# Patient Record
Sex: Female | Born: 1955 | Race: White | Hispanic: No
Health system: Midwestern US, Community
[De-identification: ages and names within clinical notes are randomized; demographics above are authoritative.]

## PROBLEM LIST (undated history)

## (undated) DIAGNOSIS — Z8711 Personal history of peptic ulcer disease: Secondary | ICD-10-CM

## (undated) DIAGNOSIS — K651 Peritoneal abscess: Secondary | ICD-10-CM

## (undated) DIAGNOSIS — E1165 Type 2 diabetes mellitus with hyperglycemia: Secondary | ICD-10-CM

## (undated) DIAGNOSIS — L299 Pruritus, unspecified: Secondary | ICD-10-CM

## (undated) DIAGNOSIS — Z1239 Encounter for other screening for malignant neoplasm of breast: Secondary | ICD-10-CM

## (undated) DIAGNOSIS — E7800 Pure hypercholesterolemia, unspecified: Secondary | ICD-10-CM

## (undated) DIAGNOSIS — I152 Hypertension secondary to endocrine disorders: Secondary | ICD-10-CM

## (undated) DIAGNOSIS — M25562 Pain in left knee: Secondary | ICD-10-CM

## (undated) DIAGNOSIS — E1159 Type 2 diabetes mellitus with other circulatory complications: Secondary | ICD-10-CM

## (undated) DIAGNOSIS — Z794 Long term (current) use of insulin: Secondary | ICD-10-CM

## (undated) DIAGNOSIS — M1A0720 Idiopathic chronic gout, left ankle and foot, without tophus (tophi): Secondary | ICD-10-CM

## (undated) DIAGNOSIS — Z09 Encounter for follow-up examination after completed treatment for conditions other than malignant neoplasm: Secondary | ICD-10-CM

## (undated) DIAGNOSIS — M25561 Pain in right knee: Secondary | ICD-10-CM

## (undated) DIAGNOSIS — H6692 Otitis media, unspecified, left ear: Secondary | ICD-10-CM

## (undated) DIAGNOSIS — T8149XA Infection following a procedure, other surgical site, initial encounter: Secondary | ICD-10-CM

## (undated) DIAGNOSIS — Z1211 Encounter for screening for malignant neoplasm of colon: Principal | ICD-10-CM

## (undated) DIAGNOSIS — T7840XA Allergy, unspecified, initial encounter: Secondary | ICD-10-CM

## (undated) DIAGNOSIS — R109 Unspecified abdominal pain: Secondary | ICD-10-CM

## (undated) DIAGNOSIS — Z1231 Encounter for screening mammogram for malignant neoplasm of breast: Principal | ICD-10-CM

## (undated) MED ORDER — INSULIN NEEDLES (DISPOSABLE) 31 X 5/16"
31 gauge x 5/16" | PACK | Freq: Every day | Status: DC
Start: ? — End: 2017-05-11

## (undated) MED ORDER — TRIAMTERENE-HYDROCHLOROTHIAZIDE 37.5 MG-25 MG TAB
ORAL_TABLET | ORAL | Status: DC
Start: ? — End: 2013-06-22

## (undated) MED ORDER — LOSARTAN 50 MG TAB
50 mg | ORAL_TABLET | ORAL | Status: DC
Start: ? — End: 2013-10-13

## (undated) MED ORDER — HUMULIN N NPH U-100 INSULIN KWIKPEN 100 UNIT/ML (3 ML) SUBCUTANEOUS
100 unit/mL (3 mL) | PACK | SUBCUTANEOUS | Status: DC
Start: ? — End: 2013-11-25

## (undated) MED ORDER — FUROSEMIDE 40 MG TAB
40 mg | ORAL_TABLET | ORAL | Status: DC
Start: ? — End: 2016-10-29

## (undated) MED ORDER — HUMULIN N PEN 100 UNIT/ML (3 ML) SUBCUTANEOUS
100 unit/mL (3 mL) | INJECTION | SUBCUTANEOUS | Status: DC
Start: ? — End: 2012-08-05

## (undated) MED ORDER — LIDOCAINE 5 % (700 MG/PATCH) ADHESIVE PATCH
5 % | PACK | Freq: Two times a day (BID) | CUTANEOUS | Status: DC
Start: ? — End: 2017-05-11

## (undated) MED ORDER — SERTRALINE 100 MG TAB
100 mg | ORAL_TABLET | ORAL | Status: DC
Start: ? — End: 2013-09-30

## (undated) MED ORDER — CYMBALTA 60 MG CAPSULE,DELAYED RELEASE
60 mg | ORAL_CAPSULE | ORAL | Status: DC
Start: ? — End: 2013-01-24

## (undated) MED ORDER — SIMVASTATIN 40 MG TAB
40 mg | ORAL_TABLET | Freq: Every evening | ORAL | Status: DC
Start: ? — End: 2014-06-21

## (undated) MED ORDER — HUMULIN N PEN 100 UNIT/ML (3 ML) SUBCUTANEOUS
100 unit/mL (3 mL) | INJECTION | SUBCUTANEOUS | Status: DC
Start: ? — End: 2012-06-14

## (undated) MED ORDER — METFORMIN SR 500 MG 24 HR TABLET
500 mg | ORAL_TABLET | ORAL | Status: DC
Start: ? — End: 2012-06-27

## (undated) MED ORDER — HYDROXYZINE 50 MG TAB
50 mg | ORAL_TABLET | Freq: Four times a day (QID) | ORAL | Status: AC | PRN
Start: ? — End: 2012-12-01

## (undated) MED ORDER — ONETOUCH ULTRA TEST STRIPS
PACK | Status: DC
Start: ? — End: 2017-05-11

## (undated) MED ORDER — METFORMIN SR 500 MG 24 HR TABLET
500 mg | ORAL_TABLET | ORAL | Status: DC
Start: ? — End: 2014-12-11

## (undated) MED ORDER — HUMULIN N PEN 100 UNIT/ML (3 ML) SUBCUTANEOUS
100 unit/mL (3 mL) | INJECTION | SUBCUTANEOUS | Status: DC
Start: ? — End: 2013-06-05

## (undated) MED ORDER — LIDOCAINE (PF) 10 MG/ML (1 %) IJ SOLN
10 mg/mL (1 %) | Freq: Once | INTRAMUSCULAR | Status: AC
Start: ? — End: 2013-05-02

## (undated) MED ORDER — TRIAMTERENE-HYDROCHLOROTHIAZIDE 37.5 MG-25 MG TAB
ORAL_TABLET | ORAL | Status: DC
Start: ? — End: 2013-12-05

## (undated) MED ORDER — FUROSEMIDE 40 MG TAB
40 mg | ORAL_TABLET | ORAL | Status: DC
Start: ? — End: 2013-05-07

## (undated) MED ORDER — SERTRALINE 100 MG TAB
100 mg | ORAL_TABLET | Freq: Every day | ORAL | Status: DC
Start: ? — End: 2013-05-28

## (undated) MED ORDER — SERTRALINE 100 MG TAB
100 mg | ORAL_TABLET | ORAL | Status: DC
Start: ? — End: 2014-05-08

## (undated) MED ORDER — METFORMIN SR 500 MG 24 HR TABLET
500 mg | ORAL_TABLET | ORAL | Status: DC
Start: ? — End: 2013-06-22

---

## 2010-02-13 NOTE — Telephone Encounter (Signed)
Pt calls with sl.sore throat, cough-nonproductive wanted abx b/c she  Thinks she has what her partner has.  She has no chest pain sob or fever.  I rec- otc meds for nex 24 hrs to see if she gets better.

## 2010-02-13 NOTE — Telephone Encounter (Signed)
Please call in meds   She can best be reach at [phone]

## 2010-02-13 NOTE — Telephone Encounter (Signed)
Pt has same symptoms as diane hoover. Cough, congestion head etc .she wants med.  DOB0 Jan 19, 2056  [id number] it is the one on [address].

## 2010-08-17 NOTE — Telephone Encounter (Signed)
Message copied by Sudie Grumbling on Mon Aug 17, 2010 10:56 AM  ------       Message from: Sabino Donovan       Created: Mon Aug 17, 2010  8:56 AM       Regarding: speak to nurse       Contact: [phone]         AHMAYA OSTERMILLER is calling to speak to nurse.        # [phone]

## 2010-08-17 NOTE — Telephone Encounter (Signed)
Spoke with patient she will come in this morning

## 2010-08-19 MED ORDER — LIDOCAINE HCL 1 % (10 MG/ML) IJ SOLN
10 mg/mL (1 %) | Freq: Once | INTRAMUSCULAR | Status: AC
Start: 2010-08-19 — End: 2010-08-19

## 2010-08-19 NOTE — Progress Notes (Signed)
Dana [name] is a 54 y.o. female and presents with Chief Complaint   Patient presents with   ??? Wrist Pain     .started having wrist pain a few months ago- does not want to see ortho or do surgery. Also has sinus infection-         Current outpatient prescriptions   Medication Sig Dispense Refill   ??? losartan (COZAAR) 50 mg tablet Take  by mouth daily.       ??? simvastatin (ZOCOR) 20 mg tablet Take  by mouth nightly.       ??? triamterene-hydrochlorothiazide (DYAZIDE) 37.5-25 mg per capsule Take  by mouth daily.       ??? sertraline (ZOLOFT) 100 mg tablet Take 150 mg by mouth daily.       ??? potassium chloride SR (KLOR-CON 10) 10 mEq tablet Take 20 mEq by mouth two (2) times a day.       ??? lidocaine (XYLOCAINE) 10 mg/mL (1 %) injection 1 mL by Intra artICUlar route once for 1 dose.  1 mL  0       Allergies not on file  Past Medical History   Diagnosis Date   ??? HTN (hypertension)    ??? Hypercholesterolemia    ??? GERD (gastroesophageal reflux disease)      Past Surgical History   Procedure Date   ??? Hx hysterectomy      No family history on file.  History   Substance Use Topics   ??? Smoking status: Not on file   ??? Smokeless tobacco: Not on file   ??? Alcohol Use: Not on file            Objective:  There were no vitals taken for this visit.  wdwn 54 yo wf  In NAD. A&O.  HEENT -- Pupils round.  O/P Clear.  Neck -- Supple. No JVD.  Heart -- RRR. No R/M/G.  Lungs -- coarse breath sounds  Abdomen -- Soft. Non-tender. Non-distended. No masses. Bowel sounds present.  Extremeties -- No edema.    Right wrist + tinel's tender over carpal tunnel    Assessment/Plan:  1. Carpal tunnel syndrome (354.0)  TRIAMCINOLONE ACETONIDE INJ, lidocaine (XYLOCAINE) 10 mg/mL (1 %) injection, DRAIN/INJECT SMALL JOINT/BURSA   2. Sinusitis (473.9G)             Author:  Lake Bells, MD 08/20/2010 8:25 AM   Indications:   Diagnostic    Procedure:   After consent was obtained, using sterile technique the right wrist joint was prepped using Betadine. Local anesthetic used: 1% lidocaine.. The joint was entered     Kenalog 15 mg was mixed with 1% lidocaine 1/2 ml  and injected into the joint and the needle withdrawn.  The procedure was well tolerated.  The patient is asked to continue to rest the joint for a few more days before resuming regular activities.  It may be more painful for the first 1-2 days.  Watch for fever, or increased swelling or persistent pain in the joint. Call or return to clinic prn if such symptoms occur or there is failure to improve as anticipated.

## 2010-09-10 LAB — AMB POC COMPLETE CBC,AUTOMATED ENTER
ABS. LYMPHS (POC): 1.7 10*3/uL (ref 1.2–3.4)
HCT (POC): 43 % (ref 35.0–60.0)
HGB (POC): 14.4 g/dL (ref 12.0–16.0)
LYMPHOCYTES (POC): 28.1 % (ref 20.5–51.1)
MCH (POC): 32.1 pg — AB (ref 27.0–31.0)
MCHC (POC): 33.6 g/dL (ref 33.0–37.0)
MCV (POC): 95.6 fL (ref 80.0–99.9)
PLATELET (POC): 223 10*3/uL (ref 150–450)
RBC (POC): 4.5 M/uL (ref 4.00–6.00)
WBC (POC): 6.2 10*3/uL (ref 4.5–10.5)

## 2010-09-10 LAB — AMB POC URINALYSIS DIP STICK AUTO W/ MICRO
Bilirubin (UA POC): NEGATIVE
Glucose (UA POC): NEGATIVE
Ketones (UA POC): NEGATIVE
Leukocyte esterase (UA POC): NEGATIVE
Nitrites (UA POC): NEGATIVE
Protein (UA POC): NEGATIVE mg/dL
Specific gravity (UA POC): 1.02 (ref 1.001–1.035)
Urobilinogen (UA POC): 0.2
pH (UA POC): 6 (ref 4.6–8.0)

## 2010-09-10 MED ORDER — AZITHROMYCIN 250 MG TAB
250 mg | ORAL_TABLET | ORAL | Status: AC
Start: 2010-09-10 — End: 2010-09-15

## 2010-09-10 NOTE — Patient Instructions (Signed)
MyChart Activation    Thank you for requesting access to MyChart. Please follow the instructions below to securely access and download your online medical record. MyChart allows you to send messages to your doctor, view your test results, renew your prescriptions, schedule appointments, and more.    How Do I Sign Up?    1. In your internet browser, go to [email or web address].  2. Click on the First Time User? Click Here link in the Sign In box. You will see the New Member Sign Up page.  3. Enter your MyChart Access Code exactly as it appears below. You will not need to use this code after you???ve completed the sign-up process. If you do not sign up before the expiration date, you must request a new code.    MyChart Access Code: [email or web address]  Expires: 12/09/10 10:49 AM (This is the date your MyChart access code will expire)    4. Enter the last four digits of your Social Security Number (xxxx) and Date of Birth (mm/dd/yyyy) as indicated and click Submit. You will be taken to the next sign-up page.  5. Create a MyChart ID. This will be your MyChart login ID and cannot be changed, so think of one that is secure and easy to remember.  6. Create a MyChart password. You can change your password at any time.  7. Enter your Password Reset Question and Answer. This can be used at a later time if you forget your password.   8. Enter your e-mail address. You will receive e-mail notification when new information is available in MyChart.  9. Click Sign Up. You can now view and download portions of your medical record.  10. Click the Download Summary menu link to download a portable copy of your medical information.    Additional Information    If you have questions, please call [phone]. Remember, MyChart is NOT to be used for urgent needs. For medical emergencies, dial 911.      Results for orders placed in visit on 09/10/10   AMB POC COMPLETE CBC,AUTOMATED ENTER   Component Value Range    ??? WBC COUNT 6.2  4.5 - 10.5 (K/uL)   ??? RBC 4.50  4.00 - 6.00 (M/uL)   ??? HGB 14.4  12.0 - 16.0 (g/dL)   ??? HCT 43.0  35.0 - 60.0 (%)   ??? MCV 95.6  80.0 - 99.9 (fL)   ??? MCH 32.1 (*) 27.0 - 31.0 (pg)   ??? MCHC 33.6  33.0 - 37.0 (g/dL)   ??? PLATELET 223  150 - 450 (K/uL)   ??? LYMPHOCYTE % 28.1  20.5 - 51.1 (%)   ??? LYMPHOCYTE # 1.7  1.2 - 3.4 (K/uL)   AMB POC URINALYSIS DIP STICK AUTO W/ MICRO   Component Value Range   ??? Color Yellow     ??? Clarity Clear     ??? Glucose Negative   > Negative    ??? Bilirubin Negative   > Negative    ??? Ketones Negative   > Negative    ??? Spec.Grav. 1.020  1.001 - 1.035    ??? Blood 2+   > Negative    ??? pH 6.0  4.6 - 8.0    ??? Protein neg   > Negative (mg/dL)   ??? Urobilinogen 0.2 mg/dL     ??? Nitrites Negative   > Negative    ??? Leukocyte esterase Negative   > Negative

## 2010-09-10 NOTE — Progress Notes (Signed)
History and Physical    Dana [name] is a 55 y.o. female presents for physical exam.  Back of neck very sore,- even cold air  Hurts it.  Right ear hurting, feels snotty    Past Medical History   Diagnosis Date   ??? HTN (hypertension)    ??? Hypercholesterolemia    ??? GERD (gastroesophageal reflux disease)        Past Surgical History   Procedure Date   ??? Hx hysterectomy        Current outpatient prescriptions   Medication Sig Dispense Refill   ??? CITALOPRAM HYDROBROMIDE (CELEXA PO) Take  by mouth.       ??? potassium chloride SA (MICRO-K) 10 mEq capsule Take 10 mEq by mouth daily.       ??? azithromycin (ZITHROMAX) 250 mg tablet Take  by mouth for 5 days. Take two tablets today then one tablet daily  6 Tab  0   ??? losartan (COZAAR) 50 mg tablet Take  by mouth daily.       ??? simvastatin (ZOCOR) 20 mg tablet Take  by mouth nightly.       ??? triamterene-hydrochlorothiazide (DYAZIDE) 37.5-25 mg per capsule Take  by mouth daily.       ??? sertraline (ZOLOFT) 100 mg tablet Take 150 mg by mouth daily.           Allergies   Allergen Reactions   ??? Dolobid (Diflunisal) Itching     Itching,swelling   ??? Adhesive Itching       History   Social History   ??? Marital Status: Single     Spouse Name: N/A     Number of Children: N/A   ??? Years of Education: N/A   Social History Main Topics   ??? Smoking status: Current Everyday Smoker   ??? Smokeless tobacco: Never Used   ??? Alcohol Use: Not on file   ??? Drug Use: Not on file   ??? Sexually Active: Not on file   Other Topics Concern   ??? Not on file   Social History Narrative   ??? No narrative on file       No family history on file.    Review of Systems:  Denies dysphagia, chest pain, or SOB.  No nausea or vomiting, or diarrhea or constipation.  No fever, chills, night sweats, or cough.  No dsyuria, frequency or abdominal pain.  No headaches.  Mamm? Colon-done by Dr. Christy Gentles . Pap done by gyn    Yearly eye exam:   yes  Yearly dental exam: yes    Objective   BP 120/80   Ht 5' 4.5" (1.638 m)   Wt 250 lb (113.399 kg)   BMI 42.25 kg/m2  General appearance - well developed, well nourished 55 y.o.wf- nad  Eyes - PERRL rt tm occluded,left intact  Neck --Supple, no anterior cervical nodes, no thyromegaly.no bruits, lateral neck muscles tender on palpaiton  Lungs --CTA  Heart - Regular rate and rhytm  Abdomen - soft, no tenderness or distention  Breasts - no masses  Extremities - no clubbing, cyanosis or edema  Skin- seborrheic keratosis rt breast    Results for orders placed in visit on 09/10/10   AMB POC COMPLETE CBC,AUTOMATED ENTER   Component Value Range   ??? WBC COUNT 6.2  4.5 - 10.5 (K/uL)   ??? RBC 4.50  4.00 - 6.00 (M/uL)   ??? HGB 14.4  12.0 - 16.0 (g/dL)   ??? HCT 43.0  35.0 - 60.0 (%)   ??? MCV 95.6  80.0 - 99.9 (fL)   ??? MCH 32.1 (*) 27.0 - 31.0 (pg)   ??? MCHC 33.6  33.0 - 37.0 (g/dL)   ??? PLATELET 223  150 - 450 (K/uL)   ??? LYMPHOCYTE % 28.1  20.5 - 51.1 (%)   ??? LYMPHOCYTE # 1.7  1.2 - 3.4 (K/uL)   AMB POC URINALYSIS DIP STICK AUTO W/ MICRO   Component Value Range   ??? Color Yellow     ??? Clarity Clear     ??? Glucose Negative   > Negative    ??? Bilirubin Negative   > Negative    ??? Ketones Negative   > Negative    ??? Spec.Grav. 1.020  1.001 - 1.035    ??? Blood 2+   > Negative    ??? pH 6.0  4.6 - 8.0    ??? Protein neg   > Negative (mg/dL)   ??? Urobilinogen 0.2 mg/dL     ??? Nitrites Negative   > Negative    ??? Leukocyte esterase Negative   > Negative          Assessment/Plan    1. Routine general medical examination at a health care facility (V70.0)  XR CHEST PA AND LATERAL, AMB POC COMPLETE CBC,AUTOMATED ENTER, LIPID PANEL, METABOLIC PANEL, COMPREHENSIVE, TSH, 3RD GENERATION, INFLUENZA VIRUS VACCINE, SPLIT, IN INDIVIDS. >=3 YRS OF AGE, IM, AMB POC URINALYSIS DIP STICK AUTO W/ MICRO   2. Hematuria (599.36F)  CULTURE, URINE   3. Sinusitis (473.9G)  azithromycin (ZITHROMAX) 250 mg tablet    4. Evaluation of hearing impairment (V72.19V)  REFERRAL TO ENT-OTOLARYNGOLOGY, PR PURE TONE HEARING TEST, AIR     Stop celexa- start cymbalta -given samples of 30 and 60 mg. Start 30 mg increase to bid then take 60 ,mg  Hearing eval  If urine culture neg- send to urology for eval for hematuria  Pt given zpack to take if not better try allegra and mucinex     AUTHOR:  Lake Bells, MD 09/10/2010 9:55 AM

## 2010-09-12 LAB — CULTURE, URINE
RESULT 1, 997131: NO GROWTH
Result 1: NO GROWTH

## 2010-09-14 LAB — REQUEST PROBLEM

## 2010-09-14 LAB — METABOLIC PANEL, COMPREHENSIVE

## 2010-09-14 LAB — LIPID PANEL

## 2010-09-14 LAB — TSH 3RD GENERATION: TSH: 1.5 u[IU]/mL (ref 0.450–4.500)

## 2010-10-01 MED ORDER — DULOXETINE 60 MG CAP, DELAYED RELEASE
60 mg | ORAL_CAPSULE | Freq: Every day | ORAL | Status: DC
Start: 2010-10-01 — End: 2011-05-30

## 2010-10-01 NOTE — Telephone Encounter (Signed)
Dana [name]  called and needs a new  prescription for Cymbalta 60 mg # thirty sig one a day with refills. Pharmacy is [facility] located @ [address] # is [phone].  Stated it works-was switched from Celexa. Her ph # is [phone].

## 2010-10-01 NOTE — Telephone Encounter (Signed)
E-Escript Cymbalta to Pharmacy for patient

## 2010-11-02 NOTE — Telephone Encounter (Signed)
STARLEEN TRUSSELL would like an appt for an URI.  # [phone]

## 2010-11-02 NOTE — Telephone Encounter (Signed)
Wants an app for URI given 2;30 11/03/10

## 2010-11-03 LAB — AMB POC COMPLETE CBC,AUTOMATED ENTER
ABS. LYMPHS (POC): 1.7 10*3/uL (ref 1.2–3.4)
HCT (POC): 46.4 % (ref 35.0–60.0)
HGB (POC): 15.7 g/dL (ref 12.0–16.0)
LYMPHOCYTES (POC): 31.2 % (ref 20.5–51.1)
MCH (POC): 32.3 pg — AB (ref 27.0–31.0)
MCHC (POC): 33.8 g/dL (ref 33.0–37.0)
MCV (POC): 95.6 fL (ref 80.0–99.9)
PLATELET (POC): 199 10*3/uL (ref 150–450)
RBC (POC): 4.85 M/uL (ref 4.00–6.00)
WBC (POC): 5.4 10*3/uL (ref 4.5–10.5)

## 2010-11-03 MED ORDER — HYDROCODONE 10 MG-CHLORPHENIRAMINE 8 MG/5 ML ORAL SUSP EXTEND.REL 12HR
10-8 mg/5 mL | Freq: Two times a day (BID) | ORAL | Status: DC | PRN
Start: 2010-11-03 — End: 2013-01-24

## 2010-11-03 MED ORDER — AZITHROMYCIN 250 MG TAB
250 mg | ORAL_TABLET | ORAL | Status: AC
Start: 2010-11-03 — End: 2010-11-08

## 2010-11-03 NOTE — Patient Instructions (Signed)
MyChart Activation    Thank you for requesting access to MyChart. Please follow the instructions below to securely access and download your online medical record. MyChart allows you to send messages to your doctor, view your test results, renew your prescriptions, schedule appointments, and more.    How Do I Sign Up?    1. In your internet browser, go to [email or web address]  2. Click on the First Time User? Click Here link in the Sign In box. You will be redirect to the New Member Sign Up page.  3. Enter your MyChart Access Code exactly as it appears below. You will not need to use this code after you???ve completed the sign-up process. If you do not sign up before the expiration date, you must request a new code.    MyChart Access Code: [email or web address]  Expires: 12/09/2010 10:49 AM (This is the date your MyChart access code will expire)    4. Enter the last four digits of your Social Security Number (xxxx) and Date of Birth (mm/dd/yyyy) as indicated and click Submit. You will be taken to the next sign-up page.  5. Create a MyChart ID. This will be your MyChart login ID and cannot be changed, so think of one that is secure and easy to remember.  6. Create a MyChart password. You can change your password at any time.  7. Enter your Password Reset Question and Answer. This can be used at a later time if you forget your password.   8. Enter your e-mail address. You will receive e-mail notification when new information is available in MyChart.  9. Click Sign Up. You can now view and download portions of your medical record.  10. Click the Download Summary menu link to download a portable copy of your medical information.    Additional Information    If you have questions, please call [phone]. Remember, MyChart is NOT to be used for urgent needs. For medical emergencies, dial 911.      mucinex twice a day,  tussionex at night  Jet nebs every 4 hours while awake  Call if worse  zpack

## 2010-11-03 NOTE — Progress Notes (Signed)
Dana [name] is a 55 y.o. female and presents with   Chief Complaint   Patient presents with   ??? Sinus Infection   .went to concert in [place] on weekend, started feeling badly sun pm. Cough prod of phelgm.  No chest pain or sob. Temp up to? Sweats.  Scratchy throat.feels bad.  Coughing so she can not sleep      Current Outpatient Prescriptions   Medication Sig Dispense Refill   ??? Cholecalciferol, Vitamin D3, (VITAMIN D) 5,000 unit Tab Take  by mouth.         ??? azithromycin (ZITHROMAX) 250 mg tablet Take two tablets today then one tablet daily  6 Tab  0   ??? chlorpheniramine-HYDROcodone (TUSSIONEX) 8-10 mg/5 mL suspension Take 5 mL by mouth every twelve (12) hours as needed for Cough.  120 mL  0   ??? DULoxetine (CYMBALTA) 60 mg capsule Take 1 Cap by mouth daily for 90 days.  30 Cap  6   ??? potassium chloride SA (MICRO-K) 10 mEq capsule Take 10 mEq by mouth daily.       ??? losartan (COZAAR) 50 mg tablet Take  by mouth daily.       ??? simvastatin (ZOCOR) 20 mg tablet Take  by mouth nightly.       ??? triamterene-hydrochlorothiazide (DYAZIDE) 37.5-25 mg per capsule Take  by mouth daily.         Allergies   Allergen Reactions   ??? Adhesive Itching   ??? Dolobid (Diflunisal) Itching     Itching,swelling     Past Medical History   Diagnosis Date   ??? HTN (hypertension)    ??? Hypercholesterolemia    ??? GERD (gastroesophageal reflux disease)      Past Surgical History   Procedure Date   ??? Hx hysterectomy      No family history on file.  History   Substance Use Topics   ??? Smoking status: Current Everyday Smoker   ??? Smokeless tobacco: Never Used   ??? Alcohol Use: Not on file          Objective:  BP 90/60   Temp 97.9 ??F (36.6 ??C)   Ht 5' 4.5" (1.638 m)   Wt 250 lb (113.399 kg)   BMI 42.25 kg/m2  wdwn 55 yo wf-  In NAD. A&O.  HEENT -- Pupils round.  O/P Clear.tm- occluded with cerumen  Neck -- Supple. No JVD.no acn  Heart -- RRR. No R/M/G.  Lungs -- Coarse bs and wheezing   Abdomen -- Soft. Non-tender. Non-distended. No masses. Bowel sounds present.  Extremeties -- No edema.      Results for orders placed in visit on 11/03/10   AMB POC COMPLETE CBC,AUTOMATED ENTER       Component Value Range    WBC COUNT 5.4  4.5 - 10.5 (K/uL)    RBC 4.85  4.00 - 6.00 (M/uL)    HGB 15.7  12.0 - 16.0 (g/dL)    HCT 16.1  09.6 - 04.5 (%)    MCV 95.6  80.0 - 99.9 (fL)    MCH 32.3 (*) 27.0 - 31.0 (pg)    MCHC 33.8  33.0 - 37.0 (g/dL)    PLATELET 409  811 - 450 (K/uL)    LYMPHOCYTE % 31.2  20.5 - 51.1 (%)    LYMPHOCYTE # 1.7  1.2 - 3.4 (K/uL)       Assessment/Plan:  1. Sinus infection  AMB POC COMPLETE CBC,AUTOMATED ENTER, azithromycin (ZITHROMAX) 250  mg tablet, chlorpheniramine-HYDROcodone (TUSSIONEX) 8-10 mg/5 mL suspension   jet nebs q4h wa        Author:  Lake Bells, MD 11/03/2010 2:19 PM

## 2010-11-30 MED ORDER — HYDROXYZINE 25 MG TAB
25 mg | ORAL | Status: AC
Start: 2010-11-30 — End: 2010-11-30
  Administered 2010-11-30: 21:00:00 via ORAL

## 2010-11-30 MED ORDER — PREDNISONE 20 MG TAB
20 mg | ORAL | Status: AC
Start: 2010-11-30 — End: 2010-11-30
  Administered 2010-11-30: 21:00:00 via ORAL

## 2010-11-30 MED ORDER — HYDROXYZINE 50 MG TAB
50 mg | ORAL_TABLET | Freq: Four times a day (QID) | ORAL | Status: DC | PRN
Start: 2010-11-30 — End: 2012-11-01

## 2010-11-30 MED ORDER — FAMOTIDINE 20 MG TAB
20 mg | ORAL | Status: AC
Start: 2010-11-30 — End: 2010-11-30
  Administered 2010-11-30: 21:00:00 via ORAL

## 2010-11-30 NOTE — ED Notes (Signed)
PA has reviewed discharge instructions with the patient.  The patient verbalized understanding.

## 2010-11-30 NOTE — ED Notes (Signed)
Triage: Pt in with head to toe itching that started 30 minutes ago. Pt using new salt substitute, is only thing that is different. Pt has taken 3 or 4 benadryls with minimal relief.

## 2010-12-07 NOTE — ED Provider Notes (Signed)
Patient is a 55 y.o. female presenting with skin problem. The history is provided by the patient.   Skin Problem   This is a new problem. The current episode started 3 to 5 hours ago. The problem has been gradually improving. Associated with: thinks a salt  There has been no fever. Affected Location: entire body  Associated symptoms include itching. Treatments tried: benadryl    Patient states that the itching started after using this salt. Pt states that she itches everywhere. She took 50 mg of benadryl before arrival. Pt denies any SOB, wheezing, sore throat, headache.      Past Medical History   Diagnosis Date   ??? HTN (hypertension)    ??? Hypercholesterolemia    ??? GERD (gastroesophageal reflux disease)         Past Surgical History   Procedure Date   ??? Hx hysterectomy          No family history on file.     History     Social History   ??? Marital Status: Single     Spouse Name: N/A     Number of Children: N/A   ??? Years of Education: N/A     Occupational History   ??? Not on file.     Social History Main Topics   ??? Smoking status: Current Everyday Smoker   ??? Smokeless tobacco: Never Used   ??? Alcohol Use: Not on file   ??? Drug Use: Not on file   ??? Sexually Active: Not on file     Other Topics Concern   ??? Not on file     Social History Narrative   ??? No narrative on file                  ALLERGIES: Adhesive and Dolobid      Review of Systems   Constitutional: Negative for fever and chills.   HENT: Negative for neck pain.    Respiratory: Negative for chest tightness, shortness of breath and wheezing.    Cardiovascular: Negative for chest pain and palpitations.   Gastrointestinal: Negative for vomiting, abdominal pain and diarrhea.   Skin: Positive for itching. Negative for rash.   Neurological: Negative for dizziness and headaches.   All other systems reviewed and are negative.        Filed Vitals:    11/30/10 1516 11/30/10 1700   BP: 116/75 125/80   Pulse: 104 100   Temp: 97.5 ??F (36.4 ??C)    Resp: 20 18    Height: 5\' 4"  (1.626 m)    Weight: 248 lb (112.492 kg)    SpO2: 94% 100%            Physical Exam   Nursing note and vitals reviewed.  Constitutional: She is oriented to person, place, and time. She appears well-developed and well-nourished. No distress.   HENT:   Head: Normocephalic and atraumatic.   Right Ear: External ear normal.   Left Ear: External ear normal.   Nose: Nose normal.   Mouth/Throat: Oropharynx is clear and moist. No oropharyngeal exudate.   Eyes: Conjunctivae and EOM are normal. Pupils are equal, round, and reactive to light. Right eye exhibits no discharge. Left eye exhibits no discharge. No scleral icterus.   Neck: Normal range of motion. Neck supple.   Cardiovascular: Normal rate, regular rhythm, normal heart sounds and intact distal pulses.  Exam reveals no gallop and no friction rub.    No murmur heard.  Pulmonary/Chest: Effort  normal. No respiratory distress.   Abdominal: Soft. Bowel sounds are normal. She exhibits no distension and no mass. There is no tenderness. There is no rebound and no guarding.   Musculoskeletal: Normal range of motion. She exhibits no edema.   Neurological: She is alert and oriented to person, place, and time. She has normal strength. No cranial nerve deficit. She exhibits normal muscle tone.   Skin: Skin is warm and dry. No rash noted. She is not diaphoretic.   Psychiatric: She has a normal mood and affect. Her behavior is normal.        MDM    Procedures    Progress Note:  Pt is feeling much better at this time. Will send her home with steroids and benadryl. Ulyess Blossom, PA-C       Patient's results have been reviewed with them.  Patient and/or family have verbally conveyed their understanding and agreement of the patient's signs, symptoms, diagnosis, treatment and prognosis and additionally agree to follow up as recommended or return to the Emergency Room should their condition change prior to follow-up.  Discharge instructions have also been provided to the patient with some educational information regarding their diagnosis as well a list of reasons why they would want to return to the ER prior to their follow-up appointment should their condition change.

## 2010-12-07 NOTE — ED Provider Notes (Signed)
I was personally available for consultation in the emergency department.  I have reviewed the chart and agree with the documentation recorded by the MLP, including the assessment, treatment plan, and disposition.  Kamila Broda P Emmabelle Fear, MD

## 2010-12-08 MED ORDER — TRIAMTERENE-HYDROCHLOROTHIAZIDE 37.5 MG-25 MG TAB
ORAL_TABLET | ORAL | Status: DC
Start: 2010-12-08 — End: 2011-04-16

## 2011-01-04 MED ORDER — SIMVASTATIN 10 MG TAB
10 mg | ORAL_TABLET | ORAL | Status: DC
Start: 2011-01-04 — End: 2013-06-22

## 2011-03-29 NOTE — Telephone Encounter (Signed)
Patient called wanting to know what Allergy doctor to call,  Referred to Dr. Molly Maduro Call

## 2011-03-29 NOTE — Telephone Encounter (Signed)
Dana [name] would like to speak with dr or nurse re probably a referral to an allergist; having problem with itching all over. Ph # is [phone].

## 2011-04-16 LAB — AMB POC URINALYSIS DIP STICK AUTO W/ MICRO
Bilirubin (UA POC): NEGATIVE
Glucose (UA POC): NEGATIVE
Ketones (UA POC): NEGATIVE
Leukocyte esterase (UA POC): NEGATIVE
Nitrites (UA POC): NEGATIVE
Protein (UA POC): NEGATIVE mg/dL
Specific gravity (UA POC): 1.02 (ref 1.001–1.035)
Urobilinogen (UA POC): 0.2
pH (UA POC): 5.5 (ref 4.6–8.0)

## 2011-04-16 NOTE — Progress Notes (Signed)
Dana [name] is a 55 y.o. female and presents with   Chief Complaint   Patient presents with   ??? Follow-up   .thinks she should feel better than she does.  Had an allergic rxn a few months ago.- hands swollen and knees painful  Was itching all over- thought it was to "no salt"And anxietyattack- even filed nails down so she would not draw blood.  Has an appt 8/28 with allergist.  Since partner has been hospitalized- they have both stopped smoking. They have been eating better.  Have not gained weight.   She is retaining fluid.  And hands hurt.  Joints aching more than she used to.cymbalta was helping pain , but now not doing anything, no chest pain, headache , cough, fever or rash.  Wanted to know if I have checked her for lupus.      Current Outpatient Prescriptions   Medication Sig Dispense Refill   ??? DULoxetine (CYMBALTA) 60 mg capsule Take 60 mg by mouth daily.         ??? simvastatin (ZOCOR) 10 mg tablet TAKE 1 TABLET EVERY DAY  30 Tab  10   ??? hydrOXYzine (ATARAX) 50 mg tablet Take 1 Tab by mouth every six (6) hours as needed for Itching for 10 doses.  20 Tab  0   ??? Cholecalciferol, Vitamin D3, (VITAMIN D) 5,000 unit Tab Take  by mouth.         ??? chlorpheniramine-HYDROcodone (TUSSIONEX) 8-10 mg/5 mL suspension Take 5 mL by mouth every twelve (12) hours as needed for Cough.  120 mL  0   ??? potassium chloride SA (MICRO-K) 10 mEq capsule Take 10 mEq by mouth daily.       ??? losartan (COZAAR) 50 mg tablet Take  by mouth daily.       ??? simvastatin (ZOCOR) 20 mg tablet Take  by mouth nightly.       ??? triamterene-hydrochlorothiazide (DYAZIDE) 37.5-25 mg per capsule Take  by mouth daily.         Allergies   Allergen Reactions   ??? Adhesive Itching   ??? Dolobid (Diflunisal) Itching     Itching,swelling     Past Medical History   Diagnosis Date   ??? HTN (hypertension)    ??? Hypercholesterolemia    ??? GERD (gastroesophageal reflux disease)      Past Surgical History   Procedure Date   ??? Hx hysterectomy       No family history on file.  History   Substance Use Topics   ??? Smoking status: Current Everyday Smoker   ??? Smokeless tobacco: Never Used   ??? Alcohol Use: Not on file          Objective:  BP 140/90   Wt 255 lb (115.667 kg)  Heavy 56 yo wf-  In NAD. A&O.  HEENT -- Pupils round.  O/P Clear.  Neck -- Supple. No JVD.no tm  Heart -- RRR. No R/M/G.  Lungs -- CTA.  Abdomen -- Soft. Non-tender. Non-distended. No masses. Bowel sounds present.  Extremities -- No edema.   wrists- min synovitis   Results for orders placed in visit on 04/16/11   AMB POC URINALYSIS DIP STICK AUTO W/ MICRO       Component Value Range    Color Yellow  (none)     Clarity Clear  (none)     Glucose Negative  (none)     Bilirubin Negative  (none)     Ketones Negative  (  none)     Spec.Grav. 1.020  1.001 - 1.035     Blood 1+  (none)     pH 5.5  4.6 - 8.0     Protein neg  UJW:[id number](NF/AO)    Urobilinogen 0.2 mg/dL      Nitrites Negative  (none)     Leukocyte esterase Negative  (none)          Assessment/Plan:  1. Joint pain  PR COLLECTION VENOUS BLOOD,VENIPUNCTURE, METABOLIC PANEL, COMPREHENSIVE, SED RATE (ESR), ANA W/REFLEX IF POSITIVE, RHEUMATOID ARTHRITIS FACTOR, AMB POC URINALYSIS DIP STICK AUTO W/ MICRO   2. Hematuria  [place] RETROPERITONEUM COMP           Author:  Lake Bells, MD 04/16/2011 2:12 PM

## 2011-04-16 NOTE — Patient Instructions (Addendum)
MyChart Activation    Thank you for requesting access to MyChart. Please follow the instructions below to securely access and download your online medical record. MyChart allows you to send messages to your doctor, view your test results, renew your prescriptions, schedule appointments, and more.    How Do I Sign Up?    1. In your internet browser, go to [email or web address]  2. Click on the First Time User? Click Here link in the Sign In box. You will be redirect to the New Member Sign Up page.  3. Enter your MyChart Access Code exactly as it appears below. You will not need to use this code after you???ve completed the sign-up process. If you do not sign up before the expiration date, you must request a new code.    MyChart Access Code: Not generated  Current MyChart Status: Active (This is the date your MyChart access code will expire)    4. Enter the last four digits of your Social Security Number (xxxx) and Date of Birth (mm/dd/yyyy) as indicated and click Submit. You will be taken to the next sign-up page.  5. Create a MyChart ID. This will be your MyChart login ID and cannot be changed, so think of one that is secure and easy to remember.  6. Create a MyChart password. You can change your password at any time.  7. Enter your Password Reset Question and Answer. This can be used at a later time if you forget your password.   8. Enter your e-mail address. You will receive e-mail notification when new information is available in MyChart.  9. Click Sign Up. You can now view and download portions of your medical record.  10. Click the Download Summary menu link to download a portable copy of your medical information.    Additional Information    If you have questions, please visit the Frequently Asked Questions section of the MyChart website at [email or web address] Remember, MyChart is NOT to be used for urgent needs. For medical emergencies, dial 911.       Results for orders placed in visit on 11/03/10   AMB POC COMPLETE CBC,AUTOMATED ENTER       Component Value Range    WBC COUNT 5.4  4.5 - 10.5 (K/uL)    RBC 4.85  4.00 - 6.00 (M/uL)    HGB 15.7  12.0 - 16.0 (g/dL)    HCT 16.1  09.6 - 04.5 (%)    MCV 95.6  80.0 - 99.9 (fL)    MCH 32.3 (*) 27.0 - 31.0 (pg)    MCHC 33.8  33.0 - 37.0 (g/dL)    PLATELET 409  811 - 450 (K/uL)    LYMPHOCYTE % 31.2  20.5 - 51.1 (%)    LYMPHOCYTE # 1.7  1.2 - 3.4 (K/uL)

## 2011-04-17 LAB — METABOLIC PANEL, COMPREHENSIVE
A-G Ratio: 1.6 (ref 1.1–2.5)
ALT (SGPT): 28 IU/L (ref 0–40)
AST (SGOT): 17 IU/L (ref 0–40)
Albumin: 4.1 g/dL (ref 3.5–5.5)
Alk. phosphatase: 68 IU/L (ref 25–150)
BUN/Creatinine ratio: 26 — ABNORMAL HIGH (ref 9–23)
BUN: 17 mg/dL (ref 6–24)
Bilirubin, total: 0.4 mg/dL (ref 0.0–1.2)
CO2: 23 mmol/L (ref 20–32)
Calcium: 8.9 mg/dL (ref 8.7–10.2)
Chloride: 101 mmol/L (ref 97–108)
Creatinine: 0.66 mg/dL (ref 0.57–1.00)
GFR est non-AA: 101 mL/min/{1.73_m2} (ref >59)
GLOBULIN, TOTAL: 2.5 g/dL (ref 1.5–4.5)
Glucose: 146 mg/dL — ABNORMAL HIGH (ref 65–99)
Potassium: 3.6 mmol/L (ref 3.5–5.2)
Protein, total: 6.6 g/dL (ref 6.0–8.5)
Sodium: 140 mmol/L (ref 134–144)
eGFR If African American: 117 mL/min/{1.73_m2} (ref >59)

## 2011-04-17 LAB — RHEUMATOID ARTHRITIS FACTOR: Rheumatoid factor: 7.6 IU/mL (ref 0.0–13.9)

## 2011-04-17 LAB — ANA, DIRECT, W/REFLEX
ANA, DIRECT: NEGATIVE
Antinuclear Antibodies Direct: NEGATIVE

## 2011-04-17 LAB — SED RATE (ESR): Sed rate (ESR): 21 mm/hr (ref 0–40)

## 2011-04-23 NOTE — Progress Notes (Signed)
Quick Note:    Called patient and relayed doctor's message  ______

## 2011-04-23 NOTE — Progress Notes (Signed)
Quick Note:    pls call- labs show no lupus or rheumatoid arthrits. Blood sugar elevated.- 146 [place] of kidneys is fine. You can try stopping zocor for a few weeks to see if you feel better. Follow up one month fasting to manage blood sugar, decrease carbs and sweets until then.  ______

## 2011-05-30 MED ORDER — POTASSIUM CHLORIDE SR 10 MEQ CAP
10 mEq | ORAL_CAPSULE | ORAL | Status: DC
Start: 2011-05-30 — End: 2013-06-22

## 2011-05-30 MED ORDER — CYMBALTA 60 MG CAPSULE,DELAYED RELEASE
60 mg | ORAL_CAPSULE | ORAL | Status: DC
Start: 2011-05-30 — End: 2012-01-24

## 2011-05-30 MED ORDER — LOSARTAN 50 MG TAB
50 mg | ORAL_TABLET | ORAL | Status: DC
Start: 2011-05-30 — End: 2012-03-25

## 2011-10-19 MED ORDER — TRIAMTERENE-HYDROCHLOROTHIAZIDE 37.5 MG-25 MG TAB
ORAL_TABLET | ORAL | Status: DC
Start: 2011-10-19 — End: 2012-06-27

## 2012-01-24 MED ORDER — CYMBALTA 60 MG CAPSULE,DELAYED RELEASE
60 mg | ORAL_CAPSULE | ORAL | Status: DC
Start: 2012-01-24 — End: 2012-08-27

## 2012-03-25 MED ORDER — LOSARTAN 50 MG TAB
50 mg | ORAL_TABLET | ORAL | Status: DC
Start: 2012-03-25 — End: 2012-06-27

## 2012-03-29 NOTE — Telephone Encounter (Signed)
Called in Ambien 5mg   1hs prn sleep #10  No  Refills,   Patient having trouble sleeping, leg cramps, anxiety, lot going on a time

## 2012-03-31 NOTE — Telephone Encounter (Signed)
Message copied by Sudie Grumbling on Fri Mar 31, 2012  1:27 PM  ------       Message from: [place], [place] R       Created: Fri Mar 31, 2012 11:51 AM       Regarding: no message         EBONIQUE HALLSTROM is calling to speak to [name]; no message. Ph # is [phone].

## 2012-03-31 NOTE — Telephone Encounter (Signed)
Patient having leg cramps  Nothing helping  Well see patient Monday AM

## 2012-04-03 LAB — AMB POC URINALYSIS DIP STICK MANUAL W/O MICRO
Blood (UA POC): NEGATIVE
Leukocyte esterase (UA POC): NEGATIVE
Nitrites (UA POC): NEGATIVE
Protein (UA POC): NEGATIVE mg/dL
Specific gravity (UA POC): 1.02 (ref 1.001–1.035)
Urobilinogen (UA POC): 0.2 (ref 0.2–1)
pH (UA POC): 7 (ref 4.6–8.0)

## 2012-04-03 LAB — AMB POC COMPLETE CBC,AUTOMATED ENTER
ABS. LYMPHS (POC): 1.9 10*3/uL (ref 1.2–3.4)
HCT (POC): 49.8 % (ref 35.0–60.0)
HGB (POC): 16.3 g/dL — AB (ref 12.0–16.0)
LYMPHOCYTES (POC): 27.3 % (ref 20.5–51.1)
MCH (POC): 31.6 pg — AB (ref 27.0–31.0)
MCHC (POC): 32.7 g/dL — AB (ref 33.0–37.0)
MCV (POC): 96.6 fL (ref 80.0–99.9)
PLATELET (POC): 249 10*3/uL (ref 150–450)
RBC (POC): 5.15 M/uL (ref 4.00–6.00)
WBC (POC): 6.8 10*3/uL (ref 4.5–10.5)

## 2012-04-03 MED ORDER — LORAZEPAM 1 MG TAB
1 mg | ORAL_TABLET | ORAL | Status: DC | PRN
Start: 2012-04-03 — End: 2013-01-24

## 2012-04-03 NOTE — Patient Instructions (Signed)
Cut maxzide in half.  Plenty of fluids.  Results for orders placed in visit on 04/03/12   AMB POC URINALYSIS DIP STICK MANUAL W/O MICRO       Component Value Range    Color Yellow  (none)    Clarity Clear  (none)    Glucose 3+  (none)    Bilirubin 2+  (none)    Ketones 1+  (none)    Spec.Grav. 1.020  1.001 - 1.035    Blood Negative  (none)    pH 7.0  4.6 - 8.0    Protein, Urine Negative  Negative mg/dL    Urobilinogen 0.2 mg/dL  0.2 - 1    Nitrites Negative  (none)    Leukocyte esterase Negative  (none)   AMB POC COMPLETE CBC,AUTOMATED ENTER       Component Value Range    WBC (POC) 6.8  4.5 - 10.5 K/uL    RBC (POC) 5.15  4.00 - 6.00 M/uL    HGB (POC) 16.3 (*) 12.0 - 16.0 g/dL    HCT (POC) 98.1  19.1 - 60.0 %    MCV (POC) 96.6  80.0 - 99.9 fL    MCH (POC) 31.6 (*) 27.0 - 31.0 pg    MCHC (POC) 32.7 (*) 33.0 - 37.0 g/dL    PLATELET (POC) 478  150 - 450 K/uL    LYMPHOCYTES (POC) 27.3  20.5 - 51.1 %    ABS. LYMPHS (POC) 1.9  1.2 - 3.4 K/uL

## 2012-04-03 NOTE — Progress Notes (Signed)
Dana [name] is a 56 y.o. female and presents with   Chief Complaint   Patient presents with   ??? Leg Pain     leg cramps   .  Not sleeping at night due to "horrific" leg cramps.  occas gets  Cramps during the day.  ambien did not help the cramps. Or help  Her sleep.  Thinks she might have gotten dehydrated.. Now with hydration not any better.She has tried hydrocodone, methocarbamol, xanax- nothing has helped.        Current Outpatient Prescriptions   Medication Sig Dispense Refill   ??? losartan (COZAAR) 50 mg tablet TAKE 1 TABLET BY MOUTH EVERY DAY  90 Tab  2   ??? CYMBALTA 60 mg capsule TAKE ONE CAPSULE BY MOUTH EVERY DAY  30 Cap  6   ??? triamterene-hydrochlorothiazide (MAXZIDE) 37.5-25 mg per tablet TAKE 1 TABLET EVERY DAY  30 Tab  10   ??? potassium chloride SA (MICRO-K) 10 mEq capsule TAKE 1 CAPSULE EVERY DAY  90 Cap  2   ??? DULoxetine (CYMBALTA) 60 mg capsule Take 60 mg by mouth daily.         ??? simvastatin (ZOCOR) 10 mg tablet TAKE 1 TABLET EVERY DAY  30 Tab  10   ??? hydrOXYzine (ATARAX) 50 mg tablet Take 1 Tab by mouth every six (6) hours as needed for Itching for 10 doses.  20 Tab  0   ??? Cholecalciferol, Vitamin D3, (VITAMIN D) 5,000 unit Tab Take  by mouth.         ??? chlorpheniramine-HYDROcodone (TUSSIONEX) 8-10 mg/5 mL suspension Take 5 mL by mouth every twelve (12) hours as needed for Cough.  120 mL  0   ??? simvastatin (ZOCOR) 20 mg tablet Take  by mouth nightly.       ??? triamterene-hydrochlorothiazide (DYAZIDE) 37.5-25 mg per capsule Take  by mouth daily.         Allergies   Allergen Reactions   ??? Adhesive Itching   ??? Dolobid (Diflunisal) Itching     Itching,swelling     Past Medical History   Diagnosis Date   ??? HTN (hypertension)    ??? Hypercholesterolemia    ??? GERD (gastroesophageal reflux disease)      Past Surgical History   Procedure Date   ??? Hx hysterectomy      No family history on file.  History   Substance Use Topics   ??? Smoking status: Current Everyday Smoker   ??? Smokeless tobacco: Never Used   ???  Alcohol Use: Not on file          Objective:  BP 120/80   Ht 5\' 4"  (1.626 m)   Wt 235 lb (106.595 kg)   BMI 40.34 kg/m2  wdwn 56 yo wf  In NAD. A&O.  HEENT -- Pupils round.  O/P Clear.  Neck -- Supple. No JVD.  Heart -- RRR. No R/M/G.  Lungs -- CTA.  Abdomen -- Soft. Non-tender. Non-distended. No masses. Bowel sounds present.  Extremities -- No edema.     Results for orders placed in visit on 04/03/12   AMB POC URINALYSIS DIP STICK MANUAL W/O MICRO       Component Value Range    Color Yellow  (none)    Clarity Clear  (none)    Glucose 3+  (none)    Bilirubin 2+  (none)    Ketones 1+  (none)    Spec.Grav. 1.020  1.001 - 1.035  Blood Negative  (none)    pH 7.0  4.6 - 8.0    Protein, Urine Negative  Negative mg/dL    Urobilinogen 0.2 mg/dL  0.2 - 1    Nitrites Negative  (none)    Leukocyte esterase Negative  (none)   AMB POC COMPLETE CBC,AUTOMATED ENTER       Component Value Range    WBC (POC) 6.8  4.5 - 10.5 K/uL    RBC (POC) 5.15  4.00 - 6.00 M/uL    HGB (POC) 16.3 (*) 12.0 - 16.0 g/dL    HCT (POC) 82.9  56.2 - 60.0 %    MCV (POC) 96.6  80.0 - 99.9 fL    MCH (POC) 31.6 (*) 27.0 - 31.0 pg    MCHC (POC) 32.7 (*) 33.0 - 37.0 g/dL    PLATELET (POC) 130  150 - 450 K/uL    LYMPHOCYTES (POC) 27.3  20.5 - 51.1 %    ABS. LYMPHS (POC) 1.9  1.2 - 3.4 K/uL          Assessment/Plan:  1. Leg cramps  AMB POC URINALYSIS DIP STICK MANUAL W/O MICRO, AMB POC COMPLETE CBC,AUTOMATED ENTER   2. Hypercholesterolemia  PR COLLECTION VENOUS BLOOD,VENIPUNCTURE, METABOLIC PANEL, COMPREHENSIVE, TSH, 3RD GENERATION   3. HTN (hypertension)     4. Glucose intolerance (impaired glucose tolerance)  HEMOGLOBIN A1C     Cut maxzide in half,  Drink more tonic water. Try ativan. Call if not better      Author:  Lake Bells, MD 04/03/2012 10:29 AM

## 2012-04-04 LAB — METABOLIC PANEL, COMPREHENSIVE
A-G Ratio: 1.5 (ref 1.1–2.5)
ALT (SGPT): 37 IU/L — ABNORMAL HIGH (ref 0–32)
AST (SGOT): 17 IU/L (ref 0–40)
Albumin: 4.2 g/dL (ref 3.5–5.5)
Alk. phosphatase: 85 IU/L (ref 42–107)
BUN/Creatinine ratio: 22 (ref 9–23)
BUN: 15 mg/dL (ref 6–24)
Bilirubin, total: 0.7 mg/dL (ref 0.0–1.2)
CO2: 24 mmol/L (ref 19–28)
Calcium: 9.9 mg/dL (ref 8.7–10.2)
Chloride: 92 mmol/L — ABNORMAL LOW (ref 97–108)
Creatinine: 0.69 mg/dL (ref 0.57–1.00)
GFR est non-AA: 99 mL/min/{1.73_m2} (ref >59)
GLOBULIN, TOTAL: 2.8 g/dL (ref 1.5–4.5)
Glucose: 480 mg/dL — ABNORMAL HIGH (ref 65–99)
Potassium: 4.1 mmol/L (ref 3.5–5.2)
Protein, total: 7 g/dL (ref 6.0–8.5)
Sodium: 134 mmol/L (ref 134–144)
eGFR If African American: 114 mL/min/{1.73_m2} (ref >59)

## 2012-04-04 LAB — TSH 3RD GENERATION: TSH: 1.3 u[IU]/mL (ref 0.450–4.500)

## 2012-04-04 LAB — HEMOGLOBIN A1C WITH EAG: Hemoglobin A1c: 14.3 % — ABNORMAL HIGH (ref 4.8–5.6)

## 2012-04-05 LAB — AMB POC URINALYSIS DIP STICK MANUAL W/O MICRO
Bilirubin (UA POC): NEGATIVE
Leukocyte esterase (UA POC): NEGATIVE
Nitrites (UA POC): NEGATIVE
Protein (UA POC): NEGATIVE mg/dL
Specific gravity (UA POC): 1.015 (ref 1.001–1.035)
Urobilinogen (UA POC): 0.2 (ref 0.2–1)
pH (UA POC): 6.5 (ref 4.6–8.0)

## 2012-04-05 LAB — AMB POC GLUCOSE BLOOD, BY GLUCOSE MONITORING DEVICE: Glucose POC: 584 mg/dL — AB (ref 65–99)

## 2012-04-05 MED ORDER — METFORMIN SR 500 MG 24 HR TABLET
500 mg | ORAL_TABLET | Freq: Every day | ORAL | Status: DC
Start: 2012-04-05 — End: 2012-04-24

## 2012-04-05 NOTE — Patient Instructions (Addendum)
Take 10 units of nph at bedtime.  humalog sliding scale at meals   for blood sugar 200-250  2 units    251-300  4 units   301-350   6 units   351-400   8 units     Monitor blood sugars 4 times a day.  Call with blood sugars Friday.  And send blood sugars by mychart to [name] next week.  Results for orders placed in visit on 04/05/12   AMB POC URINALYSIS DIP STICK MANUAL W/O MICRO       Component Value Range    Color Yellow  (none)    Clarity Clear  (none)    Glucose 4+  (none)    Bilirubin Negative  (none)    Ketones Trace  (none)    Spec.Grav. 1.015  1.001 - 1.035    Blood Trace  (none)    pH 6.5  4.6 - 8.0    Protein, Urine Negative  Negative mg/dL    Urobilinogen 0.2 mg/dL  0.2 - 1    Nitrites Negative  (none)    Leukocyte esterase Negative  (none)   AMB POC GLUCOSE BLOOD, BY GLUCOSE MONITORING DEVICE       Component Value Range    Glucose POC 584 (*) 65 - 99 mg/dL

## 2012-04-05 NOTE — Progress Notes (Signed)
Dana [name] is a 56 y.o. female and presents with   Chief Complaint   Patient presents with   ??? Follow-up   .  Pt has had frequency of urination. Still having leg cramps. Feels dehydrated.      Current Outpatient Prescriptions   Medication Sig Dispense Refill   ??? metFORMIN ER (GLUCOPHAGE XR) 500 mg tablet Take 1 Tab by mouth daily (with dinner) for 180 days.  30 Tab  1   ??? LORazepam (ATIVAN) 1 mg tablet Take 1 Tab by mouth every four (4) hours as needed for Anxiety.  20 Tab  1   ??? losartan (COZAAR) 50 mg tablet TAKE 1 TABLET BY MOUTH EVERY DAY  90 Tab  2   ??? CYMBALTA 60 mg capsule TAKE ONE CAPSULE BY MOUTH EVERY DAY  30 Cap  6   ??? triamterene-hydrochlorothiazide (MAXZIDE) 37.5-25 mg per tablet TAKE 1 TABLET EVERY DAY  30 Tab  10   ??? potassium chloride SA (MICRO-K) 10 mEq capsule TAKE 1 CAPSULE EVERY DAY  90 Cap  2   ??? DULoxetine (CYMBALTA) 60 mg capsule Take 60 mg by mouth daily.         ??? simvastatin (ZOCOR) 10 mg tablet TAKE 1 TABLET EVERY DAY  30 Tab  10   ??? hydrOXYzine (ATARAX) 50 mg tablet Take 1 Tab by mouth every six (6) hours as needed for Itching for 10 doses.  20 Tab  0   ??? Cholecalciferol, Vitamin D3, (VITAMIN D) 5,000 unit Tab Take  by mouth.         ??? chlorpheniramine-HYDROcodone (TUSSIONEX) 8-10 mg/5 mL suspension Take 5 mL by mouth every twelve (12) hours as needed for Cough.  120 mL  0   ??? simvastatin (ZOCOR) 20 mg tablet Take  by mouth nightly.       ??? triamterene-hydrochlorothiazide (DYAZIDE) 37.5-25 mg per capsule Take  by mouth daily.         Allergies   Allergen Reactions   ??? Adhesive Itching   ??? Dolobid (Diflunisal) Itching     Itching,swelling     Past Medical History   Diagnosis Date   ??? HTN (hypertension)    ??? Hypercholesterolemia    ??? GERD (gastroesophageal reflux disease)      Past Surgical History   Procedure Date   ??? Hx hysterectomy      No family history on file.  History   Substance Use Topics   ??? Smoking status: Current Everyday Smoker   ??? Smokeless tobacco: Never Used   ??? Alcohol  Use: Not on file          Objective:  Ht 5\' 4"  (1.626 m)   Wt 235 lb (106.595 kg)   BMI 40.34 kg/m2  wdwn 56 yo wf  In NAD. A&O.  HEENT -- Pupils round.  O/P Clear,  .right eyelid- 1 cm seb cyst medial aspect  Neck -- Supple. No JVD.  Heart -- RRR. No R/M/G.    Lungs -- CTA.  Abdomen -- Soft. Non-tender. Non-distended. No masses. Bowel sounds present.  Extremities -- No edema.good pulses      Results for orders placed in visit on 04/05/12   AMB POC URINALYSIS DIP STICK MANUAL W/O MICRO       Component Value Range    Color Yellow  (none)    Clarity Clear  (none)    Glucose 4+  (none)    Bilirubin Negative  (none)    Ketones Trace  (  none)    Spec.Grav. 1.015  1.001 - 1.035    Blood Trace  (none)    pH 6.5  4.6 - 8.0    Protein, Urine Negative  Negative mg/dL    Urobilinogen 0.2 mg/dL  0.2 - 1    Nitrites Negative  (none)    Leukocyte esterase Negative  (none)   AMB POC GLUCOSE BLOOD, BY GLUCOSE MONITORING DEVICE       Component Value Range    Glucose POC 584 (*) 65 - 99 mg/dL       Assessment/Plan:  1. Diabetes mellitus  AMB POC URINALYSIS DIP STICK MANUAL W/O MICRO, MICROALBUMIN:CREATININE RATIO, RANDOM URINE , AMB POC GLUCOSE BLOOD, BY GLUCOSE MONITORING DEVICE, metFORMIN ER (GLUCOPHAGE XR) 500 mg tablet   2. Sebaceous cyst  DRAIN SKIN ABSCESS SIMPLE   start  nph 10 units at night.   Use humalog sliding scale as detailed in instructions.  Start metformin tonight.   Call with blood sugars Friday.        Author:  Lake Bells, MD 04/05/2012 3:54 PM     Subjective:    Dana [name] is a 56 y.o. female who presents for lesion removal. We have discussed this procedure, including option of not performing surgery, technique of surgery and potential for scarring at an earlier visit.    Oubjective:   Patient appears well.   Ht 5\' 4"  (1.626 m)   Wt 235 lb (106.595 kg)   BMI 40.34 kg/m2  Skin: 1 cm  Cyst rt medial lower eyelid    Assessment:   sebaceous cyst    Procedure Note:   After informed consent was obtained, using  nothing for cleansing and nothing for anesthetic, with sterile technique, cyst puncture and removal was performed. Antibiotic dressing is applied, and wound care instructions provided.  Be alert for any signs of cutaneous infection. The procedure was well tolerated without complications.

## 2012-04-05 NOTE — Progress Notes (Signed)
Quick Note:    i called and pt will come in today to start meds  ______

## 2012-04-06 LAB — MICROALBUMIN:CREATININE RATIO, RANDOM URINE
Creatinine, urine random: 25.9 mg/dL (ref 15.0–278.0)
Microalb/Creat ratio (ug/mg creat.): <3.9 mg/g creat (ref 0.0–30.0)
Microalbumin, urine: <1 ug/mL (ref 0.0–17.0)

## 2012-04-06 NOTE — Telephone Encounter (Signed)
Type 2

## 2012-04-06 NOTE — Telephone Encounter (Signed)
Please advise

## 2012-04-06 NOTE — Telephone Encounter (Signed)
Dana [name] is calling to speak to [name]. Wants to know if she's Diabetes Type 1 or 2. Please call. Ph # is [phone].

## 2012-04-06 NOTE — Telephone Encounter (Signed)
pls call- she is type 2

## 2012-04-06 NOTE — Telephone Encounter (Signed)
Called patient and relayed doctor's message

## 2012-04-07 NOTE — Telephone Encounter (Signed)
Dana [name] is calling to speak to [name] re; BS readings. Please call. Ph # is [phone].

## 2012-04-07 NOTE — Telephone Encounter (Signed)
Patient called in BS readings:      7/31  Before dinner 503, before bed 360,   8/1  Before breakfast  390, before lunch 397, before dinner 370,  Before bed 314,    8/2 before breakfast 360, before lunch 437

## 2012-04-07 NOTE — Telephone Encounter (Signed)
i called - lmom for Becky. Hope leg cramps are better.  Increase nph to 20 units at bed, increase metformin to two at dinner

## 2012-04-10 MED ORDER — INSULIN NPH HUMAN RECOMB 100 UNIT/ML (3 ML) SUB-Q PEN
100 unit/mL (3 mL) | PACK | SUBCUTANEOUS | Status: DC
Start: 2012-04-10 — End: 2012-05-16

## 2012-04-10 NOTE — Telephone Encounter (Signed)
Called patient and relayed doctor's message Patient will call in BS reading on Thursday am

## 2012-04-10 NOTE — Telephone Encounter (Signed)
Patient called in current BS reading from over the wkend  8/2 before lunch 437, before dinner 283,  Before bed 312,  8/3 bf breakfast 344, bf lunch 282, bf dinner 347, bf bed 292,  8/4 bf breakfast 268, bf lunch 338, bf dinner 169, (worked in yard all afternoon), bf bed 276,  8/5 bf breakfast 248  Patient states this is the best she has felt in a long time!

## 2012-04-10 NOTE — Telephone Encounter (Signed)
pls make sure she is taking 20 units of nph and 2 metformin at dinner right now.  If that is true, increase to 30 units of nph at bedtime. and continue 2 metformin at dinner. Call with bs 2 days.

## 2012-04-10 NOTE — Telephone Encounter (Signed)
Called patient and left message awaiting response

## 2012-04-10 NOTE — Telephone Encounter (Signed)
Dana [name] is calling to speak to [name] re Glucose readings over the week end. Please call. Ph # is [phone].

## 2012-04-13 NOTE — Telephone Encounter (Signed)
Patient called to give readings since increase, 8/5 bf lunch 341, bf dinner 248, bf bed 303,   8/6 bf breakfast 139,  Bf lunch 118,  Bf  Dinner 268,  Bf bed  312,  8/7  Bf breakfast  231,  bf lunch 259,  Bf dinner 185

## 2012-04-14 NOTE — Telephone Encounter (Signed)
pls call- continue 30 units of lantus at night at try to increase metformin- one in the am and two at dinner

## 2012-04-14 NOTE — Telephone Encounter (Signed)
Called patient and relayed doctor's message

## 2012-04-25 MED ORDER — METFORMIN SR 500 MG 24 HR TABLET
500 mg | ORAL_TABLET | ORAL | Status: DC
Start: 2012-04-25 — End: 2012-05-09

## 2012-05-09 NOTE — Telephone Encounter (Signed)
Ordered med

## 2012-06-27 NOTE — Telephone Encounter (Signed)
Refilled mail order meds

## 2012-11-01 ENCOUNTER — Encounter

## 2013-01-24 LAB — AMB POC URINALYSIS DIP STICK AUTO W/ MICRO
Bilirubin (UA POC): NEGATIVE
Glucose (UA POC): NEGATIVE
Ketones (UA POC): NEGATIVE
Leukocyte esterase (UA POC): NEGATIVE
Nitrites (UA POC): NEGATIVE
Protein (UA POC): NEGATIVE mg/dL
Specific gravity (UA POC): 1.02 (ref 1.001–1.035)
Urobilinogen (UA POC): 0.2 (ref 0.2–1)
pH (UA POC): 6 (ref 4.6–8.0)

## 2013-01-24 NOTE — Progress Notes (Signed)
Dana [name] is a 57 y.o. female and presents with   Chief Complaint   Patient presents with   ??? Follow-up     weight gain and swollen ankles   .  Having trouble sleeping well- wakes up hurting.- mostly in hips.  Has not had a sleep study. Has not taken any cybalta in 3 weeks- has not made a difference in the pain,  But feels more depressed.  In the last 3 weeks has had 15 lb weight gain. - thinks it is mostly fluid retention. Legs swelling. Took an additional 1/2 maxzide-  Blood sugars  Were good at Christmas- when she has checked in the last few weeks under 150. Has been seeing chiropractor . Has not been taking simvastatin- does not know why she stopped- pt concerned that she had chf, ms, etc. Etc.  Answered all questions      Current Outpatient Prescriptions   Medication Sig Dispense Refill   ??? furosemide (LASIX) 40 mg tablet One twice a week prn fluid  20 Tab  1   ??? sertraline (ZOLOFT) 100 mg tablet Take 1 Tab by mouth daily.  30 Tab  3   ??? HUMULIN N PEN 100 unit/mL (3 mL) pen INJECT 30 UNITS SUB-Q AT BED TIME DX 250:00  9 Syringe  0   ??? metFORMIN ER (GLUCOPHAGE XR) 500 mg tablet Take one tablet in the morning and 2 tablets with dinner  270 Tab  3   ??? triamterene-hydrochlorothiazide (MAXZIDE) 37.5-25 mg per tablet TAKE 1 TABLET EVERY DAY  90 Tab  3   ??? losartan (COZAAR) 50 mg tablet TAKE 1 TABLET BY MOUTH EVERY DAY  90 Tab  3   ??? potassium chloride SA (MICRO-K) 10 mEq capsule TAKE 1 CAPSULE EVERY DAY  90 Cap  2   ??? simvastatin (ZOCOR) 10 mg tablet TAKE 1 TABLET EVERY DAY  30 Tab  10   ??? Cholecalciferol, Vitamin D3, (VITAMIN D) 5,000 unit Tab Take  by mouth.         ??? simvastatin (ZOCOR) 20 mg tablet Take  by mouth nightly.       ??? triamterene-hydrochlorothiazide (DYAZIDE) 37.5-25 mg per capsule Take  by mouth daily.         Allergies   Allergen Reactions   ??? Adhesive Itching   ??? Dolobid (Diflunisal) Itching     Itching,swelling     Past Medical History   Diagnosis Date   ??? HTN (hypertension)    ???  Hypercholesterolemia    ??? GERD (gastroesophageal reflux disease)    ??? Diabetes 01/24/2013     Past Surgical History   Procedure Laterality Date   ??? Hx hysterectomy       History reviewed. No pertinent family history.  History   Substance Use Topics   ??? Smoking status: Former Smoker -- 1.00 packs/day     Quit date: 06/27/2011   ??? Smokeless tobacco: Never Used   ??? Alcohol Use: Not on file          Objective:  BP 110/84   Ht 5\' 4"  (1.626 m)   Wt 254 lb (115.214 kg)   BMI 43.58 kg/m2  wdwn 57 yo wf  In NAD. A&O.  HEENT -- Pupils round.  O/P Clear.  Neck -- Supple. No JVD.  Heart -- RRR. No R/M/G.  Lungs -- CTA.  Abdomen -- Soft. Non-tender. Non-distended. No masses. Bowel sounds present.  Extremities -- +2 edema.+1 pulses  Results for orders placed in visit on 01/24/13   AMB POC URINALYSIS DIP STICK AUTO W/ MICRO       Result Value Range    Color (UA POC) Yellow      Clarity (UA POC) Clear      Glucose (UA POC) Negative  Negative    Bilirubin (UA POC) Negative  Negative    Ketones (UA POC) Negative  Negative    Specific gravity (UA POC) 1.020  1.001 - 1.035    Blood (UA POC) 1+  Negative    pH (UA POC) 6.0  4.6 - 8.0    Protein (UA POC) Negative  Negative mg/dL    Urobilinogen (UA POC) 0.2 mg/dL  0.2 - 1    Nitrites (UA POC) Negative  Negative    Leukocyte esterase (UA POC) Negative  Negative         Assessment/Plan:    ICD-9-CM    1. Hypercholesterolemia 272.0 TSH, 3RD GENERATION   2. HTN (hypertension) 401.9 PR COLLECTION VENOUS BLOOD,VENIPUNCTURE     METABOLIC PANEL, COMPREHENSIVE     CANCELED: AMB POC URINALYSIS DIP STICK MANUAL W/O MICRO   3. Diabetes 250.00 HEMOGLOBIN A1C   4. Edema 782.3 furosemide (LASIX) 40 mg tablet     AMB POC URINALYSIS DIP STICK AUTO W/ MICRO   5. Depression 311 sertraline (ZOLOFT) 100 mg tablet     Sleep study, restart zoloft, lasix twice a week prn, decrease salt in diet      Author:  Lake Bells, MD 01/24/2013 10:41 AM

## 2013-01-24 NOTE — Patient Instructions (Signed)
No ham, bacon or sausage, avoid olives, pickles. soups

## 2013-01-25 LAB — METABOLIC PANEL, COMPREHENSIVE
A-G Ratio: 1.7 (ref 1.1–2.5)
ALT (SGPT): 24 IU/L (ref 0–32)
AST (SGOT): 17 IU/L (ref 0–40)
Albumin: 4.1 g/dL (ref 3.5–5.5)
Alk. phosphatase: 63 IU/L (ref 39–117)
BUN/Creatinine ratio: 29 — ABNORMAL HIGH (ref 9–23)
BUN: 18 mg/dL (ref 6–24)
Bilirubin, total: 0.4 mg/dL (ref 0.0–1.2)
CO2: 21 mmol/L (ref 19–28)
Calcium: 9.4 mg/dL (ref 8.7–10.2)
Chloride: 100 mmol/L (ref 97–108)
Creatinine: 0.62 mg/dL (ref 0.57–1.00)
GFR est AA: 117 mL/min/{1.73_m2} (ref >59)
GFR est non-AA: 102 mL/min/{1.73_m2} (ref >59)
GLOBULIN, TOTAL: 2.4 g/dL (ref 1.5–4.5)
Glucose: 117 mg/dL — ABNORMAL HIGH (ref 65–99)
Potassium: 4.4 mmol/L (ref 3.5–5.2)
Protein, total: 6.5 g/dL (ref 6.0–8.5)
Sodium: 139 mmol/L (ref 134–144)

## 2013-01-25 LAB — TSH 3RD GENERATION: TSH: 1.76 u[IU]/mL (ref 0.450–4.500)

## 2013-01-25 LAB — HEMOGLOBIN A1C WITH EAG: Hemoglobin A1c: 6.8 % — ABNORMAL HIGH (ref 4.8–5.6)

## 2013-01-25 NOTE — Progress Notes (Signed)
Quick Note:    pls call- blood sugars much better, thyroid normal and kidney and liver function fine.. Avoid salty foods and increase exercise. Set up sleep test  ______

## 2013-02-01 NOTE — Progress Notes (Signed)
Met with pt to go over the use of the sleep apnea machine and pt to take the machine home

## 2013-02-26 NOTE — Progress Notes (Signed)
Dana [name] is a 57 y.o. female and presents with   Chief Complaint   Patient presents with   ??? Follow-up   .  Hips and back hurting.  Has seen PT- doing exercises at home- seems to help most of the time. Here to discuss sleep apnea tests.        Current Outpatient Prescriptions   Medication Sig Dispense Refill   ??? furosemide (LASIX) 40 mg tablet One twice a week prn fluid  20 Tab  1   ??? sertraline (ZOLOFT) 100 mg tablet Take 1 Tab by mouth daily.  30 Tab  3   ??? HUMULIN N PEN 100 unit/mL (3 mL) pen INJECT 30 UNITS SUB-Q AT BED TIME DX 250:00  9 Syringe  0   ??? metFORMIN ER (GLUCOPHAGE XR) 500 mg tablet Take one tablet in the morning and 2 tablets with dinner  270 Tab  3   ??? triamterene-hydrochlorothiazide (MAXZIDE) 37.5-25 mg per tablet TAKE 1 TABLET EVERY DAY  90 Tab  3   ??? losartan (COZAAR) 50 mg tablet TAKE 1 TABLET BY MOUTH EVERY DAY  90 Tab  3   ??? potassium chloride SA (MICRO-K) 10 mEq capsule TAKE 1 CAPSULE EVERY DAY  90 Cap  2   ??? simvastatin (ZOCOR) 10 mg tablet TAKE 1 TABLET EVERY DAY  30 Tab  10   ??? Cholecalciferol, Vitamin D3, (VITAMIN D) 5,000 unit Tab Take  by mouth.         ??? simvastatin (ZOCOR) 20 mg tablet Take  by mouth nightly.       ??? triamterene-hydrochlorothiazide (DYAZIDE) 37.5-25 mg per capsule Take  by mouth daily.         Allergies   Allergen Reactions   ??? Adhesive Itching   ??? Dolobid (Diflunisal) Itching     Itching,swelling     Past Medical History   Diagnosis Date   ??? HTN (hypertension)    ??? Hypercholesterolemia    ??? GERD (gastroesophageal reflux disease)    ??? Diabetes 01/24/2013     Past Surgical History   Procedure Laterality Date   ??? Hx hysterectomy       History reviewed. No pertinent family history.  History   Substance Use Topics   ??? Smoking status: Former Smoker -- 1.00 packs/day     Quit date: 06/27/2011   ??? Smokeless tobacco: Never Used   ??? Alcohol Use: Not on file          Objective:  BP 140/90   Ht 5\' 4"  (1.626 m)   Wt 250 lb (113.399 kg)   BMI 42.89 kg/m2  wdwn 57 yo wf  In  NAD. A&O.  HEENT -- Pupils round.  O/P Clear.  Neck -- Supple. No JVD.  Heart -- RRR. No R/M/G.  Lungs -- CTA.  Abdomen -- Soft. Non-tender. Non-distended. No masses. Bowel sounds present.  Extremities -- No edema.        Assessment/Plan:    ICD-9-CM   1. Sleep apnea 780.57           Author:  Lake Bells, MD 02/26/2013 9:09 AM

## 2013-02-26 NOTE — Patient Instructions (Signed)
Set up cpap

## 2013-03-02 NOTE — Progress Notes (Signed)
Sleep study interpreted- mild to mod sleep apnea.

## 2013-03-02 NOTE — Progress Notes (Signed)
Will initiate cpap

## 2013-05-02 LAB — AMB POC URINALYSIS DIP STICK MANUAL W/O MICRO
Bilirubin (UA POC): NEGATIVE
Blood (UA POC): NEGATIVE
Glucose (UA POC): NEGATIVE
Ketones (UA POC): NEGATIVE
Leukocyte esterase (UA POC): NEGATIVE
Nitrites (UA POC): NEGATIVE
Protein (UA POC): NEGATIVE mg/dL
Specific gravity (UA POC): 1.015 (ref 1.001–1.035)
Urobilinogen (UA POC): 0.2 (ref 0.2–1)
pH (UA POC): 6 (ref 4.6–8.0)

## 2013-05-02 NOTE — Progress Notes (Signed)
Dana [name] is a 56 y.o. female and presents with   Chief Complaint   Patient presents with   ??? Back Pain   .  Started having right sided back pain  Last Friday or Saturday.  Usually has back pain on left.  Went to chiropractor Monday and he did some PT/adjustments/ laser, and again yesterday.  Took ibuprofen and robaxin and laid on inversion bed and felt fine.  When standing up had increasing pain.  If sitting - gets stiffer      Current Outpatient Prescriptions   Medication Sig Dispense Refill   ??? furosemide (LASIX) 40 mg tablet One twice a week prn fluid  20 Tab  1   ??? sertraline (ZOLOFT) 100 mg tablet Take 1 Tab by mouth daily.  30 Tab  3   ??? HUMULIN N PEN 100 unit/mL (3 mL) pen INJECT 30 UNITS SUB-Q AT BED TIME DX 250:00  9 Syringe  0   ??? metFORMIN ER (GLUCOPHAGE XR) 500 mg tablet Take one tablet in the morning and 2 tablets with dinner  270 Tab  3   ??? triamterene-hydrochlorothiazide (MAXZIDE) 37.5-25 mg per tablet TAKE 1 TABLET EVERY DAY  90 Tab  3   ??? losartan (COZAAR) 50 mg tablet TAKE 1 TABLET BY MOUTH EVERY DAY  90 Tab  3   ??? potassium chloride SA (MICRO-K) 10 mEq capsule TAKE 1 CAPSULE EVERY DAY  90 Cap  2   ??? simvastatin (ZOCOR) 10 mg tablet TAKE 1 TABLET EVERY DAY  30 Tab  10   ??? Cholecalciferol, Vitamin D3, (VITAMIN D) 5,000 unit Tab Take  by mouth.         ??? simvastatin (ZOCOR) 20 mg tablet Take  by mouth nightly.       ??? triamterene-hydrochlorothiazide (DYAZIDE) 37.5-25 mg per capsule Take  by mouth daily.         Allergies   Allergen Reactions   ??? Adhesive Itching   ??? Dolobid [Diflunisal] Itching     Itching,swelling     Past Medical History   Diagnosis Date   ??? HTN (hypertension)    ??? Hypercholesterolemia    ??? GERD (gastroesophageal reflux disease)    ??? Diabetes 01/24/2013     Past Surgical History   Procedure Laterality Date   ??? Hx hysterectomy       History reviewed. No pertinent family history.  History   Substance Use Topics   ??? Smoking status: Former Smoker -- 1.00 packs/day     Quit date:  06/27/2011   ??? Smokeless tobacco: Never Used   ??? Alcohol Use: Not on file          Objective:  BP 140/80   Ht 5\' 4"  (1.626 m)   Wt 254 lb (115.214 kg)   BMI 43.58 kg/m2  wdwn 89 y o wf  In NAD. A&O.  HEENT -- Pupils round.  O/P Clear.  Neck -- Supple. No JVD.  Heart -- RRR. No R/M/G.  Lungs -- CTA.  Abdomen -- Soft. Non-tender. Non-distended. No masses. Bowel sounds present.  lspine - tender over l4/l5 - tender over SI , and right hip just below  Asis.    Extremties -- No edema. Good pulses    slrt- neg.,     Results for orders placed in visit on 05/02/13   AMB POC URINALYSIS DIP STICK MANUAL W/O MICRO       Result Value Range    Color (UA POC) Yellow  Clarity (UA POC) Clear      Glucose (UA POC) Negative  Negative    Bilirubin (UA POC) Negative  Negative    Ketones (UA POC) Negative  Negative    Specific gravity (UA POC) 1.015  1.001 - 1.035    Blood (UA POC) Negative  Negative    pH (UA POC) 6.0  4.6 - 8.0    Protein (UA POC) Negative  Negative mg/dL    Urobilinogen (UA POC) 0.2 mg/dL  0.2 - 1    Nitrites (UA POC) Negative  Negative    Leukocyte esterase (UA POC) Negative  Negative       Assessment/Plan:    ICD-9-CM    1. Back pain 724.5 AMB POC URINALYSIS DIP STICK MANUAL W/O MICRO           Author:  Lake Bells, MD 05/02/2013 11:49 AM    Indications:   Symptom relief from osteoarthritis    Procedure:  After consent was obtained, using sterile technique the right SI  joint was prepped using Betadine. Local anesthetic used: 1% lidocaine.. The joint was entered      Kenalog 40 mg was mixed with 1% lidocaine 1 ml  and injected into the joint and the needle withdrawn.  The procedure was well tolerated.  The patient is asked to continue to rest the joint for a few more days before resuming regular activities.  It may be more painful for the first 1-2 days.  Watch for fever, or increased swelling or persistent pain in the joint. Call or return to clinic prn if such symptoms occur or there is failure to improve as  anticipated.

## 2013-05-02 NOTE — Patient Instructions (Signed)
Ice back, PT if not better

## 2013-06-22 LAB — AMB POC COMPLETE CBC,AUTOMATED ENTER
ABS. GRANS (POC): 6.3 10*3/uL (ref 1.4–6.5)
ABS. LYMPHS (POC): 3.2 10*3/uL (ref 1.2–3.4)
ABS. MONOS (POC): 0.4 10*3/uL (ref 0.1–0.6)
GRANULOCYTES (POC): 63.2 % (ref 42.2–75.2)
HCT (POC): 42.8 % (ref 35.0–60.0)
HGB (POC): 13.8 g/dL (ref 11–18)
LYMPHOCYTES (POC): 32.8 % (ref 20.5–51.1)
MCH (POC): 29.6 pg (ref 27.0–31.0)
MCHC (POC): 32.2 g/dL — AB (ref 33.0–37.0)
MCV (POC): 92 fL (ref 80.0–99.9)
MONOCYTES (POC): 4 % (ref 1.7–9.3)
MPV (POC): 7.7 fL — AB (ref 7.8–11)
PLATELET (POC): 248 10*3/uL (ref 150–450)
RBC (POC): 4.65 M/uL (ref 4.00–6.00)
RDW (POC): 15.7 % — AB (ref 11.6–13.7)
WBC (POC): 9.9 10*3/uL (ref 4.5–10.5)

## 2013-06-22 LAB — AMB POC URINALYSIS DIP STICK MANUAL W/O MICRO
Bilirubin (UA POC): NEGATIVE
Blood (UA POC): NEGATIVE
Glucose (UA POC): NEGATIVE
Ketones (UA POC): NEGATIVE
Leukocyte esterase (UA POC): NEGATIVE
Nitrites (UA POC): NEGATIVE
Protein (UA POC): NEGATIVE mg/dL
Specific gravity (UA POC): 1.025 (ref 1.001–1.035)
Urobilinogen (UA POC): 0.2 (ref 0.2–1)
pH (UA POC): 6 (ref 4.6–8.0)

## 2013-06-22 LAB — AMB POC FECAL BLOOD, OCCULT, QL 3 CARDS: Hemoccult (POC): NEGATIVE

## 2013-06-22 NOTE — Progress Notes (Signed)
History and Physical    Dana [name] is a 57 y.o. female presents for physical exam.  Not taking potassium or zocor- did not remember being on them.    Past Medical History   Diagnosis Date   ??? HTN (hypertension)    ??? Hypercholesterolemia    ??? GERD (gastroesophageal reflux disease)    ??? Diabetes 01/24/2013     Past Surgical History   Procedure Laterality Date   ??? Hx hysterectomy       Current Outpatient Prescriptions   Medication Sig Dispense Refill   ??? triamterene-hydrochlorothiazide (MAXZIDE) 37.5-25 mg per tablet TAKE 1 TABLET DAILY  90 tablet  2   ??? metFORMIN ER (GLUCOPHAGE XR) 500 mg tablet TAKE 1 TABLET IN THE MORNING AND 2 TABLETS WITH DINNER  270 tablet  2   ??? Insulin Needles, Disposable, 31 X 5/16 " ndle by IntraMUSCular route daily. 1-2 times daily for insulin  DX 250.00  90 day supply  3 Package  11   ??? HUMULIN N KWIKPEN 100 unit/mL (3 mL) pen INJECT 30 UNITS UNDER THE SKIN AT BEDTIME  1 Package  1   ??? ONE TOUCH ULTRA TEST strip USE 1 TO 2 TIMES DAILY  2 Package  3   ??? sertraline (ZOLOFT) 100 mg tablet TAKE 1 TAB BY MOUTH DAILY.  30 tablet  3   ??? furosemide (LASIX) 40 mg tablet ONE TWICE A WEEK AS NEEDED FOR FLUID  20 tablet  1   ??? losartan (COZAAR) 50 mg tablet TAKE 1 TABLET BY MOUTH EVERY DAY  90 Tab  3   ??? Cholecalciferol, Vitamin D3, (VITAMIN D) 5,000 unit Tab Take  by mouth.         ??? triamterene-hydrochlorothiazide (DYAZIDE) 37.5-25 mg per capsule Take  by mouth daily.         Allergies   Allergen Reactions   ??? Adhesive Itching   ??? Dolobid [Diflunisal] Itching     Itching,swelling     History     Social History   ??? Marital Status: SINGLE     Spouse Name: N/A     Number of Children: N/A   ??? Years of Education: N/A     Social History Main Topics   ??? Smoking status: Former Smoker -- 1.00 packs/day     Quit date: 06/27/2011   ??? Smokeless tobacco: Never Used   ??? Alcohol Use: Not on file   ??? Drug Use: Not on file   ??? Sexually Active: Not on file     Other Topics Concern   ??? Not on file     Social History  Narrative   ??? No narrative on file     History reviewed. No pertinent family history.    Review of Systems:  Denies dysphagia, chest pain, or SOB.  No nausea or vomiting, or diarrhea or constipation.  No fever, chills, night sweats, or cough.  No dsyuria, frequency or abdominal pain.  No headaches. Mamm-?  bmd - does not remember.  Colon- had after hyst.     Yearly eye exam:   yes  Yearly dental exam: no    Objective  BP 134/90   Ht 5' 3.5" (1.613 m)   Wt 258 lb (117.028 kg)   BMI 44.98 kg/m2  General appearance - well developed, well nourished 57 y.o.wf- nad  Eyes - PERRL tm- occluded with cerumen.  Neck --Supple, no anterior cervical nodes, no thyromegaly.?bruits  Lungs --CTA  Heart - Regular  rate and rhythm  Abdomen - soft, no tenderness or distention  Breasts - fibrous areas bilat  Pelvic- normal vaginal cuff, no masses,   Rectal -- hem. negative, brown stool  Extremities - no clubbing, cyanosis or edema, good pulses    Results for orders placed in visit on 06/22/13   AMB POC URINALYSIS DIP STICK MANUAL W/O MICRO       Result Value Range    Color (UA POC) Yellow      Clarity (UA POC) Clear      Glucose (UA POC) Negative  Negative    Bilirubin (UA POC) Negative  Negative    Ketones (UA POC) Negative  Negative    Specific gravity (UA POC) 1.025  1.001 - 1.035    Blood (UA POC) Negative  Negative    pH (UA POC) 6.0  4.6 - 8.0    Protein (UA POC) Negative  Negative mg/dL    Urobilinogen (UA POC) 0.2 mg/dL  0.2 - 1    Nitrites (UA POC) Negative  Negative    Leukocyte esterase (UA POC) Negative  Negative   AMB POC COMPLETE CBC,AUTOMATED ENTER       Result Value Range    WBC (POC) 9.9  4.5 - 10.5 K/uL    LYMPHOCYTES (POC) 32.8  20.5 - 51.1 %    MONOCYTES (POC) 4.0  1.7 - 9.3 %    GRANULOCYTES (POC) 63.2  42.2 - 75.2 %    ABS. LYMPHS (POC) 3.2  1.2 - 3.4 K/uL    ABS. MONOS (POC) 0.4  0.1 - 0.6 10^3/ul    ABS. GRANS (POC) 6.3  1.4 - 6.5 10^3/ul    RBC (POC) 4.65  4.00 - 6.00 M/uL    HGB (POC) 13.8  11 - 18 g/dL    HCT  (POC) 13.0  86.5 - 60.0 %    MCV (POC) 92.0  80.0 - 99.9 fL    MCH (POC) 29.6  27.0 - 31.0 pg    MCHC (POC) 32.2 (*) 33.0 - 37.0 g/dL    RDW (POC) 78.4 (*) 69.6 - 13.7 %    PLATELET (POC) 248  150 - 450 K/uL    MPV (POC) 7.7 (*) 7.8 - 11 fL         Assessment/Plan      ICD-9-CM    1. Routine gynecological examination V72.31 PAP IG, CT-NG, RFX HPV EXBM(841324, [id number])     AMB POC URINALYSIS DIP STICK MANUAL W/O MICRO     LIPID PANEL     PR COLLECTION VENOUS BLOOD,VENIPUNCTURE     METABOLIC PANEL, COMPREHENSIVE     AMB POC COMPLETE CBC,AUTOMATED ENTER     TSH, 3RD GENERATION     HEMOGLOBIN A1C     MICROALBUMIN:CREATININE RATIO, RANDOM URINE     AMB POC FECAL BLOOD, OCCULT, QL 3 CARDS   2. Diabetes 250.00    3. Hypercholesterolemia 272.0    4. HTN (hypertension) 401.9    5. Bruit 785.9 DUPLEX CAROTID BILATERAL   6. Colon cancer screening V76.51 REFERRAL TO GASTROENTEROLOGY     Mammogram, bone density, colon, dopplers    AUTHOR:  Lake Bells, MD 06/22/2013 1:40 PM

## 2013-06-22 NOTE — Patient Instructions (Addendum)
Results for orders placed in visit on 06/22/13   AMB POC URINALYSIS DIP STICK MANUAL W/O MICRO       Result Value Range    Color (UA POC) Yellow      Clarity (UA POC) Clear      Glucose (UA POC) Negative  Negative    Bilirubin (UA POC) Negative  Negative    Ketones (UA POC) Negative  Negative    Specific gravity (UA POC) 1.025  1.001 - 1.035    Blood (UA POC) Negative  Negative    pH (UA POC) 6.0  4.6 - 8.0    Protein (UA POC) Negative  Negative mg/dL    Urobilinogen (UA POC) 0.2 mg/dL  0.2 - 1    Nitrites (UA POC) Negative  Negative    Leukocyte esterase (UA POC) Negative  Negative   AMB POC COMPLETE CBC,AUTOMATED ENTER       Result Value Range    WBC (POC) 9.9  4.5 - 10.5 K/uL    LYMPHOCYTES (POC) 32.8  20.5 - 51.1 %    MONOCYTES (POC) 4.0  1.7 - 9.3 %    GRANULOCYTES (POC) 63.2  42.2 - 75.2 %    ABS. LYMPHS (POC) 3.2  1.2 - 3.4 K/uL    ABS. MONOS (POC) 0.4  0.1 - 0.6 10^3/ul    ABS. GRANS (POC) 6.3  1.4 - 6.5 10^3/ul    RBC (POC) 4.65  4.00 - 6.00 M/uL    HGB (POC) 13.8  11 - 18 g/dL    HCT (POC) 13.0  86.5 - 60.0 %    MCV (POC) 92.0  80.0 - 99.9 fL    MCH (POC) 29.6  27.0 - 31.0 pg    MCHC (POC) 32.2 (*) 33.0 - 37.0 g/dL    RDW (POC) 78.4 (*) 69.6 - 13.7 %    PLATELET (POC) 248  150 - 450 K/uL    MPV (POC) 7.7 (*) 7.8 - 11 fL     Mammogram, bone density, dopplers, colon

## 2013-06-23 LAB — MICROALBUMIN:CREATININE RATIO, RANDOM URINE
Creatinine, urine random: 70.8 mg/dL (ref 15.0–278.0)
Microalb/Creat ratio (ug/mg creat.): <4.2 mg/g creat (ref 0.0–30.0)
Microalbumin, urine: <3 ug/mL (ref 0.0–17.0)

## 2013-06-23 LAB — METABOLIC PANEL, COMPREHENSIVE
A-G Ratio: 1.6 (ref 1.1–2.5)
ALT (SGPT): 17 IU/L (ref 0–32)
AST (SGOT): 11 IU/L (ref 0–40)
Albumin: 4 g/dL (ref 3.5–5.5)
Alk. phosphatase: 65 IU/L (ref 39–117)
BUN/Creatinine ratio: 26 — ABNORMAL HIGH (ref 9–23)
BUN: 16 mg/dL (ref 6–24)
Bilirubin, total: <0.2 mg/dL (ref 0.0–1.2)
CO2: 22 mmol/L (ref 18–29)
Calcium: 8.8 mg/dL (ref 8.7–10.2)
Chloride: 103 mmol/L (ref 97–108)
Creatinine: 0.62 mg/dL (ref 0.57–1.00)
GFR est AA: 117 mL/min/{1.73_m2} (ref >59)
GFR est non-AA: 102 mL/min/{1.73_m2} (ref >59)
GLOBULIN, TOTAL: 2.5 g/dL (ref 1.5–4.5)
Glucose: 81 mg/dL (ref 65–99)
Potassium: 4.4 mmol/L (ref 3.5–5.2)
Protein, total: 6.5 g/dL (ref 6.0–8.5)
Sodium: 142 mmol/L (ref 134–144)

## 2013-06-23 LAB — TSH 3RD GENERATION: TSH: 1.52 u[IU]/mL (ref 0.450–4.500)

## 2013-06-23 LAB — LIPID PANEL
Cholesterol, total: 205 mg/dL — ABNORMAL HIGH (ref 100–199)
HDL Cholesterol: 47 mg/dL (ref >39)
LDL, calculated: 136 mg/dL — ABNORMAL HIGH (ref 0–99)
Triglyceride: 108 mg/dL (ref 0–149)
VLDL, calculated: 22 mg/dL (ref 5–40)

## 2013-06-23 LAB — HEMOGLOBIN A1C WITH EAG: Hemoglobin A1c: 6.5 % — ABNORMAL HIGH (ref 4.8–5.6)

## 2013-06-25 NOTE — Addendum Note (Signed)
Addended by: Marcie Bal C on: 06/25/2013 10:11 AM     Modules accepted: Level of Service

## 2013-06-25 NOTE — Telephone Encounter (Signed)
Called in patches as requested

## 2013-06-28 LAB — PAP IG, CT-NG, RFX HPV ASCU(183194, 507301)
.: 0
CHLAMYDIA, NUC. ACID AMP, 186114: NEGATIVE
Chlamydia, Nuc. Acid Amp.: NEGATIVE
GONOCOCCUS, NUC. ACID AMP, 186122: NEGATIVE
Gonococcus, Nuc. Acid Amp.: NEGATIVE
LABCORP 019018: 0

## 2013-07-04 ENCOUNTER — Encounter

## 2013-09-21 LAB — AMB POC URINALYSIS DIP STICK MANUAL W/O MICRO
Bilirubin (UA POC): NEGATIVE
Blood (UA POC): NEGATIVE
Glucose (UA POC): NEGATIVE
Ketones (UA POC): NEGATIVE
Leukocyte esterase (UA POC): NEGATIVE
Nitrites (UA POC): NEGATIVE
Protein (UA POC): NEGATIVE mg/dL
Specific gravity (UA POC): 1.02 (ref 1.001–1.035)
Urobilinogen (UA POC): 0.2 (ref 0.2–1)
pH (UA POC): 6.5 (ref 4.6–8.0)

## 2013-09-21 NOTE — Progress Notes (Signed)
Dana [name] is a 58 y.o. female and presents with   Chief Complaint   Patient presents with   ??? Follow-up     3 mos   remove skin tags   .  Pt has been taking simvastatin, would like skin tags removed.  Also having pain in left thumb and lesion rt index finger.  No chest pain or sob      Current Outpatient Prescriptions   Medication Sig Dispense Refill   ??? simvastatin (ZOCOR) 40 mg tablet Take 1 tablet by mouth nightly.  90 tablet  3   ??? lidocaine (LIDODERM) 5 %(700 mg/patch) 1 patch by TransDERmal route every twelve (12) hours. Apply patch to the affected area for 12 hours a day and remove for 12 hours a day.  30 Package  2   ??? triamterene-hydrochlorothiazide (MAXZIDE) 37.5-25 mg per tablet TAKE 1 TABLET DAILY  90 tablet  2   ??? metFORMIN ER (GLUCOPHAGE XR) 500 mg tablet TAKE 1 TABLET IN THE MORNING AND 2 TABLETS WITH DINNER  270 tablet  2   ??? Insulin Needles, Disposable, 31 X 5/16 " ndle by IntraMUSCular route daily. 1-2 times daily for insulin  DX 250.00  90 day supply  3 Package  11   ??? HUMULIN N KWIKPEN 100 unit/mL (3 mL) pen INJECT 30 UNITS UNDER THE SKIN AT BEDTIME  1 Package  1   ??? ONE TOUCH ULTRA TEST strip USE 1 TO 2 TIMES DAILY  2 Package  3   ??? sertraline (ZOLOFT) 100 mg tablet TAKE 1 TAB BY MOUTH DAILY.  30 tablet  3   ??? furosemide (LASIX) 40 mg tablet ONE TWICE A WEEK AS NEEDED FOR FLUID  20 tablet  1   ??? losartan (COZAAR) 50 mg tablet TAKE 1 TABLET BY MOUTH EVERY DAY  90 Tab  3   ??? Cholecalciferol, Vitamin D3, (VITAMIN D) 5,000 unit Tab Take  by mouth.         ??? triamterene-hydrochlorothiazide (DYAZIDE) 37.5-25 mg per capsule Take  by mouth daily.         Allergies   Allergen Reactions   ??? Adhesive Itching   ??? Dolobid [Diflunisal] Itching     Itching,swelling     Past Medical History   Diagnosis Date   ??? HTN (hypertension)    ??? Hypercholesterolemia    ??? GERD (gastroesophageal reflux disease)    ??? Diabetes 01/24/2013     Past Surgical History   Procedure Laterality Date   ??? Hx hysterectomy        History reviewed. No pertinent family history.  History   Substance Use Topics   ??? Smoking status: Former Smoker -- 1.00 packs/day     Quit date: 06/27/2011   ??? Smokeless tobacco: Never Used   ??? Alcohol Use: Not on file          Objective:  BP 120/80   Ht 5' 3.5" (1.613 m)   Wt 259 lb (117.482 kg)   BMI 45.15 kg/m2  wdwn 58 yo wf  In NAD. A&O.  HEENT -- Pupils round.  O/P Clear.  Neck -- Supple. No JVD.  Heart -- RRR. No R/M/G.  Lungs -- CTA.  Abdomen -- Soft. Non-tender. Non-distended. No masses. Bowel sounds present.  Extremeties -- No edema.   rt index finger- distal phalanx verrucous lesion, left 1st mcp- tender on palpation , not hot or red  Axillae bilat- multiple skin tags    Results for orders  placed in visit on 09/21/13   AMB POC URINALYSIS DIP STICK MANUAL W/O MICRO       Result Value Range    Color (UA POC) Yellow      Clarity (UA POC) Clear      Glucose (UA POC) Negative  Negative    Bilirubin (UA POC) Negative  Negative    Ketones (UA POC) Negative  Negative    Specific gravity (UA POC) 1.020  1.001 - 1.035    Blood (UA POC) Negative  Negative    pH (UA POC) 6.5  4.6 - 8.0    Protein (UA POC) Negative  Negative mg/dL    Urobilinogen (UA POC) 0.2 mg/dL  0.2 - 1    Nitrites (UA POC) Negative  Negative    Leukocyte esterase (UA POC) Negative  Negative         Assessment/Plan:    ICD-9-CM    1. Hypercholesterolemia 272.0 LIPID PANEL     DUPLEX CAROTID BILATERAL   2. Diabetes 250.00 PR COLLECTION VENOUS BLOOD,VENIPUNCTURE     METABOLIC PANEL, COMPREHENSIVE     HEMOGLOBIN A1C     AMB POC URINALYSIS DIP STICK MANUAL W/O MICRO           Author:  Lake BellsNancy J Memori Sammon, MD 09/21/2013 2:45 PM    Subjective:    Dana [name] is a 58 y.o. female who presents for lesion removal. We have discussed this procedure, including option of not performing surgery, technique of surgery and potential for scarring at an earlier visit.    Oubjective:   Patient appears well.   BP 120/80   Ht 5' 3.5" (1.613 m)   Wt 259 lb (117.482 kg)    BMI 45.15 kg/m2  Skin: skin tags    Assessment:   skin tags    Procedure Note:   After informed consent was obtained, using betadyne for cleansing and nothing for anesthetic, with sterile technique, shave excision was performed. Antibiotic dressing is applied, and wound care instructions provided.  Be alert for any signs of cutaneous infection. The procedure was well tolerated without complications. Follow up: the patient may return prn.

## 2013-09-21 NOTE — Patient Instructions (Addendum)
F/u one month

## 2013-09-22 LAB — METABOLIC PANEL, COMPREHENSIVE
A-G Ratio: 1.5 (ref 1.1–2.5)
ALT (SGPT): 18 IU/L (ref 0–32)
AST (SGOT): 10 IU/L (ref 0–40)
Albumin: 4.1 g/dL (ref 3.5–5.5)
Alk. phosphatase: 72 IU/L (ref 39–117)
BUN/Creatinine ratio: 26 — ABNORMAL HIGH (ref 9–23)
BUN: 19 mg/dL (ref 6–24)
Bilirubin, total: 0.3 mg/dL (ref 0.0–1.2)
CO2: 19 mmol/L (ref 18–29)
Calcium: 9.4 mg/dL (ref 8.7–10.2)
Chloride: 100 mmol/L (ref 97–108)
Creatinine: 0.73 mg/dL (ref 0.57–1.00)
GFR est AA: 106 mL/min/{1.73_m2} (ref >59)
GFR est non-AA: 92 mL/min/{1.73_m2} (ref >59)
GLOBULIN, TOTAL: 2.7 g/dL (ref 1.5–4.5)
Glucose: 155 mg/dL — ABNORMAL HIGH (ref 65–99)
Potassium: 4.5 mmol/L (ref 3.5–5.2)
Protein, total: 6.8 g/dL (ref 6.0–8.5)
Sodium: 142 mmol/L (ref 134–144)

## 2013-09-22 LAB — LIPID PANEL
Cholesterol, total: 174 mg/dL (ref 100–199)
HDL Cholesterol: 44 mg/dL (ref >39)
LDL, calculated: 92 mg/dL (ref 0–99)
Triglyceride: 191 mg/dL — ABNORMAL HIGH (ref 0–149)
VLDL, calculated: 38 mg/dL (ref 5–40)

## 2013-09-22 LAB — HEMOGLOBIN A1C WITH EAG: Hemoglobin A1c: 7 % — ABNORMAL HIGH (ref 4.8–5.6)

## 2013-09-23 NOTE — Progress Notes (Signed)
Quick Note:    pls call- cholesterol better, but blood sugars worse, Increase metformin to 4 daily, one at lunch, 3 at dinner, or 2 at lunch and 2 at dinner.  ______

## 2013-09-24 NOTE — Progress Notes (Signed)
Quick Note:    Called patient and relayed doctor's message  ______

## 2013-10-01 NOTE — Procedures (Signed)
[facility]  *** FINAL REPORT ***    Name: Mall, Zada  MRN: [id number]    Outpatient  DOB: 19 Sep 1955  HIS Order #: [id number]  TRAKnet Visit #: [id number]  Date: 28 Sep 2013    TYPE OF TEST: Cerebrovascular Duplex    REASON FOR TEST  Carotid bruit    Right Carotid:-             Proximal               Mid                 Distal  cm/s  Systolic  Diastolic  Systolic  Diastolic  Systolic  Diastolic  CCA:     68.0      16.0                            74.0      22.0  Bulb:  ECA:     55.0  ICA:     68.0      25.0                            57.0      22.0  ICA/CCA:  0.9       1.1    ICA Stenosis:    Right Vertebral:-  Finding: Antegrade  Sys:       50.0  Dia:    Right Subclavian:    Left Carotid:-            Proximal                Mid                 Distal  cm/s  Systolic  Diastolic  Systolic  Diastolic  Systolic  Diastolic  CCA:     80.0      20.0                            74.0      21.0  Bulb:  ECA:     54.0  ICA:     60.0      24.0                            60.0      25.0  ICA/CCA:  0.8       1.1    ICA Stenosis:    Left Vertebral:-  Finding: Antegrade  Sys:       51.0  Dia:    Left Subclavian:    INTERPRETATION/FINDINGS  PROCEDURE:  Color duplex ultrasound imaging of extracranial  cerebrovascular arteries.    FINDINGS:       Right:  Internal carotid velocity is within normal limits, there  is no observed narrowing of the flow channel on color Doppler imaging,   and no plaque is visualized on B-mode imaging.  The common and  external carotid arteries are patent and without evidence of  hemodynamically significant stenosis.       Left:  Internal carotid velocity is within normal limits, there  is no observed narrowing of the flow channel on color Doppler imaging,   and no plaque is visualized on B-mode imaging.  The common and  external carotid arteries are patent and without   evidence of  hemodynamically significant stenosis.    IMPRESSION:  Consistent with no stenosis of the right internal carotid   and no  stenosis of the left internal carotid.  Vertebrals are patent  with antegrade flow.    ADDITIONAL COMMENTS    I have personally reviewed the data relevant to the interpretation of  this  study.    TECHNOLOGIST: James Kaczor, RVT  Signed: 09/28/2013 02:15 PM    PHYSICIAN: Bionca Mckey, MD  Signed: 10/01/2013 03:38 PM

## 2013-10-01 NOTE — Procedures (Signed)
[facility]  *** FINAL REPORT ***    Name: Dana [name], Dana [name]  MRN: [id number]    Outpatient  DOB: 23-Mar-1956  HIS Order #: [id number]  TRAKnet Visit #: [id number]  Date: 28 Sep 2013    TYPE OF TEST: Cerebrovascular Duplex    REASON FOR TEST  Carotid bruit    Right Carotid:-             Proximal               Mid                 Distal  cm/s  Systolic  Diastolic  Systolic  Diastolic  Systolic  Diastolic  CCA:     46.9      16.0                            74.0      22.0  Bulb:  ECA:     55.0  ICA:     68.0      25.0                            57.0      22.0  ICA/CCA:  0.9       1.1    ICA Stenosis:    Right Vertebral:-  Finding: Antegrade  Sys:       50.0  Dia:    Right Subclavian:    Left Carotid:-            Proximal                Mid                 Distal  cm/s  Systolic  Diastolic  Systolic  Diastolic  Systolic  Diastolic  CCA:     62.9      20.0                            74.0      21.0  Bulb:  ECA:     54.0  ICA:     60.0      24.0                            60.0      25.0  ICA/CCA:  0.8       1.1    ICA Stenosis:    Left Vertebral:-  Finding: Antegrade  Sys:       51.0  Dia:    Left Subclavian:    INTERPRETATION/FINDINGS  PROCEDURE:  Color duplex ultrasound imaging of extracranial  cerebrovascular arteries.    FINDINGS:       Right:  Internal carotid velocity is within normal limits, there  is no observed narrowing of the flow channel on color Doppler imaging,   and no plaque is visualized on B-mode imaging.  The common and  external carotid arteries are patent and without evidence of  hemodynamically significant stenosis.       Left:  Internal carotid velocity is within normal limits, there  is no observed narrowing of the flow channel on color Doppler imaging,   and no plaque is visualized on B-mode imaging.  The common and  external carotid arteries are patent and without  evidence of  hemodynamically significant stenosis.    IMPRESSION:  Consistent with no stenosis of the right internal carotid   and no  stenosis of the left internal carotid.  Vertebrals are patent  with antegrade flow.    ADDITIONAL COMMENTS    I have personally reviewed the data relevant to the interpretation of  this  study.    TECHNOLOGIST: Bonnye FavaJames Kaczor, RVT  Signed: 09/28/2013 02:15 PM    PHYSICIAN: Gaetana MichaelisGregg Kearstyn Avitia, MD  Signed: 10/01/2013 03:38 PM

## 2013-10-04 NOTE — Progress Notes (Signed)
Quick Note:    pls call- congrats on good dopplers  ______

## 2013-10-04 NOTE — Progress Notes (Signed)
Quick Note:    Called patient and relayed doctor's message  ______

## 2013-10-14 MED ORDER — LOSARTAN 50 MG TAB
50 mg | ORAL_TABLET | ORAL | Status: DC
Start: 2013-10-14 — End: 2014-09-19

## 2013-11-16 LAB — AMB POC COMPLETE CBC,AUTOMATED ENTER
ABS. GRANS (POC): 4.8 10*3/uL (ref 1.4–6.5)
ABS. LYMPHS (POC): 2.5 10*3/uL (ref 1.2–3.4)
ABS. MONOS (POC): 0.6 10*3/uL (ref 0.1–0.6)
GRANULOCYTES (POC): 61.2 % (ref 42.2–75.2)
HCT (POC): 38.6 % (ref 35.0–60.0)
HGB (POC): 12.7 g/dL (ref 11–18)
LYMPHOCYTES (POC): 31.7 % (ref 20.5–51.1)
MCH (POC): 29.6 pg (ref 27.0–31.0)
MCHC (POC): 32.9 g/dL — AB (ref 33.0–37.0)
MCV (POC): 90 fL (ref 80.0–99.9)
MONOCYTES (POC): 7.1 % (ref 1.7–9.3)
MPV (POC): 8 fL (ref 7.8–11)
PLATELET (POC): 213 10*3/uL (ref 150–450)
RBC (POC): 4.29 M/uL (ref 4.00–6.00)
RDW (POC): 14 % — AB (ref 11.6–13.7)
WBC (POC): 7.8 10*3/uL (ref 4.5–10.5)

## 2013-11-16 MED ORDER — LIDOCAINE (PF) 10 MG/ML (1 %) IJ SOLN
10 mg/mL (1 %) | Freq: Once | INTRAMUSCULAR | Status: AC
Start: 2013-11-16 — End: 2013-11-16

## 2013-11-16 NOTE — Progress Notes (Signed)
Dana [name] is a 58 y.o. female and presents with   Chief Complaint   Patient presents with   ??? Joint Pain   .  Has been having hand and foot pain for the last few months.  Has 10 minutes of morning stiffness.  Has a hard time getting out of a chair because she has difficulty pushing out of a chair.  Rt hand burning.   Went to chiropractor and he adjusted her neck and shoulder and is hoping that will help.  Work has been very stressful.      Current Outpatient Prescriptions   Medication Sig Dispense Refill   ??? losartan (COZAAR) 50 mg tablet TAKE 1 TABLET DAILY  90 Tab  3   ??? sertraline (ZOLOFT) 100 mg tablet TAKE 1 TABLET BY MOUTH DAILY  30 tablet  3   ??? simvastatin (ZOCOR) 40 mg tablet Take 1 tablet by mouth nightly.  90 tablet  3   ??? lidocaine (LIDODERM) 5 %(700 mg/patch) 1 patch by TransDERmal route every twelve (12) hours. Apply patch to the affected area for 12 hours a day and remove for 12 hours a day.  30 Package  2   ??? triamterene-hydrochlorothiazide (MAXZIDE) 37.5-25 mg per tablet TAKE 1 TABLET DAILY  90 tablet  2   ??? metFORMIN ER (GLUCOPHAGE XR) 500 mg tablet TAKE 1 TABLET IN THE MORNING AND 2 TABLETS WITH DINNER  270 tablet  2   ??? Insulin Needles, Disposable, 31 X 5/16 " ndle by IntraMUSCular route daily. 1-2 times daily for insulin  DX 250.00  90 day supply  3 Package  11   ??? HUMULIN N KWIKPEN 100 unit/mL (3 mL) pen INJECT 30 UNITS UNDER THE SKIN AT BEDTIME  1 Package  1   ??? ONE TOUCH ULTRA TEST strip USE 1 TO 2 TIMES DAILY  2 Package  3   ??? furosemide (LASIX) 40 mg tablet ONE TWICE A WEEK AS NEEDED FOR FLUID  20 tablet  1   ??? Cholecalciferol, Vitamin D3, (VITAMIN D) 5,000 unit Tab Take  by mouth.         ??? triamterene-hydrochlorothiazide (DYAZIDE) 37.5-25 mg per capsule Take  by mouth daily.         Allergies   Allergen Reactions   ??? Adhesive Itching   ??? Dolobid [Diflunisal] Itching     Itching,swelling     Past Medical History   Diagnosis Date   ??? HTN (hypertension)    ??? Hypercholesterolemia    ???  GERD (gastroesophageal reflux disease)    ??? Diabetes 01/24/2013     Past Surgical History   Procedure Laterality Date   ??? Hx hysterectomy       History reviewed. No pertinent family history.  History   Substance Use Topics   ??? Smoking status: Former Smoker -- 1.00 packs/day     Quit date: 06/27/2011   ??? Smokeless tobacco: Never Used   ??? Alcohol Use: Not on file          Objective:  BP 140/80    Ht 5' 3.5" (1.613 m)    Wt 267 lb (121.11 kg)    BMI 46.55 kg/m2     Heavy 58 yo wf.  In NAD. A&O.  HEENT -- Pupils round.  O/P Clear.  Neck -- Supple. No JVD.no acn  Heart -- RRR. No R/M/G.  Lungs -- CTA.  Abdomen -- Soft. Non-tender. Non-distended. No masses. Bowel sounds present.  Extremities -- No edema.  bilat 1st mcp tenderness.  Dip bony enlargement ( heberdens nodes)    Rt great toe- tender over plantar aspect. No tenderness in web spaces or with compression of mtps    Assessment/Plan:    ICD-9-CM    1. HTN (hypertension) 401.9 PR COLLECTION VENOUS BLOOD,VENIPUNCTURE   2. Hypercholesterolemia 272.0 TSH, 3RD GENERATION     METABOLIC PANEL, COMPREHENSIVE     AMB POC COMPLETE CBC,AUTOMATED ENTER     VITAMIN D, 25 HYDROXY   3. Diabetes 250.00 HEMOGLOBIN A1C   4. Joint pain 719.40 SED RATE (ESR)           Author:  Genene Churn, MD 11/16/2013 2:08 PM    Indications:   Symptomatic relief of large effusion    Procedure:  After consent was obtained, using sterile technique the left 1st mcp joint was prepped using Betadine. Local anesthetic used: 1% lidocaine.. The joint was entered.    Kenalog 10 mg was mixed with 1% lidocaine 0.25 ml  and injected into the joint and the needle withdrawn.  The procedure was well tolerated.  The patient is asked to continue to rest the joint for a few more days before resuming regular activities.  It may be more painful for the first 1-2 days.  Watch for fever, or increased swelling or persistent pain in the joint. Call or return to clinic prn if such symptoms occur or there is failure to improve  as anticipated.

## 2013-11-16 NOTE — Patient Instructions (Addendum)
Avoid processed meats  Take 2 units humalog for bs >120  Take 4 units humalog for bs>180  Take 6 units humalog for bs> 250  Take 8 units humalog for bs>300

## 2013-11-17 LAB — METABOLIC PANEL, COMPREHENSIVE
A-G Ratio: 1.8 (ref 1.1–2.5)
ALT (SGPT): 18 IU/L (ref 0–32)
AST (SGOT): 8 IU/L (ref 0–40)
Albumin: 4.1 g/dL (ref 3.5–5.5)
Alk. phosphatase: 66 IU/L (ref 39–117)
BUN/Creatinine ratio: 28 — ABNORMAL HIGH (ref 9–23)
BUN: 19 mg/dL (ref 6–24)
Bilirubin, total: 0.3 mg/dL (ref 0.0–1.2)
CO2: 24 mmol/L (ref 18–29)
Calcium: 9.3 mg/dL (ref 8.7–10.2)
Chloride: 99 mmol/L (ref 97–108)
Creatinine: 0.67 mg/dL (ref 0.57–1.00)
GFR est AA: 114 mL/min/{1.73_m2} (ref >59)
GFR est non-AA: 99 mL/min/{1.73_m2} (ref >59)
GLOBULIN, TOTAL: 2.3 g/dL (ref 1.5–4.5)
Glucose: 133 mg/dL — ABNORMAL HIGH (ref 65–99)
Potassium: 4.1 mmol/L (ref 3.5–5.2)
Protein, total: 6.4 g/dL (ref 6.0–8.5)
Sodium: 138 mmol/L (ref 134–144)

## 2013-11-17 LAB — SED RATE (ESR): Sed rate (ESR): 25 mm/hr (ref 0–40)

## 2013-11-17 LAB — TSH 3RD GENERATION: TSH: 1.99 u[IU]/mL (ref 0.450–4.500)

## 2013-11-17 LAB — VITAMIN D, 25 HYDROXY: VITAMIN D, 25-HYDROXY: 32.9 ng/mL (ref 30.0–100.0)

## 2013-11-17 LAB — HEMOGLOBIN A1C WITH EAG: Hemoglobin A1c: 7.9 % — ABNORMAL HIGH (ref 4.8–5.6)

## 2013-11-19 NOTE — Progress Notes (Signed)
Quick Note:    pls call- labs do not show reason for aching other than degenerative arthritis. How is your hand? Blood sugars are worse. Work on portion size and no processed meats.  ______

## 2013-11-19 NOTE — Progress Notes (Signed)
Quick Note:    Called patient and relayed doctor's message  ______

## 2013-11-26 MED ORDER — HUMULIN N NPH U-100 INSULIN KWIKPEN 100 UNIT/ML (3 ML) SUBCUTANEOUS
100 unit/mL (3 mL) | SUBCUTANEOUS | Status: DC
Start: 2013-11-26 — End: 2014-06-03

## 2013-12-06 MED ORDER — TRIAMTERENE-HYDROCHLOROTHIAZIDE 37.5 MG-25 MG TAB
ORAL_TABLET | ORAL | Status: DC
Start: 2013-12-06 — End: 2015-04-12

## 2014-02-07 MED ORDER — HUMULIN N PEN 100 UNIT/ML (3 ML) SUBCUTANEOUS
100 unit/mL (3 mL) | INJECTION | SUBCUTANEOUS | Status: DC
Start: 2014-02-07 — End: 2015-05-20

## 2014-04-29 MED ORDER — PREDNISONE 5 MG TABLETS IN A DOSE PACK
5 mg | ORAL_TABLET | ORAL | Status: DC
Start: 2014-04-29 — End: 2015-02-21

## 2014-04-29 NOTE — Telephone Encounter (Signed)
Sent in script for dose pak as requested

## 2014-05-09 MED ORDER — SERTRALINE 100 MG TAB
100 mg | ORAL_TABLET | ORAL | Status: DC
Start: 2014-05-09 — End: 2014-09-21

## 2014-06-04 MED ORDER — HUMULIN N NPH U-100 INSULIN KWIKPEN 100 UNIT/ML (3 ML) SUBCUTANEOUS
100 unit/mL (3 mL) | SUBCUTANEOUS | Status: DC
Start: 2014-06-04 — End: 2014-10-21

## 2014-06-21 MED ORDER — SIMVASTATIN 40 MG TAB
40 mg | ORAL_TABLET | ORAL | Status: DC
Start: 2014-06-21 — End: 2015-02-26

## 2014-07-22 ENCOUNTER — Ambulatory Visit
Admit: 2014-07-22 | Discharge: 2014-07-22 | Payer: PRIVATE HEALTH INSURANCE | Attending: Internal Medicine | Primary: Internal Medicine

## 2014-07-22 DIAGNOSIS — R059 Cough, unspecified: Secondary | ICD-10-CM

## 2014-07-22 LAB — AMB POC COMPLETE CBC,AUTOMATED ENTER
ABS. GRANS (POC): 6.3 10*3/uL (ref 1.4–6.5)
ABS. LYMPHS (POC): 3.1 10*3/uL (ref 1.2–3.4)
ABS. MONOS (POC): 0.2 10*3/uL (ref 0.1–0.6)
GRANULOCYTES (POC): 65.3 % (ref 42.2–75.2)
HCT (POC): 42.9 % (ref 35.0–60.0)
HGB (POC): 13.9 g/dL (ref 11–18)
LYMPHOCYTES (POC): 32.7 % (ref 20.5–51.1)
MCH (POC): 29.3 pg (ref 27.0–31.0)
MCHC (POC): 32.4 g/dL — AB (ref 33.0–37.0)
MCV (POC): 90.4 fL (ref 80.0–99.9)
MONOCYTES (POC): 2 % (ref 1.7–9.3)
MPV (POC): 8.1 fL (ref 7.8–11)
PLATELET (POC): 209 10*3/uL (ref 150–450)
RBC (POC): 4.75 M/uL (ref 4.00–6.00)
RDW (POC): 14.4 % — AB (ref 11.6–13.7)
WBC (POC): 9.6 10*3/uL (ref 4.5–10.5)

## 2014-07-22 NOTE — Progress Notes (Signed)
Dana [name] is a 58 y.o. female and presents with   Chief Complaint   Patient presents with   ??? Cold Symptoms   .  Started feeling bad last Tuesday- - sinus drainage, no fever,       Current Outpatient Prescriptions   Medication Sig Dispense Refill   ??? simvastatin (ZOCOR) 40 mg tablet TAKE 1 TABLET NIGHTLY 90 Tab 2   ??? HUMULIN N KWIKPEN 100 unit/mL (3 mL) pen INJECT 30 UNITS SUBCUTANEOUSLY AT BEDTIME 30 mL 3   ??? sertraline (ZOLOFT) 100 mg tablet TAKE 1 TABLET BY MOUTH DAILY 30 Tab 3   ??? predniSONE (STERAPRED) 5 mg dose pack See administration instruction per 5mg  dose pack 21 Tab 0   ??? HUMULIN N PEN 100 unit/mL (3 mL) pen INJECT 30 UNITS SUBCUTANEOUSLY AT BEDTIME 9 Syringe 0   ??? triamterene-hydrochlorothiazide (MAXZIDE) 37.5-25 mg per tablet TAKE 1 TABLET EVERY DAY 30 Tab 8   ??? losartan (COZAAR) 50 mg tablet TAKE 1 TABLET DAILY 90 Tab 3   ??? lidocaine (LIDODERM) 5 %(700 mg/patch) 1 patch by TransDERmal route every twelve (12) hours. Apply patch to the affected area for 12 hours a day and remove for 12 hours a day. 30 Package 2   ??? metFORMIN ER (GLUCOPHAGE XR) 500 mg tablet TAKE 1 TABLET IN THE MORNING AND 2 TABLETS WITH DINNER 270 tablet 2   ??? Insulin Needles, Disposable, 31 X 5/16 " ndle by IntraMUSCular route daily. 1-2 times daily for insulin  DX 250.00  90 day supply 3 Package 11   ??? ONE TOUCH ULTRA TEST strip USE 1 TO 2 TIMES DAILY 2 Package 3   ??? furosemide (LASIX) 40 mg tablet ONE TWICE A WEEK AS NEEDED FOR FLUID 20 tablet 1   ??? Cholecalciferol, Vitamin D3, (VITAMIN D) 5,000 unit Tab Take  by mouth.       ??? triamterene-hydrochlorothiazide (DYAZIDE) 37.5-25 mg per capsule Take  by mouth daily.       Allergies   Allergen Reactions   ??? Adhesive Itching   ??? Dolobid [Diflunisal] Itching     Itching,swelling     Past Medical History   Diagnosis Date   ??? HTN (hypertension)    ??? Hypercholesterolemia    ??? GERD (gastroesophageal reflux disease)    ??? Diabetes (HCC) 01/24/2013     Past Surgical History    Procedure Laterality Date   ??? Hx hysterectomy       History reviewed. No pertinent family history.  History   Substance Use Topics   ??? Smoking status: Former Smoker -- 1.00 packs/day     Quit date: 06/27/2011   ??? Smokeless tobacco: Never Used   ??? Alcohol Use: Not on file          Objective:  BP 140/90 mmHg   Temp(Src) 97.4 ??F (36.3 ??C)   Ht 5' 3.5" (1.613 m)   Wt 267 lb (121.11 kg)   BMI 46.55 kg/m2  wdwn 58yo wf  In NAD. A&O.  HEENT -- Pupils round.  O/P Clear.  Neck -- Supple. No JVD.  Heart -- RRR. No R/M/G.  Lungs -- wheezing bilat  Abdomen -- Soft. Non-tender. Non-distended. No masses. Bowel sounds present.  Extremities -- No edema.     Results for orders placed or performed in visit on 07/22/14   AMB POC COMPLETE CBC,AUTOMATED ENTER   Result Value Ref Range    WBC (POC) 9.6 4.5 - 10.5 K/uL    LYMPHOCYTES (  POC) 32.7 20.5 - 51.1 %    MONOCYTES (POC) 2.0 1.7 - 9.3 %    GRANULOCYTES (POC) 65.3 42.2 - 75.2 %    ABS. LYMPHS (POC) 3.1 1.2 - 3.4 K/uL    ABS. MONOS (POC) 0.2 0.1 - 0.6 10^3/ul    ABS. GRANS (POC) 6.3 1.4 - 6.5 10^3/ul    RBC (POC) 4.75 4.00 - 6.00 M/uL    HGB (POC) 13.9 11 - 18 g/dL    HCT (POC) 09.842.9 11.935.0 - 60.0 %    MCV (POC) 90.4 80.0 - 99.9 fL    MCH (POC) 29.3 27.0 - 31.0 pg    MCHC (POC) 32.4 (A) 33.0 - 37.0 g/dL    RDW (POC) 14.714.4 (A) 82.911.6 - 13.7 %    PLATELET (POC) 209 150 - 450 K/uL    MPV (POC) 8.1 7.8 - 11 fL          Assessment/Plan:    ICD-10-CM ICD-9-CM    1. Cough R05 786.2 AMB POC COMPLETE CBC,AUTOMATED ENTER     Call in antibiotics if not better, given combivent respimat      Author:  Lake BellsNancy J Delsin Copen, MD 07/22/2014 11:16 AM

## 2014-07-22 NOTE — Patient Instructions (Signed)
Continue mucinex

## 2014-09-19 MED ORDER — LOSARTAN 50 MG TAB
50 mg | ORAL_TABLET | ORAL | Status: DC
Start: 2014-09-19 — End: 2015-05-27

## 2014-09-21 MED ORDER — SERTRALINE 100 MG TAB
100 mg | ORAL_TABLET | ORAL | Status: DC
Start: 2014-09-21 — End: 2016-05-25

## 2014-09-24 MED ORDER — SERTRALINE 100 MG TAB
100 mg | ORAL_TABLET | ORAL | Status: DC
Start: 2014-09-24 — End: 2015-01-22

## 2014-10-22 MED ORDER — HUMULIN N NPH U-100 INSULIN KWIKPEN 100 UNIT/ML (3 ML) SUBCUTANEOUS
100 unit/mL (3 mL) | PEN_INJECTOR | SUBCUTANEOUS | Status: DC
Start: 2014-10-22 — End: 2015-05-20

## 2014-12-11 MED ORDER — METFORMIN SR 500 MG 24 HR TABLET
500 mg | ORAL_TABLET | ORAL | Status: DC
Start: 2014-12-11 — End: 2015-12-10

## 2014-12-25 MED ORDER — HUMULIN N NPH U-100 INSULIN KWIKPEN 100 UNIT/ML (3 ML) SUBCUTANEOUS
100 unit/mL (3 mL) | SUBCUTANEOUS | Status: DC
Start: 2014-12-25 — End: 2015-05-20

## 2015-01-22 MED ORDER — SERTRALINE 100 MG TAB
100 mg | ORAL_TABLET | ORAL | Status: DC
Start: 2015-01-22 — End: 2015-05-24

## 2015-01-31 ENCOUNTER — Ambulatory Visit
Admit: 2015-01-31 | Discharge: 2015-01-31 | Payer: PRIVATE HEALTH INSURANCE | Attending: Internal Medicine | Primary: Internal Medicine

## 2015-01-31 DIAGNOSIS — E7800 Pure hypercholesterolemia, unspecified: Secondary | ICD-10-CM

## 2015-01-31 LAB — AMB POC COMPLETE CBC,AUTOMATED ENTER
ABS. GRANS (POC): 7.1 10*3/uL — AB (ref 1.4–6.5)
ABS. LYMPHS (POC): 3 10*3/uL (ref 1.2–3.4)
ABS. MONOS (POC): 0.5 10*3/uL (ref 0.1–0.6)
GRANULOCYTES (POC): 66.8 % (ref 42.2–75.2)
HCT (POC): 42.6 % (ref 35.0–60.0)
HGB (POC): 13.3 g/dL (ref 11–18)
LYMPHOCYTES (POC): 28.5 % (ref 20.5–51.1)
MCH (POC): 28.6 pg (ref 27.0–31.0)
MCHC (POC): 31.3 g/dL — AB (ref 33.0–37.0)
MCV (POC): 91.4 fL (ref 80.0–99.9)
MONOCYTES (POC): 4.7 % (ref 1.7–9.3)
MPV (POC): 8.1 fL (ref 7.8–11)
PLATELET (POC): 231 10*3/uL (ref 150–450)
RBC (POC): 4.66 M/uL (ref 4.00–6.00)
RDW (POC): 14.7 % — AB (ref 11.6–13.7)
WBC (POC): 10.6 10*3/uL — AB (ref 4.5–10.5)

## 2015-01-31 NOTE — Progress Notes (Signed)
Dana [name] is a 59 y.o. female and presents with   Chief Complaint   Patient presents with   ??? Advice Only   .  Had a basal joint resection by Dr. Marquita Palms- in March.  Has been doing PT, still hurts.  She is frustrated that she can not lose weight. Blood sugars seem to be ok, using cpap every night. Is not exercising.    Also asks about place under left arm that is tender,.  She is overdue for mammogram. Blood sugars before lunch are under 120/  Work has been stressful, but not in a bad way.  She is willing to try to taper zoloft if needed.      Current Outpatient Prescriptions   Medication Sig Dispense Refill   ??? sertraline (ZOLOFT) 100 mg tablet TAKE 1 TABLET BY MOUTH DAILY 30 Tab 3   ??? HUMULIN N KWIKPEN 100 unit/mL (3 mL) inpn INJECT 30 UNITS SUBCUTANEOUSLY AT BEDTIME 30 mL 2   ??? metFORMIN ER (GLUCOPHAGE XR) 500 mg tablet TAKE 1 TABLET EVERY MORNING AND 2 TABLETS WITH DINNER 270 Tab 3   ??? HUMULIN N KWIKPEN 100 unit/mL (3 mL) inpn INJECT 30 UNITS SUBCUTANEOUSLY AT BEDTIME 30 Pen 2   ??? sertraline (ZOLOFT) 100 mg tablet TAKE 1 TABLET BY MOUTH DAILY 30 Tab 3   ??? losartan (COZAAR) 50 mg tablet TAKE 1 TABLET DAILY 90 Tab 2   ??? simvastatin (ZOCOR) 40 mg tablet TAKE 1 TABLET NIGHTLY 90 Tab 2   ??? predniSONE (STERAPRED) 5 mg dose pack See administration instruction per  dose pack 21 Tab 0   ??? HUMULIN N PEN 100 unit/mL (3 mL) pen INJECT 30 UNITS SUBCUTANEOUSLY AT BEDTIME 9 Syringe 0   ??? triamterene-hydrochlorothiazide (MAXZIDE) 37.5-25 mg per tablet TAKE 1 TABLET EVERY DAY 30 Tab 8   ??? lidocaine (LIDODERM) 5 %(700 mg/patch) 1 patch by TransDERmal route every twelve (12) hours. Apply patch to the affected area for 12 hours a day and remove for 12 hours a day. 30 Package 2   ??? Insulin Needles, Disposable, 31 X 5/16 " ndle by IntraMUSCular route daily. 1-2 times daily for insulin  DX 250.00  90 day supply 3 Package 11   ??? ONE TOUCH ULTRA TEST strip USE 1 TO 2 TIMES DAILY 2 Package 3    ??? furosemide (LASIX) 40 mg tablet ONE TWICE A WEEK AS NEEDED FOR FLUID 20 tablet 1   ??? Cholecalciferol, Vitamin D3, (VITAMIN D) 5,000 unit Tab Take  by mouth.       ??? triamterene-hydrochlorothiazide (DYAZIDE) 37.5-25 mg per capsule Take  by mouth daily.       Allergies   Allergen Reactions   ??? Adhesive Itching   ??? Dolobid [Diflunisal] Itching     Itching,swelling     Past Medical History   Diagnosis Date   ??? HTN (hypertension)    ??? Hypercholesterolemia    ??? GERD (gastroesophageal reflux disease)    ??? Diabetes (HCC) 01/24/2013     Past Surgical History   Procedure Laterality Date   ??? Hx hysterectomy       History reviewed. No pertinent family history.  History   Substance Use Topics   ??? Smoking status: Former Smoker -- 1.00 packs/day     Quit date: 06/27/2011   ??? Smokeless tobacco: Never Used   ??? Alcohol Use: Not on file          Objective:  BP 130/80 mmHg   Ht  5' 3.5" (1.613 m)   Wt 264 lb (119.75 kg)   BMI 46.03 kg/m2  Heavy  59 yo wf  In NAD. A&O.  HEENT -- Pupils round.  O/P Clear.  Neck -- Supple. No JVD.  Heart -- RRR. No R/M/G.  Lungs -- CTA. Left axilla- tender fibrous area left upper outer quadrant  Abdomen -- Soft. Non-tender. Non-distended. No masses. Bowel sounds present.  Extremities -- No edema.     Results for orders placed or performed in visit on 01/31/15   AMB POC COMPLETE CBC,AUTOMATED ENTER   Result Value Ref Range    WBC (POC) 10.6 (A) 4.5 - 10.5 K/uL    LYMPHOCYTES (POC) 28.5 20.5 - 51.1 %    MONOCYTES (POC) 4.7 1.7 - 9.3 %    GRANULOCYTES (POC) 66.8 42.2 - 75.2 %    ABS. LYMPHS (POC) 3.0 1.2 - 3.4 K/uL    ABS. MONOS (POC) 0.5 0.1 - 0.6 10^3/ul    ABS. GRANS (POC) 7.1 (A) 1.4 - 6.5 10^3/ul    RBC (POC) 4.66 4.00 - 6.00 M/uL    HGB (POC) 13.3 11 - 18 g/dL    HCT (POC) 16.142.6 09.635.0 - 60.0 %    MCV (POC) 91.4 80.0 - 99.9 fL    MCH (POC) 28.6 27.0 - 31.0 pg    MCHC (POC) 31.3 (A) 33.0 - 37.0 g/dL    RDW (POC) 04.514.7 (A) 40.911.6 - 13.7 %    PLATELET (POC) 231 150 - 450 K/uL    MPV (POC) 8.1 7.8 - 11 fL           Assessment/Plan:    ICD-10-CM ICD-9-CM    1. Hypercholesterolemia E78.0 272.0 PR COLLECTION VENOUS BLOOD,VENIPUNCTURE      METABOLIC PANEL, COMPREHENSIVE      TSH 3RD GENERATION   2. Essential hypertension with goal blood pressure less than 130/80 I10 401.9 AMB POC COMPLETE CBC,AUTOMATED ENTER   3. Type 2 diabetes mellitus with hyperglycemia (HCC) E11.65 250.00 HEMOGLOBIN A1C WITH EAG     Increase metformin  To 4 daily.  Increase exercise.screenibg mammogram      Author:  Lake BellsNancy J Odie Edmonds, MD 01/31/2015 2:23 PM

## 2015-01-31 NOTE — Patient Instructions (Signed)
Increase metformin to 4 daily.    Increase exercise

## 2015-02-01 LAB — METABOLIC PANEL, COMPREHENSIVE
A-G Ratio: 1.6 (ref 1.1–2.5)
ALT (SGPT): 16 IU/L (ref 0–32)
AST (SGOT): 12 IU/L (ref 0–40)
Albumin: 3.9 g/dL (ref 3.5–5.5)
Alk. phosphatase: 69 IU/L (ref 39–117)
BUN/Creatinine ratio: 26 — ABNORMAL HIGH (ref 9–23)
BUN: 17 mg/dL (ref 6–24)
Bilirubin, total: <0.2 mg/dL (ref 0.0–1.2)
CO2: 22 mmol/L (ref 18–29)
Calcium: 8.9 mg/dL (ref 8.7–10.2)
Chloride: 98 mmol/L (ref 97–108)
Creatinine: 0.65 mg/dL (ref 0.57–1.00)
GFR est AA: 114 mL/min/{1.73_m2} (ref >59)
GFR est non-AA: 99 mL/min/{1.73_m2} (ref >59)
GLOBULIN, TOTAL: 2.5 g/dL (ref 1.5–4.5)
Glucose: 159 mg/dL — ABNORMAL HIGH (ref 65–99)
Potassium: 3.8 mmol/L (ref 3.5–5.2)
Protein, total: 6.4 g/dL (ref 6.0–8.5)
Sodium: 139 mmol/L (ref 134–144)

## 2015-02-01 LAB — HEMOGLOBIN A1C WITH EAG
Estimated average glucose: 160 mg/dL
Hemoglobin A1c: 7.2 % — ABNORMAL HIGH (ref 4.8–5.6)

## 2015-02-01 LAB — TSH 3RD GENERATION: TSH: 1.32 u[IU]/mL (ref 0.450–4.500)

## 2015-02-05 NOTE — Progress Notes (Signed)
Quick Note:        Called patient and relayed doctor's message    ______

## 2015-02-05 NOTE — Progress Notes (Signed)
Quick Note:        pls call- aic ok, increase metformin to 4 daily. Thyroid is fine. Schedule mammogram, continue to exercise.    ______

## 2015-02-21 ENCOUNTER — Ambulatory Visit
Admit: 2015-02-21 | Discharge: 2015-02-21 | Payer: PRIVATE HEALTH INSURANCE | Attending: Internal Medicine | Primary: Internal Medicine

## 2015-02-21 ENCOUNTER — Encounter: Attending: Internal Medicine | Primary: Internal Medicine

## 2015-02-21 DIAGNOSIS — E1165 Type 2 diabetes mellitus with hyperglycemia: Secondary | ICD-10-CM

## 2015-02-21 NOTE — Progress Notes (Signed)
Dana [name] is a 59 y.o. female and presents with   Chief Complaint   Patient presents with   ??? Follow-up     3 weeks   .  Pt not checking blood sugars. She is frustrated with her weight.  Has been really stressed with family illnesses and friends' deaths.  No chest pain or sob.   She has more soft stools with the increased metformin.      Current Outpatient Prescriptions   Medication Sig Dispense Refill   ??? sertraline (ZOLOFT) 100 mg tablet TAKE 1 TABLET BY MOUTH DAILY 30 Tab 3   ??? HUMULIN N KWIKPEN 100 unit/mL (3 mL) inpn INJECT 30 UNITS SUBCUTANEOUSLY AT BEDTIME 30 mL 2   ??? metFORMIN ER (GLUCOPHAGE XR) 500 mg tablet TAKE 1 TABLET EVERY MORNING AND 2 TABLETS WITH DINNER (Patient taking differently: two tabs twice a day) 270 Tab 3   ??? HUMULIN N KWIKPEN 100 unit/mL (3 mL) inpn INJECT 30 UNITS SUBCUTANEOUSLY AT BEDTIME 30 Pen 2   ??? sertraline (ZOLOFT) 100 mg tablet TAKE 1 TABLET BY MOUTH DAILY 30 Tab 3   ??? losartan (COZAAR) 50 mg tablet TAKE 1 TABLET DAILY 90 Tab 2   ??? simvastatin (ZOCOR) 40 mg tablet TAKE 1 TABLET NIGHTLY 90 Tab 2   ??? HUMULIN N PEN 100 unit/mL (3 mL) pen INJECT 30 UNITS SUBCUTANEOUSLY AT BEDTIME (Patient taking differently: INJECT 32 units at bedtime.) 9 Syringe 0   ??? triamterene-hydrochlorothiazide (MAXZIDE) 37.5-25 mg per tablet TAKE 1 TABLET EVERY DAY 30 Tab 8   ??? lidocaine (LIDODERM) 5 %(700 mg/patch) 1 patch by TransDERmal route every twelve (12) hours. Apply patch to the affected area for 12 hours a day and remove for 12 hours a day. 30 Package 2   ??? Insulin Needles, Disposable, 31 X 5/16 " ndle by IntraMUSCular route daily. 1-2 times daily for insulin  DX 250.00  90 day supply 3 Package 11   ??? ONE TOUCH ULTRA TEST strip USE 1 TO 2 TIMES DAILY 2 Package 3   ??? furosemide (LASIX) 40 mg tablet ONE TWICE A WEEK AS NEEDED FOR FLUID 20 tablet 1   ??? Cholecalciferol, Vitamin D3, (VITAMIN D) 5,000 unit Tab Take  by mouth.       ??? triamterene-hydrochlorothiazide (DYAZIDE) 37.5-25 mg per capsule Take   by mouth daily.       Allergies   Allergen Reactions   ??? Adhesive Itching   ??? Dolobid [Diflunisal] Itching     Itching,swelling     Past Medical History   Diagnosis Date   ??? HTN (hypertension)    ??? Hypercholesterolemia    ??? GERD (gastroesophageal reflux disease)    ??? Diabetes (HCC) 01/24/2013     Past Surgical History   Procedure Laterality Date   ??? Hx hysterectomy       No family history on file.  History   Substance Use Topics   ??? Smoking status: Former Smoker -- 1.00 packs/day     Quit date: 06/27/2011   ??? Smokeless tobacco: Never Used   ??? Alcohol Use: Not on file          Objective:  BP 100/70 mmHg   Ht 5' 3.5" (1.613 m)   Wt 256 lb (116.121 kg)   BMI 44.63 kg/m2  Heavy 59 yo wf  In NAD. A&O.  HEENT -- Pupils round.  O/P Clear.  Neck -- Supple. No JVD.  Heart -- RRR. No R/M/G.  Lungs --  CTA.  Abdomen -- Soft. Non-tender. Non-distended. No masses. Bowel sounds present.  Extremities -- No edema. Good pulses. No lesions        Assessment/Plan:    ICD-10-CM ICD-9-CM    1. Type 2 diabetes mellitus with hyperglycemia (HCC) E11.65 250.00    2. Hypercholesterolemia E78.0 272.0    3. Essential hypertension with goal blood pressure less than 130/80 I10 401.9      Discussed options- will decrease zoloft to 1/2 and start tanzeum.        Author:  Lake Bells, MD 02/21/2015 3:08 PM

## 2015-02-21 NOTE — Patient Instructions (Signed)
Start tanzeum  Decrease zoloft to 1/2

## 2015-02-26 MED ORDER — SIMVASTATIN 40 MG TAB
40 mg | ORAL_TABLET | ORAL | Status: DC
Start: 2015-02-26 — End: 2015-08-23

## 2015-03-28 ENCOUNTER — Encounter: Attending: Internal Medicine | Primary: Internal Medicine

## 2015-04-01 ENCOUNTER — Ambulatory Visit
Admit: 2015-04-01 | Discharge: 2015-04-01 | Payer: PRIVATE HEALTH INSURANCE | Attending: Internal Medicine | Primary: Internal Medicine

## 2015-04-01 DIAGNOSIS — H9202 Otalgia, left ear: Secondary | ICD-10-CM

## 2015-04-01 LAB — AMB POC COMPLETE CBC,AUTOMATED ENTER
ABS. GRANS (POC): 6.8 10*3/uL — AB (ref 1.4–6.5)
ABS. LYMPHS (POC): 2.6 10*3/uL (ref 1.2–3.4)
ABS. MONOS (POC): 0.3 10*3/uL (ref 0.1–0.6)
GRANULOCYTES (POC): 70.1 % (ref 42.2–75.2)
HCT (POC): 40.8 % (ref 35.0–60.0)
HGB (POC): 13.1 g/dL (ref 11–18)
LYMPHOCYTES (POC): 26.3 % (ref 20.5–51.1)
MCH (POC): 28.8 pg (ref 27.0–31.0)
MCHC (POC): 32 g/dL — AB (ref 33.0–37.0)
MCV (POC): 90 fL (ref 80.0–99.9)
MONOCYTES (POC): 3.6 % (ref 1.7–9.3)
MPV (POC): 7.8 fL (ref 7.8–11)
PLATELET (POC): 223 10*3/uL (ref 150–450)
RBC (POC): 4.54 M/uL (ref 4.00–6.00)
RDW (POC): 146 % — AB (ref 11.6–13.7)
WBC (POC): 9.7 10*3/uL (ref 4.5–10.5)

## 2015-04-01 MED ORDER — AMOXICILLIN CLAVULANATE 875 MG-125 MG TAB
875-125 mg | ORAL_TABLET | Freq: Two times a day (BID) | ORAL | Status: DC
Start: 2015-04-01 — End: 2015-05-20

## 2015-04-01 NOTE — Progress Notes (Signed)
Dana [name] is a 59 y.o. female and presents with   Chief Complaint   Patient presents with   ??? Ear Pain   .  Pt noticed sore throat last weekend, then ear ache.  No fever, sl cough, feels sweaty. Has sinus congestion      Current Outpatient Prescriptions   Medication Sig Dispense Refill   ??? amoxicillin-clavulanate (AUGMENTIN) 875-125 mg per tablet Take 1 Tab by mouth every twelve (12) hours. 14 Tab 0   ??? simvastatin (ZOCOR) 40 mg tablet TAKE 1 TABLET NIGHTLY 90 Tab 1   ??? sertraline (ZOLOFT) 100 mg tablet TAKE 1 TABLET BY MOUTH DAILY 30 Tab 3   ??? HUMULIN N KWIKPEN 100 unit/mL (3 mL) inpn INJECT 30 UNITS SUBCUTANEOUSLY AT BEDTIME 30 mL 2   ??? metFORMIN ER (GLUCOPHAGE XR) 500 mg tablet TAKE 1 TABLET EVERY MORNING AND 2 TABLETS WITH DINNER (Patient taking differently: two tabs twice a day) 270 Tab 3   ??? HUMULIN N KWIKPEN 100 unit/mL (3 mL) inpn INJECT 30 UNITS SUBCUTANEOUSLY AT BEDTIME 30 Pen 2   ??? sertraline (ZOLOFT) 100 mg tablet TAKE 1 TABLET BY MOUTH DAILY 30 Tab 3   ??? losartan (COZAAR) 50 mg tablet TAKE 1 TABLET DAILY 90 Tab 2   ??? HUMULIN N PEN 100 unit/mL (3 mL) pen INJECT 30 UNITS SUBCUTANEOUSLY AT BEDTIME (Patient taking differently: INJECT 32 units at bedtime.) 9 Syringe 0   ??? triamterene-hydrochlorothiazide (MAXZIDE) 37.5-25 mg per tablet TAKE 1 TABLET EVERY DAY 30 Tab 8   ??? lidocaine (LIDODERM) 5 %(700 mg/patch) 1 patch by TransDERmal route every twelve (12) hours. Apply patch to the affected area for 12 hours a day and remove for 12 hours a day. 30 Package 2   ??? Insulin Needles, Disposable, 31 X 5/16 " ndle by IntraMUSCular route daily. 1-2 times daily for insulin  DX 250.00  90 day supply 3 Package 11   ??? ONE TOUCH ULTRA TEST strip USE 1 TO 2 TIMES DAILY 2 Package 3   ??? furosemide (LASIX) 40 mg tablet ONE TWICE A WEEK AS NEEDED FOR FLUID 20 tablet 1   ??? Cholecalciferol, Vitamin D3, (VITAMIN D) 5,000 unit Tab Take  by mouth.       ??? triamterene-hydrochlorothiazide (DYAZIDE) 37.5-25 mg per capsule Take   by mouth daily.       Allergies   Allergen Reactions   ??? Adhesive Itching   ??? Dolobid [Diflunisal] Itching     Itching,swelling     Past Medical History   Diagnosis Date   ??? HTN (hypertension)    ??? Hypercholesterolemia    ??? GERD (gastroesophageal reflux disease)    ??? Diabetes (HCC) 01/24/2013     Past Surgical History   Procedure Laterality Date   ??? Hx hysterectomy       No family history on file.  History   Substance Use Topics   ??? Smoking status: Former Smoker -- 1.00 packs/day     Quit date: 06/27/2011   ??? Smokeless tobacco: Never Used   ??? Alcohol Use: Not on file          Objective:  BP 120/80 mmHg   Temp(Src) 96.8 ??F (36 ??C)   Ht 5' 3.5" (1.613 m)   Wt 261 lb (118.389 kg)   BMI 45.50 kg/m2  wdwn 59 yo wf  In NAD. A&O.  HEENT -- Pupils round.  O/P erythematous  Left tm- occluded with cerumen,   Neck -- Supple. No  JVD.no acn  Heart -- RRR. No R/M/G.  Lungs -- CTA.  Abdomen -- Soft. Non-tender. Non-distended. No masses. Bowel sounds present.  Extremities -- No edema.      Results for orders placed or performed in visit on 04/01/15   AMB POC COMPLETE CBC,AUTOMATED ENTER   Result Value Ref Range    WBC (POC) 9.7 4.5 - 10.5 K/uL    LYMPHOCYTES (POC) 26.3 20.5 - 51.1 %    MONOCYTES (POC) 3.6 1.7 - 9.3 %    GRANULOCYTES (POC) 70.1 42.2 - 75.2 %    ABS. LYMPHS (POC) 2.6 1.2 - 3.4 K/uL    ABS. MONOS (POC) 0.3 0.1 - 0.6 10^3/ul    ABS. GRANS (POC) 6.8 (A) 1.4 - 6.5 10^3/ul    RBC (POC) 4.54 4.00 - 6.00 M/uL    HGB (POC) 13.1 11 - 18 g/dL    HCT (POC) 96.040.8 45.435.0 - 60.0 %    MCV (POC) 90.0 80.0 - 99.9 fL    MCH (POC) 28.8 27.0 - 31.0 pg    MCHC (POC) 32.0 (A) 33.0 - 37.0 g/dL    RDW (POC) 098146 (A) 11.911.6 - 13.7 %    PLATELET (POC) 223 150 - 450 K/uL    MPV (POC) 7.8 7.8 - 11 fL         Assessment/Plan:    ICD-10-CM ICD-9-CM    1. Ear ache, left H92.02 388.70 AMB POC COMPLETE CBC,AUTOMATED ENTER      amoxicillin-clavulanate (AUGMENTIN) 875-125 mg per tablet           Author:  Lake BellsNancy J Shaquala Broeker, MD 04/01/2015 1:37 PM

## 2015-04-01 NOTE — Patient Instructions (Signed)
Call Friday if not better

## 2015-04-04 ENCOUNTER — Ambulatory Visit
Admit: 2015-04-04 | Discharge: 2015-04-04 | Payer: PRIVATE HEALTH INSURANCE | Attending: Internal Medicine | Primary: Internal Medicine

## 2015-04-04 DIAGNOSIS — H6692 Otitis media, unspecified, left ear: Secondary | ICD-10-CM

## 2015-04-04 MED ORDER — LEVOFLOXACIN 750 MG TAB
750 mg | ORAL_TABLET | Freq: Every day | ORAL | Status: DC
Start: 2015-04-04 — End: 2016-05-25

## 2015-04-04 MED ORDER — METHYLPREDNISOLONE 4 MG TABS IN A DOSE PACK
4 mg | ORAL | Status: AC
Start: 2015-04-04 — End: 2015-10-01

## 2015-04-04 NOTE — Patient Instructions (Signed)
Stop augmentin, start levaquin and steroid

## 2015-04-04 NOTE — Progress Notes (Signed)
Dana [name] is a 59 y.o. female and presents with   Chief Complaint   Patient presents with   ??? Ear Pain   .  Pt still having left ear pain and no hearing in that ear.    Has rash on back- does not itch.      Current Outpatient Prescriptions   Medication Sig Dispense Refill   ??? methylPREDNISolone (MEDROL, PAK,) 4 mg tablet As directed 1 Dose Pack 0   ??? levofloxacin (LEVAQUIN) 750 mg tablet Take 1 Tab by mouth daily. 5 Tab 0   ??? amoxicillin-clavulanate (AUGMENTIN) 875-125 mg per tablet Take 1 Tab by mouth every twelve (12) hours. 14 Tab 0   ??? simvastatin (ZOCOR) 40 mg tablet TAKE 1 TABLET NIGHTLY 90 Tab 1   ??? sertraline (ZOLOFT) 100 mg tablet TAKE 1 TABLET BY MOUTH DAILY 30 Tab 3   ??? HUMULIN N KWIKPEN 100 unit/mL (3 mL) inpn INJECT 30 UNITS SUBCUTANEOUSLY AT BEDTIME 30 mL 2   ??? metFORMIN ER (GLUCOPHAGE XR) 500 mg tablet TAKE 1 TABLET EVERY MORNING AND 2 TABLETS WITH DINNER (Patient taking differently: two tabs twice a day) 270 Tab 3   ??? HUMULIN N KWIKPEN 100 unit/mL (3 mL) inpn INJECT 30 UNITS SUBCUTANEOUSLY AT BEDTIME 30 Pen 2   ??? sertraline (ZOLOFT) 100 mg tablet TAKE 1 TABLET BY MOUTH DAILY 30 Tab 3   ??? losartan (COZAAR) 50 mg tablet TAKE 1 TABLET DAILY 90 Tab 2   ??? HUMULIN N PEN 100 unit/mL (3 mL) pen INJECT 30 UNITS SUBCUTANEOUSLY AT BEDTIME (Patient taking differently: INJECT 32 units at bedtime.) 9 Syringe 0   ??? triamterene-hydrochlorothiazide (MAXZIDE) 37.5-25 mg per tablet TAKE 1 TABLET EVERY DAY 30 Tab 8   ??? lidocaine (LIDODERM) 5 %(700 mg/patch) 1 patch by TransDERmal route every twelve (12) hours. Apply patch to the affected area for 12 hours a day and remove for 12 hours a day. 30 Package 2   ??? Insulin Needles, Disposable, 31 X 5/16 " ndle by IntraMUSCular route daily. 1-2 times daily for insulin  DX 250.00  90 day supply 3 Package 11   ??? ONE TOUCH ULTRA TEST strip USE 1 TO 2 TIMES DAILY 2 Package 3   ??? furosemide (LASIX) 40 mg tablet ONE TWICE A WEEK AS NEEDED FOR FLUID 20 tablet 1    ??? Cholecalciferol, Vitamin D3, (VITAMIN D) 5,000 unit Tab Take  by mouth.       ??? triamterene-hydrochlorothiazide (DYAZIDE) 37.5-25 mg per capsule Take  by mouth daily.       Allergies   Allergen Reactions   ??? Adhesive Itching   ??? Dolobid [Diflunisal] Itching     Itching,swelling     Past Medical History   Diagnosis Date   ??? HTN (hypertension)    ??? Hypercholesterolemia    ??? GERD (gastroesophageal reflux disease)    ??? Diabetes (HCC) 01/24/2013     Past Surgical History   Procedure Laterality Date   ??? Hx hysterectomy       No family history on file.  History   Substance Use Topics   ??? Smoking status: Former Smoker -- 1.00 packs/day     Quit date: 06/27/2011   ??? Smokeless tobacco: Never Used   ??? Alcohol Use: Not on file          Objective:  BP 130/90 mmHg  wdwn 59 yo wf  In NAD. A&O.  HEENT -- Pupils round.  O/P Clear. Left tm- erythematous,  irreg  Neck -- Supple. No JVD.  Heart -- RRR. No R/M/G.  Lungs -- CTA.  Abdomen -- Soft. Non-tender. Non-distended. No masses. Bowel sounds present.  Extremities -- No edema.    Upper back- erythematous raised areas    Assessment/Plan:    ICD-10-CM ICD-9-CM    1. Left otitis media, unspecified chronicity, unspecified otitis media type H66.92 382.9 methylPREDNISolone (MEDROL, PAK,) 4 mg tablet      levofloxacin (LEVAQUIN) 750 mg tablet     Continue warm compresses, call Tuesday if not better      Author:  Lake BellsNancy J Dailyn Reith, MD 04/04/2015 12:47 PM

## 2015-04-09 ENCOUNTER — Encounter

## 2015-04-09 NOTE — Telephone Encounter (Signed)
Patient is requesting referral to ENT, ear is better, but still can't hear, Breast BX neg she is so excited and HAPPY

## 2015-04-09 NOTE — Telephone Encounter (Signed)
Ok I will send her to Dr. Jaynie CollinsLIm

## 2015-04-11 ENCOUNTER — Encounter: Attending: Internal Medicine | Primary: Internal Medicine

## 2015-04-14 MED ORDER — TRIAMTERENE-HYDROCHLOROTHIAZIDE 37.5 MG-25 MG TAB
ORAL_TABLET | ORAL | Status: DC
Start: 2015-04-14 — End: 2016-04-08

## 2015-04-25 ENCOUNTER — Encounter: Attending: Internal Medicine | Primary: Internal Medicine

## 2015-05-20 ENCOUNTER — Ambulatory Visit: Admit: 2015-05-20 | Payer: PRIVATE HEALTH INSURANCE | Attending: Internal Medicine | Primary: Internal Medicine

## 2015-05-20 ENCOUNTER — Inpatient Hospital Stay: Admit: 2015-05-20 | Payer: BLUE CROSS/BLUE SHIELD | Attending: Internal Medicine | Primary: Internal Medicine

## 2015-05-20 DIAGNOSIS — R05 Cough: Secondary | ICD-10-CM

## 2015-05-20 DIAGNOSIS — R059 Cough, unspecified: Secondary | ICD-10-CM

## 2015-05-20 LAB — AMB POC COMPLETE CBC,AUTOMATED ENTER
ABS. GRANS (POC): 6.8 10*3/uL — AB (ref 1.4–6.5)
ABS. LYMPHS (POC): 2.6 10*3/uL (ref 1.2–3.4)
ABS. MONOS (POC): 0.1 10*3/uL (ref 0.1–0.6)
GRANULOCYTES (POC): 71.1 % (ref 42.2–75.2)
HCT (POC): 41.2 % (ref 35.0–60.0)
HGB (POC): 13.3 g/dL (ref 11–18)
LYMPHOCYTES (POC): 27.5 % (ref 20.5–51.1)
MCH (POC): 28.8 pg (ref 27.0–31.0)
MCHC (POC): 32.4 g/dL — AB (ref 33.0–37.0)
MCV (POC): 89 fL (ref 80.0–99.9)
MONOCYTES (POC): 1.4 % — AB (ref 1.7–9.3)
MPV (POC): 8 fL (ref 7.8–11)
PLATELET (POC): 234 10*3/uL (ref 150–450)
RBC (POC): 4.63 M/uL (ref 4.00–6.00)
RDW (POC): 15.3 % — AB (ref 11.6–13.7)
WBC (POC): 9.6 10*3/uL (ref 4.5–10.5)

## 2015-05-20 MED ORDER — AMOXICILLIN CLAVULANATE 875 MG-125 MG TAB
875-125 mg | ORAL_TABLET | Freq: Two times a day (BID) | ORAL | 0 refills | Status: DC
Start: 2015-05-20 — End: 2016-05-25

## 2015-05-20 MED ORDER — INSULIN NPH HUMAN RECOMB 100 UNIT/ML (3 ML) SUB-Q PEN
100 unit/mL (3 mL) | SUBCUTANEOUS | 2 refills | Status: DC
Start: 2015-05-20 — End: 2016-03-05

## 2015-05-20 NOTE — Patient Instructions (Addendum)
Results for orders placed or performed in visit on 05/20/15   AMB POC COMPLETE CBC,AUTOMATED ENTER   Result Value Ref Range    WBC (POC) 9.6 4.5 - 10.5 K/uL    LYMPHOCYTES (POC) 27.5 20.5 - 51.1 %    MONOCYTES (POC) 1.4 (A) 1.7 - 9.3 %    GRANULOCYTES (POC) 71.1 42.2 - 75.2 %    ABS. LYMPHS (POC) 2.6 1.2 - 3.4 K/uL    ABS. MONOS (POC) 0.1 0.1 - 0.6 10^3/ul    ABS. GRANS (POC) 6.8 (A) 1.4 - 6.5 10^3/ul    RBC (POC) 4.63 4.00 - 6.00 M/uL    HGB (POC) 13.3 11 - 18 g/dL    HCT (POC) 16.1 09.6 - 60.0 %    MCV (POC) 89.0 80.0 - 99.9 fL    MCH (POC) 28.8 27.0 - 31.0 pg    MCHC (POC) 32.4 (A) 33.0 - 37.0 g/dL    RDW (POC) 04.5 (A) 40.9 - 13.7 %    PLATELET (POC) 234 150 - 450 K/uL    MPV (POC) 8.0 7.8 - 11 fL     mucinex twice a day

## 2015-05-20 NOTE — Progress Notes (Signed)
Dana [name] is a 59 y.o. female and presents with   Chief Complaint   Patient presents with   ??? Breathing Problem   .  Got better from otitis in July  and stayed well, until last week started with sore throat and ear ache.  Sweats and cough. No fever.  Cough productive of" nasty tasting" phlegm.  No chest pain,  + wheezing. Cough wakes her up  Needs a refill on insulins - not checking blood sugars      Current Outpatient Prescriptions   Medication Sig Dispense Refill   ??? amoxicillin-clavulanate (AUGMENTIN) 875-125 mg per tablet Take 1 Tab by mouth every twelve (12) hours. 14 Tab 0   ??? triamterene-hydrochlorothiazide (MAXZIDE) 37.5-25 mg per tablet TAKE 1 TABLET DAILY 90 Tab 3   ??? methylPREDNISolone (MEDROL, PAK,) 4 mg tablet As directed 1 Dose Pack 0   ??? levofloxacin (LEVAQUIN) 750 mg tablet Take 1 Tab by mouth daily. 5 Tab 0   ??? simvastatin (ZOCOR) 40 mg tablet TAKE 1 TABLET NIGHTLY 90 Tab 1   ??? sertraline (ZOLOFT) 100 mg tablet TAKE 1 TABLET BY MOUTH DAILY 30 Tab 3   ??? HUMULIN N KWIKPEN 100 unit/mL (3 mL) inpn INJECT 30 UNITS SUBCUTANEOUSLY AT BEDTIME 30 mL 2   ??? metFORMIN ER (GLUCOPHAGE XR) 500 mg tablet TAKE 1 TABLET EVERY MORNING AND 2 TABLETS WITH DINNER (Patient taking differently: two tabs twice a day) 270 Tab 3   ??? HUMULIN N KWIKPEN 100 unit/mL (3 mL) inpn INJECT 30 UNITS SUBCUTANEOUSLY AT BEDTIME 30 Pen 2   ??? sertraline (ZOLOFT) 100 mg tablet TAKE 1 TABLET BY MOUTH DAILY 30 Tab 3   ??? losartan (COZAAR) 50 mg tablet TAKE 1 TABLET DAILY 90 Tab 2   ??? HUMULIN N PEN 100 unit/mL (3 mL) pen INJECT 30 UNITS SUBCUTANEOUSLY AT BEDTIME (Patient taking differently: INJECT 32 units at bedtime.) 9 Syringe 0   ??? lidocaine (LIDODERM) 5 %(700 mg/patch) 1 patch by TransDERmal route every twelve (12) hours. Apply patch to the affected area for 12 hours a day and remove for 12 hours a day. 30 Package 2   ??? Insulin Needles, Disposable, 31 X 5/16 " ndle by IntraMUSCular route  daily. 1-2 times daily for insulin  DX 250.00  90 day supply 3 Package 11   ??? ONE TOUCH ULTRA TEST strip USE 1 TO 2 TIMES DAILY 2 Package 3   ??? furosemide (LASIX) 40 mg tablet ONE TWICE A WEEK AS NEEDED FOR FLUID 20 tablet 1   ??? Cholecalciferol, Vitamin D3, (VITAMIN D) 5,000 unit Tab Take  by mouth.       ??? triamterene-hydrochlorothiazide (DYAZIDE) 37.5-25 mg per capsule Take  by mouth daily.       Allergies   Allergen Reactions   ??? Adhesive Itching   ??? Dolobid [Diflunisal] Itching     Itching,swelling     Past Medical History   Diagnosis Date   ??? Diabetes (HCC) 01/24/2013   ??? GERD (gastroesophageal reflux disease)    ??? HTN (hypertension)    ??? Hypercholesterolemia      Past Surgical History   Procedure Laterality Date   ??? Hx hysterectomy       No family history on file.  Social History   Substance Use Topics   ??? Smoking status: Former Smoker     Packs/day: 1.00     Quit date: 06/27/2011   ??? Smokeless tobacco: Never Used   ??? Alcohol use Not  on file          Objective:  Visit Vitals   ??? BP 120/80   ??? Pulse 87   ??? Temp 98.4 ??F (36.9 ??C)   ??? Ht 5' 3.5" (1.613 m)   ??? Wt 253 lb (114.8 kg)   ??? SpO2 94%   ??? BMI 44.11 kg/m2     Heavy 59 wf-  In NAD. A&O.  HEENT -- Pupils round.  O/P Clear. Right tm- pink, left intact  Neck -- Supple. No JVD.  Heart -- RRR. No R/M/G.  Lungs -- bilat wheezing  Abdomen -- Soft. Non-tender. Non-distended. No masses. Bowel sounds present.  Extremities -- No edema.      Results for orders placed or performed in visit on 05/20/15   AMB POC COMPLETE CBC,AUTOMATED ENTER   Result Value Ref Range    WBC (POC) 9.6 4.5 - 10.5 K/uL    LYMPHOCYTES (POC) 27.5 20.5 - 51.1 %    MONOCYTES (POC) 1.4 (A) 1.7 - 9.3 %    GRANULOCYTES (POC) 71.1 42.2 - 75.2 %    ABS. LYMPHS (POC) 2.6 1.2 - 3.4 K/uL    ABS. MONOS (POC) 0.1 0.1 - 0.6 10^3/ul    ABS. GRANS (POC) 6.8 (A) 1.4 - 6.5 10^3/ul    RBC (POC) 4.63 4.00 - 6.00 M/uL    HGB (POC) 13.3 11 - 18 g/dL    HCT (POC) 16.1 09.6 - 60.0 %    MCV (POC) 89.0 80.0 - 99.9 fL     MCH (POC) 28.8 27.0 - 31.0 pg    MCHC (POC) 32.4 (A) 33.0 - 37.0 g/dL    RDW (POC) 04.5 (A) 40.9 - 13.7 %    PLATELET (POC) 234 150 - 450 K/uL    MPV (POC) 8.0 7.8 - 11 fL         Assessment/Plan:    ICD-10-CM ICD-9-CM    1. Cough R05 786.2 XR CHEST PA LAT      AMB POC COMPLETE CBC,AUTOMATED ENTER      amoxicillin-clavulanate (AUGMENTIN) 875-125 mg per tablet   2. Ear ache, left H92.02 388.70 amoxicillin-clavulanate (AUGMENTIN) 875-125 mg per tablet     advair discus sample==mucinex, call if not better, will use duonebs then      Author:  Lake Bells, MD 05/20/2015 1:59 PM

## 2015-05-21 NOTE — Telephone Encounter (Signed)
Return call, patient slept better last night, feels like the combination of drugs

## 2015-05-23 NOTE — Telephone Encounter (Signed)
Pt wants to talk to karen  Pt ph # [phone]

## 2015-05-23 NOTE — Telephone Encounter (Signed)
Not doing much better will start the neb treatments every 4 hrs. Inhaler not helping

## 2015-05-25 MED ORDER — SERTRALINE 100 MG TAB
100 mg | ORAL_TABLET | ORAL | 3 refills | Status: DC
Start: 2015-05-25 — End: 2015-10-01

## 2015-05-27 MED ORDER — LOSARTAN 50 MG TAB
50 mg | ORAL_TABLET | ORAL | 1 refills | Status: DC
Start: 2015-05-27 — End: 2015-11-21

## 2015-08-07 MED ORDER — BD ULTRA-FINE NANO PEN NEEDLE 32 GAUGE X 5/32"
32 gauge x 5/" | PEN_INJECTOR | 1 refills | Status: DC
Start: 2015-08-07 — End: 2015-09-05

## 2015-08-24 MED ORDER — SIMVASTATIN 40 MG TAB
40 mg | ORAL_TABLET | ORAL | 0 refills | Status: DC
Start: 2015-08-24 — End: 2015-11-20

## 2015-09-05 MED ORDER — INSULIN NEEDLES (DISPOSABLE) 31 X 5/16"
31 gauge x 5/16" | PACK | Freq: Four times a day (QID) | 11 refills | Status: DC
Start: 2015-09-05 — End: 2017-05-11

## 2015-09-05 NOTE — Telephone Encounter (Signed)
Sent in new script for needles

## 2015-10-01 MED ORDER — SERTRALINE 100 MG TAB
100 mg | ORAL_TABLET | ORAL | 3 refills | Status: DC
Start: 2015-10-01 — End: 2016-02-20

## 2015-11-17 NOTE — Telephone Encounter (Signed)
Returned call,  Larey SeatFell and broke her foot,

## 2015-11-20 MED ORDER — SIMVASTATIN 40 MG TAB
40 mg | ORAL_TABLET | ORAL | 1 refills | Status: DC
Start: 2015-11-20 — End: 2016-05-18

## 2015-11-20 NOTE — Telephone Encounter (Signed)
i am sending in refill.  She was due for her PE in Sept.  pls schedule for next available

## 2015-11-21 MED ORDER — LOSARTAN 50 MG TAB
50 mg | ORAL_TABLET | ORAL | 0 refills | Status: DC
Start: 2015-11-21 — End: 2016-02-19

## 2015-12-10 MED ORDER — METFORMIN SR 500 MG 24 HR TABLET
500 mg | ORAL_TABLET | ORAL | 2 refills | Status: DC
Start: 2015-12-10 — End: 2016-09-05

## 2016-02-19 MED ORDER — LOSARTAN 50 MG TAB
50 mg | ORAL_TABLET | ORAL | 0 refills | Status: DC
Start: 2016-02-19 — End: 2016-05-19

## 2016-02-19 NOTE — Telephone Encounter (Signed)
pls call- patient overdue for follow up.  pls schedule visit, then we can schedule PE

## 2016-02-20 MED ORDER — SERTRALINE 100 MG TAB
100 mg | ORAL_TABLET | ORAL | 3 refills | Status: DC
Start: 2016-02-20 — End: 2017-04-20

## 2016-03-05 MED ORDER — HUMULIN N NPH U-100 INSULIN KWIKPEN 100 UNIT/ML (3 ML) SUBCUTANEOUS
100 unit/mL (3 mL) | SUBCUTANEOUS | 2 refills | Status: DC
Start: 2016-03-05 — End: 2017-03-15

## 2016-04-08 MED ORDER — TRIAMTERENE-HYDROCHLOROTHIAZIDE 37.5 MG-25 MG TAB
ORAL_TABLET | ORAL | 2 refills | Status: DC
Start: 2016-04-08 — End: 2017-01-03

## 2016-05-18 MED ORDER — SIMVASTATIN 40 MG TAB
40 mg | ORAL_TABLET | ORAL | 1 refills | Status: DC
Start: 2016-05-18 — End: 2016-11-14

## 2016-05-18 NOTE — Telephone Encounter (Signed)
pls call- patient overdue to be seen.

## 2016-05-18 NOTE — Telephone Encounter (Signed)
Patient called back will see next Tuesday

## 2016-05-18 NOTE — Telephone Encounter (Signed)
Called patient and relayed doctor's message

## 2016-05-19 MED ORDER — LOSARTAN 50 MG TAB
50 mg | ORAL_TABLET | ORAL | 0 refills | Status: DC
Start: 2016-05-19 — End: 2016-08-17

## 2016-05-25 ENCOUNTER — Ambulatory Visit
Admit: 2016-05-25 | Discharge: 2016-05-25 | Payer: PRIVATE HEALTH INSURANCE | Attending: Internal Medicine | Primary: Internal Medicine

## 2016-05-25 DIAGNOSIS — E1165 Type 2 diabetes mellitus with hyperglycemia: Secondary | ICD-10-CM

## 2016-05-25 LAB — AMB POC URINALYSIS DIP STICK MANUAL W/O MICRO
Bilirubin (UA POC): NEGATIVE
Blood (UA POC): NEGATIVE
Glucose (UA POC): NEGATIVE
Ketones (UA POC): NEGATIVE
Leukocyte esterase (UA POC): NEGATIVE
Nitrites (UA POC): NEGATIVE
Protein (UA POC): NEGATIVE mg/dL
Specific gravity (UA POC): 1.025 (ref 1.001–1.035)
Urobilinogen (UA POC): NORMAL (ref 0.2–1)
pH (UA POC): 5.5 (ref 4.6–8.0)

## 2016-05-25 LAB — AMB POC LIPID PROFILE
Cholesterol (POC): 155 mg/dL (ref 100–199)
HDL Cholesterol (POC): 40 mg/dL (ref 35–150)
LDL Cholesterol (POC): 73 mg/dL (ref 0–129)
Non-HDL Goal (POC): 114
TChol/HDL Ratio (POC): 3.8 CALC (ref 0.0–5.0)
Triglycerides (POC): 207 mg/dL — AB (ref 0–150)

## 2016-05-25 LAB — AMB POC HEMOGLOBIN A1C: Hemoglobin A1c (POC): 6.8 % — AB (ref 4.8–5.6)

## 2016-05-25 NOTE — Patient Instructions (Signed)
Results for orders placed or performed in visit on 05/25/16   AMB POC URINALYSIS DIP STICK MANUAL W/O MICRO   Result Value Ref Range    Color (UA POC) Yellow     Clarity (UA POC) Clear     Glucose (UA POC) Negative Negative    Bilirubin (UA POC) Negative Negative    Ketones (UA POC) Negative Negative    Specific gravity (UA POC) 1.025 1.001 - 1.035    Blood (UA POC) Negative Negative    pH (UA POC) 5.5 4.6 - 8.0    Protein (UA POC) Negative Negative mg/dL    Urobilinogen (UA POC) normal 0.2 - 1    Nitrites (UA POC) Negative Negative    Leukocyte esterase (UA POC) Negative Negative   AMB POC HEMOGLOBIN A1C   Result Value Ref Range    Hemoglobin A1c (POC) 6.8 (A) 4.8 - 5.6 %   AMB POC LIPID PROFILE   Result Value Ref Range    Cholesterol (POC) 155 100 - 199 mg/dL    Triglycerides (POC) 207 (A) 0 - 150 mg/dL    HDL Cholesterol (POC) 40 35 - 150 mg/dL    LDL Cholesterol (POC) 73 0 - 129 mg/dL    Non-HDL Goal (POC) 161114     TChol/HDL Ratio (POC) 3.8 0.0 - 5.0 CALC

## 2016-05-25 NOTE — Progress Notes (Signed)
Dana [name] is a 60 y.o. female and presents with   Chief Complaint   Patient presents with   ??? Follow-up   .  Patient c/o pain in lower back and foot.  Has not been checking blood sugars. Leaving for vacation this Sunday.  Denies chest pain or sob      Current Outpatient Prescriptions   Medication Sig Dispense Refill   ??? losartan (COZAAR) 50 mg tablet TAKE 1 TABLET DAILY 90 Tab 0   ??? simvastatin (ZOCOR) 40 mg tablet TAKE 1 TABLET NIGHTLY 90 Tab 1   ??? triamterene-hydroCHLOROthiazide (MAXZIDE) 37.5-25 mg per tablet TAKE 1 TABLET DAILY 90 Tab 2   ??? HUMULIN N KWIKPEN 100 unit/mL (3 mL) inpn INJECT 32 UNITS SUBCUTANEOUSLY AT BEDTIME 30 Adjustable Dose Pre-filled Pen Syringe 2   ??? sertraline (ZOLOFT) 100 mg tablet TAKE 1 TABLET BY MOUTH DAILY 30 Tab 3   ??? metFORMIN ER (GLUCOPHAGE XR) 500 mg tablet TAKE 1 TABLET EVERY MORNING AND 2 TABLETS WITH DINNER (Patient taking differently: 2 tabs bid) 270 Tab 2   ??? Insulin Needles, Disposable, 31 gauge x 5/16" ndle by IntraDERMal route four (4) times daily. Test blood sugars 4 times daily DX:  E11.65 100 Package 11   ??? lidocaine (LIDODERM) 5 %(700 mg/patch) 1 patch by TransDERmal route every twelve (12) hours. Apply patch to the affected area for 12 hours a day and remove for 12 hours a day. 30 Package 2   ??? Insulin Needles, Disposable, 31 X 5/16 " ndle by IntraMUSCular route daily. 1-2 times daily for insulin  DX 250.00  90 day supply 3 Package 11   ??? ONE TOUCH ULTRA TEST strip USE 1 TO 2 TIMES DAILY 2 Package 3   ??? furosemide (LASIX) 40 mg tablet ONE TWICE A WEEK AS NEEDED FOR FLUID 20 tablet 1   ??? Cholecalciferol, Vitamin D3, (VITAMIN D) 5,000 unit Tab Take  by mouth.       ??? triamterene-hydrochlorothiazide (DYAZIDE) 37.5-25 mg per capsule Take  by mouth daily.       Allergies   Allergen Reactions   ??? Adhesive Itching   ??? Dolobid [Diflunisal] Itching     Itching,swelling     Past Medical History:   Diagnosis Date   ??? Diabetes (HCC) 01/24/2013    ??? GERD (gastroesophageal reflux disease)    ??? HTN (hypertension)    ??? Hypercholesterolemia      Past Surgical History:   Procedure Laterality Date   ??? HX HYSTERECTOMY       No family history on file.  Social History   Substance Use Topics   ??? Smoking status: Former Smoker     Packs/day: 1.00     Quit date: 06/27/2011   ??? Smokeless tobacco: Never Used   ??? Alcohol use Not on file      The patient has a history of falls. A plan of care for falls was documented..  Depression screen positive, treated.      Objective:  Visit Vitals   ??? BP 120/70   ??? Ht 5' 3.5" (1.613 m)   ??? Wt 250 lb (113.4 kg)   ??? BMI 43.59 kg/m2     Heavy 60 yo wf  In NAD. A&O.  HEENT -- Pupils round.  O/P Clear.  Neck -- Supple. No JVD.  Heart -- RRR. No R/M/G.  Lungs -- CTA.  Abdomen -- Soft. Non-tender. Non-distended. No masses. Bowel sounds present.  Extremities -- No edema. Good  pulses      Results for orders placed or performed in visit on 05/25/16   AMB POC URINALYSIS DIP STICK MANUAL W/O MICRO   Result Value Ref Range    Color (UA POC) Yellow     Clarity (UA POC) Clear     Glucose (UA POC) Negative Negative    Bilirubin (UA POC) Negative Negative    Ketones (UA POC) Negative Negative    Specific gravity (UA POC) 1.025 1.001 - 1.035    Blood (UA POC) Negative Negative    pH (UA POC) 5.5 4.6 - 8.0    Protein (UA POC) Negative Negative mg/dL    Urobilinogen (UA POC) normal 0.2 - 1    Nitrites (UA POC) Negative Negative    Leukocyte esterase (UA POC) Negative Negative   AMB POC HEMOGLOBIN A1C   Result Value Ref Range    Hemoglobin A1c (POC) 6.8 (A) 4.8 - 5.6 %   AMB POC LIPID PROFILE   Result Value Ref Range    Cholesterol (POC) 155 100 - 199 mg/dL    Triglycerides (POC) 207 (A) 0 - 150 mg/dL    HDL Cholesterol (POC) 40 35 - 150 mg/dL    LDL Cholesterol (POC) 73 0 - 129 mg/dL    Non-HDL Goal (POC) 161114     TChol/HDL Ratio (POC) 3.8 0.0 - 5.0 CALC         Assessment/Plan:    ICD-10-CM ICD-9-CM     1. Type 2 diabetes mellitus with hyperglycemia, with long-term current use of insulin (HCC) E11.65 250.00 AMB POC URINALYSIS DIP STICK MANUAL W/O MICRO    Z79.4 790.29 AMB POC HEMOGLOBIN A1C     V58.67 MICROALBUMIN, UR, RAND W/ MICROALBUMIN/CREA RATIO   2. Need for influenza vaccination Z23 V04.81 INFLUENZA VIRUS VACCINE, PRESERVATIVE FREE SYRINGE, 3 YRS AND OLDER   3. Hypercholesterolemia E78.00 272.0 PR COLLECTION VENOUS BLOOD,VENIPUNCTURE      AMB POC LIPID PROFILE   4. Morbid obesity due to excess calories (HCC) E66.01 278.01    5. Hypertension complicating diabetes (HCC) E11.59 250.80     I10 401.9            Author:  Lake BellsNancy J Giovonnie Trettel, MD 05/25/2016 3:09 PM

## 2016-05-26 LAB — MICROALBUMIN, UR, RAND W/ MICROALB/CREAT RATIO
Creatinine, urine random: 88.3 mg/dL
Microalb/Creat ratio (ug/mg creat.): <3.4 mg/g creat (ref 0.0–30.0)
Microalbumin, urine: <3 ug/mL

## 2016-06-01 NOTE — Progress Notes (Signed)
Called patient and relayed doctor's message

## 2016-06-01 NOTE — Progress Notes (Signed)
pls call- continue to work on diet and exercise.  No significant protein in urine,

## 2016-08-17 NOTE — Telephone Encounter (Signed)
pls have patient schedule physical

## 2016-08-17 NOTE — Telephone Encounter (Signed)
Called patient told her she needs to make appointment for a physical

## 2016-08-20 MED ORDER — LOSARTAN 50 MG TAB
50 mg | ORAL_TABLET | ORAL | 0 refills | Status: DC
Start: 2016-08-20 — End: 2016-11-15

## 2016-09-05 MED ORDER — METFORMIN SR 500 MG 24 HR TABLET
500 mg | ORAL_TABLET | ORAL | 2 refills | Status: DC
Start: 2016-09-05 — End: 2017-05-11

## 2016-09-24 NOTE — Telephone Encounter (Signed)
Called patient and left message awaiting response

## 2016-10-29 ENCOUNTER — Ambulatory Visit: Admit: 2016-10-29 | Payer: PRIVATE HEALTH INSURANCE | Attending: Internal Medicine | Primary: Internal Medicine

## 2016-10-29 DIAGNOSIS — E7800 Pure hypercholesterolemia, unspecified: Secondary | ICD-10-CM

## 2016-10-29 LAB — AMB POC URINALYSIS DIP STICK MANUAL W/O MICRO
Bilirubin (UA POC): NEGATIVE
Glucose (UA POC): NEGATIVE
Ketones (UA POC): NEGATIVE
Leukocyte esterase (UA POC): NEGATIVE
Nitrites (UA POC): NEGATIVE
Protein (UA POC): NEGATIVE
Specific gravity (UA POC): 1.025 (ref 1.001–1.035)
Urobilinogen (UA POC): 0.2 (ref 0.2–1)
pH (UA POC): 5.5 (ref 4.6–8.0)

## 2016-10-29 LAB — AMB POC HEMOGLOBIN A1C: Hemoglobin A1c (POC): 7.1 % — AB (ref 4.8–5.6)

## 2016-10-29 NOTE — Patient Instructions (Signed)
Results for orders placed or performed in visit on 10/29/16   AMB POC URINALYSIS DIP STICK MANUAL W/O MICRO   Result Value Ref Range    Color (UA POC) Yellow     Clarity (UA POC) Clear     Glucose (UA POC) Negative Negative    Bilirubin (UA POC) Negative Negative    Ketones (UA POC) Negative Negative    Specific gravity (UA POC) 1.025 1.001 - 1.035    Blood (UA POC) Trace Negative    pH (UA POC) 5.5 4.6 - 8.0    Protein (UA POC) Negative Negative    Urobilinogen (UA POC) 0.2 mg/dL 0.2 - 1    Nitrites (UA POC) Negative Negative    Leukocyte esterase (UA POC) Negative Negative   AMB POC HEMOGLOBIN A1C   Result Value Ref Range    Hemoglobin A1c (POC) 7.1 (A) 4.8 - 5.6 %

## 2016-10-29 NOTE — Progress Notes (Signed)
Dana [name] is a 61 y.o. female and presents with   Chief Complaint   Patient presents with   ??? Pre-op Exam   .  Patient having cataract surgery-  Denies chest pain or sob      Current Outpatient Prescriptions   Medication Sig Dispense Refill   ??? metFORMIN ER (GLUCOPHAGE XR) 500 mg tablet TAKE 1 TABLET EVERY MORNING AND 2 TABLETS WITH DINNER (Patient taking differently: 2 tabs twice day) 270 Tab 2   ??? losartan (COZAAR) 50 mg tablet TAKE 1 TABLET DAILY 90 Tab 0   ??? simvastatin (ZOCOR) 40 mg tablet TAKE 1 TABLET NIGHTLY 90 Tab 1   ??? triamterene-hydroCHLOROthiazide (MAXZIDE) 37.5-25 mg per tablet TAKE 1 TABLET DAILY 90 Tab 2   ??? HUMULIN N KWIKPEN 100 unit/mL (3 mL) inpn INJECT 32 UNITS SUBCUTANEOUSLY AT BEDTIME 30 Adjustable Dose Pre-filled Pen Syringe 2   ??? sertraline (ZOLOFT) 100 mg tablet TAKE 1 TABLET BY MOUTH DAILY 30 Tab 3   ??? Insulin Needles, Disposable, 31 gauge x 5/16" ndle by IntraDERMal route four (4) times daily. Test blood sugars 4 times daily DX:  E11.65 100 Package 11   ??? lidocaine (LIDODERM) 5 %(700 mg/patch) 1 patch by TransDERmal route every twelve (12) hours. Apply patch to the affected area for 12 hours a day and remove for 12 hours a day. 30 Package 2   ??? Insulin Needles, Disposable, 31 X 5/16 " ndle by IntraMUSCular route daily. 1-2 times daily for insulin  DX 250.00  90 day supply 3 Package 11   ??? ONE TOUCH ULTRA TEST strip USE 1 TO 2 TIMES DAILY 2 Package 3     Allergies   Allergen Reactions   ??? Adhesive Itching   ??? Dolobid [Diflunisal] Itching     Itching,swelling     Past Medical History:   Diagnosis Date   ??? Diabetes (HCC) 01/24/2013   ??? GERD (gastroesophageal reflux disease)    ??? HTN (hypertension)    ??? Hypercholesterolemia      Past Surgical History:   Procedure Laterality Date   ??? HX HYSTERECTOMY       History reviewed. No pertinent family history.  Social History   Substance Use Topics   ??? Smoking status: Former Smoker     Packs/day: 1.00     Quit date: 06/27/2011    ??? Smokeless tobacco: Never Used   ??? Alcohol use Not on file      The patient does not have a history of falls. A plan of care for falls was documented..  Depression screen neg      Objective:  Visit Vitals   ??? BP 132/82   ??? Ht 5' 3.5" (1.613 m)     wdwn 61 yo wf  In NAD. A&O.  HEENT -- Pupils round.  O/P Clear.  Neck -- Supple. No JVD.  Heart -- RRR. No R/M/G.  Lungs -- CTA.  Abdomen -- Soft. Non-tender. Non-distended. No masses. Bowel sounds present.  Extremities -- No edema. Good pulses      Results for orders placed or performed in visit on 10/29/16   AMB POC URINALYSIS DIP STICK MANUAL W/O MICRO   Result Value Ref Range    Color (UA POC) Yellow     Clarity (UA POC) Clear     Glucose (UA POC) Negative Negative    Bilirubin (UA POC) Negative Negative    Ketones (UA POC) Negative Negative    Specific gravity (UA POC) 1.025  1.001 - 1.035    Blood (UA POC) Trace Negative    pH (UA POC) 5.5 4.6 - 8.0    Protein (UA POC) Negative Negative    Urobilinogen (UA POC) 0.2 mg/dL 0.2 - 1    Nitrites (UA POC) Negative Negative    Leukocyte esterase (UA POC) Negative Negative   AMB POC HEMOGLOBIN A1C   Result Value Ref Range    Hemoglobin A1c (POC) 7.1 (A) 4.8 - 5.6 %       Assessment/Plan:    ICD-10-CM ICD-9-CM    1. Hypercholesterolemia E78.00 272.0    2. Type 2 diabetes mellitus with hyperglycemia, with long-term current use of insulin (HCC) E11.65 250.00 AMB POC URINALYSIS DIP STICK MANUAL W/O MICRO    Z79.4 790.29 AMB POC HEMOGLOBIN A1C     V58.67    3. Hypertension complicating diabetes (HCC) E11.59 250.80     I10 401.9    4. Morbid obesity due to excess calories (HCC) E66.01 278.01      Follow up PE      Author:  Lake Bells, MD 10/29/2016 2:43 PM

## 2016-11-14 MED ORDER — SIMVASTATIN 40 MG TAB
40 mg | ORAL_TABLET | ORAL | 1 refills | Status: DC
Start: 2016-11-14 — End: 2017-05-13

## 2016-11-15 MED ORDER — LOSARTAN 50 MG TAB
50 mg | ORAL_TABLET | ORAL | 0 refills | Status: DC
Start: 2016-11-15 — End: 2017-02-13

## 2016-12-20 ENCOUNTER — Ambulatory Visit: Admit: 2016-12-20 | Payer: PRIVATE HEALTH INSURANCE | Attending: Internal Medicine | Primary: Internal Medicine

## 2016-12-20 DIAGNOSIS — E1165 Type 2 diabetes mellitus with hyperglycemia: Secondary | ICD-10-CM

## 2016-12-20 LAB — AMB POC HEMOGLOBIN A1C: Hemoglobin A1c (POC): 6.9 % — AB (ref 4.8–5.6)

## 2016-12-20 NOTE — Telephone Encounter (Signed)
Patient would like for you to call her over the fax you were waiting for this morning to confirm you got it.    Pt ph# [phone]

## 2016-12-20 NOTE — Progress Notes (Signed)
Dana [name] is a 61 y.o. female and presents with   Chief Complaint   Patient presents with   ??? Pre-op Exam   .  Patient here for a pre op cataract.  Feeling ok, no chest pain or sob.  Not checking blood sugars      Current Outpatient Prescriptions   Medication Sig Dispense Refill   ??? losartan (COZAAR) 50 mg tablet TAKE 1 TABLET DAILY 90 Tab 0   ??? simvastatin (ZOCOR) 40 mg tablet TAKE 1 TABLET NIGHTLY 90 Tab 1   ??? metFORMIN ER (GLUCOPHAGE XR) 500 mg tablet TAKE 1 TABLET EVERY MORNING AND 2 TABLETS WITH DINNER (Patient taking differently: 2 tabs twice day) 270 Tab 2   ??? triamterene-hydroCHLOROthiazide (MAXZIDE) 37.5-25 mg per tablet TAKE 1 TABLET DAILY 90 Tab 2   ??? HUMULIN N KWIKPEN 100 unit/mL (3 mL) inpn INJECT 32 UNITS SUBCUTANEOUSLY AT BEDTIME 30 Adjustable Dose Pre-filled Pen Syringe 2   ??? sertraline (ZOLOFT) 100 mg tablet TAKE 1 TABLET BY MOUTH DAILY 30 Tab 3   ??? Insulin Needles, Disposable, 31 gauge x 5/16" ndle by IntraDERMal route four (4) times daily. Test blood sugars 4 times daily DX:  E11.65 100 Package 11   ??? lidocaine (LIDODERM) 5 %(700 mg/patch) 1 patch by TransDERmal route every twelve (12) hours. Apply patch to the affected area for 12 hours a day and remove for 12 hours a day. 30 Package 2   ??? Insulin Needles, Disposable, 31 X 5/16 " ndle by IntraMUSCular route daily. 1-2 times daily for insulin  DX 250.00  90 day supply 3 Package 11   ??? ONE TOUCH ULTRA TEST strip USE 1 TO 2 TIMES DAILY 2 Package 3     Allergies   Allergen Reactions   ??? Adhesive Itching   ??? Dolobid [Diflunisal] Itching     Itching,swelling     Past Medical History:   Diagnosis Date   ??? Diabetes (HCC) 01/24/2013   ??? GERD (gastroesophageal reflux disease)    ??? HTN (hypertension)    ??? Hypercholesterolemia      Past Surgical History:   Procedure Laterality Date   ??? HX HYSTERECTOMY       No family history on file.  Social History   Substance Use Topics   ??? Smoking status: Former Smoker     Packs/day: 1.00     Quit date: 06/27/2011    ??? Smokeless tobacco: Never Used   ??? Alcohol use Not on file      The patient does not have a history of falls. A plan of care for falls was documented..  Depression screen positive, medication was prescribed, drug therapy education given.      Objective:  Visit Vitals   ??? BP 110/80   ??? Ht  (1.6 m)   ??? Wt 249 lb (112.9 kg)   ??? BMI 44.11 kg/m2     wdwn 61 yo wf  In NAD. A&O.  HEENT -- Pupils round.  O/P Clear.  Neck -- Supple. No JVD.  Heart -- RRR. No R/M/G.  Lungs -- CTA.  Abdomen -- Soft. Non-tender. Non-distended. No masses. Bowel sounds present.  Extremities -- No edema. Good pulses, no lesions      Results for orders placed or performed in visit on 12/20/16   AMB POC HEMOGLOBIN A1C   Result Value Ref Range    Hemoglobin A1c (POC) 6.9 (A) 4.8 - 5.6 %       Assessment/Plan:  ICD-10-CM ICD-9-CM    1. Type 2 diabetes mellitus with hyperglycemia, with long-term current use of insulin (HCC) E11.65 250.00 AMB POC HEMOGLOBIN A1C    Z79.4 790.29      V58.67    2. Morbid obesity due to excess calories (HCC) E66.01 278.01    3. Hypertension complicating diabetes (HCC) E11.59 250.80     I10 401.9    4. Encounter for hepatitis C screening test for low risk patient Z11.59 V73.89 HEPATITIS C AB     Fill out pre op form      Author:  Lake Bells, MD 12/20/2016 9:56 AM

## 2016-12-21 LAB — HEPATITIS C AB: Hep C Virus Ab: <0.1 s/co ratio (ref 0.0–0.9)

## 2016-12-21 LAB — HEPATITIS C ANTIBODY: HCV Ab: <0.1 s/co ratio (ref 0.0–0.9)

## 2016-12-21 NOTE — Telephone Encounter (Signed)
Called and spoke with her and she will fax over [facility] form wants dr to sign and clear patient to have surgery

## 2016-12-21 NOTE — Telephone Encounter (Signed)
Pat from [facility] was calling to get a paper faxed over from the Dr that the Pt is clear to get surgery today ASAP please    Ph# [phone]  Fax# [phone]

## 2016-12-24 NOTE — Progress Notes (Signed)
pls call= aic 6.9   . Hep c negative  Work on diet and exercise

## 2017-01-03 MED ORDER — TRIAMTERENE-HYDROCHLOROTHIAZIDE 37.5 MG-25 MG TAB
ORAL_TABLET | ORAL | 2 refills | Status: DC
Start: 2017-01-03 — End: 2017-10-02

## 2017-02-14 MED ORDER — LOSARTAN 50 MG TAB
50 mg | ORAL_TABLET | ORAL | 0 refills | Status: DC
Start: 2017-02-14 — End: 2017-05-11

## 2017-02-18 ENCOUNTER — Encounter: Attending: Internal Medicine | Primary: Internal Medicine

## 2017-03-15 ENCOUNTER — Encounter

## 2017-03-15 MED ORDER — INSULIN NPH HUMAN RECOMB 100 UNIT/ML (3 ML) SUB-Q PEN
100 unit/mL (3 mL) | SUBCUTANEOUS | 2 refills | Status: DC
Start: 2017-03-15 — End: 2017-07-17

## 2017-03-17 ENCOUNTER — Ambulatory Visit: Admit: 2017-03-17 | Payer: PRIVATE HEALTH INSURANCE | Attending: Internal Medicine | Primary: Internal Medicine

## 2017-03-17 DIAGNOSIS — Z23 Encounter for immunization: Secondary | ICD-10-CM

## 2017-03-17 LAB — AMB POC URINALYSIS DIP STICK MANUAL W/O MICRO
Bilirubin (UA POC): NEGATIVE
Blood (UA POC): NEGATIVE
Glucose (UA POC): NEGATIVE
Ketones (UA POC): NEGATIVE
Leukocyte esterase (UA POC): NEGATIVE
Nitrites (UA POC): NEGATIVE
Protein (UA POC): NEGATIVE
Specific gravity (UA POC): 1.02 (ref 1.001–1.035)
Urobilinogen (UA POC): 0.2 (ref 0.2–1)
pH (UA POC): 6 (ref 4.6–8.0)

## 2017-03-17 LAB — AMB POC LIPID PROFILE
Cholesterol (POC): 125 mg/dL (ref 100–199)
HDL Cholesterol (POC): 36 mg/dL (ref 35–150)
LDL Cholesterol (POC): 67 mg/dL (ref 0–129)
Non-HDL Goal (POC): 89
TChol/HDL Ratio (POC): 3.5 CALC (ref 0.0–5.0)
Triglycerides (POC): 111 mg/dL (ref 0–150)

## 2017-03-17 LAB — AMB POC COMPLETE CBC,AUTOMATED ENTER
ABS. GRANS (POC): 5.9 10*3/uL (ref 1.4–6.5)
ABS. LYMPHS (POC): 2.9 10*3/uL (ref 1.2–3.4)
ABS. MONOS (POC): 0.2 10*3/uL (ref 0.1–0.6)
GRANULOCYTES (POC): 66.1 % (ref 42.2–75.2)
HCT (POC): 40.9 % (ref 35.0–60.0)
HGB (POC): 13.6 g/dL (ref 11–18)
LYMPHOCYTES (POC): 31.9 % (ref 20.5–51.1)
MCH (POC): 29.9 pg (ref 27.0–31.0)
MCHC (POC): 33.2 g/dL (ref 33.0–37.0)
MCV (POC): 90.2 fL (ref 80.0–99.9)
MONOCYTES (POC): 2 % (ref 1.7–9.3)
MPV (POC): 8.9 fL (ref 7.8–11)
PLATELET (POC): 230 10*3/uL (ref 150–450)
RBC (POC): 4.53 M/uL (ref 4.00–6.00)
RDW (POC): 14.8 % — AB (ref 11.6–13.7)
WBC (POC): 9 10*3/uL (ref 4.5–10.5)

## 2017-03-17 LAB — AMB POC HEMOGLOBIN A1C: Hemoglobin A1c (POC): 7.1 % — AB (ref 4.8–5.6)

## 2017-03-17 NOTE — Progress Notes (Signed)
History and Physical    Dana [name] is a 61 y.o. female presents for physical exam.  Having some back pain after drive [place] from [place]    Past Medical History:   Diagnosis Date   ??? Diabetes (HCC) 01/24/2013   ??? GERD (gastroesophageal reflux disease)    ??? HTN (hypertension)    ??? Hypercholesterolemia      Past Surgical History:   Procedure Laterality Date   ??? HX HYSTERECTOMY       Current Outpatient Prescriptions   Medication Sig Dispense Refill   ??? insulin NPH (HUMULIN N NPH INSULIN KWIKPEN) 100 unit/mL (3 mL) inpn INJECT 32 UNITS SUBCUTANEOUSLY AT BEDTIME 30 Adjustable Dose Pre-filled Pen Syringe 2   ??? losartan (COZAAR) 50 mg tablet TAKE 1 TABLET DAILY 90 Tab 0   ??? triamterene-hydroCHLOROthiazide (MAXZIDE) 37.5-25 mg per tablet TAKE 1 TABLET DAILY 90 Tab 2   ??? simvastatin (ZOCOR) 40 mg tablet TAKE 1 TABLET NIGHTLY 90 Tab 1   ??? metFORMIN ER (GLUCOPHAGE XR) 500 mg tablet TAKE 1 TABLET EVERY MORNING AND 2 TABLETS WITH DINNER (Patient taking differently: 2 tabs twice day) 270 Tab 2   ??? sertraline (ZOLOFT) 100 mg tablet TAKE 1 TABLET BY MOUTH DAILY 30 Tab 3   ??? Insulin Needles, Disposable, 31 gauge x 5/16" ndle by IntraDERMal route four (4) times daily. Test blood sugars 4 times daily DX:  E11.65 100 Package 11   ??? lidocaine (LIDODERM) 5 %(700 mg/patch) 1 patch by TransDERmal route every twelve (12) hours. Apply patch to the affected area for 12 hours a day and remove for 12 hours a day. 30 Package 2   ??? Insulin Needles, Disposable, 31 X 5/16 " ndle by IntraMUSCular route daily. 1-2 times daily for insulin  DX 250.00  90 day supply 3 Package 11   ??? ONE TOUCH ULTRA TEST strip USE 1 TO 2 TIMES DAILY 2 Package 3     Allergies   Allergen Reactions   ??? Adhesive Itching   ??? Dolobid [Diflunisal] Itching     Itching,swelling     Social History     Social History   ??? Marital status: SINGLE     Spouse name: N/A   ??? Number of children: N/A   ??? Years of education: N/A     Social History Main Topics    ??? Smoking status: Former Smoker     Packs/day: 1.00     Quit date: 06/27/2011   ??? Smokeless tobacco: Never Used   ??? Alcohol use Not on file   ??? Drug use: Not on file   ??? Sexual activity: Not on file     Other Topics Concern   ??? Not on file     Social History Narrative     No family history on file.  The patient does not have a history of falls. A plan of care for falls was documented..  Depression screen pos- treated    Review of Systems:  Denies dysphagia, chest pain, or SOB.  No nausea or vomiting, or diarrhea or constipation.  No fever, chills, night sweats, or cough.  No dysuria, frequency or abdominal pain.  No headaches.  Mammogram 04/2015- neg  Bone density  Yrs ago colonoscopy - due  Yearly eye exam:   Yes, has had cataract surgery.  Yearly dental exam: yes    Objective  Visit Vitals   ??? BP 130/70   ??? Ht 5' 3.5" (1.613 m)   ??? Wt  250 lb (113.4 kg)   ??? BMI 43.59 kg/m2     General appearance - well developed, well nourished 61 y.o. wf  Eyes - PERRL  Tm- intact  Pharynx- no lesions  Neck --Supple, no anterior cervical nodes, no thyromegaly. ? Bruit on right  Lungs --CTA  Heart - Regular rate and rhythm  Abdomen - soft, no tenderness or distention  Breasts - no masses no axillary nodes  Extremities - no clubbing, cyanosis or edema, good pulses, no lesions    Results for orders placed or performed in visit on 03/17/17   AMB POC URINALYSIS DIP STICK MANUAL W/O MICRO   Result Value Ref Range    Color (UA POC) Yellow     Clarity (UA POC) Clear     Glucose (UA POC) Negative Negative    Bilirubin (UA POC) Negative Negative    Ketones (UA POC) Negative Negative    Specific gravity (UA POC) 1.020 1.001 - 1.035    Blood (UA POC) Negative Negative    pH (UA POC) 6.0 4.6 - 8.0    Protein (UA POC) Negative Negative    Urobilinogen (UA POC) 0.2 mg/dL 0.2 - 1    Nitrites (UA POC) Negative Negative    Leukocyte esterase (UA POC) Negative Negative   AMB POC HEMOGLOBIN A1C   Result Value Ref Range     Hemoglobin A1c (POC) 7.1 (A) 4.8 - 5.6 %   AMB POC LIPID PROFILE   Result Value Ref Range    Cholesterol (POC) 125 100 - 199 mg/dL    Triglycerides (POC) 111 0 - 150 mg/dL    HDL Cholesterol (POC) 36 35 - 150 mg/dL    LDL Cholesterol (POC) 67 0 - 129 mg/dL    Non-HDL Goal (POC) 89     TChol/HDL Ratio (POC) 3.5 0.0 - 5.0 CALC   AMB POC COMPLETE CBC,AUTOMATED ENTER   Result Value Ref Range    WBC (POC) 9.0 4.5 - 10.5 K/uL    LYMPHOCYTES (POC) 31.9 20.5 - 51.1 %    MONOCYTES (POC) 2.0 1.7 - 9.3 %    GRANULOCYTES (POC) 66.1 42.2 - 75.2 %    ABS. LYMPHS (POC) 2.9 1.2 - 3.4 K/uL    ABS. MONOS (POC) 0.2 0.1 - 0.6 10^3/ul    ABS. GRANS (POC) 5.9 1.4 - 6.5 10^3/ul    RBC (POC) 4.53 4.00 - 6.00 M/uL    HGB (POC) 13.6 11 - 18 g/dL    HCT (POC) 16.1 09.6 - 60.0 %    MCV (POC) 90.2 80.0 - 99.9 fL    MCH (POC) 29.9 27.0 - 31.0 pg    MCHC (POC) 33.2 33.0 - 37.0 g/dL    RDW (POC) 04.5 (A) 40.9 - 13.7 %    PLATELET (POC) 230 150 - 450 K/uL    MPV (POC) 8.9 7.8 - 11 fL         Assessment/Plan      ICD-10-CM ICD-9-CM    1. Need for pneumococcal vaccination Z23 V03.82 PNEUMOCOCCAL POLYSACCHARIDE VACCINE, 23-VALENT, ADULT OR IMMUNOSUPPRESSED PT DOSE,   2. Routine general medical examination at a health care facility Z00.00 V70.0 AMB POC URINALYSIS DIP STICK MANUAL W/O MICRO      AMB POC HEMOGLOBIN A1C      AMB POC LIPID PROFILE      COLLECTION VENOUS BLOOD,VENIPUNCTURE      TSH 3RD GENERATION      METABOLIC PANEL, COMPREHENSIVE      AMB  POC COMPLETE CBC,AUTOMATED ENTER      VITAMIN D, 25 HYDROXY   3. Chronic left-sided low back pain with left-sided sciatica M54.42 724.2 REFERRAL TO PHYSICAL THERAPY    G89.29 724.3      338.29      Decrease carbs, increase exercise  Mammogram, bone density  colonoscopy    AUTHOR:  Lake Bells, MD 03/17/2017 2:05 PM

## 2017-03-17 NOTE — Patient Instructions (Signed)
Results for orders placed or performed in visit on 03/17/17   AMB POC URINALYSIS DIP STICK MANUAL W/O MICRO   Result Value Ref Range    Color (UA POC) Yellow     Clarity (UA POC) Clear     Glucose (UA POC) Negative Negative    Bilirubin (UA POC) Negative Negative    Ketones (UA POC) Negative Negative    Specific gravity (UA POC) 1.020 1.001 - 1.035    Blood (UA POC) Negative Negative    pH (UA POC) 6.0 4.6 - 8.0    Protein (UA POC) Negative Negative    Urobilinogen (UA POC) 0.2 mg/dL 0.2 - 1    Nitrites (UA POC) Negative Negative    Leukocyte esterase (UA POC) Negative Negative   AMB POC HEMOGLOBIN A1C   Result Value Ref Range    Hemoglobin A1c (POC) 7.1 (A) 4.8 - 5.6 %   AMB POC LIPID PROFILE   Result Value Ref Range    Cholesterol (POC) 125 100 - 199 mg/dL    Triglycerides (POC) 111 0 - 150 mg/dL    HDL Cholesterol (POC) 36 35 - 150 mg/dL    LDL Cholesterol (POC) 67 0 - 129 mg/dL    Non-HDL Goal (POC) 89     TChol/HDL Ratio (POC) 3.5 0.0 - 5.0 CALC   AMB POC COMPLETE CBC,AUTOMATED ENTER   Result Value Ref Range    WBC (POC) 9.0 4.5 - 10.5 K/uL    LYMPHOCYTES (POC) 31.9 20.5 - 51.1 %    MONOCYTES (POC) 2.0 1.7 - 9.3 %    GRANULOCYTES (POC) 66.1 42.2 - 75.2 %    ABS. LYMPHS (POC) 2.9 1.2 - 3.4 K/uL    ABS. MONOS (POC) 0.2 0.1 - 0.6 10^3/ul    ABS. GRANS (POC) 5.9 1.4 - 6.5 10^3/ul    RBC (POC) 4.53 4.00 - 6.00 M/uL    HGB (POC) 13.6 11 - 18 g/dL    HCT (POC) 98.140.9 19.135.0 - 60.0 %    MCV (POC) 90.2 80.0 - 99.9 fL    MCH (POC) 29.9 27.0 - 31.0 pg    MCHC (POC) 33.2 33.0 - 37.0 g/dL    RDW (POC) 47.814.8 (A) 29.511.6 - 13.7 %    PLATELET (POC) 230 150 - 450 K/uL    MPV (POC) 8.9 7.8 - 11 fL

## 2017-03-18 LAB — METABOLIC PANEL, COMPREHENSIVE
A-G Ratio: 1.6 (ref 1.2–2.2)
ALT (SGPT): 16 IU/L (ref 0–32)
AST (SGOT): 15 IU/L (ref 0–40)
Albumin: 4.1 g/dL (ref 3.5–5.5)
Alk. phosphatase: 56 IU/L (ref 39–117)
BUN/Creatinine ratio: 24 — ABNORMAL HIGH (ref 9–23)
BUN: 16 mg/dL (ref 6–24)
Bilirubin, total: 0.4 mg/dL (ref 0.0–1.2)
CO2: 21 mmol/L (ref 20–29)
Calcium: 9.2 mg/dL (ref 8.7–10.2)
Chloride: 103 mmol/L (ref 96–106)
Creatinine: 0.67 mg/dL (ref 0.57–1.00)
GFR est AA: 111 mL/min/{1.73_m2} (ref >59)
GFR est non-AA: 97 mL/min/{1.73_m2} (ref >59)
GLOBULIN, TOTAL: 2.5 g/dL (ref 1.5–4.5)
Glucose: 83 mg/dL (ref 65–99)
Potassium: 4.1 mmol/L (ref 3.5–5.2)
Protein, total: 6.6 g/dL (ref 6.0–8.5)
Sodium: 142 mmol/L (ref 134–144)

## 2017-03-18 LAB — TSH 3RD GENERATION: TSH: 1.98 u[IU]/mL (ref 0.450–4.500)

## 2017-03-18 LAB — VITAMIN D, 25 HYDROXY: VITAMIN D, 25-HYDROXY: 49.2 ng/mL (ref 30.0–100.0)

## 2017-04-20 MED ORDER — SERTRALINE 100 MG TAB
100 mg | ORAL_TABLET | ORAL | 3 refills | Status: DC
Start: 2017-04-20 — End: 2018-01-16

## 2017-05-11 ENCOUNTER — Inpatient Hospital Stay
Admit: 2017-05-11 | Discharge: 2017-05-18 | Disposition: A | Discharge status: Home / Self Care | Payer: BLUE CROSS/BLUE SHIELD | Attending: Internal Medicine | Admitting: Internal Medicine

## 2017-05-11 DIAGNOSIS — K252 Acute gastric ulcer with both hemorrhage and perforation: Principal | ICD-10-CM

## 2017-05-11 LAB — METABOLIC PANEL, COMPREHENSIVE
A-G Ratio: 0.8 — ABNORMAL LOW (ref 1.1–2.2)
ALT (SGPT): 19 U/L (ref 12–78)
AST (SGOT): 19 U/L (ref 15–37)
Albumin: 3.1 g/dL — ABNORMAL LOW (ref 3.5–5.0)
Alk. phosphatase: 59 U/L (ref 45–117)
Anion gap: 12 mmol/L (ref 5–15)
BUN/Creatinine ratio: 19 (ref 12–20)
BUN: 17 MG/DL (ref 6–20)
Bilirubin, total: 0.6 MG/DL (ref 0.2–1.0)
CO2: 22 mmol/L (ref 21–32)
Calcium: 8.4 MG/DL — ABNORMAL LOW (ref 8.5–10.1)
Chloride: 101 mmol/L (ref 97–108)
Creatinine: 0.91 MG/DL (ref 0.55–1.02)
GFR est AA: >60 mL/min/{1.73_m2} (ref >60)
GFR est non-AA: >60 mL/min/{1.73_m2} (ref >60)
Globulin: 4 g/dL (ref 2.0–4.0)
Glucose: 178 mg/dL — ABNORMAL HIGH (ref 65–100)
Potassium: 4 mmol/L (ref 3.5–5.1)
Protein, total: 7.1 g/dL (ref 6.4–8.2)
Sodium: 135 mmol/L — ABNORMAL LOW (ref 136–145)

## 2017-05-11 LAB — CBC WITH AUTOMATED DIFF
ABS. BASOPHILS: 0 10*3/uL (ref 0.0–0.1)
ABS. EOSINOPHILS: 0 10*3/uL (ref 0.0–0.4)
ABS. IMM. GRANS.: 0.1 10*3/uL — ABNORMAL HIGH (ref 0.00–0.04)
ABS. LYMPHOCYTES: 1 10*3/uL (ref 0.8–3.5)
ABS. MONOCYTES: 0.4 10*3/uL (ref 0.0–1.0)
ABS. NEUTROPHILS: 10.7 10*3/uL — ABNORMAL HIGH (ref 1.8–8.0)
ABSOLUTE NRBC: 0 10*3/uL (ref 0.00–0.01)
BASOPHILS: 0 % (ref 0–1)
EOSINOPHILS: 0 % (ref 0–7)
HCT: 46.2 % (ref 35.0–47.0)
HGB: 15.4 g/dL (ref 11.5–16.0)
IMMATURE GRANULOCYTES: 1 % — ABNORMAL HIGH (ref 0.0–0.5)
LYMPHOCYTES: 8 % — ABNORMAL LOW (ref 12–49)
MCH: 30.4 PG (ref 26.0–34.0)
MCHC: 33.3 g/dL (ref 30.0–36.5)
MCV: 91.1 FL (ref 80.0–99.0)
MONOCYTES: 4 % — ABNORMAL LOW (ref 5–13)
MPV: 10.5 FL (ref 8.9–12.9)
NEUTROPHILS: 87 % — ABNORMAL HIGH (ref 32–75)
NRBC: 0 PER 100 WBC
PLATELET: 242 10*3/uL (ref 150–400)
RBC: 5.07 M/uL (ref 3.80–5.20)
RDW: 14.6 % — ABNORMAL HIGH (ref 11.5–14.5)
WBC: 12.3 10*3/uL — ABNORMAL HIGH (ref 3.6–11.0)

## 2017-05-11 LAB — PTT: aPTT: 26 s (ref 22.1–32.0)

## 2017-05-11 LAB — PROTHROMBIN TIME + INR
INR: 1 (ref 0.9–1.1)
Prothrombin time: 10.3 s (ref 9.0–11.1)

## 2017-05-11 LAB — GLUCOSE, POC: Glucose (POC): 337 mg/dL — ABNORMAL HIGH (ref 65–100)

## 2017-05-11 MED ORDER — LOSARTAN 50 MG TAB
50 mg | Freq: Every evening | ORAL | Status: DC
Start: 2017-05-11 — End: 2017-05-12
  Administered 2017-05-12: 02:00:00 via ORAL

## 2017-05-11 MED ORDER — GLUCOSE 4 GRAM CHEWABLE TAB
4 gram | ORAL | Status: DC | PRN
Start: 2017-05-11 — End: 2017-05-18

## 2017-05-11 MED ORDER — ONDANSETRON (PF) 4 MG/2 ML INJECTION
4 mg/2 mL | Freq: Four times a day (QID) | INTRAMUSCULAR | Status: DC | PRN
Start: 2017-05-11 — End: 2017-05-12
  Administered 2017-05-12 (×3): via INTRAVENOUS

## 2017-05-11 MED ORDER — DIPHENHYDRAMINE HCL 50 MG/ML IJ SOLN
50 mg/mL | INTRAMUSCULAR | Status: AC
Start: 2017-05-11 — End: 2017-05-11
  Administered 2017-05-11: 13:00:00 via INTRAVENOUS

## 2017-05-11 MED ORDER — METHYLPREDNISOLONE (PF) 125 MG/2 ML IJ SOLR
125 mg/2 mL | INTRAMUSCULAR | Status: AC
Start: 2017-05-11 — End: 2017-05-11
  Administered 2017-05-11: 12:00:00 via INTRAVENOUS

## 2017-05-11 MED ORDER — SERTRALINE 50 MG TAB
50 mg | Freq: Every day | ORAL | Status: DC
Start: 2017-05-11 — End: 2017-05-13
  Administered 2017-05-12: 14:00:00 via ORAL

## 2017-05-11 MED ORDER — ACETAMINOPHEN 325 MG TABLET
325 mg | ORAL | Status: DC | PRN
Start: 2017-05-11 — End: 2017-05-13
  Administered 2017-05-11 – 2017-05-12 (×3): via ORAL

## 2017-05-11 MED ORDER — GLUCAGON 1 MG INJECTION
1 mg | INTRAMUSCULAR | Status: DC | PRN
Start: 2017-05-11 — End: 2017-05-18

## 2017-05-11 MED ORDER — SODIUM CHLORIDE 0.9 % IJ SYRG
Freq: Three times a day (TID) | INTRAMUSCULAR | Status: DC
Start: 2017-05-11 — End: 2017-05-18
  Administered 2017-05-11 – 2017-05-18 (×20): via INTRAVENOUS

## 2017-05-11 MED ORDER — METFORMIN SR 500 MG 24 HR TABLET
500 mg | Freq: Two times a day (BID) | ORAL | Status: DC
Start: 2017-05-11 — End: 2017-05-12
  Administered 2017-05-11 – 2017-05-12 (×2): via ORAL

## 2017-05-11 MED ORDER — TRIAMTERENE-HYDROCHLOROTHIAZIDE 37.5 MG-25 MG TAB
Freq: Every day | ORAL | Status: DC
Start: 2017-05-11 — End: 2017-05-12
  Administered 2017-05-12: 16:00:00 via ORAL

## 2017-05-11 MED ORDER — INSULIN NPH HUMAN RECOMB 100 UNIT/ML INJECTION
100 unit/mL | Freq: Every evening | SUBCUTANEOUS | Status: DC
Start: 2017-05-11 — End: 2017-05-12
  Administered 2017-05-12: 02:00:00 via SUBCUTANEOUS

## 2017-05-11 MED ORDER — DIPHENHYDRAMINE HCL 50 MG/ML IJ SOLN
50 mg/mL | INTRAMUSCULAR | Status: AC
Start: 2017-05-11 — End: 2017-05-11
  Administered 2017-05-11: 12:00:00 via INTRAVENOUS

## 2017-05-11 MED ORDER — METHYLPREDNISOLONE (PF) 40 MG/ML IJ SOLR
40 mg/mL | Freq: Four times a day (QID) | INTRAMUSCULAR | Status: DC
Start: 2017-05-11 — End: 2017-05-12
  Administered 2017-05-11 – 2017-05-12 (×3): via INTRAVENOUS

## 2017-05-11 MED ORDER — INSULIN LISPRO 100 UNIT/ML INJECTION
100 unit/mL | Freq: Three times a day (TID) | SUBCUTANEOUS | Status: DC
Start: 2017-05-11 — End: 2017-05-13
  Administered 2017-05-11 – 2017-05-12 (×4): via SUBCUTANEOUS

## 2017-05-11 MED ORDER — SODIUM CHLORIDE 0.9 % IJ SYRG
INTRAMUSCULAR | Status: DC | PRN
Start: 2017-05-11 — End: 2017-05-18
  Administered 2017-05-13 – 2017-05-15 (×2): via INTRAVENOUS

## 2017-05-11 MED ORDER — ONDANSETRON (PF) 4 MG/2 ML INJECTION
4 mg/2 mL | INTRAMUSCULAR | Status: AC
Start: 2017-05-11 — End: 2017-05-11
  Administered 2017-05-11: 12:00:00 via INTRAVENOUS

## 2017-05-11 MED ORDER — SIMVASTATIN 20 MG TAB
20 mg | Freq: Every evening | ORAL | Status: DC
Start: 2017-05-11 — End: 2017-05-12
  Administered 2017-05-12: 02:00:00 via ORAL

## 2017-05-11 MED ORDER — DEXTROSE 50% IN WATER (D50W) IV SYRG
INTRAVENOUS | Status: DC | PRN
Start: 2017-05-11 — End: 2017-05-18

## 2017-05-11 MED ORDER — HYDROXYZINE 25 MG TAB
25 mg | Freq: Three times a day (TID) | ORAL | Status: DC | PRN
Start: 2017-05-11 — End: 2017-05-12
  Administered 2017-05-11 – 2017-05-12 (×2): via ORAL

## 2017-05-11 MED FILL — ACETAMINOPHEN 325 MG TABLET: 325 mg | ORAL | Qty: 2

## 2017-05-11 MED FILL — SOLU-MEDROL (PF) 125 MG/2 ML SOLUTION FOR INJECTION: 125 mg/2 mL | INTRAMUSCULAR | Qty: 2

## 2017-05-11 MED FILL — HYDROXYZINE 25 MG TAB: 25 mg | ORAL | Qty: 2

## 2017-05-11 MED FILL — NORMAL SALINE FLUSH 0.9 % INJECTION SYRINGE: INTRAMUSCULAR | Qty: 10

## 2017-05-11 MED FILL — DIPHENHYDRAMINE HCL 50 MG/ML IJ SOLN: 50 mg/mL | INTRAMUSCULAR | Qty: 1

## 2017-05-11 MED FILL — SOLU-MEDROL (PF) 40 MG/ML SOLUTION FOR INJECTION: 40 mg/mL | INTRAMUSCULAR | Qty: 1

## 2017-05-11 MED FILL — INSULIN LISPRO 100 UNIT/ML INJECTION: 100 unit/mL | SUBCUTANEOUS | Qty: 1

## 2017-05-11 MED FILL — ONDANSETRON (PF) 4 MG/2 ML INJECTION: 4 mg/2 mL | INTRAMUSCULAR | Qty: 4

## 2017-05-11 MED FILL — METFORMIN SR 500 MG 24 HR TABLET: 500 mg | ORAL | Qty: 2

## 2017-05-11 NOTE — ED Provider Notes (Addendum)
HPI       [age]y F here with itchy rash and hand swelling. Noticed swelling and itching of the hands yesterday. They have gotten so swollen that she is starting to have blisters on the skin. She has been rubbing them lots - they are so itchy but she doesn't want to scratch the skin. In areas where she's been rubbing lots, she has noticed some blood in the blisters. Today with hives extending up the arms and on the back. Some nausea. No vomiting. Having an "odd sensation" in her chest that feels like an air bubble. No new exposures. Nothing makes sx's better or worse. Took benadryl last night but not sure if it helped much or not.    Past Medical History:   Diagnosis Date   ??? Diabetes (HCC) 01/24/2013   ??? GERD (gastroesophageal reflux disease)    ??? HTN (hypertension)    ??? Hypercholesterolemia        Past Surgical History:   Procedure Laterality Date   ??? HX HYSTERECTOMY           No family history on file.    Social History     Social History   ??? Marital status: SINGLE     Spouse name: N/A   ??? Number of children: N/A   ??? Years of education: N/A     Occupational History   ??? Not on file.     Social History Main Topics   ??? Smoking status: Former Smoker     Packs/day: 1.00     Quit date: 06/27/2011   ??? Smokeless tobacco: Never Used   ??? Alcohol use Not on file   ??? Drug use: Not on file   ??? Sexual activity: Not on file     Other Topics Concern   ??? Not on file     Social History Narrative         ALLERGIES: Adhesive and Dolobid [diflunisal]    Review of Systems  Review of Systems   Constitutional: (-) weight loss.   HEENT: (-) stiff neck   Eyes: (-) discharge.   Respiratory: (-) cough.    Cardiovascular: (-) syncope.   Gastrointestinal: (-) blood in stool.   Genitourinary: (-) hematuria.  Musculoskeletal: (-) myalgias.   Neurological: (-) seizure.   Skin: (-) petechiae  Lymph/Immunologic: (-) enlarged lymph nodes  All other systems reviewed and are negative.      Vitals:    05/11/17 0729   BP: 148/68   Pulse: (!) 131    Resp: 22   Temp: 100.1 ??F (37.8 ??C)   SpO2: 93%   Weight: 112.5 kg (248 lb 0.3 oz)   Height: 5\' 7"  (1.702 m)            Physical Exam Nursing note and vitals reviewed.  Constitutional: oriented to person, place, and time. appears well-developed and well-nourished. No distress.  Head: Normocephalic and atraumatic. Sclera anicteric  Nose: No rhinorrhea  Mouth/Throat: Oropharynx is clear and moist. Pharynx normal  Eyes: Conjunctivae are normal. Pupils are equal, round, and reactive to light. Right eye exhibits no discharge. Left eye exhibits no discharge.  Neck: Painless normal range of motion. Neck supple. No LAD.  Cardiovascular: Normal rate, regular rhythm, normal heart sounds and intact distal pulses.  Exam reveals no gallop and no friction rub.  No murmur heard.  Pulmonary/Chest:  No respiratory distress. No wheezes. No rales. No rhonchi. No increased work of breathing. No accessory muscle use. Good air exchange  throughout.  Abdominal: soft, non-tender, no rebound or guarding. No hepatosplenomegaly. Normal bowel sounds throughout.  Back: no tenderness to palpation, no deformities, no CVA tenderness  Extremities/Musculoskeletal: Normal range of motion. no tenderness. (+) edema to bilat hands with a few scattered bullae, some with bloody fluid underneath. Distal extremities are neurovasc intact.  Lymphadenopathy:   No adenopathy.   Neurological:  Alert and oriented to person, place, and time. Coordination normal. CN 2-12 intact. Motor and sensory function intact.  Skin: Skin is warm and dry. Hives on the arms and back. No pallor.       MDM 69y F here with hives and hand swelling. Will start with iv benadryl and solumedrol then reeval. Unclear etiology.      ED Course       Procedures    9:43 AM  Has gotten a second dose of benadryl for itching and rash. Still with itching and still having new areas of urticaria. No trouble breathing.

## 2017-05-11 NOTE — Progress Notes (Signed)
Bedside and Verbal shift change report given to Stacie (Cabin crewoncoming nurse) by Marianna FussShaina (offgoing nurse). Report included the following information SBAR, Kardex, MAR and Recent Results.

## 2017-05-11 NOTE — ED Notes (Signed)
Bedside and Verbal shift change report given to Amil AmenJulia, Charity fundraiserN (oncoming nurse) by Jerene DillingIngrid, RN (offgoing nurse). Report included the following information SBAR, ED Summary, MAR and Recent Results.

## 2017-05-11 NOTE — ED Triage Notes (Signed)
TRIAGE: Patient arrives from home, escorted by female companion, for allergic reaction that began last night. Patient arrives with swollen hands, what appears to be blisters filled with blood to the left and the right hands, and raised, red areas that itch to bilateral arms, trunk, legs, and buttocks. She denies any trouble swallowing but states, "I do have an air bubble when I swallow that's in the middle of my throat."   She reports cough symptoms over the last few days.

## 2017-05-11 NOTE — ED Notes (Signed)
Pt reports still having intense itching and pain with rash. States that it looks better than it did prior to medication admin. MD notified.

## 2017-05-11 NOTE — ED Notes (Signed)
Bedside report received from Ingrid, RN.

## 2017-05-11 NOTE — ED Notes (Signed)
TRANSFER - OUT REPORT:    Verbal report given to Popel, RN(name) on Neva SeatRebecca D Jarnagin  being transferred to 604(unit) for routine progression of care       Report consisted of patient???s Situation, Background, Assessment and   Recommendations(SBAR).     Information from the following report(s) SBAR, ED Summary, Procedure Summary, MAR and Recent Results was reviewed with the receiving nurse.    Lines:   Peripheral IV 05/11/17 Right Antecubital (Active)   Site Assessment Clean, dry, & intact 05/11/2017  7:46 AM   Phlebitis Assessment 0 05/11/2017  7:46 AM   Infiltration Assessment 0 05/11/2017  7:46 AM   Dressing Status Clean, dry, & intact 05/11/2017  7:46 AM   Dressing Type Transparent 05/11/2017  7:46 AM   Hub Color/Line Status Pink 05/11/2017  7:46 AM   Action Taken Catheter retaped 05/11/2017  7:46 AM   Alcohol Cap Used Yes 05/11/2017  7:46 AM        Opportunity for questions and clarification was provided.      Patient transported with:   [employer]

## 2017-05-11 NOTE — Progress Notes (Signed)
Admission Medication Reconciliation:    Information obtained from: Patient    Significant PMH/Disease States:   Past Medical History:   Diagnosis Date   ??? Diabetes (HCC) 01/24/2013   ??? GERD (gastroesophageal reflux disease)    ??? HTN (hypertension)    ??? Hypercholesterolemia        Chief Complaint for this Admission:    Chief Complaint   Patient presents with   ??? Allergic Reaction         Allergies:  Adhesive and Dolobid [diflunisal]    Prior to Admission Medications:   Prior to Admission Medications   Prescriptions Last Dose Informant Patient Reported? Taking?   insulin NPH (HUMULIN N NPH INSULIN KWIKPEN) 100 unit/mL (3 mL) inpn 05/10/2017 at Unknown time  No Yes   Sig: INJECT 32 UNITS SUBCUTANEOUSLY AT BEDTIME   lidocaine (LIDODERM) 5 % Not Taking at Unknown time  Yes No   Sig: 1 Patch by TransDERmal route every twelve (12) hours as needed. Apply patch to the affected area for 12 hours a day and remove for 12 hours a day.   losartan (COZAAR) 50 mg tablet 05/10/2017 at Unknown time  Yes Yes   Sig: Take 50 mg by mouth nightly.   metFORMIN ER (GLUCOPHAGE XR) 500 mg tablet 05/11/2017 at Unknown time  Yes Yes   Sig: Take 1,000 mg by mouth two (2) times daily (with meals).   sertraline (ZOLOFT) 100 mg tablet 05/11/2017 at Unknown time  No Yes   Sig: TAKE 1 TABLET BY MOUTH DAILY   simvastatin (ZOCOR) 40 mg tablet 05/10/2017 at Unknown time  No Yes   Sig: TAKE 1 TABLET NIGHTLY   triamterene-hydroCHLOROthiazide (MAXZIDE) 37.5-25 mg per tablet 05/11/2017 at Unknown time  No Yes   Sig: TAKE 1 TABLET DAILY      Facility-Administered Medications: None         Comments/Recommendations: Medication reconciliation interview completed with patient.  The following updates have been made to the PTA medication list:    Removed:  non-medication entries  Updated:  losartan is being taken each evening.  Metformin is now 1000mg  twice daily.  Lidoderm patches are used as needed (and have not been used  in months).  Patient also taking over the counter vitamin D but unclear about the strength.    Thank you for allowing me to participate in the care of this patient. If there are any further questions, please contact the pharmacy at [phone] or the medication reconciliation pharmacist at [phone].    Gala Romneyavid DeLong, Pharm.D., BCPS

## 2017-05-11 NOTE — H&P (Signed)
History and Physical    Subjective:     Dana [name] is a 61 y.o. Caucasian female who presents with hives and swelling.  Patient spent the weekend at the [place].  Was not feeling great had some nausea, but did not eat any shellfish.  Ate flounder Sunday night.  Started itching yesterday and noticed hands were swelling,  During the night itching progressed and she developed hives- not helped by benadryl.    She has had no new meds, supplements or household products. Has not been taking any advil.  .   Past Medical History:   Diagnosis Date   ??? Diabetes ([place]) 01/24/2013   ??? GERD (gastroesophageal reflux disease)    ??? HTN (hypertension)    ??? Hypercholesterolemia      Allergies   Allergen Reactions   ??? Adhesive Itching   ??? Dolobid [Diflunisal] Itching     Itching,swelling     Prior to Admission medications    Medication Sig Start Date End Date Taking? Authorizing Provider   losartan (COZAAR) 50 mg tablet Take 50 mg by mouth nightly.   Yes Historical Provider   metFORMIN ER (GLUCOPHAGE XR) 500 mg tablet Take 1,000 mg by mouth two (2) times daily (with meals).   Yes Historical Provider   sertraline (ZOLOFT) 100 mg tablet TAKE 1 TABLET BY MOUTH DAILY 04/20/17  Yes Genene Churn, MD   insulin NPH (HUMULIN N NPH INSULIN KWIKPEN) 100 unit/mL (3 mL) inpn INJECT 32 UNITS SUBCUTANEOUSLY AT BEDTIME 03/15/17  Yes Genene Churn, MD   triamterene-hydroCHLOROthiazide (MAXZIDE) 37.5-25 mg per tablet TAKE 1 TABLET DAILY 01/03/17  Yes Genene Churn, MD   simvastatin (ZOCOR) 40 mg tablet TAKE 1 TABLET NIGHTLY 11/14/16  Yes Genene Churn, MD   lidocaine (LIDODERM) 5 % 1 Patch by TransDERmal route every twelve (12) hours as needed. Apply patch to the affected area for 12 hours a day and remove for 12 hours a day.    Historical Provider     Social History   Substance Use Topics   ??? Smoking status: Former Smoker     Packs/day: 1.00     Quit date: 06/27/2011   ??? Smokeless tobacco: Never Used   ??? Alcohol use Not on file      No family history on file.            Review of Systems:  As above.    Objective:       Physical Exam: obese 61 yo wf  In distress from hives/itching.Marland Kitchen  HEENT -- Pupils round.  O/P Clear.  Neck -- Supple. No JVD.  Heart -- RRR. No R/M/G.  Lungs -- CTA.  Abdomen -- Soft. Non-tender. Non-distended. No masses. Bowel sounds present.  Extremities -- +2  Edema. Of hands with hemorrhagic bullae in web spaces.    Skin- hives on trunk , face, extremities , buttocks        Data Review:   Recent Results (from the past 24 hour(s))   CBC WITH AUTOMATED DIFF    Collection Time: 05/11/17  7:38 AM   Result Value Ref Range    WBC 12.3 (H) 3.6 - 11.0 K/uL    RBC 5.07 3.80 - 5.20 M/uL    HGB 15.4 11.5 - 16.0 g/dL    HCT 46.2 35.0 - 47.0 %    MCV 91.1 80.0 - 99.0 FL    MCH 30.4 26.0 - 34.0 PG    MCHC 33.3 30.0 - 36.5  g/dL    RDW 14.6 (H) 11.5 - 14.5 %    PLATELET 242 150 - 400 K/uL    MPV 10.5 8.9 - 12.9 FL    NRBC 0.0 0 PER 100 WBC    ABSOLUTE NRBC 0.00 0.00 - 0.01 K/uL    NEUTROPHILS 87 (H) 32 - 75 %    LYMPHOCYTES 8 (L) 12 - 49 %    MONOCYTES 4 (L) 5 - 13 %    EOSINOPHILS 0 0 - 7 %    BASOPHILS 0 0 - 1 %    IMMATURE GRANULOCYTES 1 (H) 0.0 - 0.5 %    ABS. NEUTROPHILS 10.7 (H) 1.8 - 8.0 K/UL    ABS. LYMPHOCYTES 1.0 0.8 - 3.5 K/UL    ABS. MONOCYTES 0.4 0.0 - 1.0 K/UL    ABS. EOSINOPHILS 0.0 0.0 - 0.4 K/UL    ABS. BASOPHILS 0.0 0.0 - 0.1 K/UL    ABS. IMM. GRANS. 0.1 (H) 0.00 - 0.04 K/UL    DF AUTOMATED     METABOLIC PANEL, COMPREHENSIVE    Collection Time: 05/11/17  7:38 AM   Result Value Ref Range    Sodium 135 (L) 136 - 145 mmol/L    Potassium 4.0 3.5 - 5.1 mmol/L    Chloride 101 97 - 108 mmol/L    CO2 22 21 - 32 mmol/L    Anion gap 12 5 - 15 mmol/L    Glucose 178 (H) 65 - 100 mg/dL    BUN 17 6 - 20 MG/DL    Creatinine 0.91 0.55 - 1.02 MG/DL    BUN/Creatinine ratio 19 12 - 20      GFR est AA >60 >60 ml/min/1.58m    GFR est non-AA >60 >60 ml/min/1.768m   Calcium 8.4 (L) 8.5 - 10.1 MG/DL    Bilirubin, total 0.6 0.2 - 1.0 MG/DL     ALT (SGPT) 19 12 - 78 U/L    AST (SGOT) 19 15 - 37 U/L    Alk. phosphatase 59 45 - 117 U/L    Protein, total 7.1 6.4 - 8.2 g/dL    Albumin 3.1 (L) 3.5 - 5.0 g/dL    Globulin 4.0 2.0 - 4.0 g/dL    A-G Ratio 0.8 (L) 1.1 - 2.2     PROTHROMBIN TIME + INR    Collection Time: 05/11/17  7:38 AM   Result Value Ref Range    INR 1.0 0.9 - 1.1      Prothrombin time 10.3 9.0 - 11.1 sec   PTT    Collection Time: 05/11/17  7:38 AM   Result Value Ref Range    aPTT 26.0 22.1 - 32.0 sec    aPTT, therapeutic range     58.0 - 77.0 SECS           Assessment:     Active Problems:    Hypercholesterolemia ()      HTN (hypertension) ()      Diabetes ([facility](01/24/2013)      Hypertension complicating diabetes ([facility](05/25/2016)      Morbid obesity due to excess calories ([facility](05/25/2016)      Hives (05/11/2017)        Plan:     Iv steroids for hives  nph insulin and sliding scale  Vistaril for itching  Monitor airway for edema    Signed By: NaGenene ChurnMD     May 11, 2017

## 2017-05-11 NOTE — ED Notes (Signed)
Bedside report given to Sherri, RN.

## 2017-05-11 NOTE — Progress Notes (Signed)
TRANSFER - IN REPORT:    Verbal report received from Sherrie RN(name) on Dana [name]  being received from Popel RN(unit) for routine progression of care      Report consisted of patient???s Situation, Background, Assessment and   Recommendations(SBAR).     Information from the following report(s) SBAR, Kardex and ED Summary was reviewed with the receiving nurse.    Opportunity for questions and clarification was provided.      Assessment completed upon patient???s arrival to unit and care assumed.     1700 Primary Nurse Dana [name] and CeCe, RN performed a dual skin assessment on this patient No impairment noted  Braden score is 23 blisters on bilateral hands, redness and hives on chest, back, trunks, under breasts and groin area.  Popel [name]

## 2017-05-12 ENCOUNTER — Observation Stay: Admit: 2017-05-12 | Payer: BLUE CROSS/BLUE SHIELD | Primary: Internal Medicine

## 2017-05-12 ENCOUNTER — Inpatient Hospital Stay

## 2017-05-12 LAB — METABOLIC PANEL, COMPREHENSIVE
A-G Ratio: 0.7 — ABNORMAL LOW (ref 1.1–2.2)
ALT (SGPT): 16 U/L (ref 12–78)
AST (SGOT): 19 U/L (ref 15–37)
Albumin: 2.6 g/dL — ABNORMAL LOW (ref 3.5–5.0)
Alk. phosphatase: 39 U/L — ABNORMAL LOW (ref 45–117)
Anion gap: 11 mmol/L (ref 5–15)
BUN/Creatinine ratio: 23 — ABNORMAL HIGH (ref 12–20)
BUN: 30 MG/DL — ABNORMAL HIGH (ref 6–20)
Bilirubin, total: 0.9 MG/DL (ref 0.2–1.0)
CO2: 26 mmol/L (ref 21–32)
Calcium: 7.5 MG/DL — ABNORMAL LOW (ref 8.5–10.1)
Chloride: 96 mmol/L — ABNORMAL LOW (ref 97–108)
Creatinine: 1.31 MG/DL — ABNORMAL HIGH (ref 0.55–1.02)
GFR est AA: 50 mL/min/{1.73_m2} — ABNORMAL LOW (ref >60)
GFR est non-AA: 42 mL/min/{1.73_m2} — ABNORMAL LOW (ref >60)
Globulin: 3.5 g/dL (ref 2.0–4.0)
Glucose: 270 mg/dL — ABNORMAL HIGH (ref 65–100)
Potassium: 4.1 mmol/L (ref 3.5–5.1)
Protein, total: 6.1 g/dL — ABNORMAL LOW (ref 6.4–8.2)
Sodium: 133 mmol/L — ABNORMAL LOW (ref 136–145)

## 2017-05-12 LAB — CBC W/O DIFF
ABSOLUTE NRBC: 0 10*3/uL (ref 0.00–0.01)
HCT: 51.9 % — ABNORMAL HIGH (ref 35.0–47.0)
HGB: 16.3 g/dL — ABNORMAL HIGH (ref 11.5–16.0)
MCH: 29.9 PG (ref 26.0–34.0)
MCHC: 31.4 g/dL (ref 30.0–36.5)
MCV: 95.1 FL (ref 80.0–99.0)
MPV: 9.9 FL (ref 8.9–12.9)
NRBC: 0 PER 100 WBC
PLATELET: 263 10*3/uL (ref 150–400)
RBC: 5.46 M/uL — ABNORMAL HIGH (ref 3.80–5.20)
RDW: 14.6 % — ABNORMAL HIGH (ref 11.5–14.5)
WBC: 9.1 10*3/uL (ref 3.6–11.0)

## 2017-05-12 LAB — GLUCOSE, POC
Glucose (POC): 252 mg/dL — ABNORMAL HIGH (ref 65–100)
Glucose (POC): 285 mg/dL — ABNORMAL HIGH (ref 65–100)
Glucose (POC): 254 mg/dL — ABNORMAL HIGH (ref 65–100)
Glucose (POC): 200 mg/dL — ABNORMAL HIGH (ref 65–100)

## 2017-05-12 LAB — LIPASE: Lipase: 150 U/L (ref 73–393)

## 2017-05-12 LAB — TROPONIN I: Troponin-I, Qt.: <0.05 ng/mL (ref <0.05)

## 2017-05-12 MED ORDER — SUCRALFATE 100 MG/ML ORAL SUSP
100 mg/mL | Freq: Four times a day (QID) | ORAL | Status: DC
Start: 2017-05-12 — End: 2017-05-13

## 2017-05-12 MED ORDER — SODIUM CHLORIDE 0.9 % INJECTION
202 mg/2 mL | Freq: Two times a day (BID) | INTRAMUSCULAR | Status: DC
Start: 2017-05-12 — End: 2017-05-16
  Administered 2017-05-13 – 2017-05-16 (×8): via INTRAVENOUS

## 2017-05-12 MED ORDER — HYDROXYZINE 25 MG TAB
25 mg | ORAL | Status: DC | PRN
Start: 2017-05-12 — End: 2017-05-13
  Administered 2017-05-12: 14:00:00 via ORAL

## 2017-05-12 MED ORDER — MORPHINE 2 MG/ML INJECTION
2 mg/mL | Freq: Once | INTRAMUSCULAR | Status: AC
Start: 2017-05-12 — End: 2017-05-12
  Administered 2017-05-12: 22:00:00 via INTRAVENOUS

## 2017-05-12 MED ORDER — NITROGLYCERIN 0.4 MG SUBLINGUAL TAB
0.4 mg | SUBLINGUAL | Status: AC | PRN
Start: 2017-05-12 — End: 2017-05-12
  Administered 2017-05-12: 21:00:00 via SUBLINGUAL

## 2017-05-12 MED ORDER — FENTANYL CITRATE (PF) 50 MCG/ML IJ SOLN
50 mcg/mL | INTRAMUSCULAR | Status: DC | PRN
Start: 2017-05-12 — End: 2017-05-13
  Administered 2017-05-12 – 2017-05-13 (×2): via INTRAVENOUS

## 2017-05-12 MED ORDER — MORPHINE 4 MG/ML SYRINGE
4 mg/mL | INTRAMUSCULAR | Status: DC | PRN
Start: 2017-05-12 — End: 2017-05-13
  Administered 2017-05-12 – 2017-05-13 (×5): via INTRAVENOUS

## 2017-05-12 MED ORDER — PROCHLORPERAZINE EDISYLATE 5 MG/ML INJECTION
5 mg/mL | Freq: Four times a day (QID) | INTRAMUSCULAR | Status: DC | PRN
Start: 2017-05-12 — End: 2017-05-18
  Administered 2017-05-12 – 2017-05-14 (×2): via INTRAVENOUS

## 2017-05-12 MED ORDER — SODIUM CHLORIDE 0.9% BOLUS IV
0.9 % | Freq: Once | INTRAVENOUS | Status: AC
Start: 2017-05-12 — End: 2017-05-12
  Administered 2017-05-13 – 2017-05-14 (×2): via INTRAVENOUS

## 2017-05-12 MED ORDER — SODIUM CHLORIDE 0.9 % IV
5 mg/mL | Freq: Four times a day (QID) | INTRAVENOUS | Status: DC | PRN
Start: 2017-05-12 — End: 2017-05-12

## 2017-05-12 MED ORDER — SODIUM CHLORIDE 0.9 % INJECTION
40 mg | Freq: Two times a day (BID) | INTRAMUSCULAR | Status: DC
Start: 2017-05-12 — End: 2017-05-12

## 2017-05-12 MED ORDER — ONDANSETRON (PF) 4 MG/2 ML INJECTION
4 mg/2 mL | Freq: Four times a day (QID) | INTRAMUSCULAR | Status: DC
Start: 2017-05-12 — End: 2017-05-16
  Administered 2017-05-12 – 2017-05-16 (×14): via INTRAVENOUS

## 2017-05-12 MED ORDER — IOPAMIDOL 76 % IV SOLN
370 mg iodine /mL (76 %) | Freq: Once | INTRAVENOUS | Status: AC
Start: 2017-05-12 — End: 2017-05-12
  Administered 2017-05-13: 01:00:00 via INTRAVENOUS

## 2017-05-12 MED ORDER — SODIUM CHLORIDE 0.9 % IJ SYRG
Freq: Once | INTRAMUSCULAR | Status: AC
Start: 2017-05-12 — End: 2017-05-12
  Administered 2017-05-13: 01:00:00 via INTRAVENOUS

## 2017-05-12 MED ORDER — METHYLPREDNISOLONE (PF) 40 MG/ML IJ SOLR
40 mg/mL | Freq: Four times a day (QID) | INTRAMUSCULAR | Status: DC
Start: 2017-05-12 — End: 2017-05-13
  Administered 2017-05-12 – 2017-05-13 (×4): via INTRAVENOUS

## 2017-05-12 MED ORDER — NITROGLYCERIN 0.4 MG SUBLINGUAL TAB
0.4 mg | SUBLINGUAL | Status: DC
Start: 2017-05-12 — End: 2017-05-12
  Administered 2017-05-12: 22:00:00

## 2017-05-12 MED ORDER — INSULIN NPH HUMAN RECOMB 100 UNIT/ML INJECTION
100 unit/mL | Freq: Two times a day (BID) | SUBCUTANEOUS | Status: DC
Start: 2017-05-12 — End: 2017-05-13
  Administered 2017-05-12: 14:00:00 via SUBCUTANEOUS

## 2017-05-12 MED ORDER — PANTOPRAZOLE 40 MG IV SOLR
40 mg | Freq: Two times a day (BID) | INTRAVENOUS | Status: DC
Start: 2017-05-12 — End: 2017-05-17
  Administered 2017-05-13 – 2017-05-17 (×10): via INTRAVENOUS

## 2017-05-12 MED ORDER — IOHEXOL 240 MG/ML IV SOLN
240 mg iodine/mL | Freq: Once | INTRAVENOUS | Status: AC
Start: 2017-05-12 — End: 2017-05-12
  Administered 2017-05-13: 01:00:00 via ORAL

## 2017-05-12 MED ORDER — SODIUM CHLORIDE 0.9 % IV
INTRAVENOUS | Status: DC
Start: 2017-05-12 — End: 2017-05-13
  Administered 2017-05-12: 21:00:00 via INTRAVENOUS

## 2017-05-12 MED FILL — MORPHINE 2 MG/ML INJECTION: 2 mg/mL | INTRAMUSCULAR | Qty: 1

## 2017-05-12 MED FILL — ONDANSETRON (PF) 4 MG/2 ML INJECTION: 4 mg/2 mL | INTRAMUSCULAR | Qty: 2

## 2017-05-12 MED FILL — HYDROXYZINE 25 MG TAB: 25 mg | ORAL | Qty: 2

## 2017-05-12 MED FILL — SOLU-MEDROL (PF) 40 MG/ML SOLUTION FOR INJECTION: 40 mg/mL | INTRAMUSCULAR | Qty: 1

## 2017-05-12 MED FILL — SERTRALINE 50 MG TAB: 50 mg | ORAL | Qty: 2

## 2017-05-12 MED FILL — NITROGLYCERIN 0.4 MG SUBLINGUAL TAB: 0.4 mg | SUBLINGUAL | Qty: 1

## 2017-05-12 MED FILL — ISOVUE-370  76 % INTRAVENOUS SOLUTION: 370 mg iodine /mL (76 %) | INTRAVENOUS | Qty: 100

## 2017-05-12 MED FILL — INSULIN LISPRO 100 UNIT/ML INJECTION: 100 unit/mL | SUBCUTANEOUS | Qty: 1

## 2017-05-12 MED FILL — SOLU-MEDROL (PF) 40 MG/ML SOLUTION FOR INJECTION: 40 mg/mL | INTRAMUSCULAR | Qty: 2

## 2017-05-12 MED FILL — NORMAL SALINE FLUSH 0.9 % INJECTION SYRINGE: INTRAMUSCULAR | Qty: 10

## 2017-05-12 MED FILL — SODIUM CHLORIDE 0.9 % IV: INTRAVENOUS | Qty: 100

## 2017-05-12 MED FILL — MORPHINE 4 MG/ML SYRINGE: 4 mg/mL | INTRAMUSCULAR | Qty: 1

## 2017-05-12 MED FILL — OMNIPAQUE 240 MG IODINE/ML INTRAVENOUS SOLUTION: 240 mg iodine/mL | INTRAVENOUS | Qty: 50

## 2017-05-12 MED FILL — INSULIN NPH HUMAN RECOMB 100 UNIT/ML INJECTION: 100 unit/mL | SUBCUTANEOUS | Qty: 1

## 2017-05-12 MED FILL — PROCHLORPERAZINE EDISYLATE 5 MG/ML INJECTION: 5 mg/mL | INTRAMUSCULAR | Qty: 2

## 2017-05-12 MED FILL — ACETAMINOPHEN 325 MG TABLET: 325 mg | ORAL | Qty: 2

## 2017-05-12 MED FILL — METFORMIN SR 500 MG 24 HR TABLET: 500 mg | ORAL | Qty: 2

## 2017-05-12 MED FILL — SODIUM CHLORIDE 0.9 % IV: INTRAVENOUS | Qty: 1000

## 2017-05-12 MED FILL — TRIAMTERENE-HYDROCHLOROTHIAZIDE 37.5 MG-25 MG TAB: ORAL | Qty: 1

## 2017-05-12 MED FILL — FENTANYL CITRATE (PF) 50 MCG/ML IJ SOLN: 50 mcg/mL | INTRAMUSCULAR | Qty: 2

## 2017-05-12 MED FILL — LOSARTAN 50 MG TAB: 50 mg | ORAL | Qty: 1

## 2017-05-12 MED FILL — SIMVASTATIN 20 MG TAB: 20 mg | ORAL | Qty: 2

## 2017-05-12 MED FILL — NORMAL SALINE FLUSH 0.9 % INJECTION SYRINGE: INTRAMUSCULAR | Qty: 20

## 2017-05-12 NOTE — Progress Notes (Addendum)
Bedside shift change report given to Popel RN (oncoming nurse) by Darnelle MaffucciStacie RN (offgoing nurse). Report included the following information SBAR, Kardex and Recent Results.    1600  Patient complained of chest pain, clutched chest and stated "its the worst pain in my life", rapid response called, lab work, xray, ct ordered and MD called.

## 2017-05-12 NOTE — Progress Notes (Signed)
Care Management Interventions  PCP Verified by CM: Yes  Mode of Transport at Discharge:  (private vehicle)  Transition of [place] (CM Consult): Discharge Planning  Physical Therapy Consult: No  Occupational Therapy Consult: No  Speech Therapy Consult: No  Current Support Network: Own Home  Discharge Location  Discharge Placement: Home     Patient admitted with complaints of hives, DM, HTN.  Patient admitted under observation status.  CM met with patient and presented OBS letter.  Patient signed document and hard signed copy placed on hard chart.    CM verified demographic sheet.  [facility] ([place]  She is independent in adl's and iadl's.  She confirms her PCP as Dr. Fanny Skates and her last office visit she reports as being less than 2 months ago.  Family or friends will assist with d/c transportation when patient is medically ready for discharge.  Dana [name], BSW, CRM

## 2017-05-12 NOTE — Progress Notes (Signed)
Spiritual Care Partner Volunteer visited patient in 6E on 05/12/17.  Documented by:  Chaplain Kristen Taylor, M.Div.  Chaplain Paging Service [phone])

## 2017-05-12 NOTE — Consults (Signed)
Surgery Consult    Subjective:      Dana [name] is a 61 y.o. female who is being seen for evaluation of abdominal pain.  The patient first started developing hives a few days ago [place] noted swelling in her hands with bullae. She tried benadryl but this did not help. With this she developed nausea and vomiting. She was placed on steroid. Today the nausea and vomiting got worse. A rapid response was called as she developed epigastric pain with diaphoresis. She has never had anything similar in the past. She has a history of mild heartburn. She occasionally uses BC powder. She is currently on pepcid  Patient Active Problem List    Diagnosis Date Noted   ??? Hives 05/11/2017   ??? Hypertension complicating diabetes ([place]) 05/25/2016   ??? Morbid obesity due to excess calories ([place]) 05/25/2016   ??? Sleep apnea 02/26/2013   ??? Diabetes ([place]) 01/24/2013   ??? Hypercholesterolemia    ??? HTN (hypertension)      Past Medical History:   Diagnosis Date   ??? Diabetes ([place]) 01/24/2013   ??? GERD (gastroesophageal reflux disease)    ??? HTN (hypertension)    ??? Hypercholesterolemia       Past Surgical History:   Procedure Laterality Date   ??? HX HYSTERECTOMY        Social History   Substance Use Topics   ??? Smoking status: Former Smoker     Packs/day: 1.00     Quit date: 06/27/2011   ??? Smokeless tobacco: Never Used   ??? Alcohol use Not on file      History reviewed. No pertinent family history.   Current Facility-Administered Medications   Medication Dose Route Frequency   ??? insulin NPH (NOVOLIN N, HUMULIN N) injection 25 Units  25 Units SubCUTAneous ACB&D   ??? methylPREDNISolone (PF) (SOLU-MEDROL) injection 80 mg  80 mg IntraVENous Q6H   ??? hydrOXYzine HCl (ATARAX) tablet 50 mg  50 mg Oral Q4H PRN   ??? prochlorperazine (COMPAZINE) with saline injection 5 mg  5 mg IntraVENous Q6H PRN   ??? 0.9% sodium chloride infusion  75 mL/hr IntraVENous CONTINUOUS    ??? famotidine (PF) (PEPCID) 20 mg in sodium chloride 0.9% 10 mL injection  20 mg IntraVENous Q12H   ??? ondansetron (ZOFRAN) injection 4 mg  4 mg IntraVENous Q6H   ??? sucralfate (CARAFATE) 100 mg/mL oral suspension 1 g  1 g Oral AC&HS   ??? pantoprazole (PROTONIX) 40 mg in sodium chloride 0.9% 10 mL injection  40 mg IntraVENous Q12H   ??? morphine injection 2 mg  2 mg IntraVENous Q1H PRN   ??? fentaNYL citrate (PF) injection 50 mcg  50 mcg IntraVENous Q4H PRN   ??? sertraline (ZOLOFT) tablet 100 mg  100 mg Oral DAILY   ??? sodium chloride (NS) flush 5-10 mL  5-10 mL IntraVENous Q8H   ??? sodium chloride (NS) flush 5-10 mL  5-10 mL IntraVENous PRN   ??? acetaminophen (TYLENOL) tablet 650 mg  650 mg Oral Q4H PRN   ??? insulin lispro (HUMALOG) injection   SubCUTAneous TIDAC   ??? glucose chewable tablet 16 g  4 Tab Oral PRN   ??? dextrose (D50W) injection syrg 12.5-25 g  12.5-25 g IntraVENous PRN   ??? glucagon (GLUCAGEN) injection 1 mg  1 mg IntraMUSCular PRN      Allergies   Allergen Reactions   ??? Adhesive Itching   ??? Dolobid [Diflunisal] Itching  Itching,swelling       Review of Systems:    Constitutional: positive for sweats  Eyes: negative  Ears, Nose, Mouth, Throat, and Face: negative  Respiratory: negative  Cardiovascular: negative  Gastrointestinal: positive for nausea, vomiting and abdominal pain  Genitourinary:negative  Integument/Breast: negative  Hematologic/Lymphatic: negative  Musculoskeletal:negative  Neurological: negative  Behavioral/Psychiatric: negative  Endocrine: negative  Allergic/Immunologic: positive for urticaria    Objective:        Visit Vitals   ??? BP 148/88 (BP 1 Location: Right arm, BP Patient Position: At rest)   ??? Pulse 81   ??? Temp 98.1 ??F (36.7 ??C)   ??? Resp 16   ??? Ht 5' 7"  (1.702 m)   ??? Wt 248 lb 0.3 oz (112.5 kg)   ??? SpO2 96%   ??? BMI 38.85 kg/m2       Physical Exam:  GENERAL: alert, cooperative, no distress, appears stated age, EYE: negative, THROAT & NECK: normal, LUNG: clear to auscultation bilaterally,  HEART: regular rate and rhythm, ABDOMEN: Soft with tenderness in the epigastric region, EXTREMITIES:  no edema, SKIN: rash thorughout the abdomen there are bullae of the hand, NEUROLOGIC: negative, PSYCH: non focal    Imaging:  images and reports reviewed  CT- Pneumoperitoneum. Consider perforated gastric ulcer.   Lab/Data Review:  All lab results for the last 24 hours reviewed.    Recent Results (from the past 24 hour(s))   CBC W/O DIFF    Collection Time: 05/12/17  2:05 AM   Result Value Ref Range    WBC 9.1 3.6 - 11.0 K/uL    RBC 5.46 (H) 3.80 - 5.20 M/uL    HGB 16.3 (H) 11.5 - 16.0 g/dL    HCT 51.9 (H) 35.0 - 47.0 %    MCV 95.1 80.0 - 99.0 FL    MCH 29.9 26.0 - 34.0 PG    MCHC 31.4 30.0 - 36.5 g/dL    RDW 14.6 (H) 11.5 - 14.5 %    PLATELET 263 150 - 400 K/uL    MPV 9.9 8.9 - 12.9 FL    NRBC 0.0 0 PER 100 WBC    ABSOLUTE NRBC 0.00 0.00 - 9.50 K/uL   METABOLIC PANEL, COMPREHENSIVE    Collection Time: 05/12/17  5:03 AM   Result Value Ref Range    Sodium 133 (L) 136 - 145 mmol/L    Potassium 4.1 3.5 - 5.1 mmol/L    Chloride 96 (L) 97 - 108 mmol/L    CO2 26 21 - 32 mmol/L    Anion gap 11 5 - 15 mmol/L    Glucose 270 (H) 65 - 100 mg/dL    BUN 30 (H) 6 - 20 MG/DL    Creatinine 1.31 (H) 0.55 - 1.02 MG/DL    BUN/Creatinine ratio 23 (H) 12 - 20      GFR est AA 50 (L) >60 ml/min/1.67m    GFR est non-AA 42 (L) >60 ml/min/1.727m   Calcium 7.5 (L) 8.5 - 10.1 MG/DL    Bilirubin, total 0.9 0.2 - 1.0 MG/DL    ALT (SGPT) 16 12 - 78 U/L    AST (SGOT) 19 15 - 37 U/L    Alk. phosphatase 39 (L) 45 - 117 U/L    Protein, total 6.1 (L) 6.4 - 8.2 g/dL    Albumin 2.6 (L) 3.5 - 5.0 g/dL    Globulin 3.5 2.0 - 4.0 g/dL    A-G Ratio 0.7 (L) 1.1 - 2.2  GLUCOSE, POC    Collection Time: 05/12/17  7:33 AM   Result Value Ref Range    Glucose (POC) 285 (H) 65 - 100 mg/dL    Performed by [place]    Collection Time: 05/12/17  7:38 AM   Result Value Ref Range    Lipase 150 73 - 393 U/L   GLUCOSE, POC     Collection Time: 05/12/17 11:55 AM   Result Value Ref Range    Glucose (POC) 254 (H) 65 - 100 mg/dL    Performed by Ralph Dowdy    GLUCOSE, POC    Collection Time: 05/12/17  4:29 PM   Result Value Ref Range    Glucose (POC) 200 (H) 65 - 100 mg/dL    Performed by Sherlene Shams Samantha    TROPONIN I    Collection Time: 05/12/17  5:01 PM   Result Value Ref Range    Troponin-I, Qt. <0.05 <0.05 ng/mL       Assessment:     Perforated ulcer     Plan:     1. I recommend proceeding with Surgery:  Open repair of perforated ulcer. Emergently     2. Discussed aspects of surgical intervention, methods, risks (including by not limited to infection, bleeding, hematoma, and perforation of the intestines or solid organs), and the risks of general anesthetic.  The patient understands the risks; any and all questions were answered to the patient's satisfaction.    3. Patient does wish to proceed with surgery.      Signed By: Merrily Brittle, MD     May 12, 2017

## 2017-05-12 NOTE — Progress Notes (Signed)
Medical Progress Note      NAME: Dana [name]   DOB:  08/09/1956  MRM:  [id number]    Date/Time: 05/12/2017  6:43 PM         Problem List:     Active Problems:    Hypercholesterolemia ()      HTN (hypertension) ()      Diabetes ([facility]) (01/24/2013)      Hypertension complicating diabetes ([facility]) (05/25/2016)      Morbid obesity due to excess calories ([facility]) (05/25/2016)      Hives (05/11/2017)             Subjective:     Patient has been vomiting throughout the afternoon,  Then developed chest pain, rapid response called. ekg normal.  Then patient developed epigastric pain, with diaphoresis.      Past Medical History:   Diagnosis Date   ??? Diabetes ([facility]) 01/24/2013   ??? GERD (gastroesophageal reflux disease)    ??? HTN (hypertension)    ??? Hypercholesterolemia        ROS:  General: negative for fever, chills, sweats, weakness  Respiratory:  negative for cough, sputum production, SOB, wheezing, DOE, pleuritic pain  Cardiology:  positive for chest.epigastric pain  Gastrointestinal: positive for epig pain         Objective:       Vitals:          Last 24hrs VS reviewed since prior progress note. Most recent are:    Visit Vitals   ??? BP 112/66 (BP 1 Location: Left arm, BP Patient Position: At rest)   ??? Pulse 88   ??? Temp 99.1 ??F (37.3 ??C)   ??? Resp 16   ??? Ht 5\' 7"  (1.702 m)   ??? Wt 248 lb 0.3 oz (112.5 kg)   ??? SpO2 94%   ??? BMI 38.85 kg/m2     SpO2 Readings from Last 6 Encounters:   05/12/17 94%   05/20/15 94%   11/30/10 100%          Intake/Output Summary (Last 24 hours) at 05/12/17 1843  Last data filed at 05/12/17 0021   Gross per 24 hour   Intake              120 ml   Output                0 ml   Net              120 ml          Exam:     General   Obese 61yo wf  Respiratory   Clear To Auscultation bilaterally - no wheezes, rales, rhonchi, or crackles  Cardiology  regular  Abdominal   Generalized Tenderness, non distended, decreased bowel sounds  Extremities  +1 ue edema  Hives over entire body    Lab Data Reviewed: (see below)     Medications Reviewed: (see below)    ______________________________________________________________________    Medications:     Current Facility-Administered Medications   Medication Dose Route Frequency   ??? insulin NPH (NOVOLIN N, HUMULIN N) injection 25 Units  25 Units SubCUTAneous ACB&D   ??? methylPREDNISolone (PF) (SOLU-MEDROL) injection 80 mg  80 mg IntraVENous Q6H   ??? hydrOXYzine HCl (ATARAX) tablet 50 mg  50 mg Oral Q4H PRN   ??? prochlorperazine (COMPAZINE) with saline injection 5 mg  5 mg IntraVENous Q6H PRN   ??? 0.9% sodium chloride infusion  75 mL/hr IntraVENous CONTINUOUS   ???  famotidine (PF) (PEPCID) 20 mg in sodium chloride 0.9% 10 mL injection  20 mg IntraVENous Q12H   ??? ondansetron (ZOFRAN) injection 4 mg  4 mg IntraVENous Q6H   ??? sucralfate (CARAFATE) 100 mg/mL oral suspension 1 g  1 g Oral AC&HS   ??? pantoprazole (PROTONIX) 40 mg in sodium chloride 0.9% 10 mL injection  40 mg IntraVENous Q12H   ??? morphine injection 2 mg  2 mg IntraVENous Q1H PRN   ??? fentaNYL citrate (PF) injection 50 mcg  50 mcg IntraVENous Q4H PRN   ??? sertraline (ZOLOFT) tablet 100 mg  100 mg Oral DAILY   ??? sodium chloride (NS) flush 5-10 mL  5-10 mL IntraVENous Q8H   ??? sodium chloride (NS) flush 5-10 mL  5-10 mL IntraVENous PRN   ??? acetaminophen (TYLENOL) tablet 650 mg  650 mg Oral Q4H PRN   ??? insulin lispro (HUMALOG) injection   SubCUTAneous TIDAC   ??? glucose chewable tablet 16 g  4 Tab Oral PRN   ??? dextrose (D50W) injection syrg 12.5-25 g  12.5-25 g IntraVENous PRN   ??? glucagon (GLUCAGEN) injection 1 mg  1 mg IntraMUSCular PRN            Lab Review:     Recent Labs      05/12/17   0205  05/11/17   0738   WBC  9.1  12.3*   HGB  16.3*  15.4   HCT  51.9*  46.2   PLT  263  242     Recent Labs      05/12/17   0503  05/11/17   0738   NA  133*  135*   K  4.1  4.0   CL  96*  101   CO2  26  22   GLU  270*  178*   BUN  30*  17   CREA  1.31*  0.91   CA  7.5*  8.4*   ALB  2.6*  3.1*   TBILI  0.9  0.6   SGOT  19  19   ALT  16  19    INR   --   1.0     Lab Results   Component Value Date/Time    Glucose (POC) 200 (H) 05/12/2017 04:29 PM    Glucose (POC) 254 (H) 05/12/2017 11:55 AM    Glucose (POC) 285 (H) 05/12/2017 07:33 AM    Glucose (POC) 252 (H) 05/11/2017 09:56 PM    Glucose (POC) 337 (H) 05/11/2017 06:12 PM     No results for input(s): PH, PCO2, PO2, HCO3, FIO2 in the last 72 hours.  Recent Labs      05/11/17   0738   INR  1.0       Other pertinent lab: NA         Assessment:     Patient Active Problem List   Diagnosis Code   ??? Hypercholesterolemia E78.00   ??? HTN (hypertension) I10   ??? Diabetes (HCC) E11.9   ??? Sleep apnea G47.30   ??? Hypertension complicating diabetes ([facility]) E11.59, I10   ??? Morbid obesity due to excess calories ([facility]) E66.01   ??? Hives L50.9          Plan:                 1. abd pain- upright chest shows no free air, check ct scan  2. Pain management,  3. Hydrate,  ___________________________________________________    Attending Physician: Genene Churn, MD

## 2017-05-12 NOTE — Progress Notes (Signed)
Problem: Falls - Risk of  Goal: *Absence of Falls  Document Schmid Fall Risk and appropriate interventions in the flowsheet.   Outcome: Progressing Towards Goal  Fall Risk Interventions:            Medication Interventions: Patient to call before getting OOB

## 2017-05-12 NOTE — Consults (Addendum)
[place]    [facility]   [address]  [place] [address]        GASTROENTEROLOGY CONSULTATION NOTE  Riley Lam, [place]  [phone] office  [phone] NP in-hospital cell phone M-F until 4:30  After 5pm or on weekends, please call operator for physician on call        NAME:  Dana [name]   DOB:   1956/09/06   MRN:   [id number]       Referring Physician: Fayrene Fearing    Consult Date: 05/12/2017 4:29 PM    Chief Complaint: nausea and vomiting     History of Present Illness:  Patient is a 61 y.o. who is seen in consultation at the request of Dr. Cherly Hensen for nausea and vomiting. Medical history as listed below includes diabetes, A1C 7.1. She reports having an upset stomach Monday evening. Tuesday she noticed hives/rash with urticaria. Her upset stomach progressed to epigastric pain with nausea and vomiting. She has not been able to keep much PO down since Tuesday. She has mild improvement in swelling and urticaria since steroids and H2 blockers. She denies hematemesis, lower abdominal pain, constipation, diarrhea, change in bowel habits, melena, or hematochezia.    She reports rare alcohol use. She uses BC powder a couple times daily, none this week. She denies smoking.     Colonoscopy August 2018 by Dr. Dagmar Hait normal     I have reviewed the emergency room note, hospital admission note, notes by all other clinicians who have seen the patient during this hospitalization to date. I have reviewed the problem list and the reason for this hospitalization. I have reviewed the allergies and the medications the patient was taking at home prior to this hospitalization.    PMH:  Past Medical History:   Diagnosis Date   ??? Diabetes (HCC) 01/24/2013   ??? GERD (gastroesophageal reflux disease)    ??? HTN (hypertension)    ??? Hypercholesterolemia        PSH:  Past Surgical History:   Procedure Laterality Date   ??? HX HYSTERECTOMY         Allergies:  Allergies   Allergen Reactions   ??? Adhesive Itching    ??? Dolobid [Diflunisal] Itching     Itching,swelling       Home Medications:  Prior to Admission Medications   Prescriptions Last Dose Informant Patient Reported? Taking?   insulin NPH (HUMULIN N NPH INSULIN KWIKPEN) 100 unit/mL (3 mL) inpn 05/10/2017 at Unknown time  No Yes   Sig: INJECT 32 UNITS SUBCUTANEOUSLY AT BEDTIME   lidocaine (LIDODERM) 5 % Not Taking at Unknown time  Yes No   Sig: 1 Patch by TransDERmal route every twelve (12) hours as needed. Apply patch to the affected area for 12 hours a day and remove for 12 hours a day.   losartan (COZAAR) 50 mg tablet 05/10/2017 at Unknown time  Yes Yes   Sig: Take 50 mg by mouth nightly.   metFORMIN ER (GLUCOPHAGE XR) 500 mg tablet 05/11/2017 at Unknown time  Yes Yes   Sig: Take 1,000 mg by mouth two (2) times daily (with meals).   sertraline (ZOLOFT) 100 mg tablet 05/11/2017 at Unknown time  No Yes   Sig: TAKE 1 TABLET BY MOUTH DAILY   simvastatin (ZOCOR) 40 mg tablet 05/10/2017 at Unknown time  No Yes   Sig: TAKE 1 TABLET NIGHTLY   triamterene-hydroCHLOROthiazide (MAXZIDE) 37.5-25 mg per tablet 05/11/2017 at Unknown time  No Yes  Sig: TAKE 1 TABLET DAILY      Facility-Administered Medications: None       Hospital Medications:  Current Facility-Administered Medications   Medication Dose Route Frequency   ??? insulin NPH (NOVOLIN N, HUMULIN N) injection 25 Units  25 Units SubCUTAneous ACB&D   ??? methylPREDNISolone (PF) (SOLU-MEDROL) injection 80 mg  80 mg IntraVENous Q6H   ??? hydrOXYzine HCl (ATARAX) tablet 50 mg  50 mg Oral Q4H PRN   ??? prochlorperazine (COMPAZINE) with saline injection 5 mg  5 mg IntraVENous Q6H PRN   ??? 0.9% sodium chloride infusion  75 mL/hr IntraVENous CONTINUOUS   ??? famotidine (PF) (PEPCID) 20 mg in sodium chloride 0.9% 10 mL injection  20 mg IntraVENous Q12H   ??? sertraline (ZOLOFT) tablet 100 mg  100 mg Oral DAILY   ??? triamterene-hydroCHLOROthiazide (MAXZIDE) 37.5-25 mg per tablet 1 Tab  1 Tab Oral DAILY    ??? sodium chloride (NS) flush 5-10 mL  5-10 mL IntraVENous Q8H   ??? sodium chloride (NS) flush 5-10 mL  5-10 mL IntraVENous PRN   ??? acetaminophen (TYLENOL) tablet 650 mg  650 mg Oral Q4H PRN   ??? insulin lispro (HUMALOG) injection   SubCUTAneous TIDAC   ??? glucose chewable tablet 16 g  4 Tab Oral PRN   ??? dextrose (D50W) injection syrg 12.5-25 g  12.5-25 g IntraVENous PRN   ??? glucagon (GLUCAGEN) injection 1 mg  1 mg IntraMUSCular PRN   ??? ondansetron (ZOFRAN) injection 4 mg  4 mg IntraVENous Q6H PRN       Social History:  Social History   Substance Use Topics   ??? Smoking status: Former Smoker     Packs/day: 1.00     Quit date: 06/27/2011   ??? Smokeless tobacco: Never Used   ??? Alcohol use Not on file       Family History:  No family history on file.    Review of Systems:  Constitutional: negative fever, +chills, negative weight loss  Eyes:   negative visual changes  ENT:   +sore throat (improving), +tongue or lip swelling  Respiratory:  +cough (improving), negative dyspnea  Cards:  negative for chest pain, palpitations, lower extremity edema  GI:   See HPI  GU:  negative for frequency, dysuria  Integument:  ++for rash and pruritus  Heme:  negative for easy bruising and gum/nose bleeding  Musculoskeletal:negative for myalgias and muscle weakness; + back pain  Neuro:    negative for dizziness, vertigo; +headaches  Psych:  negative for feelings of anxiety, depression     Objective:   Patient Vitals for the past 8 hrs:   BP Temp Pulse Resp   05/12/17 1124 101/64 98.4 ??F (36.9 ??C) 84 16   05/12/17 0941 (!) 89/72 98.5 ??F (36.9 ??C) 74 16        09/04 1901 - 09/06 0700  In: 120 [P.O.:120]  Out: -     EXAM:     CONST:  Pleasant lady lying in bed, no acute distress   NEURO:  alert and oriented x 3   HEENT: EOMI, no scleral icterus   LUNGS: clear to ausculation, (-) wheeze   CARD:  regular rate and rhythm, S1 S2   ABD:  soft, obese, epigastric tenderness, no rebound, bowel sounds (+) all 4 quadrants, no masses, non distended    EXT:  Hives, warm   PSYCH: full, not anxious     Data Review     Recent Labs  05/12/17   0205  05/11/17   0738   WBC  9.1  12.3*   HGB  16.3*  15.4   HCT  51.9*  46.2   PLT  263  242     Recent Labs      05/12/17   0503  05/11/17   0738   NA  133*  135*   K  4.1  4.0   CL  96*  101   CO2  26  22   BUN  30*  17   CREA  1.31*  0.91   GLU  270*  178*   CA  7.5*  8.4*     Recent Labs      05/12/17   0503  05/11/17   0738   SGOT  19  19   AP  39*  59   TP  6.1*  7.1   ALB  2.6*  3.1*   GLOB  3.5  4.0     Recent Labs      05/11/17   0738   INR  1.0   PTP  10.3   APTT  26.0     Assessment:   ?? Epigastric pain/nausea/vomiting: symptoms began with onset of hives; regular NSAID use     Patient Active Problem List   Diagnosis Code   ??? Hypercholesterolemia E78.00   ??? HTN (hypertension) I10   ??? Diabetes (HCC) E11.9   ??? Sleep apnea G47.30   ??? Hypertension complicating diabetes (HCC) E11.59, I10   ??? Morbid obesity due to excess calories (HCC) E66.01   ??? Hives L50.9     Plan:     ?? Add PPI and Carafate   ?? Scheduled Zofran   ?? Consider EGD when recovers from reaction   ?? Thank you for allowing me to participate in care of Dana [name]     Signed By: Lafayette DragonMolly M Sandel, NP     05/12/2017  4:29 PM     GI Attending: Patient seen and examined. [facility] until recently. For her allergic reaction, she was understandably started on prednisone. It is possible that she may have developed a peptic ulcer in the setting of NSAID's + prednisone. Agree with CT as per primary team's plan. Will empirically treat with PPI and Carafate for now. Plan for EGD if symptoms do not improve and will await CT results. Dr. Dagmar Haithanjan to follow tomorrow.    Terria Deschepper P. Tally Joealreja, MD

## 2017-05-12 NOTE — Progress Notes (Signed)
Medical Progress Note      NAME: Dana [name]   DOB:  09-05-1956  MRM:  [id number]    Date/Time: 05/12/2017  8:03 AM         Problem List:     Active Problems:    Hypercholesterolemia ()      HTN (hypertension) ()      Diabetes (HCC) (01/24/2013)      Hypertension complicating diabetes (HCC) (05/25/2016)      Morbid obesity due to excess calories (HCC) (05/25/2016)      Hives (05/11/2017)             Subjective:     Patient still breaking out and itching,  Hands still swollen    Past Medical History:   Diagnosis Date   ??? Diabetes (HCC) 01/24/2013   ??? GERD (gastroesophageal reflux disease)    ??? HTN (hypertension)    ??? Hypercholesterolemia        ROS:  General: negative for fever, chills, sweats, weakness  Respiratory:  negative for cough, sputum production, SOB, wheezing, DOE, pleuritic pain  Cardiology:  negative for chest pain, palpitations, orthopnea, PND, edema, syncope   Gastrointestinal: positive for nausea/vomting         Objective:       Vitals:          Last 24hrs VS reviewed since prior progress note. Most recent are:    Visit Vitals   ??? BP 97/61 (BP 1 Location: Right arm, BP Patient Position: At rest)   ??? Pulse 87   ??? Temp 99.6 ??F (37.6 ??C)   ??? Resp 16   ??? Ht  (1.702 m)   ??? Wt 248 lb 0.3 oz (112.5 kg)   ??? SpO2 90%   ??? BMI 38.85 kg/m2     SpO2 Readings from Last 6 Encounters:   05/12/17 90%   05/20/15 94%   11/30/10 100%          Intake/Output Summary (Last 24 hours) at 05/12/17 0803  Last data filed at 05/12/17 0021   Gross per 24 hour   Intake              120 ml   Output                0 ml   Net              120 ml          Exam:     General   Obese 61 yo wf  Respiratory   Clear To Auscultation bilaterally - no wheezes, rales, rhonchi, or crackles  Cardiology  Regular Rate and Rythmn  - no murmurs, rubs or gallops  Abdominal  Soft, non-tender, non-distended, positive bowel sounds, no hepatosplenomegaly  Extremities  +2 upper ext edema with hemorrhagic bullae on hands  Skin- covered with hives     Lab Data Reviewed: (see below)    Medications Reviewed: (see below)    ______________________________________________________________________    Medications:     Current Facility-Administered Medications   Medication Dose Route Frequency   ??? insulin NPH (NOVOLIN N, HUMULIN N) injection 25 Units  25 Units SubCUTAneous ACB&D   ??? methylPREDNISolone (PF) (SOLU-MEDROL) injection 80 mg  80 mg IntraVENous Q6H   ??? hydrOXYzine HCl (ATARAX) tablet 50 mg  50 mg Oral Q4H PRN   ??? losartan (COZAAR) tablet 50 mg  50 mg Oral QHS   ??? metFORMIN ER (GLUCOPHAGE XR) tablet 1,000 mg  1,000 mg Oral BID WITH MEALS   ??? sertraline (ZOLOFT) tablet 100 mg  100 mg Oral DAILY   ??? simvastatin (ZOCOR) tablet 40 mg  40 mg Oral QHS   ??? triamterene-hydroCHLOROthiazide (MAXZIDE) 37.5-25 mg per tablet 1 Tab  1 Tab Oral DAILY   ??? sodium chloride (NS) flush 5-10 mL  5-10 mL IntraVENous Q8H   ??? sodium chloride (NS) flush 5-10 mL  5-10 mL IntraVENous PRN   ??? acetaminophen (TYLENOL) tablet 650 mg  650 mg Oral Q4H PRN   ??? insulin lispro (HUMALOG) injection   SubCUTAneous TIDAC   ??? glucose chewable tablet 16 g  4 Tab Oral PRN   ??? dextrose (D50W) injection syrg 12.5-25 g  12.5-25 g IntraVENous PRN   ??? glucagon (GLUCAGEN) injection 1 mg  1 mg IntraMUSCular PRN   ??? ondansetron (ZOFRAN) injection 4 mg  4 mg IntraVENous Q6H PRN            Lab Review:     Recent Labs      05/12/17   0205  05/11/17   0738   WBC  9.1  12.3*   HGB  16.3*  15.4   HCT  51.9*  46.2   PLT  263  242     Recent Labs      05/12/17   0503  05/11/17   0738   NA  133*  135*   K  4.1  4.0   CL  96*  101   CO2  26  22   GLU  270*  178*   BUN  30*  17   CREA  1.31*  0.91   CA  7.5*  8.4*   ALB  2.6*  3.1*   TBILI  0.9  0.6   SGOT  19  19   ALT  16  19   INR   --   1.0     Lab Results   Component Value Date/Time    Glucose (POC) 285 (H) 05/12/2017 07:33 AM    Glucose (POC) 252 (H) 05/11/2017 09:56 PM    Glucose (POC) 337 (H) 05/11/2017 06:12 PM    Glucose POC 584 (A) 04/05/2012 03:30 PM      No results for input(s): PH, PCO2, PO2, HCO3, FIO2 in the last 72 hours.  Recent Labs      05/11/17   0738   INR  1.0       Other pertinent lab: NA         Assessment:     Patient Active Problem List   Diagnosis Code   ??? Hypercholesterolemia E78.00   ??? HTN (hypertension) I10   ??? Diabetes (HCC) E11.9   ??? Sleep apnea G47.30   ??? Hypertension complicating diabetes (HCC) E11.59, I10   ??? Morbid obesity due to excess calories (HCC) E66.01   ??? Hives L50.9          Plan:                 1. Refractory urticaria - increase steroids  2. Diabetes- increase insulin  3. zofran for nausea, vistaril for itching                ___________________________________________________    Attending Physician: Lake BellsNancy J Kalon Erhardt, MD

## 2017-05-12 NOTE — Progress Notes (Addendum)
Bedside shift change report given to Barth Kirkseri, RN (oncoming nurse) by Etheleen MayhewPopel, RN (offgoing nurse). Report included the following information SBAR, Kardex and Recent Results.       Abd CT results called back. Dr Cherly HensenPahle notified of possible perforated ulcer.   Orders received for consult to Gen Surgery. Dr Amada JupiterShindel to see patient.

## 2017-05-12 NOTE — Other (Signed)
DTC Progress Note    Recommendations/ Comments: Chart reviewed for extreme hyperglycemia, likely due to steroids.   Noted Solumedrol was increased to 80 mg every 6 hours and NPH increased to 25 units BID    If appropriate, please consider:  - Changing current dosing of NPH to every 12 hours instead of BID   - Changing correction scale to insulin resistant scale while on current dose of steroids    Current hospital DM medication: NPH insulin 25 units BID, Humalog for correction, normal sensitivity scale     Chart reviewed on Dana [name].    Patient is a 61 y.o. female with hx Type 2 Diabetes on NPH insulin 32 units at bedtime and Metformin 1000 mg BID at home.    A1c:   Lab Results   Component Value Date/Time    Hemoglobin A1c 7.2 (H) 01/31/2015 03:52 PM    Hemoglobin A1c 7.9 (H) 11/16/2013 02:28 PM       Recent Glucose Results:   Lab Results   Component Value Date/Time    GLU 270 (H) 05/12/2017 05:03 AM    GLUCPOC 285 (H) 05/12/2017 07:33 AM    GLUCPOC 252 (H) 05/11/2017 09:56 PM    GLUCPOC 337 (H) 05/11/2017 06:12 PM        Lab Results   Component Value Date/Time    Creatinine 1.31 (H) 05/12/2017 05:03 AM     Estimated Creatinine Clearance: 59.9 mL/min (based on Cr of 1.31).    Active Orders   Diet    DIET DIABETIC CONSISTENT CARB Regular        PO intake: No data found.      Will continue to follow as needed.    Thank you  Burlene ArntNeysa Serra-Valentin, RD, CDE  [facility]  Pager: [phone]

## 2017-05-12 NOTE — Progress Notes (Signed)
Spiritual Care Assessment/Progress Note  [facility]      NAME: Dana [name]      MRN: [id number]  AGE: 61 y.o. SEX: female  Religious Affiliation: Methodist   Language: English     05/12/2017     Total Time (in minutes): 5     Spiritual Assessment begun in [facility] through conversation with:         [] Patient        []  Family    []  Friend(s)        Reason for Consult: Rapid response team     Spiritual beliefs: (Please include comment if needed)     []  Identifies with a faith tradition:         []  Supported by a faith community:            []  Claims no spiritual orientation:           []  Seeking spiritual identity:                []  Adheres to an individual form of spirituality:           [x]  Not able to assess:                           Identified resources for coping:      []  Prayer                               []  Music                  []  Guided Imagery     []  Family/friends                 []  Pet visits     []  Devotional reading                         []  Unknown     []  Other:                                               Interventions offered during this visit: (See comments for more details)    Patient Interventions: Crisis           Plan of Care:     []  Support spiritual and/or cultural needs    []  Support AMD and/or advance care planning process      []  Support grieving process   []  Coordinate Rites and/or Rituals    []  Coordination with community clergy   []  No spiritual needs identified at this time   []  Detailed Plan of Care below (See Comments)  []  Make referral to Music Therapy  []  Make referral to Pet Therapy     []  Make referral to Addiction services  []  [facility]  []  Make referral to Spiritual Care Partner  []  No future visits requested        []  Follow up visits as needed     Comments: Responded to RRT called for Dana [name] in room 604/01. Multiple staff members were attending to patient and no family present; unable to  do spiritual assessment at that time. Please notify Spiritual Care Services if chaplain support  desired. Chaplain: Rev. Janetta Hora. Cobb, MDiv; BCC, to contact [place] call: [phone]

## 2017-05-12 NOTE — Consults (Signed)
Surgery Consult    Subjective:      Dana [name] is a 61 y.o. female who is being seen for evaluation of abdominal pain.  The patient first started developing hives a few days ago [place]She noted swelling in her hands with bullae. She tried benadryl but this did not help. With this she developed nausea and vomiting. She was placed on steroid. Today the nausea and vomiting got worse. A rapid response was called as she developed epigastric pain with diaphoresis. She has never had anything similar in the past. She has a history of mild heartburn. She occasionally uses BC powder. She is currently on pepcid  Patient Active Problem List    Diagnosis Date Noted   ??? Hives 05/11/2017   ??? Hypertension complicating diabetes ([place]) 05/25/2016   ??? Morbid obesity due to excess calories ([place]) 05/25/2016   ??? Sleep apnea 02/26/2013   ??? Diabetes ([place]) 01/24/2013   ??? Hypercholesterolemia    ??? HTN (hypertension)      Past Medical History:   Diagnosis Date   ??? Diabetes ([place]) 01/24/2013   ??? GERD (gastroesophageal reflux disease)    ??? HTN (hypertension)    ??? Hypercholesterolemia       Past Surgical History:   Procedure Laterality Date   ??? HX HYSTERECTOMY        Social History   Substance Use Topics   ??? Smoking status: Former Smoker     Packs/day: 1.00     Quit date: 06/27/2011   ??? Smokeless tobacco: Never Used   ??? Alcohol use Not on file      History reviewed. No pertinent family history.   Current Facility-Administered Medications   Medication Dose Route Frequency   ??? insulin NPH (NOVOLIN N, HUMULIN N) injection 25 Units  25 Units SubCUTAneous ACB&D   ??? methylPREDNISolone (PF) (SOLU-MEDROL) injection 80 mg  80 mg IntraVENous Q6H   ??? hydrOXYzine HCl (ATARAX) tablet 50 mg  50 mg Oral Q4H PRN   ??? prochlorperazine (COMPAZINE) with saline injection 5 mg  5 mg IntraVENous Q6H PRN   ??? 0.9% sodium chloride infusion  75 mL/hr IntraVENous CONTINUOUS   ??? famotidine (PF) (PEPCID) 20 mg in sodium chloride 0.9%  10 mL injection  20 mg IntraVENous Q12H   ??? ondansetron (ZOFRAN) injection 4 mg  4 mg IntraVENous Q6H   ??? sucralfate (CARAFATE) 100 mg/mL oral suspension 1 g  1 g Oral AC&HS   ??? pantoprazole (PROTONIX) 40 mg in sodium chloride 0.9% 10 mL injection  40 mg IntraVENous Q12H   ??? morphine injection 2 mg  2 mg IntraVENous Q1H PRN   ??? fentaNYL citrate (PF) injection 50 mcg  50 mcg IntraVENous Q4H PRN   ??? sertraline (ZOLOFT) tablet 100 mg  100 mg Oral DAILY   ??? sodium chloride (NS) flush 5-10 mL  5-10 mL IntraVENous Q8H   ??? sodium chloride (NS) flush 5-10 mL  5-10 mL IntraVENous PRN   ??? acetaminophen (TYLENOL) tablet 650 mg  650 mg Oral Q4H PRN   ??? insulin lispro (HUMALOG) injection   SubCUTAneous TIDAC   ??? glucose chewable tablet 16 g  4 Tab Oral PRN   ??? dextrose (D50W) injection syrg 12.5-25 g  12.5-25 g IntraVENous PRN   ??? glucagon (GLUCAGEN) injection 1 mg  1 mg IntraMUSCular PRN      Allergies   Allergen Reactions   ??? Adhesive Itching   ??? Dolobid [Diflunisal] Itching  Itching,swelling       Review of Systems:    Constitutional: positive for sweats  Eyes: negative  Ears, Nose, Mouth, Throat, and Face: negative  Respiratory: negative  Cardiovascular: negative  Gastrointestinal: positive for nausea, vomiting and abdominal pain  Genitourinary:negative  Integument/Breast: negative  Hematologic/Lymphatic: negative  Musculoskeletal:negative  Neurological: negative  Behavioral/Psychiatric: negative  Endocrine: negative  Allergic/Immunologic: positive for urticaria    Objective:        Visit Vitals   ??? BP 148/88 (BP 1 Location: Right arm, BP Patient Position: At rest)   ??? Pulse 81   ??? Temp 98.1 ??F (36.7 ??C)   ??? Resp 16   ??? Ht '5\' 7"'  (1.702 m)   ??? Wt 248 lb 0.3 oz (112.5 kg)   ??? SpO2 96%   ??? BMI 38.85 kg/m2       Physical Exam:  GENERAL: alert, cooperative, no distress, appears stated age, EYE: negative, THROAT & NECK: normal, LUNG: clear to auscultation bilaterally, HEART: regular rate and rhythm, ABDOMEN: Soft with  tenderness in the epigastric region, EXTREMITIES:  no edema, SKIN: rash thorughout the abdomen there are bullae of the hand, NEUROLOGIC: negative, PSYCH: non focal    Imaging:  images and reports reviewed  CT- Pneumoperitoneum. Consider perforated gastric ulcer.   Lab/Data Review:  All lab results for the last 24 hours reviewed.    Recent Results (from the past 24 hour(s))   CBC W/O DIFF    Collection Time: 05/12/17  2:05 AM   Result Value Ref Range    WBC 9.1 3.6 - 11.0 K/uL    RBC 5.46 (H) 3.80 - 5.20 M/uL    HGB 16.3 (H) 11.5 - 16.0 g/dL    HCT 51.9 (H) 35.0 - 47.0 %    MCV 95.1 80.0 - 99.0 FL    MCH 29.9 26.0 - 34.0 PG    MCHC 31.4 30.0 - 36.5 g/dL    RDW 14.6 (H) 11.5 - 14.5 %    PLATELET 263 150 - 400 K/uL    MPV 9.9 8.9 - 12.9 FL    NRBC 0.0 0 PER 100 WBC    ABSOLUTE NRBC 0.00 0.00 - 2.69 K/uL   METABOLIC PANEL, COMPREHENSIVE    Collection Time: 05/12/17  5:03 AM   Result Value Ref Range    Sodium 133 (L) 136 - 145 mmol/L    Potassium 4.1 3.5 - 5.1 mmol/L    Chloride 96 (L) 97 - 108 mmol/L    CO2 26 21 - 32 mmol/L    Anion gap 11 5 - 15 mmol/L    Glucose 270 (H) 65 - 100 mg/dL    BUN 30 (H) 6 - 20 MG/DL    Creatinine 1.31 (H) 0.55 - 1.02 MG/DL    BUN/Creatinine ratio 23 (H) 12 - 20      GFR est AA 50 (L) >60 ml/min/1.57m    GFR est non-AA 42 (L) >60 ml/min/1.719m   Calcium 7.5 (L) 8.5 - 10.1 MG/DL    Bilirubin, total 0.9 0.2 - 1.0 MG/DL    ALT (SGPT) 16 12 - 78 U/L    AST (SGOT) 19 15 - 37 U/L    Alk. phosphatase 39 (L) 45 - 117 U/L    Protein, total 6.1 (L) 6.4 - 8.2 g/dL    Albumin 2.6 (L) 3.5 - 5.0 g/dL    Globulin 3.5 2.0 - 4.0 g/dL    A-G Ratio 0.7 (L) 1.1 - 2.2  GLUCOSE, POC    Collection Time: 05/12/17  7:33 AM   Result Value Ref Range    Glucose (POC) 285 (H) 65 - 100 mg/dL    Performed by [place]    Collection Time: 05/12/17  7:38 AM   Result Value Ref Range    Lipase 150 73 - 393 U/L   GLUCOSE, POC    Collection Time: 05/12/17 11:55 AM   Result Value Ref Range    Glucose (POC)  254 (H) 65 - 100 mg/dL    Performed by Ralph Dowdy    GLUCOSE, POC    Collection Time: 05/12/17  4:29 PM   Result Value Ref Range    Glucose (POC) 200 (H) 65 - 100 mg/dL    Performed by Sherlene Shams Samantha    TROPONIN I    Collection Time: 05/12/17  5:01 PM   Result Value Ref Range    Troponin-I, Qt. <0.05 <0.05 ng/mL       Assessment:     Perforated ulcer     Plan:     1. I recommend proceeding with Surgery:  Open repair of perforated ulcer. Emergently     2. Discussed aspects of surgical intervention, methods, risks (including by not limited to infection, bleeding, hematoma, and perforation of the intestines or solid organs), and the risks of general anesthetic.  The patient understands the risks; any and all questions were answered to the patient's satisfaction.    3. Patient does wish to proceed with surgery.      Signed By: Merrily Brittle, MD     May 12, 2017

## 2017-05-12 NOTE — Consults (Signed)
Consults  by Erling Contealreja, Derward Marple P., MD at 05/12/17 1629                Author: Erling Contealreja, Victorian Gunn P., MD  Service: Gastroenterology  Author Type: Physician       Filed: 05/12/17 2136  Date of Service: 05/12/17 1629  Status: Addendum          Editor: Erling Contealreja, Chazlyn Cude P., MD (Physician)          Related Notes: Original Note by Lafayette DragonSandel, Molly M, NP (Nurse Practitioner) filed at 05/12/17 1641            Consult Orders        1. IP CONSULT TO GASTROENTEROLOGY [[id number]] ordered by Lake BellsPahle, Nancy J, MD at 05/12/17 1546                                 [place]     [facility]    [address]   [place] [id number]           GASTROENTEROLOGY CONSULTATION NOTE   Riley LamMolly Sandel, [place]NP   [phone] office   [phone] NP in-hospital cell phone M-F until 4:30   After 5pm or on weekends, please call operator for physician on call             NAME:  Dana [name]     DOB:   [birth year]    MRN:   [id number]           Referring Physician: Fayrene FearingNancy Pahle      Consult Date: 05/12/2017  4:29 PM      Chief Complaint: nausea and vomiting       History of Present Illness:  Patient is a  61 y.o. who is seen in consultation at the request of Dr. Cherly HensenPahle for nausea and vomiting. Medical history as listed below includes diabetes, A1C 7.1. She reports having an upset stomach Monday evening.  Tuesday she noticed hives/rash with urticaria. Her upset stomach progressed to epigastric pain with nausea and vomiting. She has not been able to keep much PO down since Tuesday. She has mild improvement in swelling and urticaria since steroids and H2  blockers. She denies hematemesis, lower abdominal pain, constipation, diarrhea, change in bowel habits, melena, or hematochezia.      She reports rare alcohol use. She uses BC powder a couple times daily, none this week. She denies smoking.       Colonoscopy August 2018 by Dr. Dagmar Haithanjan normal       I have reviewed the emergency room note, hospital admission note, notes by all other clinicians who have  seen the patient during this hospitalization to date. I have reviewed the problem list and the  reason for this hospitalization. I have reviewed the allergies and the medications the patient was taking at home prior to this hospitalization.      PMH:     Past Medical History:        Diagnosis  Date         ?  Diabetes (HCC)  01/24/2013     ?  GERD (gastroesophageal reflux disease)       ?  HTN (hypertension)           ?  Hypercholesterolemia             PSH:     Past Surgical History:         Procedure  Laterality  Date          ?  HX HYSTERECTOMY               Allergies:     Allergies        Allergen  Reactions         ?  Adhesive  Itching     ?  Dolobid [Diflunisal]  Itching             Itching,swelling           Home Medications:     Prior to Admission Medications     Prescriptions  Last Dose  Informant  Patient Reported?  Taking?      insulin NPH (HUMULIN N NPH INSULIN KWIKPEN) 100 unit/mL (3 mL) inpn  05/10/2017 at Unknown time    No  Yes      Sig: INJECT 32 UNITS SUBCUTANEOUSLY AT BEDTIME      lidocaine (LIDODERM) 5 %  Not Taking at Unknown time    Yes  No      Sig: 1 Patch by TransDERmal route every twelve (12) hours as needed. Apply patch to the affected area for 12 hours a day and remove for 12 hours a  day.      losartan (COZAAR) 50 mg tablet  05/10/2017 at Unknown time    Yes  Yes      Sig: Take 50 mg by mouth nightly.      metFORMIN ER (GLUCOPHAGE XR) 500 mg tablet  05/11/2017 at Unknown time    Yes  Yes      Sig: Take 1,000 mg by mouth two (2) times daily (with meals).      sertraline (ZOLOFT) 100 mg tablet  05/11/2017 at Unknown time    No  Yes      Sig: TAKE 1 TABLET BY MOUTH DAILY      simvastatin (ZOCOR) 40 mg tablet  05/10/2017 at Unknown time    No  Yes      Sig: TAKE 1 TABLET NIGHTLY      triamterene-hydroCHLOROthiazide (MAXZIDE) 37.5-25 mg per tablet  05/11/2017 at Unknown time    No  Yes      Sig: TAKE 1 TABLET DAILY               Facility-Administered Medications: None           Hospital Medications:      Current Facility-Administered Medications          Medication  Dose  Route  Frequency           ?  insulin NPH (NOVOLIN N, HUMULIN N) injection 25 Units   25 Units  SubCUTAneous  ACB&D     ?  methylPREDNISolone (PF) (SOLU-MEDROL) injection 80 mg   80 mg  IntraVENous  Q6H           ?  hydrOXYzine HCl (ATARAX) tablet 50 mg   50 mg  Oral  Q4H PRN           ?  prochlorperazine (COMPAZINE) with saline injection 5 mg   5 mg  IntraVENous  Q6H PRN     ?  0.9% sodium chloride infusion   75 mL/hr  IntraVENous  CONTINUOUS     ?  famotidine (PF) (PEPCID) 20 mg in sodium chloride 0.9% 10 mL injection   20 mg  IntraVENous  Q12H     ?  sertraline (ZOLOFT) tablet 100 mg   100 mg  Oral  DAILY     ?  triamterene-hydroCHLOROthiazide (MAXZIDE) 37.5-25 mg per tablet 1 Tab   1 Tab  Oral  DAILY     ?  sodium chloride (NS) flush 5-10 mL   5-10 mL  IntraVENous  Q8H     ?  sodium chloride (NS) flush 5-10 mL   5-10 mL  IntraVENous  PRN     ?  acetaminophen (TYLENOL) tablet 650 mg   650 mg  Oral  Q4H PRN     ?  insulin lispro (HUMALOG) injection     SubCUTAneous  TIDAC     ?  glucose chewable tablet 16 g   4 Tab  Oral  PRN     ?  dextrose (D50W) injection syrg 12.5-25 g   12.5-25 g  IntraVENous  PRN     ?  glucagon (GLUCAGEN) injection 1 mg   1 mg  IntraMUSCular  PRN           ?  ondansetron (ZOFRAN) injection 4 mg   4 mg  IntraVENous  Q6H PRN           Social History:     Social History       Substance Use Topics         ?  Smoking status:  Former Smoker              Packs/day:  1.00         Quit date:  06/27/2011         ?  Smokeless tobacco:  Never Used         ?  Alcohol use  Not on file           Family History:   No family history on file.      Review of Systems:   Constitutional: negative fever, +chills, negative weight loss   Eyes:   negative visual changes   ENT:   +sore throat (improving), +tongue or lip swelling   Respiratory:  +cough (improving), negative dyspnea   Cards:  negative for chest pain, palpitations, lower extremity  edema   GI:   See HPI   GU:  negative for frequency, dysuria   Integument:  ++for rash and pruritus   Heme:  negative for easy bruising and gum/nose bleeding   Musculoskeletal:negative for myalgias and muscle weakness; + back pain   Neuro:    negative for dizziness, vertigo; +headaches   Psych:  negative for feelings of anxiety, depression         Objective:     Patient Vitals for the past 8 hrs:           BP  Temp  Pulse  Resp     05/12/17 1124  101/64  98.4 ??F (36.9 ??C)  84  16     05/12/17 0941  (!) 89/72  98.5 ??F (36.9 ??C)  74  16            09/04 1901 - 09/06 0700   In: 120 [P.O.:120]   Out: -       EXAM:      CONST:  Pleasant lady lying in bed, no acute distress    NEURO:  alert and oriented x 3    HEENT: EOMI, no scleral icterus    LUNGS: clear to ausculation, (-) wheeze    CARD:  regular rate and rhythm, S1 S2    ABD:  soft, obese, epigastric tenderness, no rebound, bowel sounds (+)  all 4 quadrants, no masses, non distended    EXT:  Hives, warm    PSYCH: full, not anxious       Data Review         Recent Labs             05/12/17    0205   05/11/17    0738     WBC   9.1   12.3*     HGB   16.3*   15.4     HCT   51.9*   46.2         PLT   263   242          Recent Labs             05/12/17    0503   05/11/17    0738     NA   133*   135*     K   4.1   4.0     CL   96*   101     CO2   26   22     BUN   30*   17     CREA   1.31*   0.91     GLU   270*   178*         CA   7.5*   8.4*          Recent Labs             05/12/17    0503   05/11/17    0738     SGOT   19   19     AP   39*   59     TP   6.1*   7.1     ALB   2.6*   3.1*         GLOB   3.5   4.0          Recent Labs            05/11/17    0738     INR   1.0     PTP   10.3        APTT   26.0          Assessment:     ??    Epigastric pain/nausea/vomiting: symptoms began with onset of hives; regular NSAID use          Patient Active Problem List        Diagnosis  Code         ?  Hypercholesterolemia  E78.00     ?  HTN (hypertension)  I10     ?  Diabetes (HCC)   E11.9     ?  Sleep apnea  G47.30     ?  Hypertension complicating diabetes (HCC)  E11.59, I10     ?  Morbid obesity due to excess calories (HCC)  E66.01         ?  Hives  L50.9          Plan:          ??  Add PPI and Carafate    ??  Scheduled Zofran    ??  Consider EGD when recovers from reaction    ??  Thank you for allowing me to participate in care of Dana Seat           Signed By:  Lafayette Dragon,  NP        05/12/2017  4:29 PM        GI Attending: Patient seen and examined. She was on [facility] powder regularly until recently. For her allergic reaction, she was understandably started on prednisone. It is possible that she may have developed a peptic ulcer in the setting of NSAID's + prednisone.  Agree with CT as per primary team's plan. Will empirically treat with PPI and Carafate for now. Plan for EGD if symptoms do not improve and will await CT results. Dr. Dagmar Hait to follow tomorrow.      Sindee Stucker P. Tally Joe, MD

## 2017-05-13 LAB — METABOLIC PANEL, COMPREHENSIVE
A-G Ratio: 0.7 — ABNORMAL LOW (ref 1.1–2.2)
ALT (SGPT): 92 U/L — ABNORMAL HIGH (ref 12–78)
AST (SGOT): 98 U/L — ABNORMAL HIGH (ref 15–37)
Albumin: 2.6 g/dL — ABNORMAL LOW (ref 3.5–5.0)
Alk. phosphatase: 35 U/L — ABNORMAL LOW (ref 45–117)
Anion gap: 12 mmol/L (ref 5–15)
BUN/Creatinine ratio: 34 — ABNORMAL HIGH (ref 12–20)
BUN: 42 MG/DL — ABNORMAL HIGH (ref 6–20)
Bilirubin, total: 1.1 MG/DL — ABNORMAL HIGH (ref 0.2–1.0)
CO2: 27 mmol/L (ref 21–32)
Calcium: 6.9 MG/DL — ABNORMAL LOW (ref 8.5–10.1)
Chloride: 96 mmol/L — ABNORMAL LOW (ref 97–108)
Creatinine: 1.23 MG/DL — ABNORMAL HIGH (ref 0.55–1.02)
GFR est AA: 54 mL/min/{1.73_m2} — ABNORMAL LOW (ref >60)
GFR est non-AA: 45 mL/min/{1.73_m2} — ABNORMAL LOW (ref >60)
Globulin: 3.5 g/dL (ref 2.0–4.0)
Glucose: 207 mg/dL — ABNORMAL HIGH (ref 65–100)
Potassium: 3.6 mmol/L (ref 3.5–5.1)
Protein, total: 6.1 g/dL — ABNORMAL LOW (ref 6.4–8.2)
Sodium: 135 mmol/L — ABNORMAL LOW (ref 136–145)

## 2017-05-13 LAB — ECG RHYTHM ANALYSIS ADULT
Atrial Rate: 93 {beats}/min
Atrial Rate: 93 {beats}/min
Calculated P Axis: 22 degrees
Calculated R Axis: 17 degrees
Calculated T Axis: 42 degrees
Diagnosis: NORMAL
Diagnosis: NORMAL
P Axis: 22 degrees
P-R Interval: 126 ms
P-R Interval: 126 ms
Q-T Interval: 376 ms
Q-T Interval: 376 ms
QRS Duration: 80 ms
QRS Duration: 80 ms
QTC Calculation (Bezet): 467 ms
QTc Calculation (Bazett): 467 ms
R Axis: 17 degrees
T Axis: 42 degrees
Ventricular Rate: 93 {beats}/min
Ventricular Rate: 93 {beats}/min

## 2017-05-13 LAB — GLUCOSE, POC
Glucose (POC): 205 mg/dL — ABNORMAL HIGH (ref 65–100)
Glucose (POC): 208 mg/dL — ABNORMAL HIGH (ref 65–100)
Glucose (POC): 215 mg/dL — ABNORMAL HIGH (ref 65–100)
Glucose (POC): 200 mg/dL — ABNORMAL HIGH (ref 65–100)

## 2017-05-13 LAB — CBC WITH AUTOMATED DIFF
ABS. BASOPHILS: 0 10*3/uL (ref 0.0–0.1)
ABS. EOSINOPHILS: 0 10*3/uL (ref 0.0–0.4)
ABS. IMM. GRANS.: 0 10*3/uL
ABS. LYMPHOCYTES: 0.5 10*3/uL — ABNORMAL LOW (ref 0.8–3.5)
ABS. MONOCYTES: 0.3 10*3/uL (ref 0.0–1.0)
ABS. NEUTROPHILS: 5.1 10*3/uL (ref 1.8–8.0)
ABSOLUTE NRBC: 0 10*3/uL (ref 0.00–0.01)
BAND NEUTROPHILS: 19 % — ABNORMAL HIGH (ref 0–6)
BASOPHILS: 0 % (ref 0–1)
EOSINOPHILS: 0 % (ref 0–7)
HCT: 41.6 % (ref 35.0–47.0)
HGB: 13.6 g/dL (ref 11.5–16.0)
IMMATURE GRANULOCYTES: 0 %
LYMPHOCYTES: 9 % — ABNORMAL LOW (ref 12–49)
MCH: 30 PG (ref 26.0–34.0)
MCHC: 32.7 g/dL (ref 30.0–36.5)
MCV: 91.8 FL (ref 80.0–99.0)
MONOCYTES: 5 % (ref 5–13)
MPV: 10.6 FL (ref 8.9–12.9)
NEUTROPHILS: 67 % (ref 32–75)
NRBC: 0 PER 100 WBC
PLATELET: 230 10*3/uL (ref 150–400)
RBC: 4.53 M/uL (ref 3.80–5.20)
RDW: 14.5 % (ref 11.5–14.5)
WBC: 5.9 10*3/uL (ref 3.6–11.0)

## 2017-05-13 LAB — HEMOGLOBIN A1C WITH EAG
Est. average glucose: 163 mg/dL
Hemoglobin A1c: 7.3 % — ABNORMAL HIGH (ref 4.2–6.3)

## 2017-05-13 LAB — PHOSPHORUS: Phosphorus: 4.2 MG/DL (ref 2.6–4.7)

## 2017-05-13 LAB — MAGNESIUM: Magnesium: 1.6 mg/dL (ref 1.6–2.4)

## 2017-05-13 MED ORDER — LACTATED RINGERS IV
INTRAVENOUS | Status: DC
Start: 2017-05-13 — End: 2017-05-13
  Administered 2017-05-13: 08:00:00 via INTRAVENOUS

## 2017-05-13 MED ORDER — INSULIN LISPRO 100 UNIT/ML INJECTION
100 unit/mL | Freq: Four times a day (QID) | SUBCUTANEOUS | Status: DC
Start: 2017-05-13 — End: 2017-05-18
  Administered 2017-05-13 – 2017-05-18 (×18): via SUBCUTANEOUS

## 2017-05-13 MED ORDER — MIDAZOLAM 1 MG/ML IJ SOLN
1 mg/mL | INTRAMUSCULAR | Status: DC | PRN
Start: 2017-05-13 — End: 2017-05-13
  Administered 2017-05-13: 05:00:00 via INTRAVENOUS

## 2017-05-13 MED ORDER — SODIUM CHLORIDE 0.9 % IV
10 mg/mL | INTRAVENOUS | Status: DC | PRN
Start: 2017-05-13 — End: 2017-05-13
  Administered 2017-05-13: 04:00:00 via INTRAVENOUS

## 2017-05-13 MED ORDER — FENTANYL CITRATE (PF) 50 MCG/ML IJ SOLN
50 mcg/mL | INTRAMUSCULAR | Status: DC | PRN
Start: 2017-05-13 — End: 2017-05-13
  Administered 2017-05-13 (×4): via INTRAVENOUS

## 2017-05-13 MED ORDER — SODIUM CHLORIDE 0.9 % IJ SYRG
INTRAMUSCULAR | Status: DC | PRN
Start: 2017-05-13 — End: 2017-05-13

## 2017-05-13 MED ORDER — FENTANYL CITRATE (PF) 50 MCG/ML IJ SOLN
50 mcg/mL | INTRAMUSCULAR | Status: DC | PRN
Start: 2017-05-13 — End: 2017-05-14
  Administered 2017-05-13 – 2017-05-14 (×7): via INTRAVENOUS

## 2017-05-13 MED ORDER — MIDAZOLAM 1 MG/ML IJ SOLN
1 mg/mL | INTRAMUSCULAR | Status: AC
Start: 2017-05-13 — End: ?

## 2017-05-13 MED ORDER — SUCCINYLCHOLINE CHLORIDE 20 MG/ML INJECTION
20 mg/mL | INTRAMUSCULAR | Status: DC | PRN
Start: 2017-05-13 — End: 2017-05-13
  Administered 2017-05-13: 04:00:00 via INTRAVENOUS

## 2017-05-13 MED ORDER — LIDOCAINE (PF) 10 MG/ML (1 %) IJ SOLN
10 mg/mL (1 %) | INTRAMUSCULAR | Status: DC | PRN
Start: 2017-05-13 — End: 2017-05-13

## 2017-05-13 MED ORDER — FENTANYL CITRATE (PF) 50 MCG/ML IJ SOLN
50 mcg/mL | INTRAMUSCULAR | Status: DC | PRN
Start: 2017-05-13 — End: 2017-05-13

## 2017-05-13 MED ORDER — IPRATROPIUM-ALBUTEROL 2.5 MG-0.5 MG/3 ML NEB SOLUTION
2.5 mg-0.5 mg/3 ml | Freq: Four times a day (QID) | RESPIRATORY_TRACT | Status: DC
Start: 2017-05-13 — End: 2017-05-14
  Administered 2017-05-13 – 2017-05-14 (×3): via RESPIRATORY_TRACT

## 2017-05-13 MED ORDER — SODIUM CHLORIDE 0.9 % IJ SYRG
Freq: Three times a day (TID) | INTRAMUSCULAR | Status: DC
Start: 2017-05-13 — End: 2017-05-13
  Administered 2017-05-13: 10:00:00 via INTRAVENOUS

## 2017-05-13 MED ORDER — ONDANSETRON (PF) 4 MG/2 ML INJECTION
4 mg/2 mL | INTRAMUSCULAR | Status: DC | PRN
Start: 2017-05-13 — End: 2017-05-13
  Administered 2017-05-13: 06:00:00 via INTRAVENOUS

## 2017-05-13 MED ORDER — ROCURONIUM 10 MG/ML IV
10 mg/mL | INTRAVENOUS | Status: DC | PRN
Start: 2017-05-13 — End: 2017-05-13
  Administered 2017-05-13 (×4): via INTRAVENOUS

## 2017-05-13 MED ORDER — PIPERACILLIN-TAZOBACTAM 3.375 GRAM IV SOLR
3.375 gram | INTRAVENOUS | Status: AC
Start: 2017-05-13 — End: 2017-05-13
  Administered 2017-05-13: 05:00:00

## 2017-05-13 MED ORDER — CALCIUM GLUCONATE 100 MG/ML (10%) IV SOLN
10010 mg/mL (10%) | Freq: Once | INTRAVENOUS | Status: AC
Start: 2017-05-13 — End: 2017-05-14
  Administered 2017-05-13: 12:00:00 via INTRAVENOUS

## 2017-05-13 MED ORDER — FENTANYL CITRATE (PF) 50 MCG/ML IJ SOLN
50 mcg/mL | INTRAMUSCULAR | Status: AC
Start: 2017-05-13 — End: ?

## 2017-05-13 MED ORDER — HYDROMORPHONE (PF) 2 MG/ML IJ SOLN
2 mg/mL | INTRAMUSCULAR | Status: DC | PRN
Start: 2017-05-13 — End: 2017-05-13
  Administered 2017-05-13: 07:00:00 via INTRAVENOUS

## 2017-05-13 MED ORDER — PIPERACILLIN-TAZOBACTAM 3.375 GRAM IV SOLR
3.375 gram | Freq: Three times a day (TID) | INTRAVENOUS | Status: DC
Start: 2017-05-13 — End: 2017-05-18
  Administered 2017-05-13 – 2017-05-18 (×16): via INTRAVENOUS

## 2017-05-13 MED ORDER — SUGAMMADEX 100 MG/ML INTRAVENOUS SOLUTION
100 mg/mL | INTRAVENOUS | Status: DC | PRN
Start: 2017-05-13 — End: 2017-05-13
  Administered 2017-05-13: 07:00:00 via INTRAVENOUS

## 2017-05-13 MED ORDER — DIPHENHYDRAMINE HCL 50 MG/ML IJ SOLN
50 mg/mL | INTRAMUSCULAR | Status: DC | PRN
Start: 2017-05-13 — End: 2017-05-13
  Administered 2017-05-13: 15:00:00 via INTRAVENOUS

## 2017-05-13 MED ORDER — PHENYLEPHRINE IN 0.9 % SODIUM CL (40 MCG/ML) IV SYRINGE
0.4 mg/10 mL (40 mcg/mL) | INTRAVENOUS | Status: DC | PRN
Start: 2017-05-13 — End: 2017-05-13
  Administered 2017-05-13 (×2): via INTRAVENOUS

## 2017-05-13 MED ORDER — GLUCAGON 1 MG INJECTION
1 mg | INTRAMUSCULAR | Status: DC | PRN
Start: 2017-05-13 — End: 2017-05-18

## 2017-05-13 MED ORDER — SIMVASTATIN 40 MG TAB
40 mg | ORAL_TABLET | ORAL | 1 refills | Status: DC
Start: 2017-05-13 — End: 2017-07-17

## 2017-05-13 MED ORDER — ROPIVACAINE (PF) 5 MG/ML (0.5 %) INJECTION
5 mg/mL (0. %) | INTRAMUSCULAR | Status: DC | PRN
Start: 2017-05-13 — End: 2017-05-13

## 2017-05-13 MED ORDER — LIDOCAINE (PF) 20 MG/ML (2 %) IJ SOLN
20 mg/mL (2 %) | INTRAMUSCULAR | Status: DC | PRN
Start: 2017-05-13 — End: 2017-05-13
  Administered 2017-05-13: 04:00:00 via INTRAVENOUS

## 2017-05-13 MED ORDER — FENTANYL CITRATE (PF) 50 MCG/ML IJ SOLN
50 mcg/mL | INTRAMUSCULAR | Status: DC | PRN
Start: 2017-05-13 — End: 2017-05-13
  Administered 2017-05-13 (×4): via INTRAVENOUS

## 2017-05-13 MED ORDER — GLUCOSE 4 GRAM CHEWABLE TAB
4 gram | ORAL | Status: DC | PRN
Start: 2017-05-13 — End: 2017-05-13

## 2017-05-13 MED ORDER — MIDAZOLAM 1 MG/ML IJ SOLN
1 mg/mL | INTRAMUSCULAR | Status: DC | PRN
Start: 2017-05-13 — End: 2017-05-13

## 2017-05-13 MED ORDER — GLYCOPYRROLATE 0.2 MG/ML IJ SOLN
0.2 mg/mL | INTRAMUSCULAR | Status: DC | PRN
Start: 2017-05-13 — End: 2017-05-13
  Administered 2017-05-13: 07:00:00 via INTRAVENOUS

## 2017-05-13 MED ORDER — DEXTROSE 50% IN WATER (D50W) IV SYRG
INTRAVENOUS | Status: DC | PRN
Start: 2017-05-13 — End: 2017-05-18

## 2017-05-13 MED ORDER — DEXMEDETOMIDINE 400 MCG/100 ML (4 MCG/ML) IN 0.9 % SODIUM CHLORIDE IV
400 mcg/100 mL (4 mcg/mL) | INTRAVENOUS | Status: DC | PRN
Start: 2017-05-13 — End: 2017-05-13
  Administered 2017-05-13 (×3): via INTRAVENOUS

## 2017-05-13 MED ORDER — PROPOFOL 10 MG/ML IV EMUL
10 mg/mL | INTRAVENOUS | Status: DC | PRN
Start: 2017-05-13 — End: 2017-05-13
  Administered 2017-05-13: 04:00:00 via INTRAVENOUS

## 2017-05-13 MED ORDER — METHYLPREDNISOLONE (PF) 40 MG/ML IJ SOLR
40 mg/mL | Freq: Four times a day (QID) | INTRAMUSCULAR | Status: DC
Start: 2017-05-13 — End: 2017-05-16
  Administered 2017-05-14 – 2017-05-16 (×11): via INTRAVENOUS

## 2017-05-13 MED ORDER — MORPHINE 10 MG/ML IJ SOLN
10 mg/mL | INTRAMUSCULAR | Status: AC
Start: 2017-05-13 — End: 2017-05-13
  Administered 2017-05-13: 09:00:00

## 2017-05-13 MED ORDER — MORPHINE 2 MG/ML INJECTION
2 mg/mL | INTRAMUSCULAR | Status: AC | PRN
Start: 2017-05-13 — End: 2017-05-13
  Administered 2017-05-13 (×5): via INTRAVENOUS

## 2017-05-13 MED ORDER — LACTATED RINGERS IV
INTRAVENOUS | Status: DC
Start: 2017-05-13 — End: 2017-05-13
  Administered 2017-05-13 (×4): via INTRAVENOUS

## 2017-05-13 MED ORDER — ALBUMIN, HUMAN 5 % IV
5 % | INTRAVENOUS | Status: AC
Start: 2017-05-13 — End: ?

## 2017-05-13 MED ORDER — NEOSTIGMINE METHYLSULFATE 1 MG/ML INJECTION
1 mg/mL | INTRAMUSCULAR | Status: DC | PRN
Start: 2017-05-13 — End: 2017-05-13
  Administered 2017-05-13: 07:00:00 via INTRAVENOUS

## 2017-05-13 MED ORDER — NS WITH POTASSIUM CHLORIDE 20 MEQ/L IV
20 mEq/L | INTRAVENOUS | Status: DC
Start: 2017-05-13 — End: 2017-05-17
  Administered 2017-05-13 – 2017-05-16 (×6): via INTRAVENOUS

## 2017-05-13 MED ORDER — ALBUMIN, HUMAN 5 % IV
5 % | INTRAVENOUS | Status: DC | PRN
Start: 2017-05-13 — End: 2017-05-13
  Administered 2017-05-13: 05:00:00 via INTRAVENOUS

## 2017-05-13 MED ORDER — HYDROMORPHONE 2 MG/ML INJECTION SOLUTION
2 mg/mL | INTRAMUSCULAR | Status: AC
Start: 2017-05-13 — End: ?

## 2017-05-13 MED FILL — FENTANYL CITRATE (PF) 50 MCG/ML IJ SOLN: 50 mcg/mL | INTRAMUSCULAR | Qty: 2

## 2017-05-13 MED FILL — PHENYLEPHRINE 10 MG/ML INJECTION: 10 mg/mL | INTRAMUSCULAR | Qty: 1

## 2017-05-13 MED FILL — INSULIN LISPRO 100 UNIT/ML INJECTION: 100 unit/mL | SUBCUTANEOUS | Qty: 1

## 2017-05-13 MED FILL — LACTATED RINGERS IV: INTRAVENOUS | Qty: 1000

## 2017-05-13 MED FILL — ALBUTEIN 5 % INTRAVENOUS SOLUTION: 5 % | INTRAVENOUS | Qty: 500

## 2017-05-13 MED FILL — ALBUTEIN 5 % INTRAVENOUS SOLUTION: 5 % | INTRAVENOUS | Qty: 250

## 2017-05-13 MED FILL — SOLU-MEDROL (PF) 40 MG/ML SOLUTION FOR INJECTION: 40 mg/mL | INTRAMUSCULAR | Qty: 2

## 2017-05-13 MED FILL — BRIDION 100 MG/ML INTRAVENOUS SOLUTION: 100 mg/mL | INTRAVENOUS | Qty: 2

## 2017-05-13 MED FILL — PROTONIX 40 MG INTRAVENOUS SOLUTION: 40 mg | INTRAVENOUS | Qty: 40

## 2017-05-13 MED FILL — DIPRIVAN 10 MG/ML INTRAVENOUS EMULSION: 10 mg/mL | INTRAVENOUS | Qty: 10

## 2017-05-13 MED FILL — PRECEDEX 100 MCG/ML INTRAVENOUS SOLUTION: 100 mcg/mL | INTRAVENOUS | Qty: 40

## 2017-05-13 MED FILL — NS WITH POTASSIUM CHLORIDE 20 MEQ/L IV: 20 mEq/L | INTRAVENOUS | Qty: 1000

## 2017-05-13 MED FILL — NEOSTIGMINE METHYLSULFATE 5 MG/5 ML (1 MG/ML) IV SYRINGE: 5 mg/ mL (1 mg/mL) | INTRAVENOUS | Qty: 4

## 2017-05-13 MED FILL — GLYCOPYRROLATE 0.6 MG/3 ML (0.2 MG/ML) INTRAVENOUS SYRINGE: 0.6 mg/3 mL (0.2 mg/mL) | INTRAVENOUS | Qty: 3

## 2017-05-13 MED FILL — MORPHINE 4 MG/ML SYRINGE: 4 mg/mL | INTRAMUSCULAR | Qty: 1

## 2017-05-13 MED FILL — XYLOCAINE-MPF 20 MG/ML (2 %) INJECTION SOLUTION: 20 mg/mL (2 %) | INTRAMUSCULAR | Qty: 5

## 2017-05-13 MED FILL — PIPERACILLIN-TAZOBACTAM 3.375 GRAM IV SOLR: 3.375 gram | INTRAVENOUS | Qty: 3.38

## 2017-05-13 MED FILL — DIPHENHYDRAMINE HCL 50 MG/ML IJ SOLN: 50 mg/mL | INTRAMUSCULAR | Qty: 1

## 2017-05-13 MED FILL — QUELICIN 20 MG/ML INJECTION SOLUTION: 20 mg/mL | INTRAMUSCULAR | Qty: 10

## 2017-05-13 MED FILL — PHENYLEPHRINE IN 0.9 % SODIUM CL (40 MCG/ML) IV SYRINGE: 0.4 mg/10 mL (40 mcg/mL) | INTRAVENOUS | Qty: 240

## 2017-05-13 MED FILL — MORPHINE 10 MG/ML IJ SOLN: 10 mg/mL | INTRAMUSCULAR | Qty: 1

## 2017-05-13 MED FILL — IPRATROPIUM-ALBUTEROL 2.5 MG-0.5 MG/3 ML NEB SOLUTION: 2.5 mg-0.5 mg/3 ml | RESPIRATORY_TRACT | Qty: 3

## 2017-05-13 MED FILL — NORMAL SALINE FLUSH 0.9 % INJECTION SYRINGE: INTRAMUSCULAR | Qty: 10

## 2017-05-13 MED FILL — FAMOTIDINE (PF) 20 MG/2 ML IV: 20 mg/2 mL | INTRAVENOUS | Qty: 2

## 2017-05-13 MED FILL — ONDANSETRON (PF) 4 MG/2 ML INJECTION: 4 mg/2 mL | INTRAMUSCULAR | Qty: 2

## 2017-05-13 MED FILL — ROCURONIUM 10 MG/ML IV: 10 mg/mL | INTRAVENOUS | Qty: 7

## 2017-05-13 MED FILL — SOLU-MEDROL (PF) 40 MG/ML SOLUTION FOR INJECTION: 40 mg/mL | INTRAMUSCULAR | Qty: 1

## 2017-05-13 MED FILL — CALCIUM GLUCONATE 100 MG/ML (10%) IV SOLN: 100 mg/mL (10%) | INTRAVENOUS | Qty: 10

## 2017-05-13 MED FILL — HYDROMORPHONE 2 MG/ML INJECTION SOLUTION: 2 mg/mL | INTRAMUSCULAR | Qty: 1

## 2017-05-13 MED FILL — MIDAZOLAM 1 MG/ML IJ SOLN: 1 mg/mL | INTRAMUSCULAR | Qty: 2

## 2017-05-13 NOTE — Other (Addendum)
DTC Consult Note    Recommendations/ Comments:  Hyperglycemia, likely due to steroids, noted NPH held for NPO status    If appropriate, please consider:  - Adding NPH 15 units BID while NPO  Once PO intake is resumed, increase to 25 units BID (while pt on current dose of steroids)    Current hospital DM medication: Humalog for correction, insulin resistant scale   Also on solumedrol 80 mg very 6 hours   Consult received for:  [x]              Assessment of home management                  Chart reviewed and initial evaluation complete on Dana [name].    Patient is a 61 y.o. female with hx Type 2 Diabetes on NPH 32 units HS and Metformin 1000 mg BID at home.    Pt does not check blood sugars at home.  Reports that when she was checking she would check fasting and before lunch and her bg were always 125 mg/dl. Pt encouraged to check twice a day and stressed importance of checking.  Pt denies consumption of sweet beverages.      Assessed and instructed patient on the following:   ??  interpretation of lab results, blood sugar goals, complications of diabetes mellitus, hypoglycemia prevention and treatment, SMBG skills and self-injection of insulin  ??   Pt will need reinforcement as pt was seen after surgery and was sleepy.     Encouraged the following:   ?? dietary modifications: avoid concentrated sweets, regular blood sugar monitoring: 2 times daily, follow up with PCP    Provided patient with the following: [x]              Survival skills education materials               [x]              Insulin education materials               []              CHO counting education materials               [x]              Outpatient DTC contact number               [x]               Glucose post op guidelines    Discussed with patient and/or family need for follow up appointment for diabetes management after discharge.      A1c:   Lab Results   Component Value Date/Time    Hemoglobin A1c 7.3 (H) 05/13/2017 03:04 AM        Recent Glucose Results:   Lab Results   Component Value Date/Time    GLU 207 (H) 05/13/2017 03:04 AM    GLUCPOC 200 (H) 05/13/2017 11:21 AM    GLUCPOC 215 (H) 05/13/2017 05:56 AM    GLUCPOC 208 (H) 05/13/2017 03:04 AM        Lab Results   Component Value Date/Time    Creatinine 1.23 (H) 05/13/2017 03:04 AM     Estimated Creatinine Clearance: 63.8 mL/min (based on Cr of 1.23).    Active Orders   Diet    DIET NPO        PO intake: No data found.      Will continue  to follow as needed.    Thank you.  Burlene Arnt, RD, CDE  [facility]  Pager: [phone]

## 2017-05-13 NOTE — Progress Notes (Signed)
Medical Progress Note      NAME: Dana [name]   DOB:  [birth year]  MRM:  [id number]    Date/Time: 05/13/2017  10:40 AM         Problem List:     Active Problems:    Hypercholesterolemia ()      HTN (hypertension) ()      Diabetes (HCC) (01/24/2013)      Hypertension complicating diabetes (HCC) (05/25/2016)      Morbid obesity due to excess calories (HCC) (05/25/2016)      Hives (05/11/2017)      Acute gastric ulcer with perforation (HCC) (05/13/2017)             Subjective:     Patient awake, extubated, c/o abd pain    Past Medical History:   Diagnosis Date   ??? Diabetes (HCC) 01/24/2013   ??? GERD (gastroesophageal reflux disease)    ??? HTN (hypertension)    ??? Hypercholesterolemia        ROS:  General: negative for fever, chills, sweats, weakness  Respiratory:  negative for cough, sputum production, SOB, wheezing, DOE, pleuritic pain  Cardiology:  negative for chest pain, palpitations, orthopnea, PND, edema, syncope   Gastrointestinal: positive for abd pain         Objective:       Vitals:          Last 24hrs VS reviewed since prior progress note. Most recent are:    Visit Vitals   ??? BP (!) 110/94   ??? Pulse 91   ??? Temp 98.3 ??F (36.8 ??C)   ??? Resp 19   ??? Ht  (1.702 m)   ??? Wt 248 lb 0.3 oz (112.5 kg)   ??? SpO2 97%   ??? BMI 38.85 kg/m2     SpO2 Readings from Last 6 Encounters:   05/13/17 97%   05/20/15 94%   11/30/10 100%    O2 Flow Rate (L/min): 2 l/min     Intake/Output Summary (Last 24 hours) at 05/13/17 1040  Last data filed at 05/13/17 1000   Gross per 24 hour   Intake             2360 ml   Output             1265 ml   Net             1095 ml          Exam:     General   Obese 61 yo wf  Respiratory   Clear To Auscultation bilaterally - no wheezes, rales, rhonchi, or crackles  Cardiology  Sl tachy  Abdominal  Soft, tender,  Extremities  Decreased ue edema    Lab Data Reviewed: (see below)    Medications Reviewed: (see below)    ______________________________________________________________________    Medications:      Current Facility-Administered Medications   Medication Dose Route Frequency   ??? piperacillin-tazobactam (ZOSYN) 3.375 gram injection       ??? fentaNYL citrate (PF) injection 25-50 mcg  25-50 mcg IntraVENous Q3H PRN   ??? dextrose (D50W) injection syrg 12.5-25 g  25-50 mL IntraVENous PRN   ??? glucagon (GLUCAGEN) injection 1 mg  1 mg IntraMUSCular PRN   ??? insulin lispro (HUMALOG) injection   SubCUTAneous Q6H   ??? sodium chloride (NS) flush 5-10 mL  5-10 mL IntraVENous Q8H   ??? sodium chloride (NS) flush 5-10 mL  5-10 mL IntraVENous PRN   ??? lidocaine (  PF) (XYLOCAINE) 10 mg/mL (1 %) injection 0.1 mL  0.1 mL SubCUTAneous PRN   ??? fentaNYL citrate (PF) injection 50 mcg  50 mcg IntraVENous PRN   ??? midazolam (VERSED) injection 1 mg  1 mg IntraVENous PRN   ??? midazolam (VERSED) injection 1 mg  1 mg IntraVENous PRN   ??? ropivacaine (PF) (NAROPIN) 5 mg/mL (0.5 %) injection 150 mg  150 mg Peripheral Nerve Block PRN   ??? sodium chloride (NS) flush 5-10 mL  5-10 mL IntraVENous PRN   ??? fentaNYL citrate (PF) injection 25 mcg  25 mcg IntraVENous Multiple   ??? midazolam (VERSED) injection 0.5 mg  0.5 mg IntraVENous Q5MIN PRN   ??? diphenhydrAMINE (BENADRYL) injection 12.5 mg  12.5 mg IntraVENous PRN   ??? morphine 10 mg/mL injection       ??? 0.9% sodium chloride with KCl 20 mEq/L infusion   IntraVENous CONTINUOUS   ??? methylPREDNISolone (PF) (SOLU-MEDROL) injection 80 mg  80 mg IntraVENous Q6H   ??? prochlorperazine (COMPAZINE) with saline injection 5 mg  5 mg IntraVENous Q6H PRN   ??? famotidine (PF) (PEPCID) 20 mg in sodium chloride 0.9% 10 mL injection  20 mg IntraVENous Q12H   ??? ondansetron (ZOFRAN) injection 4 mg  4 mg IntraVENous Q6H   ??? sucralfate (CARAFATE) 100 mg/mL oral suspension 1 g  1 g Oral AC&HS   ??? pantoprazole (PROTONIX) 40 mg in sodium chloride 0.9% 10 mL injection  40 mg IntraVENous Q12H   ??? morphine injection 2 mg  2 mg IntraVENous Q1H PRN   ??? piperacillin-tazobactam (ZOSYN) 3.375 g in 0.9% sodium chloride  (MBP/ADV) 100 mL  3.375 g IntraVENous Q8H   ??? sodium chloride (NS) flush 5-10 mL  5-10 mL IntraVENous Q8H   ??? sodium chloride (NS) flush 5-10 mL  5-10 mL IntraVENous PRN   ??? glucose chewable tablet 16 g  4 Tab Oral PRN   ??? dextrose (D50W) injection syrg 12.5-25 g  12.5-25 g IntraVENous PRN   ??? glucagon (GLUCAGEN) injection 1 mg  1 mg IntraMUSCular PRN            Lab Review:     Recent Labs      05/13/17   0304  05/12/17   0205  05/11/17   0738   WBC  5.9  9.1  12.3*   HGB  13.6  16.3*  15.4   HCT  41.6  51.9*  46.2   PLT  230  263  242     Recent Labs      05/13/17   0304  05/12/17   0503  05/11/17   0738   NA  135*  133*  135*   K  3.6  4.1  4.0   CL  96*  96*  101   CO2  27  26  22    GLU  207*  270*  178*   BUN  42*  30*  17   CREA  1.23*  1.31*  0.91   CA  6.9*  7.5*  8.4*   MG  1.6   --    --    PHOS  4.2   --    --    ALB  2.6*  2.6*  3.1*   TBILI  1.1*  0.9  0.6   SGOT  98*  19  19   ALT  92*  16  19   INR   --    --   1.0  Lab Results   Component Value Date/Time    Glucose (POC) 215 (H) 05/13/2017 05:56 AM    Glucose (POC) 208 (H) 05/13/2017 03:04 AM    Glucose (POC) 205 (H) 05/13/2017 12:06 AM    Glucose (POC) 200 (H) 05/12/2017 04:29 PM    Glucose (POC) 254 (H) 05/12/2017 11:55 AM     No results for input(s): PH, PCO2, PO2, HCO3, FIO2 in the last 72 hours.  Recent Labs      05/11/17   0738   INR  1.0       Other pertinent lab: NA         Assessment:     Patient Active Problem List   Diagnosis Code   ??? Hypercholesterolemia E78.00   ??? HTN (hypertension) I10   ??? Diabetes (HCC) E11.9   ??? Sleep apnea G47.30   ??? Hypertension complicating diabetes (HCC) E11.59, I10   ??? Morbid obesity due to excess calories (HCC) E66.01   ??? Hives L50.9   ??? Acute gastric ulcer with perforation (HCC) K25.1          Plan:                 1. Hives- improving  2. perf gastric ulcer- on ppi, s/p emergent surgery  ?  Abx?  3. Diabetes- ssi, restart nph when eating                ___________________________________________________     Attending Physician: Lake Bells, MD

## 2017-05-13 NOTE — Anesthesia Post-Procedure Evaluation (Signed)
Post-Anesthesia Evaluation and Assessment    Patient: Dana [name] MRN: [id number]  SSN: [id number]    Date of Birth: 1956/06/28  Age: 61 y.o.  Sex: female       Cardiovascular Function/Vital Signs  Visit Vitals   ??? BP 98/57   ??? Pulse (!) 101   ??? Temp 37.2 ??C (98.9 ??F)   ??? Resp 20   ??? Ht 5\' 7"  (1.702 m)   ??? Wt 112.5 kg (248 lb 0.3 oz)   ??? SpO2 97%   ??? BMI 38.85 kg/m2       Patient is status post general anesthesia for Procedure(s):  LAPAROTOMY EXPLORATORY PERFORATED Posterior ULCER REPAIR over sew with patch.    Nausea/Vomiting: None    Postoperative hydration reviewed and adequate.    Pain:  Pain Scale 1: Numeric (0 - 10) (05/12/17 2140)  Pain Intensity 1: 10 (05/12/17 2140)   Managed    Neurological Status:       At baseline    Mental Status and Level of Consciousness: Arousable    Pulmonary Status:   O2 Device: Nasal cannula (05/13/17 0258)   Adequate oxygenation and airway patent    Complications related to anesthesia: None    Post-anesthesia assessment completed. No concerns    Signed By: Tamela OddiFrank J Greenlee Ancheta, MD     May 13, 2017

## 2017-05-13 NOTE — Progress Notes (Signed)
Progress Note    Patient: Dana [name] MRN: [id number]  SSN: [id number]    Date of Birth: 02-06-1956  Age: 61 y.o.  Sex: female      Admit Date: 05/11/2017    1 Day Post-Op    Procedure:  Procedure(s):  LAPAROTOMY EXPLORATORY PERFORATED Posterior ULCER REPAIR WITH GRAHAM PATCH    Subjective:     Patient complains of back pain and dry mouth.     Objective:     Visit Vitals   ??? BP 106/65   ??? Pulse 93   ??? Temp 98.3 ??F (36.8 ??C)   ??? Resp 18   ??? Ht 5' 7"  (1.702 m)   ??? Wt 248 lb 0.3 oz (112.5 kg)   ??? SpO2 97%   ??? BMI 38.85 kg/m2       Temp (24hrs), Avg:98.5 ??F (36.9 ??C), Min:98.1 ??F (36.7 ??C), Max:99.1 ??F (37.3 ??C)      Physical Exam:    ABDOMEN: Obese, non-distended, soft. Dressing dry and intact. Sero-sanguinous drain fluid. Appropriate incisional pain with palpation.    Data Review: VS, I/O's    Lab Review:   Recent Results (from the past 12 hour(s))   GLUCOSE, POC    Collection Time: 05/13/17 12:06 AM   Result Value Ref Range    Glucose (POC) 205 (H) 65 - 100 mg/dL    Performed by Randalyn Rhea    CBC WITH AUTOMATED DIFF    Collection Time: 05/13/17  3:04 AM   Result Value Ref Range    WBC 5.9 3.6 - 11.0 K/uL    RBC 4.53 3.80 - 5.20 M/uL    HGB 13.6 11.5 - 16.0 g/dL    HCT 41.6 35.0 - 47.0 %    MCV 91.8 80.0 - 99.0 FL    MCH 30.0 26.0 - 34.0 PG    MCHC 32.7 30.0 - 36.5 g/dL    RDW 14.5 11.5 - 14.5 %    PLATELET 230 150 - 400 K/uL    MPV 10.6 8.9 - 12.9 FL    NRBC 0.0 0 PER 100 WBC    ABSOLUTE NRBC 0.00 0.00 - 0.01 K/uL    NEUTROPHILS 67 32 - 75 %    BAND NEUTROPHILS 19 (H) 0 - 6 %    LYMPHOCYTES 9 (L) 12 - 49 %    MONOCYTES 5 5 - 13 %    EOSINOPHILS 0 0 - 7 %    BASOPHILS 0 0 - 1 %    IMMATURE GRANULOCYTES 0 %    ABS. NEUTROPHILS 5.1 1.8 - 8.0 K/UL    ABS. LYMPHOCYTES 0.5 (L) 0.8 - 3.5 K/UL    ABS. MONOCYTES 0.3 0.0 - 1.0 K/UL    ABS. EOSINOPHILS 0.0 0.0 - 0.4 K/UL    ABS. BASOPHILS 0.0 0.0 - 0.1 K/UL    ABS. IMM. GRANS. 0.0 K/UL    DF MANUAL      PLATELET COMMENTS Large Platelets      RBC COMMENTS ANISOCYTOSIS  1+         RBC COMMENTS BURR CELLS  PRESENT        RBC COMMENTS POLYCHROMASIA  1+       MAGNESIUM    Collection Time: 05/13/17  3:04 AM   Result Value Ref Range    Magnesium 1.6 1.6 - 2.4 mg/dL   PHOSPHORUS    Collection Time: 05/13/17  3:04 AM   Result Value Ref Range    Phosphorus 4.2 2.6 -  4.7 MG/DL   METABOLIC PANEL, COMPREHENSIVE    Collection Time: 06-05-2017  3:04 AM   Result Value Ref Range    Sodium 135 (L) 136 - 145 mmol/L    Potassium 3.6 3.5 - 5.1 mmol/L    Chloride 96 (L) 97 - 108 mmol/L    CO2 27 21 - 32 mmol/L    Anion gap 12 5 - 15 mmol/L    Glucose 207 (H) 65 - 100 mg/dL    BUN 42 (H) 6 - 20 MG/DL    Creatinine 1.23 (H) 0.55 - 1.02 MG/DL    BUN/Creatinine ratio 34 (H) 12 - 20      GFR est AA 54 (L) >60 ml/min/1.42m    GFR est non-AA 45 (L) >60 ml/min/1.739m   Calcium 6.9 (L) 8.5 - 10.1 MG/DL    Bilirubin, total 1.1 (H) 0.2 - 1.0 MG/DL    ALT (SGPT) 92 (H) 12 - 78 U/L    AST (SGOT) 98 (H) 15 - 37 U/L    Alk. phosphatase 35 (L) 45 - 117 U/L    Protein, total 6.1 (L) 6.4 - 8.2 g/dL    Albumin 2.6 (L) 3.5 - 5.0 g/dL    Globulin 3.5 2.0 - 4.0 g/dL    A-G Ratio 0.7 (L) 1.1 - 2.2     HEMOGLOBIN A1C WITH EAG    Collection Time: 0909/30/183:04 AM   Result Value Ref Range    Hemoglobin A1c 7.3 (H) 4.2 - 6.3 %    Est. average glucose 163 mg/dL   GLUCOSE, POC    Collection Time: 09Sep 30, 20183:04 AM   Result Value Ref Range    Glucose (POC) 208 (H) 65 - 100 mg/dL    Performed by [facility]    Collection Time: 09September 30, 20185:56 AM   Result Value Ref Range    Glucose (POC) 215 (H) 65 - 100 mg/dL    Performed by JeRandalyn Rhea        Assessment:     Hospital Problems  Date Reviewed: 05/2017/09/30        Codes Class Noted POA    Acute gastric ulcer with perforation (HCFort WrightICD-10-CM: K25.1  ICD-9-CM: 531.10  9/September 30, 2018o        Hives ICD-10-CM: L50.9  ICD-9-CM: 708.9  05/11/2017 Unknown        Hypertension complicating diabetes (HCLivermoreICD-10-CM: E11.59, I10  ICD-9-CM: 250.80, 401.9  05/25/2016 Yes         Morbid obesity due to excess calories (HCSabana SecaICD-10-CM: E66.01  ICD-9-CM: 278.01  05/25/2016 Yes        Diabetes (HCFarmerICD-10-CM: E11.9  ICD-9-CM: 250.00  01/24/2013 Yes        Hypercholesterolemia ICD-10-CM: E78.00  ICD-9-CM: 272.0  Unknown Yes        HTN (hypertension) ICD-10-CM: I10  ICD-9-CM: 401.9  Unknown Yes              Plan/Recommendations/Medical Decision Making:     1. NPO except ice chips.  2. Antibiotics, drain, PPI.  3. Change IVF to NS.  4. Ambulation, PT consult.  5. UGI on Monday.    Signed By: BrAl CorpusMD     Se09/30/18

## 2017-05-13 NOTE — Op Note (Signed)
[facility]  OPERATIVE REPORT    Name:[name], [name]  MR#: [id number]  DOB: Oct 02, 1955  ACCOUNT #: [id number]   DATE OF SERVICE: 05/13/2017    SURGEON:  Hoy FinlayStuart Peighton Edgin, MD    ASSISTANT:  Dolak    ANESTHESIA:  General    PREOPERATIVE DIAGNOSIS:  Perforated gastric ulcer.    POSTOPERATIVE DIAGNOSIS:  Perforated posterior prepyloric ulcer.    PROCEDURES PERFORMED:  Exploratory laparotomy, repair of perforated posterior ulcer with oversew and onlay omental patch.    ESTIMATED BLOOD LOSS:  50      SPECIMENS REMOVED:  None non     COMPLICATIONS:  none    IMPLANTS:  None      CLINICAL INDICATION:  The patient is a 61 year old female who is in the hospital being treated for an urticarial rash with high-dose steroids.  The patient developed nausea and vomiting and severe abdominal pain.  CT scan was performed, which showed a perforated ulcer.  She, therefore, comes to the operating room today for urgent repair of her perforated ulcer.  Informed consent was obtained.      DESCRIPTION OF PROCEDURE:  The patient was brought to the operating room and placed on the operative table in supine position.  General endotracheal anesthesia was then established.  She was then prepped and draped in usual sterile fashion.  A midline incision was then made down to the level of subcutaneous tissue.  Bovie electrocautery was used to come through the subcutaneous tissues and onto the fascia.  The fascia was opened using cautery.  The peritoneum was then entered bluntly.  Once we had opened up the peritoneum, a Bookwalter retractor was then placed.  The area of the antrum and prepylorus were identified.  There appeared to be some leakage and some hematoma-type material in the lesser sac.  We first worked superiorly opening up the peritoneum and found some hematoma; however, no ulcer could be identified.  We then, therefore, went below the stomach and opened up the lesser sac using the LigaSure.  We were on top  of the pancreas but had not damaged the pancreas.  The hematoma was then removed.  We then initially could not find the ulcer.  An NG tube was passed.  Fluid was placed within the abdomen, and using air put into the NG tube, we were able to finally identify the area of the ulcer.  This appeared to be posterior, superior and prepyloric.  We then freed up this such that we could flip it over in order to affect our repair.  The ulcer was then closed in 2 layers, the first layer being full-thickness 3-0 Vicryl sutures interrupted and a second layer oversewing this with 2-0 Vicryl sutures imbricating the suture line.  We then created a tongue of omentum using the LigaSure.  This was then placed over the repair and secured into place using 2-0 Vicryl sutures, thus affecting repair.  Leak test was performed by once again putting air into the NG tube after fluid was placed, and no leak was identified.  A stab incision was made in the left heavy abdomen, and a 19-French JP drain was introduced and placed over our repair.  This was secured in place using 2-0 nylon suture.  The abdomen was then copiously irrigated.  The fascia was closed using #1 PDS, 1 from above, 1 from below, and the skin was then closed using staples.  Dressings were applied.  The patient was awoken in the OR  and taken to PACU in stable condition.      Hoy Finlay, MD       SS / HN  D: 05/13/2017 02:46     T: 05/13/2017 07:29  JOB #: [id number]

## 2017-05-13 NOTE — Progress Notes (Signed)
TRANSFER - IN REPORT:    Verbal report received from Sarah, RN(name) on Dana [name]  [facility]      Report consisted of patient???s Situation, Background, Assessment and   Recommendations(SBAR).     Information from the following report(s) SBAR, OR Summary, Procedure Summary, Accordion and Cardiac Rhythm NSR PVC was reviewed with the receiving nurse.    Opportunity for questions and clarification was provided.      Assessment completed upon patient???s arrival to unit and care assumed.

## 2017-05-13 NOTE — Other (Signed)
Letter of Status Determination:   Recommend hospitalization status upgraded from   OBSERVATION  to INPATIENT  Status     Pt Name:  Dana [name]   MR#   HAR # [id number] /  [id number]  Payor: BLUE CROSS / Plan: [place] P[place] / Product Type: PPO /    CSN#  [id number]   [facility]   Hospitalization date  05/11/2017  7:18 AM   Current Attending Physician  Lake BellsNancy J Pahle, MD   Principal diagnosis  <principal problem not specified>   Hives   Clinicals  61 y.o. y.o  female hospitalized with above diagnosis   The pt was found to have been using OTC NSAID(BC powder PTA).  Workup led to diagnosis of perforated peptic ulcer disease that is now s/p repair.   She is recovering slowly      [place] (MCG) criteria   Does   apply    STATUS DETERMINATION  Based on documented presenting clinical data, comorbid conditions, high risk of adverse events and deterioration, it is our recommendation that the patient's status should be upgraded from OBSERVATION to INPATIENT status.      The final decision of the patient's hospitalization status depends on the attending physician's judgment.          Additional comments     Payor: BLUE CROSS / Plan: [place] PPO / Product Type: PPO /         Gabriel CarinaHasan M. Daishawn Lauf MD MPH FACP   Cell: [phone]  Physician Advisor    [facility]   Care Management & Compliant Documentation Management Program  [facility]  [facility]    [facility]   President Medical Staff,  [facility]    Cell  [phone]        [phone]    .

## 2017-05-13 NOTE — Anesthesia Pre-Procedure Evaluation (Signed)
Anesthetic History   No history of anesthetic complications            Review of Systems / Medical History  Patient summary reviewed, nursing notes reviewed and pertinent labs reviewed    Pulmonary        Sleep apnea           Neuro/Psych   Within defined limits           Cardiovascular    Hypertension                   GI/Hepatic/Renal     GERD           Endo/Other    Diabetes    Morbid obesity     Other Findings              Physical Exam    Airway  Mallampati: II  TM Distance: > 6 cm  Neck ROM: normal range of motion   Mouth opening: Normal     Cardiovascular  Regular rate and rhythm,  S1 and S2 normal,  no murmur, click, rub, or gallop             Dental  No notable dental hx       Pulmonary  Breath sounds clear to auscultation               Abdominal  GI exam deferred       Other Findings            Anesthetic Plan    ASA: 3, emergent  Anesthesia type: general          Induction: Intravenous  Anesthetic plan and risks discussed with: Patient

## 2017-05-13 NOTE — Progress Notes (Addendum)
[facility] [place] [address]       GI PROGRESS NOTE  Octavia Heir, [place] office  [phone] NP in-hospital cell phone M-F until 4:30  After 5pm or on weekends, please call operator for physician on call      NAME: Dana [name]   DOB:  28-Nov-1955   MRN:  [id number]       Subjective:   Patient denies complaint     Objective:     VITALS:   Last 24hrs VS reviewed since prior progress note. Most recent are:  Visit Vitals   ??? BP 138/71   ??? Pulse 96   ??? Temp 97.9 ??F (36.6 ??C)   ??? Resp 20   ??? Ht 5' 7"  (1.702 m)   ??? Wt 112.5 kg (248 lb 0.3 oz)   ??? SpO2 91%   ??? BMI 38.85 kg/m2       PHYSICAL EXAM:  General: Cooperative, no acute distress????  Neurologic:?? Alert and oriented X 3.  HEENT: EOMI, no scleral icterus   Lungs:  CTA bilaterally. No wheezing  Heart:  S1 S2, regular rhythm,??no murmur   Abdomen: Soft, non-distended, mild incisional tenderness. Midline dressing CDI  Extremities: No edema  Psych:???? Fair insight.??Not anxious or agitated.    Lab Data Reviewed:     Recent Results (from the past 24 hour(s))   GLUCOSE, POC    Collection Time: 05/13/17 12:06 AM   Result Value Ref Range    Glucose (POC) 205 (H) 65 - 100 mg/dL    Performed by Randalyn Rhea    CBC WITH AUTOMATED DIFF    Collection Time: 05/13/17  3:04 AM   Result Value Ref Range    WBC 5.9 3.6 - 11.0 K/uL    RBC 4.53 3.80 - 5.20 M/uL    HGB 13.6 11.5 - 16.0 g/dL    HCT 41.6 35.0 - 47.0 %    MCV 91.8 80.0 - 99.0 FL    MCH 30.0 26.0 - 34.0 PG    MCHC 32.7 30.0 - 36.5 g/dL    RDW 14.5 11.5 - 14.5 %    PLATELET 230 150 - 400 K/uL    MPV 10.6 8.9 - 12.9 FL    NRBC 0.0 0 PER 100 WBC    ABSOLUTE NRBC 0.00 0.00 - 0.01 K/uL    NEUTROPHILS 67 32 - 75 %    BAND NEUTROPHILS 19 (H) 0 - 6 %    LYMPHOCYTES 9 (L) 12 - 49 %    MONOCYTES 5 5 - 13 %    EOSINOPHILS 0 0 - 7 %    BASOPHILS 0 0 - 1 %    IMMATURE GRANULOCYTES 0 %    ABS. NEUTROPHILS 5.1 1.8 - 8.0 K/UL    ABS. LYMPHOCYTES 0.5 (L) 0.8 - 3.5 K/UL     ABS. MONOCYTES 0.3 0.0 - 1.0 K/UL    ABS. EOSINOPHILS 0.0 0.0 - 0.4 K/UL    ABS. BASOPHILS 0.0 0.0 - 0.1 K/UL    ABS. IMM. GRANS. 0.0 K/UL    DF MANUAL      PLATELET COMMENTS Large Platelets      RBC COMMENTS ANISOCYTOSIS  1+        RBC COMMENTS BURR CELLS  PRESENT        RBC COMMENTS POLYCHROMASIA  1+       MAGNESIUM    Collection Time: 05/13/17  3:04 AM  Result Value Ref Range    Magnesium 1.6 1.6 - 2.4 mg/dL   PHOSPHORUS    Collection Time: 05/13/17  3:04 AM   Result Value Ref Range    Phosphorus 4.2 2.6 - 4.7 MG/DL   METABOLIC PANEL, COMPREHENSIVE    Collection Time: 05/13/17  3:04 AM   Result Value Ref Range    Sodium 135 (L) 136 - 145 mmol/L    Potassium 3.6 3.5 - 5.1 mmol/L    Chloride 96 (L) 97 - 108 mmol/L    CO2 27 21 - 32 mmol/L    Anion gap 12 5 - 15 mmol/L    Glucose 207 (H) 65 - 100 mg/dL    BUN 42 (H) 6 - 20 MG/DL    Creatinine 1.23 (H) 0.55 - 1.02 MG/DL    BUN/Creatinine ratio 34 (H) 12 - 20      GFR est AA 54 (L) >60 ml/min/1.8m    GFR est non-AA 45 (L) >60 ml/min/1.721m   Calcium 6.9 (L) 8.5 - 10.1 MG/DL    Bilirubin, total 1.1 (H) 0.2 - 1.0 MG/DL    ALT (SGPT) 92 (H) 12 - 78 U/L    AST (SGOT) 98 (H) 15 - 37 U/L    Alk. phosphatase 35 (L) 45 - 117 U/L    Protein, total 6.1 (L) 6.4 - 8.2 g/dL    Albumin 2.6 (L) 3.5 - 5.0 g/dL    Globulin 3.5 2.0 - 4.0 g/dL    A-G Ratio 0.7 (L) 1.1 - 2.2     HEMOGLOBIN A1C WITH EAG    Collection Time: 05/13/17  3:04 AM   Result Value Ref Range    Hemoglobin A1c 7.3 (H) 4.2 - 6.3 %    Est. average glucose 163 mg/dL   GLUCOSE, POC    Collection Time: 05/13/17  3:04 AM   Result Value Ref Range    Glucose (POC) 208 (H) 65 - 100 mg/dL    Performed by HEPopejoyPOC    Collection Time: 05/13/17  5:56 AM   Result Value Ref Range    Glucose (POC) 215 (H) 65 - 100 mg/dL    Performed by JeRandalyn Rhea  GLUCOSE, POC    Collection Time: 05/13/17 11:21 AM   Result Value Ref Range    Glucose (POC) 200 (H) 65 - 100 mg/dL    Performed by AlRanda Evens            Assessment:   ?? Perforated posterior ulcer: status post surgical repair     Patient Active Problem List   Diagnosis Code   ??? Hypercholesterolemia E78.00   ??? HTN (hypertension) I10   ??? Diabetes (HCMount HealthyE11.9   ??? Sleep apnea G47.30   ??? Hypertension complicating diabetes (HCDurhamvilleE11.59, I10   ??? Morbid obesity due to excess calories (HCC) E66.01   ??? Hives L50.9   ??? Acute gastric ulcer with perforation (HCGlasgowK25.1     Plan:   ?? Post-op plans per Dr. CaEvelena Asa antibiotics, IVF, NPO  ?? Continue PPI     Signed By: MoRedgie GrayerNP     05/13/2017  5:20 PM       GI attending note:    I personally examined the patient. Agree with the physical, assessment, and plan of the APP. Will sign off. Please call with any questions.    Dr. ThJanese Banks

## 2017-05-13 NOTE — Other (Addendum)
TRANSFER - OUT REPORT:    Verbal report given to Aurther Lofterry, RN(name) on Dana [name]  being transferred to 442(unit) for routine post - op       Report consisted of patient???s Situation, Background, Assessment and   Recommendations(SBAR).     Time Pre op antibiotic given:Continous infusion    Anesthesia Stop time: 0259    Information from the following report(s) SBAR, Kardex, OR Summary, Procedure Summary, Intake/Output, MAR, Recent Results and Cardiac Rhythm NSR was reviewed with the receiving nurse.    Opportunity for questions and clarification was provided.     Is the patient on 02? YES       L/Min 2       Other N/A    Is the patient on a monitor? YES    Is the nurse transporting with the patient? YES    Surgical Waiting Area notified of patient's transfer from PACU? YES      The following personal items collected during your admission accompanied patient upon transfer:   Dental Appliance: Dental Appliances: None  Vision: Visual Aid: None  Hearing Aid:    Jewelry: Jewelry: Necklace  Clothing: Clothing: At bedside  Other Valuables: Other Valuables: Cell Phone, Other (comment) (2 chargers)  Valuables sent to safe:

## 2017-05-13 NOTE — Progress Notes (Signed)
Bedside shift change report given to Schering-PloughCrystal, Rn (Cabin crewoncoming nurse) by Tandy Gawharmain RN (offgoing nurse). Report included the following information SBAR, OR Summary, MAR and Cardiac Rhythm nsr pvc.

## 2017-05-13 NOTE — Brief Op Note (Addendum)
BRIEF OPERATIVE NOTE    Date of Procedure: 05/12/2017   Preoperative Diagnosis: PERFORATED ULCER  Postoperative Diagnosis: Perforated pre pyloric  ulcer    Procedure(s):  LAPAROTOMY EXPLORATORY Repair  PERFORATED Posterior ULCER over sew with patch  Surgeon(s) and Role:     * Hoy FinlayStuart Mohini Heathcock, MD - Primary         Surgical Assistant: Dolak    Surgical Staff:  Circ-1: Dietrich PatesLisa J Seeds, RN  Scrub RN-1: Maylon CosMary F Evers, RN  Surg Asst-1: Baldwin Crownharlotte L Dolak, RN  Event Time In   Incision Start 0041   Incision Close      Anesthesia: General   Estimated Blood Loss: 50  Specimens: * No specimens in log *   Findings: perforated posterior preyloric  ulcer - perforated into the lesser sac   Complications: none   Implants: * No implants in log *

## 2017-05-13 NOTE — Op Note (Signed)
[facility]  OPERATIVE REPORT    Name:Hirt, Maia D.  MR#: [id number]  DOB: [birth year]  ACCOUNT #: [id number]   DATE OF SERVICE: 05/13/2017    SURGEON:  Hoy FinlayStuart Daylyn Azbill, MD    ASSISTANT:  Dolak    ANESTHESIA:  General    PREOPERATIVE DIAGNOSIS:  Perforated gastric ulcer.    POSTOPERATIVE DIAGNOSIS:  Perforated posterior prepyloric ulcer.    PROCEDURES PERFORMED:  Exploratory laparotomy, repair of perforated posterior ulcer with oversew and onlay omental patch.    ESTIMATED BLOOD LOSS:  50      SPECIMENS REMOVED:  None non     COMPLICATIONS:  none    IMPLANTS:  None      CLINICAL INDICATION:  The patient is a 61 year old female who is in the hospital being treated for an urticarial rash with high-dose steroids.  The patient developed nausea and vomiting and severe abdominal pain.  CT scan was performed, which showed a perforated ulcer.  She, therefore, comes to the operating room today for urgent repair of her perforated ulcer.  Informed consent was obtained.      DESCRIPTION OF PROCEDURE:  The patient was brought to the operating room and placed on the operative table in supine position.  General endotracheal anesthesia was then established.  She was then prepped and draped in usual sterile fashion.  A midline incision was then made down to the level of subcutaneous tissue.  Bovie electrocautery was used to come through the subcutaneous tissues and onto the fascia.  The fascia was opened using cautery.  The peritoneum was then entered bluntly.  Once we had opened up the peritoneum, a Bookwalter retractor was then placed.  The area of the antrum and prepylorus were identified.  There appeared to be some leakage and some hematoma-type material in the lesser sac.  We first worked superiorly opening up the peritoneum and found some hematoma; however, no ulcer could be identified.  We then, therefore, went below the stomach and opened up the lesser sac using the LigaSure.  We were on top of the  pancreas but had not damaged the pancreas.  The hematoma was then removed.  We then initially could not find the ulcer.  An NG tube was passed.  Fluid was placed within the abdomen, and using air put into the NG tube, we were able to finally identify the area of the ulcer.  This appeared to be posterior, superior and prepyloric.  We then freed up this such that we could flip it over in order to affect our repair.  The ulcer was then closed in 2 layers, the first layer being full-thickness 3-0 Vicryl sutures interrupted and a second layer oversewing this with 2-0 Vicryl sutures imbricating the suture line.  We then created a tongue of omentum using the LigaSure.  This was then placed over the repair and secured into place using 2-0 Vicryl sutures, thus affecting repair.  Leak test was performed by once again putting air into the NG tube after fluid was placed, and no leak was identified.  A stab incision was made in the left heavy abdomen, and a 19-French JP drain was introduced and placed over our repair.  This was secured in place using 2-0 nylon suture.  The abdomen was then copiously irrigated.  The fascia was closed using #1 PDS, 1 from above, 1 from below, and the skin was then closed using staples.  Dressings were applied.  The patient was awoken in the OR  and taken to PACU in stable condition.      Hoy Finlay, MD       SS / HN  D: 05/13/2017 02:46     T: 05/13/2017 07:29  JOB #: [id number]

## 2017-05-14 LAB — CBC WITH AUTOMATED DIFF
ABS. BASOPHILS: 0 10*3/uL (ref 0.0–0.1)
ABS. EOSINOPHILS: 0 10*3/uL (ref 0.0–0.4)
ABS. IMM. GRANS.: 0 10*3/uL
ABS. LYMPHOCYTES: 0.4 10*3/uL — ABNORMAL LOW (ref 0.8–3.5)
ABS. MONOCYTES: 0.2 10*3/uL (ref 0.0–1.0)
ABS. NEUTROPHILS: 6.2 10*3/uL (ref 1.8–8.0)
ABSOLUTE NRBC: 0 10*3/uL (ref 0.00–0.01)
BAND NEUTROPHILS: 11 % — ABNORMAL HIGH (ref 0–6)
BASOPHILS: 0 % (ref 0–1)
EOSINOPHILS: 0 % (ref 0–7)
HCT: 35.2 % (ref 35.0–47.0)
HGB: 11.4 g/dL — ABNORMAL LOW (ref 11.5–16.0)
IMMATURE GRANULOCYTES: 0 %
LYMPHOCYTES: 6 % — ABNORMAL LOW (ref 12–49)
MCH: 30.4 PG (ref 26.0–34.0)
MCHC: 32.4 g/dL (ref 30.0–36.5)
MCV: 93.9 FL (ref 80.0–99.0)
MONOCYTES: 3 % — ABNORMAL LOW (ref 5–13)
MPV: 10.8 FL (ref 8.9–12.9)
NEUTROPHILS: 80 % — ABNORMAL HIGH (ref 32–75)
NRBC: 0 PER 100 WBC
PLATELET: 205 10*3/uL (ref 150–400)
RBC: 3.75 M/uL — ABNORMAL LOW (ref 3.80–5.20)
RDW: 14.6 % — ABNORMAL HIGH (ref 11.5–14.5)
WBC: 6.8 10*3/uL (ref 3.6–11.0)

## 2017-05-14 LAB — SAMPLES BEING HELD

## 2017-05-14 LAB — METABOLIC PANEL, COMPREHENSIVE
A-G Ratio: 0.6 — ABNORMAL LOW (ref 1.1–2.2)
ALT (SGPT): 50 U/L (ref 12–78)
AST (SGOT): 20 U/L (ref 15–37)
Albumin: 2.1 g/dL — ABNORMAL LOW (ref 3.5–5.0)
Alk. phosphatase: 38 U/L — ABNORMAL LOW (ref 45–117)
Anion gap: 11 mmol/L (ref 5–15)
BUN/Creatinine ratio: 33 — ABNORMAL HIGH (ref 12–20)
BUN: 22 MG/DL — ABNORMAL HIGH (ref 6–20)
Bilirubin, total: 0.6 MG/DL (ref 0.2–1.0)
CO2: 27 mmol/L (ref 21–32)
Calcium: 7.4 MG/DL — ABNORMAL LOW (ref 8.5–10.1)
Chloride: 102 mmol/L (ref 97–108)
Creatinine: 0.66 MG/DL (ref 0.55–1.02)
GFR est AA: >60 mL/min/{1.73_m2} (ref >60)
GFR est non-AA: >60 mL/min/{1.73_m2} (ref >60)
Globulin: 3.8 g/dL (ref 2.0–4.0)
Glucose: 217 mg/dL — ABNORMAL HIGH (ref 65–100)
Potassium: 4 mmol/L (ref 3.5–5.1)
Protein, total: 5.9 g/dL — ABNORMAL LOW (ref 6.4–8.2)
Sodium: 140 mmol/L (ref 136–145)

## 2017-05-14 LAB — GLUCOSE, POC
Glucose (POC): 208 mg/dL — ABNORMAL HIGH (ref 65–100)
Glucose (POC): 229 mg/dL — ABNORMAL HIGH (ref 65–100)
Glucose (POC): 244 mg/dL — ABNORMAL HIGH (ref 65–100)
Glucose (POC): 239 mg/dL — ABNORMAL HIGH (ref 65–100)

## 2017-05-14 LAB — PHOSPHORUS: Phosphorus: 2 MG/DL — ABNORMAL LOW (ref 2.6–4.7)

## 2017-05-14 LAB — MAGNESIUM: Magnesium: 2.1 mg/dL (ref 1.6–2.4)

## 2017-05-14 MED ORDER — ACETAMINOPHEN 1,000 MG/100 ML (10 MG/ML) IV
1000 mg/100 mL (10 mg/mL) | Freq: Four times a day (QID) | INTRAVENOUS | Status: AC | PRN
Start: 2017-05-14 — End: 2017-05-15
  Administered 2017-05-14: 19:00:00 via INTRAVENOUS

## 2017-05-14 MED ORDER — ACETAMINOPHEN 1,000 MG/100 ML (10 MG/ML) IV
1000 mg/100 mL (10 mg/mL) | Freq: Four times a day (QID) | INTRAVENOUS | Status: DC
Start: 2017-05-14 — End: 2017-05-14

## 2017-05-14 MED ORDER — TRIAMTERENE-HYDROCHLOROTHIAZIDE 37.5 MG-25 MG TAB
Freq: Every day | ORAL | Status: DC
Start: 2017-05-14 — End: 2017-05-18
  Administered 2017-05-15 – 2017-05-18 (×4): via ORAL

## 2017-05-14 MED ORDER — LOSARTAN 50 MG TAB
50 mg | Freq: Every evening | ORAL | Status: DC
Start: 2017-05-14 — End: 2017-05-18
  Administered 2017-05-15 – 2017-05-18 (×4): via ORAL

## 2017-05-14 MED ORDER — IPRATROPIUM-ALBUTEROL 2.5 MG-0.5 MG/3 ML NEB SOLUTION
2.5 mg-0.5 mg/3 ml | Freq: Four times a day (QID) | RESPIRATORY_TRACT | Status: DC | PRN
Start: 2017-05-14 — End: 2017-05-18
  Administered 2017-05-15: 12:00:00 via RESPIRATORY_TRACT

## 2017-05-14 MED ORDER — DOCUSATE SODIUM 100 MG CAP
100 mg | Freq: Two times a day (BID) | ORAL | Status: DC
Start: 2017-05-14 — End: 2017-05-16
  Administered 2017-05-14 – 2017-05-16 (×4): via ORAL

## 2017-05-14 MED ORDER — IPRATROPIUM-ALBUTEROL 2.5 MG-0.5 MG/3 ML NEB SOLUTION
2.5 mg-0.5 mg/3 ml | Freq: Four times a day (QID) | RESPIRATORY_TRACT | Status: DC
Start: 2017-05-14 — End: 2017-05-14
  Administered 2017-05-14: 18:00:00 via RESPIRATORY_TRACT

## 2017-05-14 MED ORDER — FENTANYL CITRATE (PF) 50 MCG/ML IJ SOLN
50 mcg/mL | INTRAMUSCULAR | Status: DC | PRN
Start: 2017-05-14 — End: 2017-05-17
  Administered 2017-05-15 – 2017-05-17 (×9): via INTRAVENOUS

## 2017-05-14 MED ORDER — KETOROLAC TROMETHAMINE 30 MG/ML INJECTION
30 mg/mL (1 mL) | Freq: Four times a day (QID) | INTRAMUSCULAR | Status: AC | PRN
Start: 2017-05-14 — End: 2017-05-15
  Administered 2017-05-14 – 2017-05-15 (×4): via INTRAVENOUS

## 2017-05-14 MED ORDER — FENTANYL CITRATE (PF) 50 MCG/ML IJ SOLN
50 mcg/mL | INTRAMUSCULAR | Status: DC | PRN
Start: 2017-05-14 — End: 2017-05-14

## 2017-05-14 MED ORDER — INSULIN NPH HUMAN RECOMB 100 UNIT/ML INJECTION
100 unit/mL | Freq: Two times a day (BID) | SUBCUTANEOUS | Status: DC
Start: 2017-05-14 — End: 2017-05-14

## 2017-05-14 MED FILL — TRIAMTERENE-HYDROCHLOROTHIAZIDE 37.5 MG-25 MG TAB: ORAL | Qty: 1

## 2017-05-14 MED FILL — INSULIN LISPRO 100 UNIT/ML INJECTION: 100 unit/mL | SUBCUTANEOUS | Qty: 1

## 2017-05-14 MED FILL — FENTANYL CITRATE (PF) 50 MCG/ML IJ SOLN: 50 mcg/mL | INTRAMUSCULAR | Qty: 2

## 2017-05-14 MED FILL — FAMOTIDINE (PF) 20 MG/2 ML IV: 20 mg/2 mL | INTRAVENOUS | Qty: 2

## 2017-05-14 MED FILL — NORMAL SALINE FLUSH 0.9 % INJECTION SYRINGE: INTRAMUSCULAR | Qty: 10

## 2017-05-14 MED FILL — SOLU-MEDROL (PF) 40 MG/ML SOLUTION FOR INJECTION: 40 mg/mL | INTRAMUSCULAR | Qty: 1

## 2017-05-14 MED FILL — KETOROLAC TROMETHAMINE 30 MG/ML INJECTION: 30 mg/mL (1 mL) | INTRAMUSCULAR | Qty: 1

## 2017-05-14 MED FILL — ONDANSETRON (PF) 4 MG/2 ML INJECTION: 4 mg/2 mL | INTRAMUSCULAR | Qty: 2

## 2017-05-14 MED FILL — IPRATROPIUM-ALBUTEROL 2.5 MG-0.5 MG/3 ML NEB SOLUTION: 2.5 mg-0.5 mg/3 ml | RESPIRATORY_TRACT | Qty: 3

## 2017-05-14 MED FILL — PROCHLORPERAZINE EDISYLATE 5 MG/ML INJECTION: 5 mg/mL | INTRAMUSCULAR | Qty: 2

## 2017-05-14 MED FILL — DOK 100 MG CAPSULE: 100 mg | ORAL | Qty: 1

## 2017-05-14 MED FILL — NORMAL SALINE FLUSH 0.9 % INJECTION SYRINGE: INTRAMUSCULAR | Qty: 20

## 2017-05-14 MED FILL — PIPERACILLIN-TAZOBACTAM 3.375 GRAM IV SOLR: 3.375 gram | INTRAVENOUS | Qty: 3.38

## 2017-05-14 MED FILL — PROTONIX 40 MG INTRAVENOUS SOLUTION: 40 mg | INTRAVENOUS | Qty: 40

## 2017-05-14 MED FILL — OFIRMEV 1,000 MG/100 ML (10 MG/ML) INTRAVENOUS SOLUTION: 1000 mg/100 mL (10 mg/mL) | INTRAVENOUS | Qty: 100

## 2017-05-14 MED FILL — NS WITH POTASSIUM CHLORIDE 20 MEQ/L IV: 20 mEq/L | INTRAVENOUS | Qty: 1000

## 2017-05-14 NOTE — Progress Notes (Signed)
Problem: Mobility Impaired (Adult and Pediatric)  Goal: *Acute Goals and Plan of Care (Insert Text)  physical Therapy EVALUATION/DISCHARGE  Patient: Dana [name] (61 y.o. female)  Date: 05/14/2017  Primary Diagnosis: Hives  PERFORATED ULCER  Hives  Procedure(s) (LRB):  LAPAROTOMY EXPLORATORY PERFORATED Posterior ULCER REPAIR over sew with patch (N/A) 2 Days Post-Op   Precautions:     ASSESSMENT :  Based on the objective data described below, the patient presents with mildly decreased functional mobility and gait below her baseline.  Pt was ind PTA.  She reports having some difficulty with getting in and out of bed.  Discussed strategies about bracing abdomen and log rolling to get OOB, as well as how she would be able to do this at home.  Pt able to stand and ambulate pushing IV pole (not using for support) 400 feet, with some fatigue noted so use of family member's volunteer chair to return.  Pt very adamant about going to family waiting room to show sister (who is an OT) that she was doing well, but may have overdone it in her enthusiasm.  Pt did display safe gait technique and would be fine to walk with nursing or family if they feel comfortable.  Discussed OOB most of the day and for meals.  Pt lives with partner but does not have steps.  Will discontinue PT at this time.  .    Skilled physical therapy is not indicated at this time.     PLAN :  Discharge Recommendations: None  Further Equipment Recommendations for Discharge: none, has reacher     SUBJECTIVE:   Patient stated ???I feel ok, getting OOB is hard.???    OBJECTIVE DATA SUMMARY:   HISTORY:    Past Medical History:   Diagnosis Date   ??? Diabetes (HCC) 01/24/2013   ??? GERD (gastroesophageal reflux disease)    ??? HTN (hypertension)    ??? Hypercholesterolemia      Past Surgical History:   Procedure Laterality Date   ??? HX HYSTERECTOMY       Prior Level of Function/Home Situation: Ind  Personal factors and/or comorbidities impacting plan of care:     Home Situation   Home Environment: Private residence  One/Two Story Residence: Two story  Living Alone: No  Support Systems: Games developer, Family member(s), Friends \\ neighbors  Patient Expects to be Discharged to:: Private residence  Current DME Used/Available at Home: None    EXAMINATION/PRESENTATION/DECISION MAKING:   Critical Behavior:  Neurologic State: Alert           Hearing:  Auditory  Auditory Impairment: None  Range Of Motion:      WNL         Strength:           WNL      Functional Mobility:  Bed Mobility:      Verbal cues         Transfers:      supervision            Balance:       good    Ambulation/Gait Training:      able to gait 400 feet pushing IV pole with CGA           Functional Measure:  Tinetti test:    Sitting Balance: 1  Arises: 1  Attempts to Rise: 2  Immediate Standing Balance: 2  Standing Balance: 2  Nudged: 2  Eyes Closed: 1  Turn 360 Degrees - Continuous/Discontinuous: 0  Turn 360 Degrees - Steady/Unsteady: 1  Sitting Down: 1  Balance Score: 13  Indication of Gait: 1  R Step Length/Height: 1  L Step Length/Height: 1  R Foot Clearance: 1  L Foot Clearance: 1  Step Symmetry: 1  Step Continuity: 1  Path: 1  Trunk: 2  Walking Time: 0  Gait Score: 10  Total Score: 23       Tinetti Test and G-code impairment scale:  Percentage of Impairment CH    0%   CI    1-19% CJ    20-39% CK    40-59% CL    60-79% CM    80-99% CN     100%   Tinetti  Score 0-28 28 23-27 17-22 12-16 6-11 1-5 0       Tinetti Tool Score Risk of Falls  <19 = High Fall Risk  19-24 = Moderate Fall Risk  25-28 = Low Fall Risk  Tinetti ME. Performance-Oriented Assessment of Mobility Problems in Elderly Patients. JAGS 1986; [id number]:119-126. (Scoring Description: PT Bulletin Feb. 10, 1993)    Older adults: Lonn [place](Ko et al, 2009; n = 1000 [place] elderly evaluated with ABC, POMA, ADL, and IADL)  ?? Mean POMA score for males aged 65-79 years = 26.21(3.40)  ?? Mean POMA score for females age 59-79 years = 25.16(4.30)   ?? Mean POMA score for males over 80 years = 23.29(6.02)  ?? Mean POMA score for females over 80 years = 17.20(8.32)         G codes:  In compliance with CMS???s Claims Based Outcome Reporting, the following G-code set was chosen for this patient based on their primary functional limitation being treated:    The outcome measure chosen to determine the severity of the functional limitation was the Tinetti with a score of 23/28 which was correlated with the impairment scale.    ? Mobility - Walking and Moving Around:    [id number] - CURRENT STATUS: CI - 1%-19% impaired, limited or restricted   G8979 - GOAL STATUS: CI - 1%-19% impaired, limited or restricted   [id number] - D/C STATUS:  CI - 1%-19% impaired, limited or restricted        Physical Therapy Evaluation Charge Determination   History Examination Presentation Decision-Making   MEDIUM  Complexity : 1-2 comorbidities / personal factors will impact the outcome/ POC  LOW Complexity : 1-2 Standardized tests and measures addressing body structure, function, activity limitation and / or participation in recreation  LOW Complexity : Stable, uncomplicated  LOW Complexity : FOTO score of 75-100      Based on the above components, the patient evaluation is determined to be of the following complexity level: LOW     Pain:  Pain Scale 1: Numeric (0 - 10)  Pain Intensity 1: 3  Pain Location 1: Abdomen  Activity Tolerance:   good  Please refer to the flowsheet for vital signs taken during this treatment.  After treatment:   []    Patient left in no apparent distress sitting up in chair  [x]    Patient left in no apparent distress in bed  [x]    Call bell left within reach  [x]    Nursing notified  [x]    Caregiver present  []    Bed alarm activated    COMMUNICATION/EDUCATION:   Communication/Collaboration:  [x]    Fall prevention education was provided and the patient/caregiver indicated understanding.  [x]    Patient/family have participated as able and agree with findings and recommendations.   []   Patient is unable to participate in plan of care at this time.  Findings and recommendations were discussed with: Registered Nurse    Thank you for this referral.  Meriel Flavors, PT   Time Calculation: 29 mins

## 2017-05-14 NOTE — Progress Notes (Signed)
Problem: Falls - Risk of  Goal: *Absence of Falls  Document Schmid Fall Risk and appropriate interventions in the flowsheet.   Outcome: Progressing Towards Goal  Fall Risk Interventions:            Medication Interventions: Patient to call before getting OOB, Teach patient to arise slowly    Elimination Interventions: Call light in reach, Patient to call for help with toileting needs    History of Falls Interventions: Consult care management for discharge planning, Door open when patient unattended, Investigate reason for fall        Problem: Pressure Injury - Risk of  Goal: *Prevention of pressure injury  Document Braden Scale and appropriate interventions in the flowsheet.  Outcome: Progressing Towards Goal  Pressure Injury Interventions:            Activity Interventions: Increase time out of bed    Mobility Interventions: HOB 30 degrees or less, Pressure redistribution bed/mattress (bed type)    Nutrition Interventions: Discuss nutritional consult with provider

## 2017-05-14 NOTE — Progress Notes (Addendum)
Bedside shift change report given to Morrie SheldonAshley, Charity fundraiserN (Cabin crewoncoming nurse) by Schering-PloughCrystal, RN (offgoing nurse). Report included the following information SBAR, Kardex, Intake/Output and Recent Results.     Visit Vitals   ??? BP (!) 195/97 (BP 1 Location: Right arm, BP Patient Position: Post activity;Sitting)   ??? Pulse 86   ??? Temp 98.4 ??F (36.9 ??C)   ??? Resp 12   ??? Ht 5\' 7"  (1.702 m)   ??? Wt 112.5 kg (248 lb 0.3 oz)   ??? SpO2 94%   ??? BMI 38.85 kg/m2     0600: Paged Dr. Mayford Knifeurner on call for Dr. Cherly HensenPahle regarding pt's elevated BP. Notified him that pt has not been on her home losartan 50mg  or triamteride-HCTZ 37.5-25mg  since admission. Orders received to restart both home medications. No PRN orders at this time. MD aware.

## 2017-05-14 NOTE — Progress Notes (Signed)
Bedside verbal shift change report given to oncoming nurse Lilyan GilfordJennifer B, R.N. Opportunity for questions and clarifications provided.

## 2017-05-14 NOTE — Progress Notes (Addendum)
General Surgery Daily Progress Note    Admit Date: 05/11/2017  Post-Operative Day: 2 Days Post-Op from Procedure(s):  LAPAROTOMY EXPLORATORY PERFORATED Posterior ULCER REPAIR over sew with patch     Subjective:     Last 24 hrs: Pt is having a lot of pain not managed by current dose of fentanyl.  Not deep breathing due to pain.  Has weak cough.   Taking ice chips    BS in 200s - on correction scale.  NPH when eating.    Objective:     Blood pressure 137/77, pulse 85, temperature 98.8 ??F (37.1 ??C), resp. rate 15, height 5\' 7"  (1.702 m), weight 265 lb 14 oz (120.6 kg), SpO2 94 %.  Temp (24hrs), Avg:98.3 ??F (36.8 ??C), Min:97.9 ??F (36.6 ??C), Max:98.8 ??F (37.1 ??C)      _____________________  Physical Exam:     Alert and Oriented, x3, in no acute distress.  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)      Morbid obesity due to excess calories (HCC) (05/25/2016)      Hives (05/11/2017)      Acute gastric ulcer with perforation (HCC) (05/13/2017)            Plan:     Increase fentanyl dose  Add prn toradol  D/c foley  OOB and IS - encouraged this  Cont ice chips  UGI Monday  Cont zosyn  Gi/dvt proph  Daily labs    Data Review:    Recent Labs      05/14/17   0218  05/13/17   0304  05/12/17   0205   WBC  6.8  5.9  9.1   HGB  11.4*  13.6  16.3*   HCT  35.2  41.6  51.9*   PLT  205  230  263     Recent Labs      05/14/17   0218  05/13/17   0304  05/12/17   0503   NA  140  135*  133*   K  4.0  3.6  4.1   CL  102  96*  96*   CO2  27  27  26    GLU  217*  207*  270*   BUN  22*  42*  30*   CREA  0.66  1.23*  1.31*   CA  7.4*  6.9*  7.5*   MG  2.1  1.6   --    PHOS  2.0*  4.2   --    ALB  2.1*  2.6*  2.6*   SGOT  20  98*  19    ALT  50  92*  16     Recent Labs      05/12/17   0738   LPSE  150           ______________________  Medications:    Current Facility-Administered Medications   Medication Dose Route Frequency   ??? losartan (COZAAR) tablet 50 mg  50 mg Oral QHS   ??? triamterene-hydroCHLOROthiazide (MAXZIDE) 37.5-25 mg per tablet 1 Tab  1 Tab Oral DAILY   ??? insulin NPH (NOVOLIN  N, HUMULIN N) injection 15 Units  15 Units SubCUTAneous BID   ??? fentaNYL citrate (PF) injection 100 mcg  100 mcg IntraVENous Q3H PRN   ??? ketorolac (TORADOL) injection 15 mg  15 mg IntraVENous Q6H PRN   ??? dextrose (D50W) injection syrg 12.5-25 g  25-50 mL IntraVENous PRN   ??? glucagon (GLUCAGEN) injection 1 mg  1 mg IntraMUSCular PRN   ??? insulin lispro (HUMALOG) injection   SubCUTAneous Q6H   ??? 0.9% sodium chloride with KCl 20 mEq/L infusion   IntraVENous CONTINUOUS   ??? methylPREDNISolone (PF) (SOLU-MEDROL) injection 40 mg  40 mg IntraVENous Q6H   ??? albuterol-ipratropium (DUO-NEB) 2.5 MG-0.5 MG/3 ML  3 mL Nebulization QID RT   ??? prochlorperazine (COMPAZINE) with saline injection 5 mg  5 mg IntraVENous Q6H PRN   ??? famotidine (PF) (PEPCID) 20 mg in sodium chloride 0.9% 10 mL injection  20 mg IntraVENous Q12H   ??? ondansetron (ZOFRAN) injection 4 mg  4 mg IntraVENous Q6H   ??? pantoprazole (PROTONIX) 40 mg in sodium chloride 0.9% 10 mL injection  40 mg IntraVENous Q12H   ??? piperacillin-tazobactam (ZOSYN) 3.375 g in 0.9% sodium chloride (MBP/ADV) 100 mL  3.375 g IntraVENous Q8H   ??? sodium chloride (NS) flush 5-10 mL  5-10 mL IntraVENous Q8H   ??? sodium chloride (NS) flush 5-10 mL  5-10 mL IntraVENous PRN   ??? glucose chewable tablet 16 g  4 Tab Oral PRN   ??? dextrose (D50W) injection syrg 12.5-25 g  12.5-25 g IntraVENous PRN   ??? glucagon (GLUCAGEN) injection 1 mg  1 mg IntraMUSCular PRN       Berneice Gandy, NP  05/14/2017    ADDENDUM:  [name], MD  Pt seen and examined.  No acute surgical issues.  Continue PPI and  antibiotic.  Pain control.  Upper GI study on Monday.  If study is okay then can advance diet.  NPO with ice chips and sips of water for now.

## 2017-05-14 NOTE — Progress Notes (Signed)
Drs. Nilda Calamity, Jeanie Sewer, Pahle, and [place]    Admit Date: 05/11/2017      Subjective:     Day 2 post op. Having moderate pain this AM but overall doing OK. Allergic reaction sx improving. .       Current Facility-Administered Medications   Medication Dose Route Frequency   ??? losartan (COZAAR) tablet 50 mg  50 mg Oral QHS   ??? triamterene-hydroCHLOROthiazide (MAXZIDE) 37.5-25 mg per tablet 1 Tab  1 Tab Oral DAILY   ??? ketorolac (TORADOL) injection 15 mg  15 mg IntraVENous Q6H PRN   ??? fentaNYL citrate (PF) injection 75 mcg  75 mcg IntraVENous Q3H PRN   ??? dextrose (D50W) injection syrg 12.5-25 g  25-50 mL IntraVENous PRN   ??? glucagon (GLUCAGEN) injection 1 mg  1 mg IntraMUSCular PRN   ??? insulin lispro (HUMALOG) injection   SubCUTAneous Q6H   ??? 0.9% sodium chloride with KCl 20 mEq/L infusion   IntraVENous CONTINUOUS   ??? methylPREDNISolone (PF) (SOLU-MEDROL) injection 40 mg  40 mg IntraVENous Q6H   ??? albuterol-ipratropium (DUO-NEB) 2.5 MG-0.5 MG/3 ML  3 mL Nebulization QID RT   ??? prochlorperazine (COMPAZINE) with saline injection 5 mg  5 mg IntraVENous Q6H PRN   ??? famotidine (PF) (PEPCID) 20 mg in sodium chloride 0.9% 10 mL injection  20 mg IntraVENous Q12H   ??? ondansetron (ZOFRAN) injection 4 mg  4 mg IntraVENous Q6H   ??? pantoprazole (PROTONIX) 40 mg in sodium chloride 0.9% 10 mL injection  40 mg IntraVENous Q12H   ??? piperacillin-tazobactam (ZOSYN) 3.375 g in 0.9% sodium chloride (MBP/ADV) 100 mL  3.375 g IntraVENous Q8H   ??? sodium chloride (NS) flush 5-10 mL  5-10 mL IntraVENous Q8H   ??? sodium chloride (NS) flush 5-10 mL  5-10 mL IntraVENous PRN   ??? glucose chewable tablet 16 g  4 Tab Oral PRN   ??? dextrose (D50W) injection syrg 12.5-25 g  12.5-25 g IntraVENous PRN   ??? glucagon (GLUCAGEN) injection 1 mg  1 mg IntraMUSCular PRN          Objective:     Patient Vitals for the past 8 hrs:   BP Temp Pulse Resp SpO2   05/14/17 0802 137/77 98.8 ??F (37.1 ??C) 85 15 94 %    05/14/17 0551 (!) 195/97 98.4 ??F (36.9 ??C) 86 12 94 %        09/06 1901 - 09/08 0700  In: 4320.4 [P.O.:50; I.V.:4270.4]  Out: 2945 [Urine:2855; Drains:90]    Physical Exam: Lungs: clear to auscultation bilaterally  Heart: regular rate and rhythm, S1, S2 normal, no murmur, click, rub or gallop  Abdomen: soft, non-tender. Bowel sounds normal. No masses,  no organomegaly        Data Review   Recent Results (from the past 24 hour(s))   GLUCOSE, POC    Collection Time: 05/13/17 11:21 AM   Result Value Ref Range    Glucose (POC) 200 (H) 65 - 100 mg/dL    Performed by Randa Evens    GLUCOSE, POC    Collection Time: 05/13/17 11:24 PM   Result Value Ref Range    Glucose (POC) 208 (H) 65 - 100 mg/dL    Performed by ROBINSON CRYSTAL    CBC WITH AUTOMATED DIFF    Collection Time: 05/14/17  2:18 AM   Result Value Ref Range    WBC 6.8 3.6 - 11.0 K/uL    RBC 3.75 (L) 3.80 - 5.20 M/uL  HGB 11.4 (L) 11.5 - 16.0 g/dL    HCT 35.2 35.0 - 47.0 %    MCV 93.9 80.0 - 99.0 FL    MCH 30.4 26.0 - 34.0 PG    MCHC 32.4 30.0 - 36.5 g/dL    RDW 14.6 (H) 11.5 - 14.5 %    PLATELET 205 150 - 400 K/uL    MPV 10.8 8.9 - 12.9 FL    NRBC 0.0 0 PER 100 WBC    ABSOLUTE NRBC 0.00 0.00 - 0.01 K/uL    NEUTROPHILS 80 (H) 32 - 75 %    BAND NEUTROPHILS 11 (H) 0 - 6 %    LYMPHOCYTES 6 (L) 12 - 49 %    MONOCYTES 3 (L) 5 - 13 %    EOSINOPHILS 0 0 - 7 %    BASOPHILS 0 0 - 1 %    IMMATURE GRANULOCYTES 0 %    ABS. NEUTROPHILS 6.2 1.8 - 8.0 K/UL    ABS. LYMPHOCYTES 0.4 (L) 0.8 - 3.5 K/UL    ABS. MONOCYTES 0.2 0.0 - 1.0 K/UL    ABS. EOSINOPHILS 0.0 0.0 - 0.4 K/UL    ABS. BASOPHILS 0.0 0.0 - 0.1 K/UL    ABS. IMM. GRANS. 0.0 K/UL    DF MANUAL      PLATELET COMMENTS Large Platelets      RBC COMMENTS POLYCHROMASIA  PRESENT        RBC COMMENTS ANISOCYTOSIS  1+       MAGNESIUM    Collection Time: 05/14/17  2:18 AM   Result Value Ref Range    Magnesium 2.1 1.6 - 2.4 mg/dL   PHOSPHORUS    Collection Time: 05/14/17  2:18 AM   Result Value Ref Range     Phosphorus 2.0 (L) 2.6 - 4.7 MG/DL   METABOLIC PANEL, COMPREHENSIVE    Collection Time: 05/14/17  2:18 AM   Result Value Ref Range    Sodium 140 136 - 145 mmol/L    Potassium 4.0 3.5 - 5.1 mmol/L    Chloride 102 97 - 108 mmol/L    CO2 27 21 - 32 mmol/L    Anion gap 11 5 - 15 mmol/L    Glucose 217 (H) 65 - 100 mg/dL    BUN 22 (H) 6 - 20 MG/DL    Creatinine 0.66 0.55 - 1.02 MG/DL    BUN/Creatinine ratio 33 (H) 12 - 20      GFR est AA >60 >60 ml/min/1.70m    GFR est non-AA >60 >60 ml/min/1.773m   Calcium 7.4 (L) 8.5 - 10.1 MG/DL    Bilirubin, total 0.6 0.2 - 1.0 MG/DL    ALT (SGPT) 50 12 - 78 U/L    AST (SGOT) 20 15 - 37 U/L    Alk. phosphatase 38 (L) 45 - 117 U/L    Protein, total 5.9 (L) 6.4 - 8.2 g/dL    Albumin 2.1 (L) 3.5 - 5.0 g/dL    Globulin 3.8 2.0 - 4.0 g/dL    A-G Ratio 0.6 (L) 1.1 - 2.2     GLUCOSE, POC    Collection Time: 05/14/17  5:14 AM   Result Value Ref Range    Glucose (POC) 229 (H) 65 - 100 mg/dL    Performed by LoChenango  Collection Time: 05/14/17  7:30 AM   Result Value Ref Range    SAMPLES BEING HELD 1UA 1UC     COMMENT  Add-on orders for these samples will be processed based on acceptable specimen integrity and analyte stability, which may vary by analyte.           Assessment:     Active Problems:    Hypercholesterolemia ()      HTN (hypertension) ()      Diabetes ([place]) (01/24/2013)      Hypertension complicating diabetes ([place]) (05/25/2016)      Morbid obesity due to excess calories ([place]) (05/25/2016)      Hives (05/11/2017)      Acute gastric ulcer with perforation ([place]) (05/13/2017)        Plan:     1) Advance as tol  2) PPI  3) mobilize as tol

## 2017-05-15 LAB — CBC WITH AUTOMATED DIFF
ABS. BASOPHILS: 0 10*3/uL (ref 0.0–0.1)
ABS. EOSINOPHILS: 0 10*3/uL (ref 0.0–0.4)
ABS. IMM. GRANS.: 0.2 10*3/uL — ABNORMAL HIGH (ref 0.00–0.04)
ABS. LYMPHOCYTES: 0.8 10*3/uL (ref 0.8–3.5)
ABS. MONOCYTES: 0.5 10*3/uL (ref 0.0–1.0)
ABS. NEUTROPHILS: 6 10*3/uL (ref 1.8–8.0)
ABSOLUTE NRBC: 0 10*3/uL (ref 0.00–0.01)
BASOPHILS: 0 % (ref 0–1)
EOSINOPHILS: 0 % (ref 0–7)
HCT: 32.6 % — ABNORMAL LOW (ref 35.0–47.0)
HGB: 10.4 g/dL — ABNORMAL LOW (ref 11.5–16.0)
IMMATURE GRANULOCYTES: 2 % — ABNORMAL HIGH (ref 0.0–0.5)
LYMPHOCYTES: 11 % — ABNORMAL LOW (ref 12–49)
MCH: 29.9 PG (ref 26.0–34.0)
MCHC: 31.9 g/dL (ref 30.0–36.5)
MCV: 93.7 FL (ref 80.0–99.0)
MONOCYTES: 6 % (ref 5–13)
MPV: 10.5 FL (ref 8.9–12.9)
NEUTROPHILS: 81 % — ABNORMAL HIGH (ref 32–75)
NRBC: 0 PER 100 WBC
PLATELET: 184 10*3/uL (ref 150–400)
RBC: 3.48 M/uL — ABNORMAL LOW (ref 3.80–5.20)
RDW: 14.6 % — ABNORMAL HIGH (ref 11.5–14.5)
WBC: 7.5 10*3/uL (ref 3.6–11.0)

## 2017-05-15 LAB — METABOLIC PANEL, COMPREHENSIVE
A-G Ratio: 0.5 — ABNORMAL LOW (ref 1.1–2.2)
ALT (SGPT): 43 U/L (ref 12–78)
AST (SGOT): 15 U/L (ref 15–37)
Albumin: 2 g/dL — ABNORMAL LOW (ref 3.5–5.0)
Alk. phosphatase: 40 U/L — ABNORMAL LOW (ref 45–117)
Anion gap: 12 mmol/L (ref 5–15)
BUN/Creatinine ratio: 46 — ABNORMAL HIGH (ref 12–20)
BUN: 33 MG/DL — ABNORMAL HIGH (ref 6–20)
Bilirubin, total: 0.5 MG/DL (ref 0.2–1.0)
CO2: 22 mmol/L (ref 21–32)
Calcium: 7.3 MG/DL — ABNORMAL LOW (ref 8.5–10.1)
Chloride: 105 mmol/L (ref 97–108)
Creatinine: 0.71 MG/DL (ref 0.55–1.02)
GFR est AA: >60 mL/min/{1.73_m2} (ref >60)
GFR est non-AA: >60 mL/min/{1.73_m2} (ref >60)
Globulin: 3.8 g/dL (ref 2.0–4.0)
Glucose: 244 mg/dL — ABNORMAL HIGH (ref 65–100)
Potassium: 4.3 mmol/L (ref 3.5–5.1)
Protein, total: 5.8 g/dL — ABNORMAL LOW (ref 6.4–8.2)
Sodium: 139 mmol/L (ref 136–145)

## 2017-05-15 LAB — GLUCOSE, POC
Glucose (POC): 241 mg/dL — ABNORMAL HIGH (ref 65–100)
Glucose (POC): 256 mg/dL — ABNORMAL HIGH (ref 65–100)
Glucose (POC): 284 mg/dL — ABNORMAL HIGH (ref 65–100)
Glucose (POC): 194 mg/dL — ABNORMAL HIGH (ref 65–100)

## 2017-05-15 LAB — MAGNESIUM: Magnesium: 2.5 mg/dL — ABNORMAL HIGH (ref 1.6–2.4)

## 2017-05-15 LAB — PHOSPHORUS: Phosphorus: 2.1 MG/DL — ABNORMAL LOW (ref 2.6–4.7)

## 2017-05-15 MED ORDER — BUTALBITAL-ACETAMINOPHEN-CAFFEINE 50 MG-325 MG-40 MG TAB
50-325-40 mg | ORAL | Status: DC | PRN
Start: 2017-05-15 — End: 2017-05-18
  Administered 2017-05-15 – 2017-05-17 (×4): via ORAL

## 2017-05-15 MED FILL — PROTONIX 40 MG INTRAVENOUS SOLUTION: 40 mg | INTRAVENOUS | Qty: 40

## 2017-05-15 MED FILL — ONDANSETRON (PF) 4 MG/2 ML INJECTION: 4 mg/2 mL | INTRAMUSCULAR | Qty: 2

## 2017-05-15 MED FILL — IPRATROPIUM-ALBUTEROL 2.5 MG-0.5 MG/3 ML NEB SOLUTION: 2.5 mg-0.5 mg/3 ml | RESPIRATORY_TRACT | Qty: 3

## 2017-05-15 MED FILL — INSULIN LISPRO 100 UNIT/ML INJECTION: 100 unit/mL | SUBCUTANEOUS | Qty: 1

## 2017-05-15 MED FILL — TRIAMTERENE-HYDROCHLOROTHIAZIDE 37.5 MG-25 MG TAB: ORAL | Qty: 1

## 2017-05-15 MED FILL — PIPERACILLIN-TAZOBACTAM 3.375 GRAM IV SOLR: 3.375 gram | INTRAVENOUS | Qty: 3.38

## 2017-05-15 MED FILL — INSULIN LISPRO 100 UNIT/ML INJECTION: 100 unit/mL | SUBCUTANEOUS | Qty: 4

## 2017-05-15 MED FILL — KETOROLAC TROMETHAMINE 30 MG/ML INJECTION: 30 mg/mL (1 mL) | INTRAMUSCULAR | Qty: 1

## 2017-05-15 MED FILL — FAMOTIDINE (PF) 20 MG/2 ML IV: 20 mg/2 mL | INTRAVENOUS | Qty: 2

## 2017-05-15 MED FILL — SOLU-MEDROL (PF) 40 MG/ML SOLUTION FOR INJECTION: 40 mg/mL | INTRAMUSCULAR | Qty: 1

## 2017-05-15 MED FILL — LOSARTAN 50 MG TAB: 50 mg | ORAL | Qty: 1

## 2017-05-15 MED FILL — NORMAL SALINE FLUSH 0.9 % INJECTION SYRINGE: INTRAMUSCULAR | Qty: 10

## 2017-05-15 MED FILL — DOK 100 MG CAPSULE: 100 mg | ORAL | Qty: 1

## 2017-05-15 MED FILL — BUTALBITAL-ACETAMINOPHEN-CAFFEINE 50 MG-325 MG-40 MG TAB: 50-325-40 mg | ORAL | Qty: 1

## 2017-05-15 MED FILL — NS WITH POTASSIUM CHLORIDE 20 MEQ/L IV: 20 mEq/L | INTRAVENOUS | Qty: 1000

## 2017-05-15 MED FILL — FENTANYL CITRATE (PF) 50 MCG/ML IJ SOLN: 50 mcg/mL | INTRAMUSCULAR | Qty: 2

## 2017-05-15 NOTE — Progress Notes (Signed)
Drs. Nilda Calamity, Jeanie Sewer, Pahle, and [place]    Admit Date: 05/11/2017      Subjective:     Patient looks much better today. Up and walking. Much improved pain control. .       Current Facility-Administered Medications   Medication Dose Route Frequency   ??? losartan (COZAAR) tablet 50 mg  50 mg Oral QHS   ??? triamterene-hydroCHLOROthiazide (MAXZIDE) 37.5-25 mg per tablet 1 Tab  1 Tab Oral DAILY   ??? fentaNYL citrate (PF) injection 75 mcg  75 mcg IntraVENous Q3H PRN   ??? docusate sodium (COLACE) capsule 100 mg  100 mg Oral BID   ??? acetaminophen (OFIRMEV) infusion 650 mg  650 mg IntraVENous Q6H PRN   ??? albuterol-ipratropium (DUO-NEB) 2.5 MG-0.5 MG/3 ML  3 mL Nebulization Q6H PRN   ??? dextrose (D50W) injection syrg 12.5-25 g  25-50 mL IntraVENous PRN   ??? glucagon (GLUCAGEN) injection 1 mg  1 mg IntraMUSCular PRN   ??? insulin lispro (HUMALOG) injection   SubCUTAneous Q6H   ??? 0.9% sodium chloride with KCl 20 mEq/L infusion   IntraVENous CONTINUOUS   ??? methylPREDNISolone (PF) (SOLU-MEDROL) injection 40 mg  40 mg IntraVENous Q6H   ??? prochlorperazine (COMPAZINE) with saline injection 5 mg  5 mg IntraVENous Q6H PRN   ??? famotidine (PF) (PEPCID) 20 mg in sodium chloride 0.9% 10 mL injection  20 mg IntraVENous Q12H   ??? ondansetron (ZOFRAN) injection 4 mg  4 mg IntraVENous Q6H   ??? pantoprazole (PROTONIX) 40 mg in sodium chloride 0.9% 10 mL injection  40 mg IntraVENous Q12H   ??? piperacillin-tazobactam (ZOSYN) 3.375 g in 0.9% sodium chloride (MBP/ADV) 100 mL  3.375 g IntraVENous Q8H   ??? sodium chloride (NS) flush 5-10 mL  5-10 mL IntraVENous Q8H   ??? sodium chloride (NS) flush 5-10 mL  5-10 mL IntraVENous PRN   ??? glucose chewable tablet 16 g  4 Tab Oral PRN   ??? dextrose (D50W) injection syrg 12.5-25 g  12.5-25 g IntraVENous PRN   ??? glucagon (GLUCAGEN) injection 1 mg  1 mg IntraMUSCular PRN          Objective:     Patient Vitals for the past 8 hrs:   BP Temp Pulse Resp SpO2   05/15/17 0819 137/65 98 ??F (36.7 ??C) 86 19 98 %    05/15/17 0734 - - - - 93 %   05/15/17 0522 (!) 168/109 98.3 ??F (36.8 ??C) 67 18 94 %        09/07 1901 - 09/09 0700  In: 4618.8 [I.V.:4618.8]  Out: 1490 [Urine:1400; Drains:90]    Physical Exam: Lungs: clear to auscultation bilaterally  Heart: regular rate and rhythm, S1, S2 normal, no murmur, click, rub or gallop  Abdomen: soft, non-tender. Bowel sounds normal. No masses,  no organomegaly        Data Review   Recent Results (from the past 24 hour(s))   GLUCOSE, POC    Collection Time: 05/14/17 11:06 AM   Result Value Ref Range    Glucose (POC) 244 (H) 65 - 100 mg/dL    Performed by Dillard Essex    GLUCOSE, POC    Collection Time: 05/14/17  5:47 PM   Result Value Ref Range    Glucose (POC) 239 (H) 65 - 100 mg/dL    Performed by [place], POC    Collection Time: 05/14/17 11:18 PM   Result Value Ref Range    Glucose (POC)  241 (H) 65 - 100 mg/dL    Performed by KWITCHOFF ERIC    CBC WITH AUTOMATED DIFF    Collection Time: 05/15/17  6:19 AM   Result Value Ref Range    WBC 7.5 3.6 - 11.0 K/uL    RBC 3.48 (L) 3.80 - 5.20 M/uL    HGB 10.4 (L) 11.5 - 16.0 g/dL    HCT 32.6 (L) 35.0 - 47.0 %    MCV 93.7 80.0 - 99.0 FL    MCH 29.9 26.0 - 34.0 PG    MCHC 31.9 30.0 - 36.5 g/dL    RDW 14.6 (H) 11.5 - 14.5 %    PLATELET 184 150 - 400 K/uL    MPV 10.5 8.9 - 12.9 FL    NRBC 0.0 0 PER 100 WBC    ABSOLUTE NRBC 0.00 0.00 - 0.01 K/uL    NEUTROPHILS 81 (H) 32 - 75 %    LYMPHOCYTES 11 (L) 12 - 49 %    MONOCYTES 6 5 - 13 %    EOSINOPHILS 0 0 - 7 %    BASOPHILS 0 0 - 1 %    IMMATURE GRANULOCYTES 2 (H) 0.0 - 0.5 %    ABS. NEUTROPHILS 6.0 1.8 - 8.0 K/UL    ABS. LYMPHOCYTES 0.8 0.8 - 3.5 K/UL    ABS. MONOCYTES 0.5 0.0 - 1.0 K/UL    ABS. EOSINOPHILS 0.0 0.0 - 0.4 K/UL    ABS. BASOPHILS 0.0 0.0 - 0.1 K/UL    ABS. IMM. GRANS. 0.2 (H) 0.00 - 0.04 K/UL    DF AUTOMATED     MAGNESIUM    Collection Time: 05/15/17  6:19 AM   Result Value Ref Range    Magnesium 2.5 (H) 1.6 - 2.4 mg/dL   PHOSPHORUS    Collection Time: 05/15/17  6:19 AM    Result Value Ref Range    Phosphorus 2.1 (L) 2.6 - 4.7 MG/DL   METABOLIC PANEL, COMPREHENSIVE    Collection Time: 05/15/17  6:19 AM   Result Value Ref Range    Sodium 139 136 - 145 mmol/L    Potassium 4.3 3.5 - 5.1 mmol/L    Chloride 105 97 - 108 mmol/L    CO2 22 21 - 32 mmol/L    Anion gap 12 5 - 15 mmol/L    Glucose 244 (H) 65 - 100 mg/dL    BUN 33 (H) 6 - 20 MG/DL    Creatinine 0.71 0.55 - 1.02 MG/DL    BUN/Creatinine ratio 46 (H) 12 - 20      GFR est AA >60 >60 ml/min/1.49m    GFR est non-AA >60 >60 ml/min/1.710m   Calcium 7.3 (L) 8.5 - 10.1 MG/DL    Bilirubin, total 0.5 0.2 - 1.0 MG/DL    ALT (SGPT) 43 12 - 78 U/L    AST (SGOT) 15 15 - 37 U/L    Alk. phosphatase 40 (L) 45 - 117 U/L    Protein, total 5.8 (L) 6.4 - 8.2 g/dL    Albumin 2.0 (L) 3.5 - 5.0 g/dL    Globulin 3.8 2.0 - 4.0 g/dL    A-G Ratio 0.5 (L) 1.1 - 2.2     GLUCOSE, POC    Collection Time: 05/15/17  6:43 AM   Result Value Ref Range    Glucose (POC) 256 (H) 65 - 100 mg/dL    Performed by HeAndrena Mews          Assessment:  Active Problems:    Hypercholesterolemia ()      HTN (hypertension) ()      Diabetes ([place]) (01/24/2013)      Hypertension complicating diabetes ([place]) (05/25/2016)      Morbid obesity due to excess calories ([place]) (05/25/2016)      Hives (05/11/2017)      Acute gastric ulcer with perforation ([place]) (05/13/2017)        Plan:     1) Continue to advance as tolerated  2) OOB

## 2017-05-15 NOTE — Progress Notes (Signed)
Bedside shift change report given to Rachel Bullen, RN (oncoming nurse) by Alicia, RN (offgoing nurse). Report included the following information SBAR, Kardex, MAR and Recent Results.

## 2017-05-15 NOTE — Progress Notes (Signed)
Progress Note    Patient: Dana [name] MRN: [id number]  SSN: [id number]    Date of Birth: [birth year]  Age: 61 y.o.  Sex: female      Admit Date: 05/11/2017    3 Days Post-Op    Procedure:  Procedure(s):  LAPAROTOMY EXPLORATORY PERFORATED Posterior ULCER REPAIR over sew with patch    Subjective:     No acute surgical issues.  Pt reported feeling well.  Passing flatus and feeling hungry.  Pain is under control.  Pt reporting headaches and thought it was related to hunger pain.    Objective:     Visit Vitals   ??? BP 117/73 (BP 1 Location: Right arm, BP Patient Position: At rest)   ??? Pulse 84   ??? Temp 97.7 ??F (36.5 ??C)   ??? Resp 16   ??? Ht 5' 7"  (1.702 m)   ??? Wt 255 lb 1.2 oz (115.7 kg)   ??? SpO2 94%   ??? BMI 39.95 kg/m2       Temp (24hrs), Avg:98.3 ??F (36.8 ??C), Min:97.7 ??F (36.5 ??C), Max:98.8 ??F (37.1 ??C)        Physical Exam:    Gen:  NAD  Pulm:  Unlabored  Abd:  S/ND/appropriate TTP  Wound:  C/D/I    Recent Results (from the past 24 hour(s))   GLUCOSE, POC    Collection Time: 05/14/17 11:18 PM   Result Value Ref Range    Glucose (POC) 241 (H) 65 - 100 mg/dL    Performed by KWITCHOFF ERIC    CBC WITH AUTOMATED DIFF    Collection Time: 05/15/17  6:19 AM   Result Value Ref Range    WBC 7.5 3.6 - 11.0 K/uL    RBC 3.48 (L) 3.80 - 5.20 M/uL    HGB 10.4 (L) 11.5 - 16.0 g/dL    HCT 32.6 (L) 35.0 - 47.0 %    MCV 93.7 80.0 - 99.0 FL    MCH 29.9 26.0 - 34.0 PG    MCHC 31.9 30.0 - 36.5 g/dL    RDW 14.6 (H) 11.5 - 14.5 %    PLATELET 184 150 - 400 K/uL    MPV 10.5 8.9 - 12.9 FL    NRBC 0.0 0 PER 100 WBC    ABSOLUTE NRBC 0.00 0.00 - 0.01 K/uL    NEUTROPHILS 81 (H) 32 - 75 %    LYMPHOCYTES 11 (L) 12 - 49 %    MONOCYTES 6 5 - 13 %    EOSINOPHILS 0 0 - 7 %    BASOPHILS 0 0 - 1 %    IMMATURE GRANULOCYTES 2 (H) 0.0 - 0.5 %    ABS. NEUTROPHILS 6.0 1.8 - 8.0 K/UL    ABS. LYMPHOCYTES 0.8 0.8 - 3.5 K/UL    ABS. MONOCYTES 0.5 0.0 - 1.0 K/UL    ABS. EOSINOPHILS 0.0 0.0 - 0.4 K/UL    ABS. BASOPHILS 0.0 0.0 - 0.1 K/UL     ABS. IMM. GRANS. 0.2 (H) 0.00 - 0.04 K/UL    DF AUTOMATED     MAGNESIUM    Collection Time: 05/15/17  6:19 AM   Result Value Ref Range    Magnesium 2.5 (H) 1.6 - 2.4 mg/dL   PHOSPHORUS    Collection Time: 05/15/17  6:19 AM   Result Value Ref Range    Phosphorus 2.1 (L) 2.6 - 4.7 MG/DL   METABOLIC PANEL, COMPREHENSIVE    Collection Time: 05/15/17  6:19 AM  Result Value Ref Range    Sodium 139 136 - 145 mmol/L    Potassium 4.3 3.5 - 5.1 mmol/L    Chloride 105 97 - 108 mmol/L    CO2 22 21 - 32 mmol/L    Anion gap 12 5 - 15 mmol/L    Glucose 244 (H) 65 - 100 mg/dL    BUN 33 (H) 6 - 20 MG/DL    Creatinine 0.71 0.55 - 1.02 MG/DL    BUN/Creatinine ratio 46 (H) 12 - 20      GFR est AA >60 >60 ml/min/1.36m    GFR est non-AA >60 >60 ml/min/1.749m   Calcium 7.3 (L) 8.5 - 10.1 MG/DL    Bilirubin, total 0.5 0.2 - 1.0 MG/DL    ALT (SGPT) 43 12 - 78 U/L    AST (SGOT) 15 15 - 37 U/L    Alk. phosphatase 40 (L) 45 - 117 U/L    Protein, total 5.8 (L) 6.4 - 8.2 g/dL    Albumin 2.0 (L) 3.5 - 5.0 g/dL    Globulin 3.8 2.0 - 4.0 g/dL    A-G Ratio 0.5 (L) 1.1 - 2.2     GLUCOSE, POC    Collection Time: 05/15/17  6:43 AM   Result Value Ref Range    Glucose (POC) 256 (H) 65 - 100 mg/dL    Performed by HeGrizzly FlatsPOC    Collection Time: 05/15/17 11:51 AM   Result Value Ref Range    Glucose (POC) 284 (H) 65 - 100 mg/dL    Performed by CoBenny Lennert  GLUCOSE, POC    Collection Time: 05/15/17  6:09 PM   Result Value Ref Range    Glucose (POC) 194 (H) 65 - 100 mg/dL    Performed by WhPeggye Ley      Assessment:     Hospital Problems  Date Reviewed: 05/16/19/18        Codes Class Noted POA    Acute gastric ulcer with perforation (HCRaleighICD-10-CM: K25.1  ICD-9-CM: 531.10  05/16/19/18o        Hives ICD-10-CM: L50.9  ICD-9-CM: 708.9  05/11/2017 Unknown        Hypertension complicating diabetes (HCTunica ResortsICD-10-CM: E11.59, I10  ICD-9-CM: 250.80, 401.9  05/25/2016 Yes         Morbid obesity due to excess calories (HCChincoteagueICD-10-CM: E66.01  ICD-9-CM: 278.01  05/25/2016 Yes        Diabetes (HCNewtonICD-10-CM: E11.9  ICD-9-CM: 250.00  01/24/2013 Yes        Hypercholesterolemia ICD-10-CM: E78.00  ICD-9-CM: 272.0  Unknown Yes        HTN (hypertension) ICD-10-CM: I10  ICD-9-CM: 401.9  Unknown Yes              Plan/Recommendations/Medical Decision Making:     - Perforated gastric ulcer:  WBC normalizing and pain is under control.  Resolving  - Will obtain upper GI study tomorrow to evaluate for leak.  If upper GI study is normal then advance diet  - Hopefully DC home Tuesday with oral antibiotic  - EGD in 6 -12 weeks to evaluate for resolution of ulcer    Signed By: SiCarlis AbbottMD     May 15, 2017

## 2017-05-15 NOTE — Progress Notes (Signed)
Bedside shift change report given to alicia (oncoming nurse) by Cameron (offgoing nurse). Report included the following information SBAR.

## 2017-05-16 ENCOUNTER — Inpatient Hospital Stay: Admit: 2017-05-16 | Payer: BLUE CROSS/BLUE SHIELD | Primary: Internal Medicine

## 2017-05-16 LAB — CBC WITH AUTOMATED DIFF
ABS. BASOPHILS: 0 10*3/uL (ref 0.0–0.1)
ABS. EOSINOPHILS: 0 10*3/uL (ref 0.0–0.4)
ABS. IMM. GRANS.: 0 10*3/uL
ABS. LYMPHOCYTES: 0.9 10*3/uL (ref 0.8–3.5)
ABS. MONOCYTES: 0.4 10*3/uL (ref 0.0–1.0)
ABS. NEUTROPHILS: 9.3 10*3/uL — ABNORMAL HIGH (ref 1.8–8.0)
ABSOLUTE NRBC: 0 10*3/uL (ref 0.00–0.01)
BAND NEUTROPHILS: 4 % (ref 0–6)
BASOPHILS: 0 % (ref 0–1)
EOSINOPHILS: 0 % (ref 0–7)
HCT: 32.8 % — ABNORMAL LOW (ref 35.0–47.0)
HGB: 10.4 g/dL — ABNORMAL LOW (ref 11.5–16.0)
IMMATURE GRANULOCYTES: 0 %
LYMPHOCYTES: 8 % — ABNORMAL LOW (ref 12–49)
MCH: 29.5 PG (ref 26.0–34.0)
MCHC: 31.7 g/dL (ref 30.0–36.5)
MCV: 93.2 FL (ref 80.0–99.0)
METAMYELOCYTES: 1 % — ABNORMAL HIGH
MONOCYTES: 4 % — ABNORMAL LOW (ref 5–13)
MPV: 10.5 FL (ref 8.9–12.9)
NEUTROPHILS: 83 % — ABNORMAL HIGH (ref 32–75)
NRBC: 0 PER 100 WBC
PLATELET: 211 10*3/uL (ref 150–400)
RBC: 3.52 M/uL — ABNORMAL LOW (ref 3.80–5.20)
RDW: 14.7 % — ABNORMAL HIGH (ref 11.5–14.5)
WBC: 10.7 10*3/uL (ref 3.6–11.0)

## 2017-05-16 LAB — METABOLIC PANEL, COMPREHENSIVE
A-G Ratio: 0.5 — ABNORMAL LOW (ref 1.1–2.2)
ALT (SGPT): 39 U/L (ref 12–78)
AST (SGOT): 15 U/L (ref 15–37)
Albumin: 2.1 g/dL — ABNORMAL LOW (ref 3.5–5.0)
Alk. phosphatase: 42 U/L — ABNORMAL LOW (ref 45–117)
Anion gap: 10 mmol/L (ref 5–15)
BUN/Creatinine ratio: 45 — ABNORMAL HIGH (ref 12–20)
BUN: 30 MG/DL — ABNORMAL HIGH (ref 6–20)
Bilirubin, total: 0.5 MG/DL (ref 0.2–1.0)
CO2: 23 mmol/L (ref 21–32)
Calcium: 7.7 MG/DL — ABNORMAL LOW (ref 8.5–10.1)
Chloride: 107 mmol/L (ref 97–108)
Creatinine: 0.67 MG/DL (ref 0.55–1.02)
GFR est AA: >60 mL/min/{1.73_m2} (ref >60)
GFR est non-AA: >60 mL/min/{1.73_m2} (ref >60)
Globulin: 3.9 g/dL (ref 2.0–4.0)
Glucose: 207 mg/dL — ABNORMAL HIGH (ref 65–100)
Potassium: 4.6 mmol/L (ref 3.5–5.1)
Protein, total: 6 g/dL — ABNORMAL LOW (ref 6.4–8.2)
Sodium: 140 mmol/L (ref 136–145)

## 2017-05-16 LAB — GLUCOSE, POC
Glucose (POC): 205 mg/dL — ABNORMAL HIGH (ref 65–100)
Glucose (POC): 200 mg/dL — ABNORMAL HIGH (ref 65–100)
Glucose (POC): 197 mg/dL — ABNORMAL HIGH (ref 65–100)
Glucose (POC): 221 mg/dL — ABNORMAL HIGH (ref 65–100)

## 2017-05-16 LAB — MAGNESIUM: Magnesium: 2.5 mg/dL — ABNORMAL HIGH (ref 1.6–2.4)

## 2017-05-16 LAB — PHOSPHORUS: Phosphorus: 2.4 MG/DL — ABNORMAL LOW (ref 2.6–4.7)

## 2017-05-16 MED ORDER — PREDNISONE 20 MG TAB
20 mg | Freq: Every day | ORAL | Status: DC
Start: 2017-05-16 — End: 2017-05-18
  Administered 2017-05-16 – 2017-05-17 (×2): via ORAL

## 2017-05-16 MED ORDER — INSULIN NPH HUMAN RECOMB 100 UNIT/ML INJECTION
100 unit/mL | Freq: Every day | SUBCUTANEOUS | Status: DC
Start: 2017-05-16 — End: 2017-05-18
  Administered 2017-05-16 – 2017-05-18 (×3): via SUBCUTANEOUS

## 2017-05-16 MED ORDER — IOHEXOL 350 MG IODINE/ML INTRAVENOUS SOLUTION
350 mg iodine/mL | Freq: Once | INTRAVENOUS | Status: AC
Start: 2017-05-16 — End: 2017-05-16
  Administered 2017-05-16: 12:00:00 via ORAL

## 2017-05-16 MED ORDER — ONDANSETRON (PF) 4 MG/2 ML INJECTION
4 mg/2 mL | INTRAMUSCULAR | Status: DC | PRN
Start: 2017-05-16 — End: 2017-05-18
  Administered 2017-05-17: 14:00:00 via INTRAVENOUS

## 2017-05-16 MED ORDER — ENOXAPARIN 40 MG/0.4 ML SUB-Q SYRINGE
40 mg/0.4 mL | Freq: Two times a day (BID) | SUBCUTANEOUS | Status: DC
Start: 2017-05-16 — End: 2017-05-18
  Administered 2017-05-16 – 2017-05-18 (×7): via SUBCUTANEOUS

## 2017-05-16 MED ORDER — INSULIN NPH HUMAN RECOMB 100 UNIT/ML INJECTION
100 unit/mL | Freq: Every day | SUBCUTANEOUS | Status: DC
Start: 2017-05-16 — End: 2017-05-16

## 2017-05-16 MED FILL — NS WITH POTASSIUM CHLORIDE 20 MEQ/L IV: 20 mEq/L | INTRAVENOUS | Qty: 1000

## 2017-05-16 MED FILL — PROTONIX 40 MG INTRAVENOUS SOLUTION: 40 mg | INTRAVENOUS | Qty: 40

## 2017-05-16 MED FILL — BUTALBITAL-ACETAMINOPHEN-CAFFEINE 50 MG-325 MG-40 MG TAB: 50-325-40 mg | ORAL | Qty: 1

## 2017-05-16 MED FILL — FENTANYL CITRATE (PF) 50 MCG/ML IJ SOLN: 50 mcg/mL | INTRAMUSCULAR | Qty: 2

## 2017-05-16 MED FILL — FAMOTIDINE (PF) 20 MG/2 ML IV: 20 mg/2 mL | INTRAVENOUS | Qty: 2

## 2017-05-16 MED FILL — TRIAMTERENE-HYDROCHLOROTHIAZIDE 37.5 MG-25 MG TAB: ORAL | Qty: 1

## 2017-05-16 MED FILL — INSULIN NPH HUMAN RECOMB 100 UNIT/ML INJECTION: 100 unit/mL | SUBCUTANEOUS | Qty: 1

## 2017-05-16 MED FILL — INSULIN LISPRO 100 UNIT/ML INJECTION: 100 unit/mL | SUBCUTANEOUS | Qty: 1

## 2017-05-16 MED FILL — PIPERACILLIN-TAZOBACTAM 3.375 GRAM IV SOLR: 3.375 gram | INTRAVENOUS | Qty: 3.38

## 2017-05-16 MED FILL — SOLU-MEDROL (PF) 40 MG/ML SOLUTION FOR INJECTION: 40 mg/mL | INTRAMUSCULAR | Qty: 1

## 2017-05-16 MED FILL — ENOXAPARIN 40 MG/0.4 ML SUB-Q SYRINGE: 40 mg/0.4 mL | SUBCUTANEOUS | Qty: 0.4

## 2017-05-16 MED FILL — PREDNISONE 20 MG TAB: 20 mg | ORAL | Qty: 1

## 2017-05-16 MED FILL — LOSARTAN 50 MG TAB: 50 mg | ORAL | Qty: 1

## 2017-05-16 MED FILL — DOK 100 MG CAPSULE: 100 mg | ORAL | Qty: 1

## 2017-05-16 MED FILL — NORMAL SALINE FLUSH 0.9 % INJECTION SYRINGE: INTRAMUSCULAR | Qty: 10

## 2017-05-16 MED FILL — OMNIPAQUE 350 MG IODINE/ML INTRAVENOUS SOLUTION: 350 mg iodine/mL | INTRAVENOUS | Qty: 100

## 2017-05-16 MED FILL — NORMAL SALINE FLUSH 0.9 % INJECTION SYRINGE: INTRAMUSCULAR | Qty: 20

## 2017-05-16 MED FILL — ONDANSETRON (PF) 4 MG/2 ML INJECTION: 4 mg/2 mL | INTRAMUSCULAR | Qty: 2

## 2017-05-16 NOTE — Progress Notes (Signed)
Patient discussed during IDR, also noted patient was transferred from [facility] to [facility] following an Exploratory laparotomy, repair of perforated posterior ulcer with oversew and onlay omental patch on 05/13/17. Patient was independent prior to surgery and also noted that PT did not have any therapy recommendations. Patient will be discharged home in care of her family. CM will continue to follow. Zetta BillsY. Odutola, MSA, RN, CRM

## 2017-05-16 NOTE — Other (Addendum)
DTC Consult Note    Recommendations/ Comments: Consult received for hospital glucose management.  DTC recommendations is to add basal insulin, see below.     Pt will likely still need basal insulin while NPO. Noted steroids being tapered down. Now on Prednisone 20 mg daily. Pt has received 18 units of correction insulin in the past 24 hours     If appropriate, please consider:  - Adding Lantus 20 units daily (while NPO)  - Changing correction insulin to normal sensitivty scale while NPO, If lantus is added.    Current hospital DM medication: Humalog for correction, insulin resistant scale     Chart reviewed on Dana [name].    Patient is a 61 y.o. female with hx Type 2 Diabetes on NPH 32 units HS and Metformin 1000 mg BID at home.    A1c:   Lab Results   Component Value Date/Time    Hemoglobin A1c 7.3 (H) 05/13/2017 03:04 AM    Hemoglobin A1c 7.2 (H) 01/31/2015 03:52 PM       Recent Glucose Results:   Lab Results   Component Value Date/Time    GLU 207 (H) 05/16/2017 05:03 AM    GLUCPOC 200 (H) 05/16/2017 06:21 AM    GLUCPOC 205 (H) 05/16/2017 12:46 AM    GLUCPOC 194 (H) 05/15/2017 06:09 PM        Lab Results   Component Value Date/Time    Creatinine 0.67 05/16/2017 05:03 AM     Estimated Creatinine Clearance: 118.6 mL/min (based on Cr of 0.67).    Active Orders   Diet    DIET NPO With Ice Chips        PO intake: No data found.      Will continue to follow as needed.    Thank you    Burlene ArntNeysa Serra-Valentin, RD, CDE  [facility]  Pager: [phone]

## 2017-05-16 NOTE — Progress Notes (Signed)
Bedside shift change report given to Belenda CruiseKristin (Cabin crewoncoming nurse) by Beverly Gustachele (offgoing nurse). Report included the following information SBAR, Kardex, Procedure Summary, Intake/Output, MAR, Recent Results, Med Rec Status and Cardiac Rhythm SB-SR.

## 2017-05-16 NOTE — Progress Notes (Signed)
Bedside shift change report given to Kristi, RN (oncoming nurse) by Blair, RN (offgoing nurse). Report included the following information SBAR, Kardex, Procedure Summary, MAR and Recent Results.

## 2017-05-16 NOTE — Progress Notes (Signed)
Faculty or Preceptor Review of Student Work    05/16/2017  - Shift times - 0730 to 1315    The student documentation of patient care for Dana [name] has been reviewed and approved.  All medications have been administered under the direct supervision of the faculty or preceptor.    Sander RadonAnne B Barker, RN

## 2017-05-16 NOTE — Other (Addendum)
1520:  Dr. Ephriam Knuckleshristian voiced patient can be transferred if okay with surgery.    1523:  Obtain order from Bary RichardLisa Moss, NP to transfer to surgical off telemetry.

## 2017-05-16 NOTE — Progress Notes (Signed)
Pt's chart opened as pt will be coming to this unit

## 2017-05-16 NOTE — Progress Notes (Signed)
Medical Progress Note      NAME: Dana [name]   DOB:  03/07/1956  MRM:  [id number]    Date/Time: 05/16/2017  9:07 AM         Problem List:     Active Problems:    Hypercholesterolemia ()      HTN (hypertension) ()      Diabetes (HCC) (01/24/2013)      Hypertension complicating diabetes (HCC) (05/25/2016)      Morbid obesity due to excess calories (HCC) (05/25/2016)      Hives (05/11/2017)      Acute gastric ulcer with perforation (HCC) (05/13/2017)             Subjective:     Patient feeling better,  Passing gas    Past Medical History:   Diagnosis Date   ??? Diabetes (HCC) 01/24/2013   ??? GERD (gastroesophageal reflux disease)    ??? HTN (hypertension)    ??? Hypercholesterolemia        ROS:  General: negative for fever, chills, sweats, weakness  Respiratory:  negative for cough, sputum production, SOB, wheezing, DOE, pleuritic pain  Cardiology:  negative for chest pain, palpitations, orthopnea, PND, edema, syncope   Gastrointestinal: positive for abd pain         Objective:       Vitals:          Last 24hrs VS reviewed since prior progress note. Most recent are:    Visit Vitals   ??? BP 160/68 (BP 1 Location: Right arm, BP Patient Position: At rest)   ??? Pulse 99   ??? Temp 97.7 ??F (36.5 ??C)   ??? Resp 16   ??? Ht 5\' 7"  (1.702 m)   ??? Wt 254 lb 3.1 oz (115.3 kg)   ??? SpO2 96%   ??? BMI 39.81 kg/m2     SpO2 Readings from Last 6 Encounters:   05/16/17 96%   05/20/15 94%   11/30/10 100%    O2 Flow Rate (L/min): 3 l/min     Intake/Output Summary (Last 24 hours) at 05/16/17 [id number]  Last data filed at 05/16/17 0400   Gross per 24 hour   Intake          3129.17 ml   Output               30 ml   Net          3099.17 ml          Exam:     General   Obese 61 yo wm  Respiratory   Clear To Auscultation bilaterally - no wheezes, rales, rhonchi, or crackles  Cardiology  Regular Rate and Rythmn  - no murmurs, rubs or gallops  Abdominal  Soft, dressing intact + bs  Extremities  No clubbing, cyanosis, or edema. Pulses intact.    Lab Data Reviewed: (see below)     Medications Reviewed: (see below)    ______________________________________________________________________    Medications:     Current Facility-Administered Medications   Medication Dose Route Frequency   ??? butalbital-acetaminophen-caffeine (FIORICET, ESGIC) 50-325-40 mg per tablet 1 Tab  1 Tab Oral Q4H PRN   ??? losartan (COZAAR) tablet 50 mg  50 mg Oral QHS   ??? triamterene-hydroCHLOROthiazide (MAXZIDE) 37.5-25 mg per tablet 1 Tab  1 Tab Oral DAILY   ??? fentaNYL citrate (PF) injection 75 mcg  75 mcg IntraVENous Q3H PRN   ??? docusate sodium (COLACE) capsule 100 mg  100 mg Oral BID   ???  albuterol-ipratropium (DUO-NEB) 2.5 MG-0.5 MG/3 ML  3 mL Nebulization Q6H PRN   ??? dextrose (D50W) injection syrg 12.5-25 g  25-50 mL IntraVENous PRN   ??? glucagon (GLUCAGEN) injection 1 mg  1 mg IntraMUSCular PRN   ??? insulin lispro (HUMALOG) injection   SubCUTAneous Q6H   ??? 0.9% sodium chloride with KCl 20 mEq/L infusion   IntraVENous CONTINUOUS   ??? methylPREDNISolone (PF) (SOLU-MEDROL) injection 40 mg  40 mg IntraVENous Q6H   ??? prochlorperazine (COMPAZINE) with saline injection 5 mg  5 mg IntraVENous Q6H PRN   ??? famotidine (PF) (PEPCID) 20 mg in sodium chloride 0.9% 10 mL injection  20 mg IntraVENous Q12H   ??? ondansetron (ZOFRAN) injection 4 mg  4 mg IntraVENous Q6H   ??? pantoprazole (PROTONIX) 40 mg in sodium chloride 0.9% 10 mL injection  40 mg IntraVENous Q12H   ??? piperacillin-tazobactam (ZOSYN) 3.375 g in 0.9% sodium chloride (MBP/ADV) 100 mL  3.375 g IntraVENous Q8H   ??? sodium chloride (NS) flush 5-10 mL  5-10 mL IntraVENous Q8H   ??? sodium chloride (NS) flush 5-10 mL  5-10 mL IntraVENous PRN   ??? glucose chewable tablet 16 g  4 Tab Oral PRN   ??? dextrose (D50W) injection syrg 12.5-25 g  12.5-25 g IntraVENous PRN   ??? glucagon (GLUCAGEN) injection 1 mg  1 mg IntraMUSCular PRN            Lab Review:     Recent Labs      05/16/17   0503  05/15/17   0619  05/14/17   0218   WBC  10.7  7.5  6.8   HGB  10.4*  10.4*  11.4*    HCT  32.8*  32.6*  35.2   PLT  211  184  205     Recent Labs      05/16/17   0503  05/15/17   0619  05/14/17   0218   NA  140  139  140   K  4.6  4.3  4.0   CL  107  105  102   CO2  GLU  207*  244*  217*   BUN  30*  33*  22*   CREA  0.67  0.71  0.66   CA  7.7*  7.3*  7.4*   MG  2.5*  2.5*  2.1   PHOS  2.4*  2.1*  2.0*   ALB  2.1*  2.0*  2.1*   TBILI  0.5  0.5  0.6   SGOT  ALT  39  43  50     Lab Results   Component Value Date/Time    Glucose (POC) 200 (H) 05/16/2017 06:21 AM    Glucose (POC) 205 (H) 05/16/2017 12:46 AM    Glucose (POC) 194 (H) 05/15/2017 06:09 PM    Glucose (POC) 284 (H) 05/15/2017 11:51 AM    Glucose (POC) 256 (H) 05/15/2017 06:43 AM     No results for input(s): PH, PCO2, PO2, HCO3, FIO2 in the last 72 hours.  No results for input(s): INR in the last 72 hours.    No lab exists for component: INREXT    Other pertinent lab: NA         Assessment:     Patient Active Problem List   Diagnosis Code   ??? Hypercholesterolemia E78.00   ??? HTN (hypertension) I10   ???  Diabetes (HCC) E11.9   ??? Sleep apnea G47.30   ??? Hypertension complicating diabetes (HCC) E11.59, I10   ??? Morbid obesity due to excess calories (HCC) E66.01   ??? Hives L50.9   ??? Acute gastric ulcer with perforation (HCC) K25.1          Plan:                 1. Hives- resolved off losartan,  2. perf gastric ulcer s/p repair- per surgery.  3. Diabetes- ssi  4. Hypertension complicating diabetes- add back meds,               ___________________________________________________    Attending Physician: Lake Bells, MD

## 2017-05-16 NOTE — Progress Notes (Addendum)
Daily Progress Note  [facility]    Admit Date: 05/11/2017  Post-Operative Day: 3 from Procedure(s):  LAPAROTOMY EXPLORATORY PERFORATED Posterior ULCER REPAIR over sew with patch     Subjective:     Last 24 hrs: Denies pain. Very hungry.  UGI done this am, awaiting radiology read. Ambulating, + flatus.  No BM yet.  WBC 10.7, afebrile.  Continues on zosyn.  Persistent hyperglycemia.       Objective:     Blood pressure 160/68, pulse 99, temperature 97.7 ??F (36.5 ??C), resp. rate 16, height 5\' 7"  (1.702 m), weight 115.3 kg (254 lb 3.1 oz), SpO2 96 %.  Temp (24hrs), Avg:97.8 ??F (36.6 ??C), Min:97.6 ??F (36.4 ??C), Max:98.5 ??F (36.9 ??C)      _____________________  Physical Exam:     Alert and Oriented, sitting up in bed, no acute distress.  Cardiovascular: RRR, no peripheral edema  Lungs:CTAB   Abdomen: soft, NT.  Midline incision healing well.  JP with ss output, 30 mL out yesterday.       Assessment:   Active Problems:    Hypercholesterolemia ()      HTN (hypertension) ()      Diabetes (HCC) (01/24/2013)      Hypertension complicating diabetes (HCC) (05/25/2016)      Morbid obesity due to excess calories (HCC) (05/25/2016)      Hives (05/11/2017)      Acute gastric ulcer with perforation (HCC) (05/13/2017)            Plan:     F/u UGI results  Continue zosyn  OOB as she is doing  Start Lantus (home med) at 15 units/day while NPO  DVT prophylaxis        Bary RichardLisa Moss, ACNP - [facility]  [facility]  [address],  [place], [address]  [place], [place]  [phone]    Data Review:    Recent Labs      05/16/17   0503  05/15/17   0619  05/14/17   0218   WBC  10.7  7.5  6.8   HGB  10.4*  10.4*  11.4*   HCT  32.8*  32.6*  35.2   PLT  211  184  205     Recent Labs      05/16/17   0503  05/15/17   0619  05/14/17   0218   NA  140  139  140   K  4.6  4.3  4.0   CL  107  105  102   CO2  23  22  27    GLU  207*  244*  217*   BUN  30*  33*  22*   CREA  0.67  0.71  0.66   CA  7.7*  7.3*  7.4*    MG  2.5*  2.5*  2.1   PHOS  2.4*  2.1*  2.0*   ALB  2.1*  2.0*  2.1*   TBILI  0.5  0.5  0.6   SGOT  15  15  20    ALT  39  43  50     No results for input(s): AML, LPSE in the last 72 hours.        ______________________  Medications:    Current Facility-Administered Medications   Medication Dose Route Frequency   ??? predniSONE (DELTASONE) tablet 20 mg  20 mg Oral DAILY WITH LUNCH   ???  insulin NPH (NOVOLIN N, HUMULIN N) injection 15 Units  15 Units SubCUTAneous DAILY   ??? butalbital-acetaminophen-caffeine (FIORICET, ESGIC) 50-325-40 mg per tablet 1 Tab  1 Tab Oral Q4H PRN   ??? losartan (COZAAR) tablet 50 mg  50 mg Oral QHS   ??? triamterene-hydroCHLOROthiazide (MAXZIDE) 37.5-25 mg per tablet 1 Tab  1 Tab Oral DAILY   ??? fentaNYL citrate (PF) injection 75 mcg  75 mcg IntraVENous Q3H PRN   ??? albuterol-ipratropium (DUO-NEB) 2.5 MG-0.5 MG/3 ML  3 mL Nebulization Q6H PRN   ??? dextrose (D50W) injection syrg 12.5-25 g  25-50 mL IntraVENous PRN   ??? glucagon (GLUCAGEN) injection 1 mg  1 mg IntraMUSCular PRN   ??? insulin lispro (HUMALOG) injection   SubCUTAneous Q6H   ??? 0.9% sodium chloride with KCl 20 mEq/L infusion   IntraVENous CONTINUOUS   ??? prochlorperazine (COMPAZINE) with saline injection 5 mg  5 mg IntraVENous Q6H PRN   ??? ondansetron (ZOFRAN) injection 4 mg  4 mg IntraVENous Q6H   ??? pantoprazole (PROTONIX) 40 mg in sodium chloride 0.9% 10 mL injection  40 mg IntraVENous Q12H   ??? piperacillin-tazobactam (ZOSYN) 3.375 g in 0.9% sodium chloride (MBP/ADV) 100 mL  3.375 g IntraVENous Q8H   ??? sodium chloride (NS) flush 5-10 mL  5-10 mL IntraVENous Q8H   ??? sodium chloride (NS) flush 5-10 mL  5-10 mL IntraVENous PRN   ??? glucose chewable tablet 16 g  4 Tab Oral PRN   ??? dextrose (D50W) injection syrg 12.5-25 g  12.5-25 g IntraVENous PRN   ??? glucagon (GLUCAGEN) injection 1 mg  1 mg IntraMUSCular PRN       Patient independently interviewed and examined; agree with NP assessment  and plan. No leak on UGI series. Will start clear liquids with plans to advance to full liquids in AM if tolerated and no change in drain fluids. Continue ambulation, spirometry.

## 2017-05-16 NOTE — Progress Notes (Signed)
TRANSFER - OUT REPORT:    Verbal report given to Carlena SaxBlair, RN (name) on Neva SeatRebecca D [name]  being transferred to [address] (unit) for routine progression of care       Report consisted of patient???s Situation, Background, Assessment and   Recommendations(SBAR).     Information from the following report(s) SBAR, Kardex, Intake/Output, MAR and Recent Results was reviewed with the receiving nurse.    Lines:   Peripheral IV 05/13/17 Right Hand (Active)   Site Assessment Clean, dry, & intact 05/14/2017 11:56 PM   Phlebitis Assessment 0 05/16/2017  9:52 AM   Infiltration Assessment 0 05/14/2017 11:56 PM   Dressing Status Clean, dry, & intact 05/14/2017  8:28 PM   Dressing Type Transparent 05/14/2017  8:28 PM   Hub Color/Line Status Infusing;Flushed 05/14/2017  8:28 PM   Action Taken Open ports on tubing capped 05/14/2017  8:28 PM   Alcohol Cap Used Yes 05/14/2017  8:28 PM       Peripheral IV 05/12/17 Left Hand (Active)   Site Assessment Clean, dry, & intact 05/15/2017  5:22 AM   Phlebitis Assessment 0 05/15/2017  5:22 AM   Infiltration Assessment 0 05/15/2017  5:22 AM   Dressing Status Clean, dry, & intact 05/14/2017  8:28 PM   Dressing Type Transparent 05/14/2017  8:28 PM   Hub Color/Line Status Capped;Flushed 05/14/2017  8:28 PM   Action Taken Open ports on tubing capped 05/14/2017  8:28 PM   Alcohol Cap Used Yes 05/14/2017  8:28 PM       Peripheral IV 05/15/17 Right Hand (Active)   Site Assessment Clean, dry, & intact 05/16/2017 11:05 AM   Phlebitis Assessment 0 05/16/2017 11:05 AM   Infiltration Assessment 0 05/16/2017 11:05 AM   Dressing Status Clean, dry, & intact 05/16/2017 11:05 AM   Dressing Type Transparent 05/16/2017 11:05 AM   Hub Color/Line Status Yellow;Infusing 05/16/2017 11:05 AM   Action Taken Open ports on tubing capped 05/16/2017 11:05 AM   Alcohol Cap Used Yes 05/16/2017 11:05 AM        Opportunity for questions and clarification was provided.

## 2017-05-16 NOTE — Progress Notes (Signed)
Went in chart because patient coming to floor

## 2017-05-17 LAB — METABOLIC PANEL, COMPREHENSIVE
A-G Ratio: 0.6 — ABNORMAL LOW (ref 1.1–2.2)
ALT (SGPT): 41 U/L (ref 12–78)
AST (SGOT): 15 U/L (ref 15–37)
Albumin: 2.4 g/dL — ABNORMAL LOW (ref 3.5–5.0)
Alk. phosphatase: 47 U/L (ref 45–117)
Anion gap: 7 mmol/L (ref 5–15)
BUN/Creatinine ratio: 36 — ABNORMAL HIGH (ref 12–20)
BUN: 24 MG/DL — ABNORMAL HIGH (ref 6–20)
Bilirubin, total: 0.6 MG/DL (ref 0.2–1.0)
CO2: 26 mmol/L (ref 21–32)
Calcium: 7.6 MG/DL — ABNORMAL LOW (ref 8.5–10.1)
Chloride: 105 mmol/L (ref 97–108)
Creatinine: 0.66 MG/DL (ref 0.55–1.02)
GFR est AA: >60 mL/min/{1.73_m2} (ref >60)
GFR est non-AA: >60 mL/min/{1.73_m2} (ref >60)
Globulin: 3.8 g/dL (ref 2.0–4.0)
Glucose: 164 mg/dL — ABNORMAL HIGH (ref 65–100)
Potassium: 4.2 mmol/L (ref 3.5–5.1)
Protein, total: 6.2 g/dL — ABNORMAL LOW (ref 6.4–8.2)
Sodium: 138 mmol/L (ref 136–145)

## 2017-05-17 LAB — CBC WITH AUTOMATED DIFF
ABS. BASOPHILS: 0 10*3/uL (ref 0.0–0.1)
ABS. EOSINOPHILS: 0 10*3/uL (ref 0.0–0.4)
ABS. IMM. GRANS.: 0 10*3/uL
ABS. LYMPHOCYTES: 2.4 10*3/uL (ref 0.8–3.5)
ABS. MONOCYTES: 0.6 10*3/uL (ref 0.0–1.0)
ABS. NEUTROPHILS: 8.6 10*3/uL — ABNORMAL HIGH (ref 1.8–8.0)
ABSOLUTE NRBC: 0.02 10*3/uL — ABNORMAL HIGH (ref 0.00–0.01)
BAND NEUTROPHILS: 3 % (ref 0–6)
BASOPHILS: 0 % (ref 0–1)
EOSINOPHILS: 0 % (ref 0–7)
HCT: 35.3 % (ref 35.0–47.0)
HGB: 11.3 g/dL — ABNORMAL LOW (ref 11.5–16.0)
IMMATURE GRANULOCYTES: 0 %
LYMPHOCYTES: 20 % (ref 12–49)
MCH: 29.7 PG (ref 26.0–34.0)
MCHC: 32 g/dL (ref 30.0–36.5)
MCV: 92.7 FL (ref 80.0–99.0)
METAMYELOCYTES: 1 % — ABNORMAL HIGH
MONOCYTES: 5 % (ref 5–13)
MPV: 10.4 FL (ref 8.9–12.9)
MYELOCYTES: 2 % — ABNORMAL HIGH
NEUTROPHILS: 69 % (ref 32–75)
NRBC: 0.2 PER 100 WBC — ABNORMAL HIGH
PLATELET: 220 10*3/uL (ref 150–400)
RBC: 3.81 M/uL (ref 3.80–5.20)
RDW: 14.5 % (ref 11.5–14.5)
WBC: 11.9 10*3/uL — ABNORMAL HIGH (ref 3.6–11.0)

## 2017-05-17 LAB — GLUCOSE, POC
Glucose (POC): 194 mg/dL — ABNORMAL HIGH (ref 65–100)
Glucose (POC): 155 mg/dL — ABNORMAL HIGH (ref 65–100)
Glucose (POC): 143 mg/dL — ABNORMAL HIGH (ref 65–100)
Glucose (POC): 126 mg/dL — ABNORMAL HIGH (ref 65–100)

## 2017-05-17 LAB — PHOSPHORUS: Phosphorus: 2.5 MG/DL — ABNORMAL LOW (ref 2.6–4.7)

## 2017-05-17 LAB — MAGNESIUM: Magnesium: 2 mg/dL (ref 1.6–2.4)

## 2017-05-17 MED ORDER — PANTOPRAZOLE 40 MG TAB, DELAYED RELEASE
40 mg | Freq: Every day | ORAL | Status: DC
Start: 2017-05-17 — End: 2017-05-18
  Administered 2017-05-18: 12:00:00 via ORAL

## 2017-05-17 MED ORDER — HYDROMORPHONE 2 MG/ML INJECTION SOLUTION
2 mg/mL | INTRAMUSCULAR | Status: DC | PRN
Start: 2017-05-17 — End: 2017-05-18

## 2017-05-17 MED ORDER — OXYCODONE-ACETAMINOPHEN 5 MG-325 MG TAB
5-325 mg | ORAL | Status: DC | PRN
Start: 2017-05-17 — End: 2017-05-18
  Administered 2017-05-17 – 2017-05-18 (×3): via ORAL

## 2017-05-17 MED FILL — INSULIN NPH HUMAN RECOMB 100 UNIT/ML INJECTION: 100 unit/mL | SUBCUTANEOUS | Qty: 1

## 2017-05-17 MED FILL — FENTANYL CITRATE (PF) 50 MCG/ML IJ SOLN: 50 mcg/mL | INTRAMUSCULAR | Qty: 2

## 2017-05-17 MED FILL — NORMAL SALINE FLUSH 0.9 % INJECTION SYRINGE: INTRAMUSCULAR | Qty: 10

## 2017-05-17 MED FILL — NS WITH POTASSIUM CHLORIDE 20 MEQ/L IV: 20 mEq/L | INTRAVENOUS | Qty: 1000

## 2017-05-17 MED FILL — OXYCODONE-ACETAMINOPHEN 5 MG-325 MG TAB: 5-325 mg | ORAL | Qty: 2

## 2017-05-17 MED FILL — INSULIN LISPRO 100 UNIT/ML INJECTION: 100 unit/mL | SUBCUTANEOUS | Qty: 1

## 2017-05-17 MED FILL — PIPERACILLIN-TAZOBACTAM 3.375 GRAM IV SOLR: 3.375 gram | INTRAVENOUS | Qty: 3.38

## 2017-05-17 MED FILL — BUTALBITAL-ACETAMINOPHEN-CAFFEINE 50 MG-325 MG-40 MG TAB: 50-325-40 mg | ORAL | Qty: 1

## 2017-05-17 MED FILL — PROTONIX 40 MG INTRAVENOUS SOLUTION: 40 mg | INTRAVENOUS | Qty: 40

## 2017-05-17 MED FILL — TRIAMTERENE-HYDROCHLOROTHIAZIDE 37.5 MG-25 MG TAB: ORAL | Qty: 1

## 2017-05-17 MED FILL — ENOXAPARIN 40 MG/0.4 ML SUB-Q SYRINGE: 40 mg/0.4 mL | SUBCUTANEOUS | Qty: 0.4

## 2017-05-17 MED FILL — ONDANSETRON (PF) 4 MG/2 ML INJECTION: 4 mg/2 mL | INTRAMUSCULAR | Qty: 2

## 2017-05-17 MED FILL — LOSARTAN 50 MG TAB: 50 mg | ORAL | Qty: 1

## 2017-05-17 MED FILL — PREDNISONE 20 MG TAB: 20 mg | ORAL | Qty: 1

## 2017-05-17 NOTE — Progress Notes (Addendum)
On rounds, pt sitting up in chair crying. States she has been in severe pain for past six hours. States that Dr Cherly HensenPahle was in and told her that pain was related to steroids and that they would be stopped. Also that she was not supposed to have IV going anymore. Explained to pt that prednisone was still ordered and that iv fluids had been discontinued but that she would still have iv antibiotics , one of which is zosyn due now that requires four hours to infuse. Pt medicated with fentanyl iv and zofran iv for c/o pain and nausea. Pt states that she is in so much pain that she cannot get out of the chair and back into bed. States she is nauseous and has been unable to take in her breakfast this morning. Will contact Dr Cherly HensenPahle and discuss pt's concerns/requests.     Spoke with nurse in Dr Nelta NumbersPahle's office, MD will not be in until 1230 pm today but message would be given to MD. Kirt BoysMolly Roger's also in to see pt, made aware of issues with poor pain relief. New orders written for IV and Po pain meds.

## 2017-05-17 NOTE — Progress Notes (Signed)
Bedside and Verbal shift change report given to Silva BandyKristi, Charity fundraiserN  (Cabin crewoncoming nurse) by Byrd HesselbachMaria, RN (offgoing nurse). Report included the following information SBAR and Kardex.

## 2017-05-17 NOTE — Progress Notes (Addendum)
General Surgery Daily Progress Note    Admit Date: 05/11/2017  Post-Operative Day: 5 Days Post-Op from Procedure(s):  LAPAROTOMY EXPLORATORY PERFORATED Posterior ULCER REPAIR over sew with patch     Subjective:     Last 24 hrs: Pt is sitting in chair in tears d/t her shoulder, neck, and wrist pain.  Steroids being tapered.  Also have abd cramping - had BM this am.  Ate some breakfast but in too much pain.  Fentanyl doesn't work.     Objective:     Blood pressure (!) 149/93, pulse 78, temperature 98.4 ??F (36.9 ??C), resp. rate 20, height  (1.702 m), weight 254 lb 3.1 oz (115.3 kg), SpO2 96 %.  Temp (24hrs), Avg:98.1 ??F (36.7 ??C), Min:97.6 ??F (36.4 ??C), Max:98.4 ??F (36.9 ??C)      _____________________  Physical Exam:     Alert and Oriented, x3, in no acute distress.  Cardiovascular: RRR, no peripheral edema  Abdomen: soft, +BS, JP w/ ss drainage, incision is clean w/ staples      Assessment:   Active Problems:    Hypercholesterolemia ()      HTN (hypertension) ()      Diabetes (HCC) (01/24/2013)      Hypertension complicating diabetes (HCC) (05/25/2016)      Morbid obesity due to excess calories (HCC) (05/25/2016)      Hives (05/11/2017)      Acute gastric ulcer with perforation (HCC) (05/13/2017)            Plan:     D/c fentanyl  IV dilaudid if needed but try po percocet  Diet adv to diabetic diet  Ambulate when able  Further plans per Dr Cherly Hensen    Data Review:    Recent Labs      05/17/17   0325  05/16/17   0503  05/15/17   0619   WBC  11.9*  10.7  7.5   HGB  11.3*  10.4*  10.4*   HCT  35.3  32.8*  32.6*   PLT  220  211  184     Recent Labs      05/17/17   0325  05/16/17   0503  05/15/17   0619   NA  138  140  139   K  4.2  4.6  4.3   CL  105  107  105   CO2  GLU  164*  207*  244*   BUN  24*  30*  33*   CREA  0.66  0.67  0.71   CA  7.6*  7.7*  7.3*   MG  2.0  2.5*  2.5*   PHOS  2.5*  2.4*  2.1*   ALB  2.4*  2.1*  2.0*   SGOT  ALT  41  39  43      No results for input(s): AML, LPSE in the last 72 hours.        ______________________  Medications:    Current Facility-Administered Medications   Medication Dose Route Frequency   ??? [START ON 05/18/2017] pantoprazole (PROTONIX) tablet 40 mg  40 mg Oral ACB   ??? HYDROmorphone (DILAUDID) injection 1 mg  1 mg IntraVENous Q4H PRN   ??? oxyCODONE-acetaminophen (PERCOCET) 5-325 mg per tablet 2 Tab  2 Tab Oral Q4H PRN   ??? predniSONE (DELTASONE) tablet 20 mg  20 mg Oral DAILY WITH LUNCH   ???  insulin NPH (NOVOLIN N, HUMULIN N) injection 15 Units  15 Units SubCUTAneous DAILY   ??? ondansetron (ZOFRAN) injection 4 mg  4 mg IntraVENous Q4H PRN   ??? enoxaparin (LOVENOX) injection 40 mg  40 mg SubCUTAneous Q12H   ??? butalbital-acetaminophen-caffeine (FIORICET, ESGIC) 50-325-40 mg per tablet 1 Tab  1 Tab Oral Q4H PRN   ??? losartan (COZAAR) tablet 50 mg  50 mg Oral QHS   ??? triamterene-hydroCHLOROthiazide (MAXZIDE) 37.5-25 mg per tablet 1 Tab  1 Tab Oral DAILY   ??? albuterol-ipratropium (DUO-NEB) 2.5 MG-0.5 MG/3 ML  3 mL Nebulization Q6H PRN   ??? dextrose (D50W) injection syrg 12.5-25 g  25-50 mL IntraVENous PRN   ??? glucagon (GLUCAGEN) injection 1 mg  1 mg IntraMUSCular PRN   ??? insulin lispro (HUMALOG) injection   SubCUTAneous Q6H   ??? prochlorperazine (COMPAZINE) with saline injection 5 mg  5 mg IntraVENous Q6H PRN   ??? piperacillin-tazobactam (ZOSYN) 3.375 g in 0.9% sodium chloride (MBP/ADV) 100 mL  3.375 g IntraVENous Q8H   ??? sodium chloride (NS) flush 5-10 mL  5-10 mL IntraVENous Q8H   ??? sodium chloride (NS) flush 5-10 mL  5-10 mL IntraVENous PRN   ??? glucose chewable tablet 16 g  4 Tab Oral PRN   ??? dextrose (D50W) injection syrg 12.5-25 g  12.5-25 g IntraVENous PRN   ??? glucagon (GLUCAGEN) injection 1 mg  1 mg IntraMUSCular PRN       Berneice GandyMary S Rogers, NP  05/17/2017      ADDENDUM:  Kandis CockingSihong Averie Hornbaker, MD  Pt seen and examined.  No acute surgical issues.  Pt reported feeling aweful last night after taking steroid medication.  Pt reported mild  nausea this am.  Upper GI from yesterday showed no leak.  Continue antibiotic therapy.  Hopefully DC home tomorrow

## 2017-05-17 NOTE — Progress Notes (Signed)
Medical Progress Note      NAME: Dana [name]   DOB:  21-Sep-1955  MRM:  [id number]    Date/Time: 05/17/2017  9:14 AM         Problem List:     Active Problems:    Hypercholesterolemia ()      HTN (hypertension) ()      Diabetes (HCC) (01/24/2013)      Hypertension complicating diabetes (HCC) (05/25/2016)      Morbid obesity due to excess calories (HCC) (05/25/2016)      Hives (05/11/2017)      Acute gastric ulcer with perforation (HCC) (05/13/2017)             Subjective:     Patient c/o soreness in hands, shoulders, wrists,      Past Medical History:   Diagnosis Date   ??? Diabetes (HCC) 01/24/2013   ??? GERD (gastroesophageal reflux disease)    ??? HTN (hypertension)    ??? Hypercholesterolemia        ROS:  General: negative for fever, chills, sweats, weakness  Respiratory:  negative for cough, sputum production, SOB, wheezing, DOE, pleuritic pain  Cardiology:  negative for chest pain, palpitations, orthopnea, PND, edema, syncope   Gastrointestinal: negative for abdominal pain, N/V, dysphagia, change in bowel habits, bleeding         Objective:       Vitals:          Last 24hrs VS reviewed since prior progress note. Most recent are:    Visit Vitals   ??? BP 152/88   ??? Pulse 65   ??? Temp 98.1 ??F (36.7 ??C)   ??? Resp 18   ??? Ht  (1.702 m)   ??? Wt 254 lb 3.1 oz (115.3 kg)   ??? SpO2 96%   ??? BMI 39.81 kg/m2     SpO2 Readings from Last 6 Encounters:   05/17/17 96%   05/20/15 94%   11/30/10 100%    O2 Flow Rate (L/min): 3 l/min     Intake/Output Summary (Last 24 hours) at 05/17/17 0915  Last data filed at 05/17/17 0044   Gross per 24 hour   Intake              100 ml   Output              710 ml   Net             -610 ml          Exam:     General   Obese 61 yo wf  Respiratory   Clear To Auscultation bilaterally - no wheezes, rales, rhonchi, or crackles  Cardiology  regular  Abdominal  Soft.  +bs  Extremities  No clubbing, cyanosis, or edema. Pulses intact.    Lab Data Reviewed: (see below)    Medications Reviewed: (see below)     ______________________________________________________________________    Medications:     Current Facility-Administered Medications   Medication Dose Route Frequency   ??? predniSONE (DELTASONE) tablet 20 mg  20 mg Oral DAILY WITH LUNCH   ??? insulin NPH (NOVOLIN N, HUMULIN N) injection 15 Units  15 Units SubCUTAneous DAILY   ??? ondansetron (ZOFRAN) injection 4 mg  4 mg IntraVENous Q4H PRN   ??? enoxaparin (LOVENOX) injection 40 mg  40 mg SubCUTAneous Q12H   ??? butalbital-acetaminophen-caffeine (FIORICET, ESGIC) 50-325-40 mg per tablet 1 Tab  1 Tab Oral Q4H PRN   ??? losartan (COZAAR)  tablet 50 mg  50 mg Oral QHS   ??? triamterene-hydroCHLOROthiazide (MAXZIDE) 37.5-25 mg per tablet 1 Tab  1 Tab Oral DAILY   ??? fentaNYL citrate (PF) injection 75 mcg  75 mcg IntraVENous Q3H PRN   ??? albuterol-ipratropium (DUO-NEB) 2.5 MG-0.5 MG/3 ML  3 mL Nebulization Q6H PRN   ??? dextrose (D50W) injection syrg 12.5-25 g  25-50 mL IntraVENous PRN   ??? glucagon (GLUCAGEN) injection 1 mg  1 mg IntraMUSCular PRN   ??? insulin lispro (HUMALOG) injection   SubCUTAneous Q6H   ??? prochlorperazine (COMPAZINE) with saline injection 5 mg  5 mg IntraVENous Q6H PRN   ??? piperacillin-tazobactam (ZOSYN) 3.375 g in 0.9% sodium chloride (MBP/ADV) 100 mL  3.375 g IntraVENous Q8H   ??? sodium chloride (NS) flush 5-10 mL  5-10 mL IntraVENous Q8H   ??? sodium chloride (NS) flush 5-10 mL  5-10 mL IntraVENous PRN   ??? glucose chewable tablet 16 g  4 Tab Oral PRN   ??? dextrose (D50W) injection syrg 12.5-25 g  12.5-25 g IntraVENous PRN   ??? glucagon (GLUCAGEN) injection 1 mg  1 mg IntraMUSCular PRN            Lab Review:     Recent Labs      05/17/17   0325  05/16/17   0503  05/15/17   0619   WBC  11.9*  10.7  7.5   HGB  11.3*  10.4*  10.4*   HCT  35.3  32.8*  32.6*   PLT  220  211  184     Recent Labs      05/17/17   0325  05/16/17   0503  05/15/17   0619   NA  138  140  139   K  4.2  4.6  4.3   CL  105  107  105   CO2  26  23  22    GLU  164*  207*  244*   BUN  24*  30*  33*    CREA  0.66  0.67  0.71   CA  7.6*  7.7*  7.3*   MG  2.0  2.5*  2.5*   PHOS  2.5*  2.4*  2.1*   ALB  2.4*  2.1*  2.0*   TBILI  0.6  0.5  0.5   SGOT  15  15  15    ALT  41  39  43     Lab Results   Component Value Date/Time    Glucose (POC) 155 (H) 05/17/2017 05:32 AM    Glucose (POC) 194 (H) 05/16/2017 09:29 PM    Glucose (POC) 221 (H) 05/16/2017 06:14 PM    Glucose (POC) 197 (H) 05/16/2017 11:33 AM    Glucose (POC) 200 (H) 05/16/2017 06:21 AM     No results for input(s): PH, PCO2, PO2, HCO3, FIO2 in the last 72 hours.  No results for input(s): INR in the last 72 hours.    No lab exists for component: INREXT    Other pertinent lab: NA         Assessment:     Patient Active Problem List   Diagnosis Code   ??? Hypercholesterolemia E78.00   ??? HTN (hypertension) I10   ??? Diabetes (HCC) E11.9   ??? Sleep apnea G47.30   ??? Hypertension complicating diabetes (HCC) E11.59, I10   ??? Morbid obesity due to excess calories (HCC) E66.01   ??? Hives L50.9   ??? Acute  gastric ulcer with perforation (HCC) K25.1          Plan:                 1. Hives- resolved. Taper steroids  2. perf gastric ulcer, s/p repair - ? Advance diet if ok with surgery  3. Diabetes- agree with restarting nph- should also improve with steroid taper  4. Hypertension- continue meds  5. Possible discharge in am                ___________________________________________________    Attending Physician: Lake Bells, MD

## 2017-05-18 LAB — METABOLIC PANEL, COMPREHENSIVE
A-G Ratio: 0.6 — ABNORMAL LOW (ref 1.1–2.2)
ALT (SGPT): 27 U/L (ref 12–78)
AST (SGOT): 7 U/L — ABNORMAL LOW (ref 15–37)
Albumin: 2.1 g/dL — ABNORMAL LOW (ref 3.5–5.0)
Alk. phosphatase: 46 U/L (ref 45–117)
Anion gap: 7 mmol/L (ref 5–15)
BUN/Creatinine ratio: 19 (ref 12–20)
BUN: 13 MG/DL (ref 6–20)
Bilirubin, total: 0.6 MG/DL (ref 0.2–1.0)
CO2: 28 mmol/L (ref 21–32)
Calcium: 8 MG/DL — ABNORMAL LOW (ref 8.5–10.1)
Chloride: 103 mmol/L (ref 97–108)
Creatinine: 0.67 MG/DL (ref 0.55–1.02)
GFR est AA: >60 mL/min/{1.73_m2} (ref >60)
GFR est non-AA: >60 mL/min/{1.73_m2} (ref >60)
Globulin: 3.7 g/dL (ref 2.0–4.0)
Glucose: 206 mg/dL — ABNORMAL HIGH (ref 65–100)
Potassium: 4.2 mmol/L (ref 3.5–5.1)
Protein, total: 5.8 g/dL — ABNORMAL LOW (ref 6.4–8.2)
Sodium: 138 mmol/L (ref 136–145)

## 2017-05-18 LAB — GLUCOSE, POC
Glucose (POC): 194 mg/dL — ABNORMAL HIGH (ref 65–100)
Glucose (POC): 149 mg/dL — ABNORMAL HIGH (ref 65–100)
Glucose (POC): 126 mg/dL — ABNORMAL HIGH (ref 65–100)

## 2017-05-18 LAB — CBC WITH AUTOMATED DIFF
ABS. BASOPHILS: 0 10*3/uL (ref 0.0–0.1)
ABS. EOSINOPHILS: 0.1 10*3/uL (ref 0.0–0.4)
ABS. IMM. GRANS.: 0 10*3/uL
ABS. LYMPHOCYTES: 1.9 10*3/uL (ref 0.8–3.5)
ABS. MONOCYTES: 0.4 10*3/uL (ref 0.0–1.0)
ABS. NEUTROPHILS: 9.5 10*3/uL — ABNORMAL HIGH (ref 1.8–8.0)
ABSOLUTE NRBC: 0 10*3/uL (ref 0.00–0.01)
BAND NEUTROPHILS: 2 % (ref 0–6)
BASOPHILS: 0 % (ref 0–1)
EOSINOPHILS: 1 % (ref 0–7)
HCT: 35.2 % (ref 35.0–47.0)
HGB: 11.4 g/dL — ABNORMAL LOW (ref 11.5–16.0)
IMMATURE GRANULOCYTES: 0 %
LYMPHOCYTES: 16 % (ref 12–49)
MCH: 29.8 PG (ref 26.0–34.0)
MCHC: 32.4 g/dL (ref 30.0–36.5)
MCV: 92.1 FL (ref 80.0–99.0)
MONOCYTES: 3 % — ABNORMAL LOW (ref 5–13)
MPV: 10.1 FL (ref 8.9–12.9)
MYELOCYTES: 1 % — ABNORMAL HIGH
NEUTROPHILS: 77 % — ABNORMAL HIGH (ref 32–75)
NRBC: 0 PER 100 WBC
PLATELET: 214 10*3/uL (ref 150–400)
RBC: 3.82 M/uL (ref 3.80–5.20)
RDW: 14.4 % (ref 11.5–14.5)
WBC: 12 10*3/uL — ABNORMAL HIGH (ref 3.6–11.0)

## 2017-05-18 LAB — PHOSPHORUS: Phosphorus: 4.5 MG/DL (ref 2.6–4.7)

## 2017-05-18 LAB — MAGNESIUM: Magnesium: 1.9 mg/dL (ref 1.6–2.4)

## 2017-05-18 MED ORDER — TRAMADOL 50 MG TAB
50 mg | ORAL_TABLET | Freq: Four times a day (QID) | ORAL | 0 refills | Status: DC | PRN
Start: 2017-05-18 — End: 2017-05-25

## 2017-05-18 MED ORDER — IPRATROPIUM-ALBUTEROL 2.5 MG-0.5 MG/3 ML NEB SOLUTION
2.5 mg-0.5 mg/3 ml | INHALATION_SOLUTION | Freq: Four times a day (QID) | RESPIRATORY_TRACT | 1 refills | Status: DC | PRN
Start: 2017-05-18 — End: 2017-07-01

## 2017-05-18 MED ORDER — PANTOPRAZOLE 40 MG TAB, DELAYED RELEASE
40 mg | ORAL_TABLET | Freq: Every day | ORAL | 1 refills | Status: DC
Start: 2017-05-18 — End: 2017-07-14

## 2017-05-18 MED ORDER — HYDROXYZINE 25 MG TAB
25 mg | Freq: Four times a day (QID) | ORAL | Status: DC | PRN
Start: 2017-05-18 — End: 2017-05-18
  Administered 2017-05-18: 15:00:00 via ORAL

## 2017-05-18 MED ORDER — PREDNISONE 20 MG TAB
20 mg | ORAL_TABLET | Freq: Every day | ORAL | 0 refills | Status: DC
Start: 2017-05-18 — End: 2017-07-01

## 2017-05-18 MED ORDER — HYDROXYZINE 50 MG TAB
50 mg | ORAL_TABLET | Freq: Four times a day (QID) | ORAL | 2 refills | Status: DC | PRN
Start: 2017-05-18 — End: 2017-05-24

## 2017-05-18 MED FILL — PIPERACILLIN-TAZOBACTAM 3.375 GRAM IV SOLR: 3.375 gram | INTRAVENOUS | Qty: 3.38

## 2017-05-18 MED FILL — NORMAL SALINE FLUSH 0.9 % INJECTION SYRINGE: INTRAMUSCULAR | Qty: 10

## 2017-05-18 MED FILL — INSULIN NPH HUMAN RECOMB 100 UNIT/ML INJECTION: 100 unit/mL | SUBCUTANEOUS | Qty: 1

## 2017-05-18 MED FILL — ENOXAPARIN 40 MG/0.4 ML SUB-Q SYRINGE: 40 mg/0.4 mL | SUBCUTANEOUS | Qty: 0.4

## 2017-05-18 MED FILL — LOSARTAN 50 MG TAB: 50 mg | ORAL | Qty: 1

## 2017-05-18 MED FILL — PANTOPRAZOLE 40 MG TAB, DELAYED RELEASE: 40 mg | ORAL | Qty: 1

## 2017-05-18 MED FILL — PREDNISONE 20 MG TAB: 20 mg | ORAL | Qty: 1

## 2017-05-18 MED FILL — TRIAMTERENE-HYDROCHLOROTHIAZIDE 37.5 MG-25 MG TAB: ORAL | Qty: 1

## 2017-05-18 MED FILL — OXYCODONE-ACETAMINOPHEN 5 MG-325 MG TAB: 5-325 mg | ORAL | Qty: 2

## 2017-05-18 MED FILL — HYDROXYZINE 25 MG TAB: 25 mg | ORAL | Qty: 2

## 2017-05-18 MED FILL — INSULIN LISPRO 100 UNIT/ML INJECTION: 100 unit/mL | SUBCUTANEOUS | Qty: 1

## 2017-05-18 NOTE — Progress Notes (Signed)
Reviewed medical chart; note patient's transfer to [facility] and prior assessments by colleagues Princella Ioniane Baker, BSW/CRM and Timoteo ExposeYetunde Odutola, RN/CRM.      Patient lives with her spouse, Cristie HemDiana Hoover, [phone] and will return home with her at discharge.  PT does not have any home recommendations for the patient.  Care Management will continue to follow her disposition.   Levan HurstEmily R Phillips, MSW

## 2017-05-18 NOTE — Progress Notes (Signed)
Medical Progress Note      NAME: Dana [name]   DOB:  10-23-1955  MRM:  [id number]    Date/Time: 05/18/2017  8:09 AM         Problem List:     Active Problems:    Hypercholesterolemia ()      HTN (hypertension) ()      Diabetes (HCC) (01/24/2013)      Hypertension complicating diabetes (HCC) (05/25/2016)      Morbid obesity due to excess calories (HCC) (05/25/2016)      Hives (05/11/2017)      Acute gastric ulcer with perforation (HCC) (05/13/2017)             Subjective:     Patient feeling better.  Hands and feet starting to itch again    Past Medical History:   Diagnosis Date   ??? Diabetes (HCC) 01/24/2013   ??? GERD (gastroesophageal reflux disease)    ??? HTN (hypertension)    ??? Hypercholesterolemia        ROS:  General: negative for fever, chills, sweats, weakness  Respiratory:  negative for cough, sputum production, SOB, wheezing, DOE, pleuritic pain  Cardiology:  negative for chest pain, palpitations, orthopnea, PND, edema, syncope   Gastrointestinal: negative for abdominal pain, N/V, dysphagia, change in bowel habits, bleeding         Objective:       Vitals:          Last 24hrs VS reviewed since prior progress note. Most recent are:    Visit Vitals   ??? BP 132/84   ??? Pulse 78   ??? Temp 98.1 ??F (36.7 ??C)   ??? Resp 18   ??? Ht  (1.702 m)   ??? Wt 254 lb 3.1 oz (115.3 kg)   ??? SpO2 96%   ??? BMI 39.81 kg/m2     SpO2 Readings from Last 6 Encounters:   05/18/17 96%   05/20/15 94%   11/30/10 100%    O2 Flow Rate (L/min): 3 l/min     Intake/Output Summary (Last 24 hours) at 05/18/17 0809  Last data filed at 05/18/17 0105   Gross per 24 hour   Intake              440 ml   Output               70 ml   Net              370 ml          Exam:     General   Obese 61 yo wf  Respiratory   Coarse breath sounds  Cardiology  reg  Abdominal  Soft, staples intact over incision  Extremities  No clubbing, cyanosis, or edema. Pulses intact.    Lab Data Reviewed: (see below)    Medications Reviewed: (see below)     ______________________________________________________________________    Medications:     Current Facility-Administered Medications   Medication Dose Route Frequency   ??? pantoprazole (PROTONIX) tablet 40 mg  40 mg Oral ACB   ??? HYDROmorphone (DILAUDID) injection 1 mg  1 mg IntraVENous Q4H PRN   ??? oxyCODONE-acetaminophen (PERCOCET) 5-325 mg per tablet 2 Tab  2 Tab Oral Q4H PRN   ??? predniSONE (DELTASONE) tablet 20 mg  20 mg Oral DAILY WITH LUNCH   ??? insulin NPH (NOVOLIN N, HUMULIN N) injection 15 Units  15 Units SubCUTAneous DAILY   ??? ondansetron (ZOFRAN) injection 4 mg  4 mg IntraVENous Q4H PRN   ??? enoxaparin (LOVENOX) injection 40 mg  40 mg SubCUTAneous Q12H   ??? butalbital-acetaminophen-caffeine (FIORICET, ESGIC) 50-325-40 mg per tablet 1 Tab  1 Tab Oral Q4H PRN   ??? losartan (COZAAR) tablet 50 mg  50 mg Oral QHS   ??? triamterene-hydroCHLOROthiazide (MAXZIDE) 37.5-25 mg per tablet 1 Tab  1 Tab Oral DAILY   ??? albuterol-ipratropium (DUO-NEB) 2.5 MG-0.5 MG/3 ML  3 mL Nebulization Q6H PRN   ??? dextrose (D50W) injection syrg 12.5-25 g  25-50 mL IntraVENous PRN   ??? glucagon (GLUCAGEN) injection 1 mg  1 mg IntraMUSCular PRN   ??? insulin lispro (HUMALOG) injection   SubCUTAneous Q6H   ??? prochlorperazine (COMPAZINE) with saline injection 5 mg  5 mg IntraVENous Q6H PRN   ??? piperacillin-tazobactam (ZOSYN) 3.375 g in 0.9% sodium chloride (MBP/ADV) 100 mL  3.375 g IntraVENous Q8H   ??? sodium chloride (NS) flush 5-10 mL  5-10 mL IntraVENous Q8H   ??? sodium chloride (NS) flush 5-10 mL  5-10 mL IntraVENous PRN   ??? glucose chewable tablet 16 g  4 Tab Oral PRN   ??? dextrose (D50W) injection syrg 12.5-25 g  12.5-25 g IntraVENous PRN   ??? glucagon (GLUCAGEN) injection 1 mg  1 mg IntraMUSCular PRN            Lab Review:     Recent Labs      05/18/17   0409  05/17/17   0325  05/16/17   0503   WBC  12.0*  11.9*  10.7   HGB  11.4*  11.3*  10.4*   HCT  35.2  35.3  32.8*   PLT  214  220  211     Recent Labs      05/18/17   0409  05/17/17    0325  05/16/17   0503   NA  138  138  140   K  4.2  4.2  4.6   CL  103  105  107   CO2  GLU  206*  164*  207*   BUN  13  24*  30*   CREA  0.67  0.66  0.67   CA  8.0*  7.6*  7.7*   MG  1.9  2.0  2.5*   PHOS  4.5  2.5*  2.4*   ALB  2.1*  2.4*  2.1*   TBILI  0.6  0.6  0.5   SGOT  7*  15  15   ALT  27  41  39     Lab Results   Component Value Date/Time    Glucose (POC) 149 (H) 05/18/2017 07:28 AM    Glucose (POC) 194 (H) 05/17/2017 08:44 PM    Glucose (POC) 126 (H) 05/17/2017 04:57 PM    Glucose (POC) 143 (H) 05/17/2017 11:42 AM    Glucose (POC) 155 (H) 05/17/2017 05:32 AM     No results for input(s): PH, PCO2, PO2, HCO3, FIO2 in the last 72 hours.  No results for input(s): INR in the last 72 hours.    No lab exists for component: INREXT    Other pertinent lab: NA         Assessment:     Patient Active Problem List   Diagnosis Code   ??? Hypercholesterolemia E78.00   ??? HTN (hypertension) I10   ??? Diabetes (HCC) E11.9   ??? Sleep apnea G47.30   ???  Hypertension complicating diabetes (HCC) E11.59, I10   ??? Morbid obesity due to excess calories (HCC) E66.01   ??? Hives L50.9   ??? Acute gastric ulcer with perforation (HCC) K25.1          Plan:                 1. S/p perf gastric ulcer- tolerating soft diet.  Discharge if ok with surgery  2. Diabetes- continue insulin  3. Htn= d/c losartan since that may be causing allergic rxn  4. Hives- continue low dose steroids                ___________________________________________________    Attending Physician: Lake BellsNancy J Chancey Cullinane, MD

## 2017-05-18 NOTE — Telephone Encounter (Signed)
[facility] and she stated that the Dr was suppose to prescribe something for her allergic reaction but she has not gotten anything yet

## 2017-05-18 NOTE — Wound Image (Signed)
Patient seeking advice for intact serous blister on webbing of left hand between thumb and index finger.   I covered it with Mepitel One and gave her extra pieces to take home.   Also advised her to try and keep blister intact but if it ruptures then use antibiotic ointment and keep it covered.   Rodman PickleJulie Carnesha [name], CWOCN

## 2017-05-18 NOTE — Discharge Summary (Signed)
Physician Discharge Summary     Patient ID:  Dana [name]  [id number]  61 y.o.  07/20/1956    Admit date: 05/11/2017    Discharge date and time: 05/18/2017    Briefly, pt was admitted with Hives;PERFORATED ULCER;Hives.  For details of admission, see H&P.    Hospital Course:  Patient admitted and started on iv steroids for hives.  On her second hospital day she had increased vomiting and chest discomfort.  Rapid response was called - ekg normal.  Her pain increased and exam was consistent with an acute abdomen.  Ct showed pneumoperitoneum and surgery consulted .  She underwent emergency open repair of a perforated gastric ulcer.  Bowel function slowly returned.  bp continued to be elevated so losartan restarted.  She again developed hives so losartan discontinued.  Steroids tapered and she was discharged home.    Discharge Dx: hives diabetes, hypertension complicating diabetes perforated gastric ulcer    Condition at discharge: improved    Disposition: home    Patient Instructions:   Cannot display discharge medications since this patient is not currently admitted.      Follow-up with Dr. [name] in 1 week.      Signed:  Surya Folden J Ezrael Sam, MD  06/07/2017  8:16 PM

## 2017-05-18 NOTE — Progress Notes (Signed)
[facility] Wednesday September 19,2018 @ 2:00 p.m. with Dr. Fayrene FearingNancy Pahle.  Added to AVS.  Delma OfficerKristen Walker, CM Specialist

## 2017-05-18 NOTE — Progress Notes (Signed)
General Surgery Daily Progress Note  Admit Date: 05/11/2017  Post-Operative Day: 6 Days Post-Op from Procedure(s):  LAPAROTOMY EXPLORATORY PERFORATED Posterior ULCER REPAIR over sew with patch     Subjective:     Last 24 hrs: Pt w/ much less joint discomfort this am.  Having some itching.  +BM.  Tolerating diet.       Objective:     Blood pressure 133/77, pulse 70, temperature 97.8 ??F (36.6 ??C), resp. rate 18, height  (1.702 m), weight 254 lb 3.1 oz (115.3 kg), SpO2 93 %.  Temp (24hrs), Avg:98.1 ??F (36.7 ??C), Min:97.8 ??F (36.6 ??C), Max:98.4 ??F (36.9 ??C)      _____________________  Physical Exam:     Alert and Oriented, x3, in no acute distress.  Cardiovascular: RRR, no peripheral edema  Abdomen: soft, nl BS, JP w/ ss drainage, incision w/ staples intact      Assessment:   Active Problems:    Hypercholesterolemia ()      HTN (hypertension) ()      Diabetes (HCC) (01/24/2013)      Hypertension complicating diabetes (HCC) (05/25/2016)      Morbid obesity due to excess calories (HCC) (05/25/2016)      Hives (05/11/2017)      Acute gastric ulcer with perforation (HCC) (05/13/2017)            Plan:     May go home today  Script for ultram provided  F/u w/ Dr Amada Jupiter in 2 weeks    Data Review:    Recent Labs      05/18/17   0409  05/17/17   0325  05/16/17   0503   WBC  12.0*  11.9*  10.7   HGB  11.4*  11.3*  10.4*   HCT  35.2  35.3  32.8*   PLT  214  220  211     Recent Labs      05/18/17   0409  05/17/17   0325  05/16/17   0503   NA  138  138  140   K  4.2  4.2  4.6   CL  103  105  107   CO2  GLU  206*  164*  207*   BUN  13  24*  30*   CREA  0.67  0.66  0.67   CA  8.0*  7.6*  7.7*   MG  1.9  2.0  2.5*   PHOS  4.5  2.5*  2.4*   ALB  2.1*  2.4*  2.1*   SGOT  7*  15  15   ALT  27  41  39     No results for input(s): AML, LPSE in the last 72 hours.        ______________________  Medications:    Current Facility-Administered Medications   Medication Dose Route Frequency    ??? hydrOXYzine HCl (ATARAX) tablet 50 mg  50 mg Oral Q6H PRN   ??? pantoprazole (PROTONIX) tablet 40 mg  40 mg Oral ACB   ??? oxyCODONE-acetaminophen (PERCOCET) 5-325 mg per tablet 2 Tab  2 Tab Oral Q4H PRN   ??? predniSONE (DELTASONE) tablet 20 mg  20 mg Oral DAILY WITH LUNCH   ??? insulin NPH (NOVOLIN N, HUMULIN N) injection 15 Units  15 Units SubCUTAneous DAILY   ??? ondansetron (ZOFRAN) injection 4 mg  4 mg IntraVENous Q4H PRN   ??? enoxaparin (LOVENOX) injection 40  mg  40 mg SubCUTAneous Q12H   ??? butalbital-acetaminophen-caffeine (FIORICET, ESGIC) 50-325-40 mg per tablet 1 Tab  1 Tab Oral Q4H PRN   ??? triamterene-hydroCHLOROthiazide (MAXZIDE) 37.5-25 mg per tablet 1 Tab  1 Tab Oral DAILY   ??? albuterol-ipratropium (DUO-NEB) 2.5 MG-0.5 MG/3 ML  3 mL Nebulization Q6H PRN   ??? dextrose (D50W) injection syrg 12.5-25 g  25-50 mL IntraVENous PRN   ??? glucagon (GLUCAGEN) injection 1 mg  1 mg IntraMUSCular PRN   ??? insulin lispro (HUMALOG) injection   SubCUTAneous Q6H   ??? prochlorperazine (COMPAZINE) with saline injection 5 mg  5 mg IntraVENous Q6H PRN   ??? sodium chloride (NS) flush 5-10 mL  5-10 mL IntraVENous Q8H   ??? sodium chloride (NS) flush 5-10 mL  5-10 mL IntraVENous PRN   ??? glucose chewable tablet 16 g  4 Tab Oral PRN   ??? dextrose (D50W) injection syrg 12.5-25 g  12.5-25 g IntraVENous PRN   ??? glucagon (GLUCAGEN) injection 1 mg  1 mg IntraMUSCular PRN       Berneice GandyMary S Zacharias Ridling, NP  05/18/2017

## 2017-05-18 NOTE — Progress Notes (Signed)
I agree with Maggie's charting

## 2017-05-18 NOTE — Discharge Summary (Signed)
Physician Discharge Summary     Patient ID:  Dana [name]  [id number]  61 y.o.  1955-09-10    Admit date: 05/11/2017    Discharge date and time: 05/18/2017    Briefly, pt was admitted with Hives;PERFORATED ULCER;Hives.  For details of admission, see H&P.    Hospital Course:  Patient admitted and started on iv steroids for hives.  On her second hospital day she had increased vomiting and chest discomfort.  Rapid response was called - ekg normal.  Her pain increased and exam was consistent with an acute abdomen.  Ct showed pneumoperitoneum and surgery consulted .  She underwent emergency open repair of a perforated gastric ulcer.  Bowel function slowly returned.  bp continued to be elevated so losartan restarted.  She again developed hives so losartan discontinued.  Steroids tapered and she was discharged home.    Discharge Dx: hives diabetes, hypertension complicating diabetes perforated gastric ulcer    Condition at discharge: improved    Disposition: home    Patient Instructions:   Cannot display discharge medications since this patient is not currently admitted.      Follow-up with Dr. Cherly HensenPahle in 1 week.      Signed:  Lake BellsNancy J Glenwood Revoir, MD  06/07/2017  8:16 PM

## 2017-05-23 ENCOUNTER — Ambulatory Visit
Admit: 2017-05-23 | Discharge: 2017-05-23 | Payer: PRIVATE HEALTH INSURANCE | Attending: Family | Primary: Internal Medicine

## 2017-05-23 ENCOUNTER — Encounter: Attending: Nurse Practitioner | Primary: Internal Medicine

## 2017-05-23 DIAGNOSIS — Z09 Encounter for follow-up examination after completed treatment for conditions other than malignant neoplasm: Secondary | ICD-10-CM

## 2017-05-23 MED ORDER — SUCRALFATE 100 MG/ML ORAL SUSP
100 mg/mL | Freq: Four times a day (QID) | ORAL | 0 refills | Status: DC
Start: 2017-05-23 — End: 2017-06-22

## 2017-05-23 NOTE — Progress Notes (Signed)
Subjective:   Dana [name] is a 61 y.o. female presents for postop care 10 days following exploratory laparotomy, repair of perforated posterior ulcer with oversew and onlay omental patch by Dr. Amada JupiterShindel.     Appetite is good. Eating a regular diet without difficulty.   Bowel movements are regular.  The patient is not having any pain..  Denies fever, nausea, shortness of breath, chest pain, redness at incision site, vomiting and diarrhea  Carafate - not taking any, none prescribed upon discharge  PPI : taking Protonix  Pt reports that she is not having any problems from her surgery - eating well. However, pt does not know what to use to treat her joint pain now that she cannot take NSAIDs and BC powder.  Pt has f/u with PCP Lake BellsNancy J Pahle, MD tomorrow    Advance directive not on file      Objective:     Visit Vitals   ??? BP 140/80 (BP 1 Location: Right arm, BP Patient Position: Sitting)   ??? Pulse 91   ??? Temp 98 ??F (36.7 ??C) (Oral)   ??? Resp 20   ??? Ht 5\' 7"  (1.702 m)   ??? Wt 235 lb (106.6 kg)   ??? SpO2 98%   ??? BMI 36.81 kg/m2       General:  alert, no distress   Abdomen: soft, bowel sounds active, non-tender   Incision:   healing well, no drainage, no erythema, no seroma, no swelling, no dehiscence, incisions well approximated with staples   Heart: regular rate and rhythm, S1, S2 normal, no murmur, click, rub or gallop   Lungs: clear to auscultation bilaterally         Assessment:     S/P exploratory laparotomy, repair of perforated posterior ulcer with oversew and onlay omental patch  Perforated gastric ulcer.   Doing well postoperatively.    Plan:     1. Pt is to increase activities as tolerated..  2. Follow-up prn  3. Wound care discussed - removed staples without incident, applied benzoin and steristrips  4. Perf gastric ulcer: continue Protonix and start carafate four times daily. Rx carafate given.  5. OA pain control: recommend tylenol 650 mg - 1g, up to 4g/day. Pt to d/w PCP Lake BellsNancy J Pahle, MD       Patient verbalized understanding and agreement.    Dana [name] has a reminder for a "due or due soon" health maintenance. I have asked that she contact her primary care provider for follow-up on this health maintenance.

## 2017-05-23 NOTE — Patient Instructions (Addendum)
Peptic Ulcer Disease: Care Instructions  Your Care Instructions    Peptic ulcers are sores on the inside of the stomach or the small intestine (such as a duodenal ulcer). They are most often caused by an infection with bacteria or from use of nonsteroidal anti-inflammatory drugs (NSAIDs). NSAIDs include aspirin, ibuprofen (Advil), and naproxen (Aleve).  Your doctor may have prescribed medicine to reduce stomach acid. You also may need to take antibiotics if your peptic ulcers are caused by an infection. You can help yourself heal and keep ulcers from coming back by making some changes in your lifestyle. Quit smoking. Limit alcohol.  Follow-up care is a key part of your treatment and safety. Be sure to make and go to all appointments, and call your doctor if you are having problems. It's also a good idea to know your test results and keep a list of the medicines you take.  How can you care for yourself at home?  ?? Be safe with medicines. Take your medicines exactly as prescribed. Call your doctor if you think you are having a problem with your medicine.  ?? Do not take aspirin or other NSAIDs such as ibuprofen (Advil or Motrin) or naproxen (Aleve). Ask your doctor what you can take for pain.  ?? Do not smoke. Smoking can make ulcers worse. If you need help quitting, talk to your doctor about stop-smoking programs and medicines. These can increase your chances of quitting for good.  ?? Drink in moderation or avoid drinking alcohol.  ?? Eat a balanced diet of small, frequent meals. See a dietitian if you need help planning your meals.  When should you call for help?  Call911 anytime you think you may need emergency care. For example, call if:  ?? ?? You vomit blood or what looks like coffee grounds.   ?? ?? You pass maroon or very bloody stools.   ??Call your doctor now or seek immediate medical care if:  ?? ?? You have new or worse belly pain.   ?? ?? Your stools are black and look like tar, or they have streaks of blood.    ?? ?? You vomit.   ??Watch closely for changes in your health, and be sure to contact your doctor if:  ?? ?? You do not get better as expected.   Where can you learn more?  Go to http://www.healthwise.net/[employer].  Enter Z086 in the search box to learn more about "Peptic Ulcer Disease: Care Instructions."  Current as of: March 29, 2016  Content Version: 11.7  ?? 2006-2018 [employer]. Care instructions adapted under license by [employer] (which disclaims liability or warranty for this information). If you have questions about a medical condition or this instruction, always ask your healthcare professional. [employer] disclaims any warranty or liability for your use of this information.

## 2017-05-23 NOTE — Progress Notes (Signed)
1. Have you been to the ER, urgent care clinic since your last visit?  Hospitalized since your last visit?No    2. Have you seen or consulted any other health care providers outside of the [facility] since your last visit?  Include any pap smears or colon screening. No

## 2017-05-24 ENCOUNTER — Ambulatory Visit: Admit: 2017-05-24 | Payer: PRIVATE HEALTH INSURANCE | Attending: Internal Medicine | Primary: Internal Medicine

## 2017-05-24 DIAGNOSIS — I10 Essential (primary) hypertension: Secondary | ICD-10-CM

## 2017-05-24 MED ORDER — PREDNISONE 20 MG TAB
20 mg | ORAL_TABLET | Freq: Two times a day (BID) | ORAL | 1 refills | Status: DC
Start: 2017-05-24 — End: 2017-07-17

## 2017-05-24 NOTE — Progress Notes (Signed)
Dana [name] is a 61 y.o. female and presents with   Chief Complaint   Patient presents with   ??? Other     follow up from hospital   .  Patient c/o having terrible joint pain.  Not sleeping due to pain, no pain from surgery.  Joint pain started abruptly in hospital after surgery during taper of steroids which she was getting due to hives.  Itching has resolved. No further hives.  Joints are still, and sore, not hot or swollen.       Current Outpatient Prescriptions   Medication Sig Dispense Refill   ??? predniSONE (DELTASONE) 20 mg tablet Take 1 Tab by mouth two (2) times a day. 60 Tab 1   ??? sucralfate (CARAFATE) 100 mg/mL suspension Take 10 mL by mouth four (4) times daily for 28 days. Indications: gastric ulcer 1120 mL 0   ??? traMADol (ULTRAM) 50 mg tablet Take 1 Tab by mouth every six (6) hours as needed for Pain. Max Daily Amount: 200 mg. 25 Tab 0   ??? albuterol-ipratropium (DUO-NEB) 2.5 mg-0.5 mg/3 ml nebu 3 mL by Nebulization route every six (6) hours as needed. 30 Nebule 1   ??? hydrOXYzine HCl (ATARAX) 50 mg tablet Take 1 Tab by mouth every six (6) hours as needed for Itching for up to 10 days. 100 Tab 2   ??? pantoprazole (PROTONIX) 40 mg tablet Take 1 Tab by mouth Daily (before breakfast). 30 Tab 1   ??? predniSONE (DELTASONE) 20 mg tablet Take 1 Tab by mouth daily (with lunch). 20 Tab 0   ??? simvastatin (ZOCOR) 40 mg tablet TAKE 1 TABLET NIGHTLY 90 Tab 1   ??? lidocaine (LIDODERM) 5 % 1 Patch by TransDERmal route every twelve (12) hours as needed. Apply patch to the affected area for 12 hours a day and remove for 12 hours a day.     ??? metFORMIN ER (GLUCOPHAGE XR) 500 mg tablet Take 1,000 mg by mouth two (2) times daily (with meals).     ??? sertraline (ZOLOFT) 100 mg tablet TAKE 1 TABLET BY MOUTH DAILY 90 Tab 3   ??? insulin NPH (HUMULIN N NPH INSULIN KWIKPEN) 100 unit/mL (3 mL) inpn INJECT 32 UNITS SUBCUTANEOUSLY AT BEDTIME 30 Adjustable Dose Pre-filled Pen Syringe 2    ??? triamterene-hydroCHLOROthiazide (MAXZIDE) 37.5-25 mg per tablet TAKE 1 TABLET DAILY 90 Tab 2     Allergies   Allergen Reactions   ??? Adhesive Itching   ??? Dolobid [Diflunisal] Itching     Itching,swelling     Past Medical History:   Diagnosis Date   ??? Diabetes ([place]) 01/24/2013   ??? GERD (gastroesophageal reflux disease)    ??? Hives    ??? HTN (hypertension)    ??? Hypercholesterolemia    ??? Perforated gastric ulcer ([place])      Past Surgical History:   Procedure Laterality Date   ??? ABDOMEN SURGERY PROC UNLISTED  05/12/2017    Lap exploratory perforated posterior ulcer repair by Dr. Claudie Leach    ??? HX HYSTERECTOMY       Family History   Problem Relation Age of Onset   ??? Hypertension Mother    ??? Cancer Father      mesothelioma   ??? Diabetes Father    ??? Hypertension Father      Social History   Substance Use Topics   ??? Smoking status: Former Smoker     Packs/day: 1.00     Quit date: 06/27/2011   ???  Smokeless tobacco: Never Used   ??? Alcohol use 0.6 - 1.2 oz/week     1 - 2 Glasses of wine per week      Comment: Per month       The patient does not have a history of falls. A plan of care for falls was documented..  Depression screen neg.      Objective:  Visit Vitals   ??? BP 130/90   ??? Ht 5' 4"  (1.626 m)   ??? Wt 232 lb (105.2 kg)   ??? BMI 39.82 kg/m2     wdwn  61 yo wf  In NAD. A&O.  HEENT -- Pupils round.  O/P Clear.  Neck -- Supple. No JVD.  Heart -- RRR. No R/M/G.  Lungs -- CTA.  Abdomen -- Soft. Non-tender. Non-distended. No masses. Bowel sounds present.  Extremities -- No edema.    Joints- no synovitis,     Assessment/Plan:    ICD-10-CM ICD-9-CM    1. Essential hypertension I10 401.9    2. Type 2 diabetes mellitus with hyperglycemia, with long-term current use of insulin (HCC) E11.65 250.00     Z79.4 790.29      V58.67    3. Hypertension complicating diabetes ([place]) E11.59 250.80     I10 401.9    4. Morbid obesity due to excess calories (HCC) E66.01 278.01    5. Pain of both shoulder joints M25.511 719.41 SED RATE (ESR)     M25.512  COLLECTION VENOUS BLOOD,VENIPUNCTURE      METABOLIC PANEL, COMPREHENSIVE      RHEUMATOID FACTOR, QL      ANA W/REFLEX     Monitor blood sugars  Increase prednisone to 20 mg bid      Author:  Genene Churn, MD 05/24/2017 3:39 PM

## 2017-05-25 ENCOUNTER — Encounter: Attending: Internal Medicine | Primary: Internal Medicine

## 2017-05-25 ENCOUNTER — Encounter

## 2017-05-25 LAB — METABOLIC PANEL, COMPREHENSIVE
A-G Ratio: 1.4 (ref 1.2–2.2)
ALT (SGPT): 16 IU/L (ref 0–32)
AST (SGOT): 9 IU/L (ref 0–40)
Albumin: 3.8 g/dL (ref 3.5–5.5)
Alk. phosphatase: 69 IU/L (ref 39–117)
BUN/Creatinine ratio: 34 — ABNORMAL HIGH (ref 9–23)
BUN: 21 mg/dL (ref 6–24)
Bilirubin, total: 0.3 mg/dL (ref 0.0–1.2)
CO2: 20 mmol/L (ref 20–29)
Calcium: 9.7 mg/dL (ref 8.7–10.2)
Chloride: 95 mmol/L — ABNORMAL LOW (ref 96–106)
Creatinine: 0.62 mg/dL (ref 0.57–1.00)
GFR est AA: 114 mL/min/{1.73_m2} (ref >59)
GFR est non-AA: 99 mL/min/{1.73_m2} (ref >59)
GLOBULIN, TOTAL: 2.7 g/dL (ref 1.5–4.5)
Glucose: 203 mg/dL — ABNORMAL HIGH (ref 65–99)
Potassium: 4.2 mmol/L (ref 3.5–5.2)
Protein, total: 6.5 g/dL (ref 6.0–8.5)
Sodium: 136 mmol/L (ref 134–144)

## 2017-05-25 LAB — ANA, RFLX TO 5-BIOMARKER PROFILE(ENA)
ANA, DIRECT: NEGATIVE
Antinuclear Antibodies Direct: NEGATIVE

## 2017-05-25 LAB — RHEUMATOID FACTOR, QL: Rheumatoid factor: <10 IU/mL (ref 0.0–13.9)

## 2017-05-25 LAB — SED RATE (ESR): Sed rate (ESR): 62 mm/hr — ABNORMAL HIGH (ref 0–40)

## 2017-05-25 MED ORDER — TRAMADOL 50 MG TAB
50 mg | ORAL_TABLET | Freq: Three times a day (TID) | ORAL | 0 refills | Status: DC | PRN
Start: 2017-05-25 — End: 2018-03-23

## 2017-05-25 NOTE — Progress Notes (Signed)
I called with results. Increase insulin to 36 units

## 2017-05-30 MED ORDER — METFORMIN SR 500 MG 24 HR TABLET
500 mg | ORAL_TABLET | ORAL | 2 refills | Status: DC
Start: 2017-05-30 — End: 2018-02-26

## 2017-06-09 ENCOUNTER — Encounter: Attending: Surgery | Primary: Internal Medicine

## 2017-06-09 ENCOUNTER — Ambulatory Visit
Admit: 2017-06-09 | Discharge: 2017-06-09 | Payer: PRIVATE HEALTH INSURANCE | Attending: Surgery | Primary: Internal Medicine

## 2017-06-09 DIAGNOSIS — K253 Acute gastric ulcer without hemorrhage or perforation: Secondary | ICD-10-CM

## 2017-06-09 NOTE — Progress Notes (Signed)
1. Have you been to the ER, urgent care clinic since your last visit?  Hospitalized since your last visit? No    2. Have you seen or consulted any other health care providers outside of the [facility] since your last visit?  Include any pap smears or colon screening.  No    Patient states she fell in her parking deck at work, on the steps, and both knees are slightly bleeding. I gave her antibiotic ointment and bandaids per he request.

## 2017-06-09 NOTE — Progress Notes (Signed)
Pt. Is here for follow up of repair of peptic ulcer. She complains of some difficulty swallowing pills but is able to eat without any difficulty. She is still on steroids as she was diagnosed with Polymyalgia Rheumatica.    Visit Vitals   ??? BP 134/84 (BP 1 Location: Left arm, BP Patient Position: Sitting)   ??? Pulse 89   ??? Temp 98.1 ??F (36.7 ??C) (Oral)   ??? Resp 18   ??? Ht 5\' 4"  (1.626 m)   ??? Wt 236 lb (107 kg)   ??? SpO2 98%   ??? BMI 40.51 kg/m2      Gen- Alert in NAD  Abd-S/nt/nd incision healing well without evidence of infection    S/p Repair perforated ulcer  Will get H.pylori breath test and treat as needed  She will return to her GI for Endoscopy  Continue PPI

## 2017-06-15 LAB — H. PYLORI BREATH TEST: H pylori Breath Test: NEGATIVE

## 2017-06-17 ENCOUNTER — Encounter: Attending: Internal Medicine | Primary: Internal Medicine

## 2017-06-22 ENCOUNTER — Encounter

## 2017-06-22 MED ORDER — CARAFATE 100 MG/ML ORAL SUSPENSION
100 mg/mL | ORAL | 0 refills | Status: DC
Start: 2017-06-22 — End: 2017-07-24

## 2017-07-01 ENCOUNTER — Ambulatory Visit: Admit: 2017-07-01 | Payer: PRIVATE HEALTH INSURANCE | Attending: Internal Medicine | Primary: Internal Medicine

## 2017-07-01 DIAGNOSIS — E1159 Type 2 diabetes mellitus with other circulatory complications: Secondary | ICD-10-CM

## 2017-07-01 LAB — AMB POC COMPLETE CBC,AUTOMATED ENTER
ABS. GRANS (POC): 7.7 10*3/uL — AB (ref 1.4–6.5)
ABS. LYMPHS (POC): 1.1 10*3/uL — AB (ref 1.2–3.4)
ABS. MONOS (POC): 0.3 10*3/uL (ref 0.1–0.6)
GRANULOCYTES (POC): 84.3 % — AB (ref 42.2–75.2)
HCT (POC): 37.9 % (ref 35.0–60.0)
HGB (POC): 12.6 g/dL (ref 11–18)
LYMPHOCYTES (POC): 12.5 % — AB (ref 20.5–51.1)
MCH (POC): 29.9 pg (ref 27.0–31.0)
MCHC (POC): 33.3 g/dL (ref 33.0–37.0)
MCV (POC): 89.9 fL (ref 80.0–99.9)
MONOCYTES (POC): 3.2 % (ref 1.7–9.3)
MPV (POC): 6.7 fL — AB (ref 7.8–11)
PLATELET (POC): 217 10*3/uL (ref 150–450)
RBC (POC): 4.21 M/uL (ref 4.00–6.00)
RDW (POC): 15.9 % — AB (ref 11.6–13.7)
WBC (POC): 9.1 10*3/uL (ref 4.5–10.5)

## 2017-07-01 LAB — AMB POC HEMOGLOBIN A1C: Hemoglobin A1c (POC): 8.4 % — AB (ref 4.8–5.6)

## 2017-07-01 NOTE — Patient Instructions (Signed)
Results for orders placed or performed in visit on 07/01/17   AMB POC HEMOGLOBIN A1C   Result Value Ref Range    Hemoglobin A1c (POC) 8.4 (A) 4.8 - 5.6 %   AMB POC COMPLETE CBC,AUTOMATED ENTER   Result Value Ref Range    WBC (POC) 9.1 4.5 - 10.5 K/uL    LYMPHOCYTES (POC) 12.5 (A) 20.5 - 51.1 %    MONOCYTES (POC) 3.2 1.7 - 9.3 %    GRANULOCYTES (POC) 84.3 (A) 42.2 - 75.2 %    ABS. LYMPHS (POC) 1.1 (A) 1.2 - 3.4 K/uL    ABS. MONOS (POC) 0.3 0.1 - 0.6 10^3/ul    ABS. GRANS (POC) 7.7 (A) 1.4 - 6.5 10^3/ul    RBC (POC) 4.21 4.00 - 6.00 M/uL    HGB (POC) 12.6 11 - 18 g/dL    HCT (POC) 11.937.9 14.735.0 - 60.0 %    MCV (POC) 89.9 80.0 - 99.9 fL    MCH (POC) 29.9 27.0 - 31.0 pg    MCHC (POC) 33.3 33.0 - 37.0 g/dL    RDW (POC) 82.915.9 (A) 56.211.6 - 13.7 %    PLATELET (POC) 217 150 - 450 K/uL    MPV (POC) 6.7 (A) 7.8 - 11 fL     Decrease pred to 20 mg a day for a week then down to 10 mg

## 2017-07-01 NOTE — Progress Notes (Signed)
Dana [name] is a 61 y.o. female and presents with   Chief Complaint   Patient presents with   ??? Follow-up   .  Patient saw gi yesterday.- continues on carafate and pantoprazole    Still doing prednisone twice a day.  Blood sugars running in 200's using 40 units insulin at night.  Having cramping in right leg at night.  Uses tramadol at night.  Tylenol not helping.  Unable to take nsaids.  Also having back pain.- after fall injuring right hip      Current Outpatient Medications   Medication Sig Dispense Refill   ??? CARAFATE 100 mg/mL suspension TAKE 10 ML BY MOUTH FOUR (4) TIMES DAILY FOR 28 DAYS. INDICATIONS: GASTRIC ULCER 1120 mL 0   ??? metFORMIN ER (GLUCOPHAGE XR) 500 mg tablet TAKE 1 TABLET EVERY MORNING AND 2 TABLETS WITH DINNER (Patient taking differently: 2 bid) 270 Tab 2   ??? traMADol (ULTRAM) 50 mg tablet Take 1 Tab by mouth every eight (8) hours as needed for Pain. Max Daily Amount: 150 mg. 30 Tab 0   ??? predniSONE (DELTASONE) 20 mg tablet Take 1 Tab by mouth two (2) times a day. 60 Tab 1   ??? pantoprazole (PROTONIX) 40 mg tablet Take 1 Tab by mouth Daily (before breakfast). 30 Tab 1   ??? simvastatin (ZOCOR) 40 mg tablet TAKE 1 TABLET NIGHTLY 90 Tab 1   ??? lidocaine (LIDODERM) 5 % 1 Patch by TransDERmal route every twelve (12) hours as needed. Apply patch to the affected area for 12 hours a day and remove for 12 hours a day.     ??? sertraline (ZOLOFT) 100 mg tablet TAKE 1 TABLET BY MOUTH DAILY 90 Tab 3   ??? insulin NPH (HUMULIN N NPH INSULIN KWIKPEN) 100 unit/mL (3 mL) inpn INJECT 32 UNITS SUBCUTANEOUSLY AT BEDTIME (Patient taking differently: 40 Units. INJECT 32 UNITS SUBCUTANEOUSLY AT BEDTIME) 30 Adjustable Dose Pre-filled Pen Syringe 2   ??? triamterene-hydroCHLOROthiazide (MAXZIDE) 37.5-25 mg per tablet TAKE 1 TABLET DAILY 90 Tab 2     Allergies   Allergen Reactions   ??? Adhesive Itching   ??? Dolobid [Diflunisal] Itching     Itching,swelling     Past Medical History:   Diagnosis Date    ??? Diabetes (HCC) 01/24/2013   ??? GERD (gastroesophageal reflux disease)    ??? Hives    ??? HTN (hypertension)    ??? Hypercholesterolemia    ??? Perforated gastric ulcer (HCC)      Past Surgical History:   Procedure Laterality Date   ??? ABDOMEN SURGERY PROC UNLISTED  05/12/2017    Lap exploratory perforated posterior ulcer repair by Dr. Amada JupiterShindel    ??? HX HYSTERECTOMY       Family History   Problem Relation Age of Onset   ??? Hypertension Mother    ??? Cancer Father         mesothelioma   ??? Diabetes Father    ??? Hypertension Father      Social History     Tobacco Use   ??? Smoking status: Former Smoker     Packs/day: 1.00     Last attempt to quit: 06/27/2011     Years since quitting: 6.0   ??? Smokeless tobacco: Never Used   Substance Use Topics   ??? Alcohol use: Yes     Alcohol/week: 0.6 - 1.2 oz     Types: 1 - 2 Glasses of wine per week     Comment: Per  month       The patient has a history of falls. A plan of care for falls was documented..  Depression screen pos-treated      Objective:  Visit Vitals  BP 128/80   Ht 5\' 4"  (1.626 m)   Wt 232 lb (105.2 kg)   BMI 39.82 kg/m??     Obese 60 yo wf  In NAD. A&O.  HEENT -- Pupils round.  O/P Clear.  Neck -- Supple. No JVD.  Heart -- RRR. No R/M/G.  Lungs -- CTA.  Abdomen -- Soft. Non-tender. Non-distended. No masses. Bowel sounds present.  Extremities -- No edema. Good pulses, no lesions      Results for orders placed or performed in visit on 07/01/17   AMB POC HEMOGLOBIN A1C   Result Value Ref Range    Hemoglobin A1c (POC) 8.4 (A) 4.8 - 5.6 %   AMB POC COMPLETE CBC,AUTOMATED ENTER   Result Value Ref Range    WBC (POC) 9.1 4.5 - 10.5 K/uL    LYMPHOCYTES (POC) 12.5 (A) 20.5 - 51.1 %    MONOCYTES (POC) 3.2 1.7 - 9.3 %    GRANULOCYTES (POC) 84.3 (A) 42.2 - 75.2 %    ABS. LYMPHS (POC) 1.1 (A) 1.2 - 3.4 K/uL    ABS. MONOS (POC) 0.3 0.1 - 0.6 10^3/ul    ABS. GRANS (POC) 7.7 (A) 1.4 - 6.5 10^3/ul    RBC (POC) 4.21 4.00 - 6.00 M/uL    HGB (POC) 12.6 11 - 18 g/dL    HCT (POC) 16.1 09.6 - 60.0 %     MCV (POC) 89.9 80.0 - 99.9 fL    MCH (POC) 29.9 27.0 - 31.0 pg    MCHC (POC) 33.3 33.0 - 37.0 g/dL    RDW (POC) 04.5 (A) 40.9 - 13.7 %    PLATELET (POC) 217 150 - 450 K/uL    MPV (POC) 6.7 (A) 7.8 - 11 fL         Assessment/Plan:    ICD-10-CM ICD-9-CM    1. Hypertension complicating diabetes (HCC) E11.59 250.80     I10 401.9    2. Acute gastric ulcer with perforation (HCC) K25.1 531.10 AMB POC COMPLETE CBC,AUTOMATED ENTER   3. Type 2 diabetes mellitus with hyperglycemia, with long-term current use of insulin (HCC) E11.65 250.00 AMB POC HEMOGLOBIN A1C    Z79.4 790.29 COLLECTION VENOUS BLOOD,VENIPUNCTURE     V58.67 METABOLIC PANEL, COMPREHENSIVE   4. Hypercholesterolemia E78.00 272.0    5. Morbid obesity due to excess calories (HCC) E66.01 278.01    6. Arthralgia of both knees M25.561 719.46     M25.562     7. Need for influenza vaccination Z23 V04.81 INFLUENZA VIRUS VACCINE, HIGH DOSE SEASONAL, PRESERVATIVE FREE   8. Pain of right hip joint M25.551 719.45 REFERRAL TO PHYSICAL THERAPY   9. Acute bilateral low back pain without sciatica M54.5 724.2 REFERRAL TO PHYSICAL THERAPY     338.19      Cut pred to 20 mg daily for a week then 10 mg until next visit.  Stop tramadol  Magnesium for leg cramps  Author:  Lake Bells, MD 07/01/2017 2:29 PM

## 2017-07-02 ENCOUNTER — Emergency Department: Admit: 2017-07-02 | Payer: BLUE CROSS/BLUE SHIELD | Primary: Internal Medicine

## 2017-07-02 ENCOUNTER — Inpatient Hospital Stay
Admit: 2017-07-02 | Discharge: 2017-07-14 | Disposition: A | Discharge status: Home / Self Care | Payer: BLUE CROSS/BLUE SHIELD | Attending: Internal Medicine | Admitting: Internal Medicine

## 2017-07-02 DIAGNOSIS — K251 Acute gastric ulcer with perforation: Secondary | ICD-10-CM

## 2017-07-02 LAB — METABOLIC PANEL, COMPREHENSIVE
A-G Ratio: 0.8 — ABNORMAL LOW (ref 1.1–2.2)
A-G Ratio: 1.8 (ref 1.2–2.2)
ALT (SGPT): 23 U/L (ref 12–78)
ALT (SGPT): 14 IU/L (ref 0–32)
AST (SGOT): <3 U/L — ABNORMAL LOW (ref 15–37)
AST (SGOT): 6 IU/L (ref 0–40)
Albumin: 3.1 g/dL — ABNORMAL LOW (ref 3.5–5.0)
Albumin: 3.7 g/dL (ref 3.5–5.5)
Alk. phosphatase: 50 U/L (ref 45–117)
Alk. phosphatase: 47 IU/L (ref 39–117)
Anion gap: 12 mmol/L (ref 5–15)
BUN/Creatinine ratio: 21 — ABNORMAL HIGH (ref 12–20)
BUN/Creatinine ratio: 32 — ABNORMAL HIGH (ref 9–23)
BUN: 19 MG/DL (ref 6–20)
BUN: 24 mg/dL (ref 6–24)
Bilirubin, total: 0.3 MG/DL (ref 0.2–1.0)
Bilirubin, total: 0.4 mg/dL (ref 0.0–1.2)
CO2: 25 mmol/L (ref 21–32)
CO2: 24 mmol/L (ref 20–29)
Calcium: 8.7 MG/DL (ref 8.5–10.1)
Calcium: 8.7 mg/dL (ref 8.7–10.2)
Chloride: 101 mmol/L (ref 97–108)
Chloride: 101 mmol/L (ref 96–106)
Creatinine: 0.91 MG/DL (ref 0.55–1.02)
Creatinine: 0.74 mg/dL (ref 0.57–1.00)
GFR est AA: >60 mL/min/{1.73_m2} (ref >60)
GFR est AA: 103 mL/min/{1.73_m2} (ref >59)
GFR est non-AA: >60 mL/min/{1.73_m2} (ref >60)
GFR est non-AA: 89 mL/min/{1.73_m2} (ref >59)
GLOBULIN, TOTAL: 2.1 g/dL (ref 1.5–4.5)
Globulin: 3.7 g/dL (ref 2.0–4.0)
Glucose: 430 mg/dL — ABNORMAL HIGH (ref 65–100)
Glucose: 473 mg/dL — ABNORMAL HIGH (ref 65–99)
Potassium: 4.1 mmol/L (ref 3.5–5.1)
Potassium: 4.5 mmol/L (ref 3.5–5.2)
Protein, total: 6.8 g/dL (ref 6.4–8.2)
Protein, total: 5.8 g/dL — ABNORMAL LOW (ref 6.0–8.5)
Sodium: 138 mmol/L (ref 136–145)
Sodium: 139 mmol/L (ref 134–144)

## 2017-07-02 LAB — CBC WITH AUTOMATED DIFF
ABS. BASOPHILS: 0 10*3/uL (ref 0.0–0.1)
ABS. EOSINOPHILS: 0 10*3/uL (ref 0.0–0.4)
ABS. IMM. GRANS.: 0.1 10*3/uL — ABNORMAL HIGH (ref 0.00–0.04)
ABS. LYMPHOCYTES: 1.7 10*3/uL (ref 0.8–3.5)
ABS. MONOCYTES: 0.7 10*3/uL (ref 0.0–1.0)
ABS. NEUTROPHILS: 9 10*3/uL — ABNORMAL HIGH (ref 1.8–8.0)
ABSOLUTE NRBC: 0 10*3/uL (ref 0.00–0.01)
BASOPHILS: 0 % (ref 0–1)
EOSINOPHILS: 0 % (ref 0–7)
HCT: 40.9 % (ref 35.0–47.0)
HGB: 12.9 g/dL (ref 11.5–16.0)
IMMATURE GRANULOCYTES: 1 % — ABNORMAL HIGH (ref 0.0–0.5)
LYMPHOCYTES: 15 % (ref 12–49)
MCH: 29.6 PG (ref 26.0–34.0)
MCHC: 31.5 g/dL (ref 30.0–36.5)
MCV: 93.8 FL (ref 80.0–99.0)
MONOCYTES: 6 % (ref 5–13)
MPV: 10 FL (ref 8.9–12.9)
NEUTROPHILS: 77 % — ABNORMAL HIGH (ref 32–75)
NRBC: 0 PER 100 WBC
PLATELET: 206 10*3/uL (ref 150–400)
RBC: 4.36 M/uL (ref 3.80–5.20)
RDW: 15.9 % — ABNORMAL HIGH (ref 11.5–14.5)
WBC: 11.7 10*3/uL — ABNORMAL HIGH (ref 3.6–11.0)

## 2017-07-02 LAB — EKG, 12 LEAD, INITIAL
Atrial Rate: 74 {beats}/min
Calculated P Axis: 54 degrees
Calculated R Axis: 27 degrees
Calculated T Axis: 30 degrees
Diagnosis: NORMAL
P-R Interval: 148 ms
Q-T Interval: 380 ms
QRS Duration: 74 ms
QTC Calculation (Bezet): 421 ms
Ventricular Rate: 74 {beats}/min

## 2017-07-02 LAB — URINALYSIS W/ RFLX MICROSCOPIC
Bacteria: NEGATIVE /hpf
Bilirubin: NEGATIVE
Blood: NEGATIVE
Glucose: >1000 mg/dL — AB
Ketone: NEGATIVE mg/dL
Leukocyte Esterase: NEGATIVE
Nitrites: NEGATIVE
Protein: NEGATIVE mg/dL
Specific gravity: 1.015 (ref 1.003–1.030)
Urobilinogen: 0.2 EU/dL (ref 0.2–1.0)
pH (UA): 6 (ref 5.0–8.0)

## 2017-07-02 LAB — SAMPLES BEING HELD

## 2017-07-02 LAB — LIPASE: Lipase: 92 U/L (ref 73–393)

## 2017-07-02 LAB — GLUCOSE, POC
Glucose (POC): 150 mg/dL — ABNORMAL HIGH (ref 65–100)
Glucose (POC): 148 mg/dL — ABNORMAL HIGH (ref 65–100)
Glucose (POC): 148 mg/dL — ABNORMAL HIGH (ref 65–100)
Glucose (POC): 178 mg/dL — ABNORMAL HIGH (ref 65–100)

## 2017-07-02 LAB — POC LACTIC ACID: Lactic Acid (POC): 2 mmol/L (ref 0.4–2.0)

## 2017-07-02 LAB — TROPONIN I: Troponin-I, Qt.: <0.05 ng/mL (ref <0.05)

## 2017-07-02 LAB — EKG 12-LEAD
Atrial Rate: 74 {beats}/min
Diagnosis: NORMAL
P Axis: 54 degrees
P-R Interval: 148 ms
Q-T Interval: 380 ms
QRS Duration: 74 ms
QTc Calculation (Bazett): 421 ms
R Axis: 27 degrees
T Axis: 30 degrees
Ventricular Rate: 74 {beats}/min

## 2017-07-02 MED ORDER — PIPERACILLIN-TAZOBACTAM 3.375 GRAM IV SOLR
3.375 gram | INTRAVENOUS | Status: AC
Start: 2017-07-02 — End: 2017-07-02
  Administered 2017-07-02: 09:00:00 via INTRAVENOUS

## 2017-07-02 MED ORDER — SODIUM CHLORIDE 0.9% BOLUS IV
0.9 % | Freq: Once | INTRAVENOUS | Status: AC
Start: 2017-07-02 — End: 2017-07-02
  Administered 2017-07-02: 07:00:00 via INTRAVENOUS

## 2017-07-02 MED ORDER — SODIUM CHLORIDE 0.9 % IV
INTRAVENOUS | Status: DC
Start: 2017-07-02 — End: 2017-07-03
  Administered 2017-07-02 (×2): via INTRAVENOUS

## 2017-07-02 MED ORDER — HYDROMORPHONE 2 MG/ML INJECTION SOLUTION
2 mg/mL | INTRAMUSCULAR | Status: DC | PRN
Start: 2017-07-02 — End: 2017-07-04
  Administered 2017-07-02 – 2017-07-04 (×12): via INTRAVENOUS

## 2017-07-02 MED ORDER — SODIUM CHLORIDE 0.9 % IJ SYRG
INTRAMUSCULAR | Status: DC | PRN
Start: 2017-07-02 — End: 2017-07-14
  Administered 2017-07-13: 14:00:00 via INTRAVENOUS

## 2017-07-02 MED ORDER — INSULIN NPH HUMAN RECOMB 100 UNIT/ML INJECTION
100 unit/mL | Freq: Every evening | SUBCUTANEOUS | Status: DC
Start: 2017-07-02 — End: 2017-07-02

## 2017-07-02 MED ORDER — MORPHINE 2 MG/ML INJECTION
2 mg/mL | Freq: Once | INTRAMUSCULAR | Status: AC
Start: 2017-07-02 — End: 2017-07-02
  Administered 2017-07-02: 07:00:00 via INTRAVENOUS

## 2017-07-02 MED ORDER — HYDRALAZINE 20 MG/ML IJ SOLN
20 mg/mL | Freq: Four times a day (QID) | INTRAMUSCULAR | Status: DC | PRN
Start: 2017-07-02 — End: 2017-07-14

## 2017-07-02 MED ORDER — PIPERACILLIN-TAZOBACTAM 3.375 GRAM IV SOLR
3.375 gram | Freq: Three times a day (TID) | INTRAVENOUS | Status: DC
Start: 2017-07-02 — End: 2017-07-13
  Administered 2017-07-02 – 2017-07-13 (×33): via INTRAVENOUS

## 2017-07-02 MED ORDER — SODIUM CHLORIDE 0.9 % IJ SYRG
Freq: Three times a day (TID) | INTRAMUSCULAR | Status: DC
Start: 2017-07-02 — End: 2017-07-14
  Administered 2017-07-02 – 2017-07-14 (×38): via INTRAVENOUS

## 2017-07-02 MED ORDER — SODIUM CHLORIDE 0.9 % IV
Freq: Once | INTRAVENOUS | Status: AC
Start: 2017-07-02 — End: 2017-07-02
  Administered 2017-07-02: 07:00:00 via INTRAVENOUS

## 2017-07-02 MED ORDER — SIMVASTATIN 20 MG TAB
20 mg | Freq: Every evening | ORAL | Status: DC
Start: 2017-07-02 — End: 2017-07-14
  Administered 2017-07-03 – 2017-07-14 (×12): via ORAL

## 2017-07-02 MED ORDER — CLONIDINE 0.1 MG TAB
0.1 mg | ORAL | Status: DC | PRN
Start: 2017-07-02 — End: 2017-07-14

## 2017-07-02 MED ORDER — FENTANYL CITRATE (PF) 50 MCG/ML IJ SOLN
50 mcg/mL | INTRAMUSCULAR | Status: AC
Start: 2017-07-02 — End: 2017-07-02
  Administered 2017-07-02: 06:00:00 via INTRAVENOUS

## 2017-07-02 MED ORDER — GLUCAGON 1 MG INJECTION
1 mg | INTRAMUSCULAR | Status: DC | PRN
Start: 2017-07-02 — End: 2017-07-14

## 2017-07-02 MED ORDER — ONDANSETRON (PF) 4 MG/2 ML INJECTION
4 mg/2 mL | INTRAMUSCULAR | Status: AC
Start: 2017-07-02 — End: 2017-07-02
  Administered 2017-07-02: 06:00:00 via INTRAVENOUS

## 2017-07-02 MED ORDER — HYDROMORPHONE 2 MG/ML INJECTION SOLUTION
2 mg/mL | INTRAMUSCULAR | Status: DC | PRN
Start: 2017-07-02 — End: 2017-07-02
  Administered 2017-07-02: 13:00:00 via INTRAVENOUS

## 2017-07-02 MED ORDER — PANTOPRAZOLE 40 MG IV SOLR
40 mg | Freq: Two times a day (BID) | INTRAVENOUS | Status: DC
Start: 2017-07-02 — End: 2017-07-13
  Administered 2017-07-02 – 2017-07-13 (×23): via INTRAVENOUS

## 2017-07-02 MED ORDER — SODIUM CHLORIDE 0.9% BOLUS IV
0.9 % | INTRAVENOUS | Status: AC
Start: 2017-07-02 — End: 2017-07-02
  Administered 2017-07-02: 10:00:00 via INTRAVENOUS

## 2017-07-02 MED ORDER — IOPAMIDOL 76 % IV SOLN
370 mg iodine /mL (76 %) | Freq: Once | INTRAVENOUS | Status: AC
Start: 2017-07-02 — End: 2017-07-02
  Administered 2017-07-02: 07:00:00 via INTRAVENOUS

## 2017-07-02 MED ORDER — ONDANSETRON (PF) 4 MG/2 ML INJECTION
4 mg/2 mL | INTRAMUSCULAR | Status: DC | PRN
Start: 2017-07-02 — End: 2017-07-14
  Administered 2017-07-02 – 2017-07-10 (×12): via INTRAVENOUS

## 2017-07-02 MED ORDER — DIPHENHYDRAMINE 25 MG CAP
25 mg | ORAL | Status: AC
Start: 2017-07-02 — End: 2017-07-02
  Administered 2017-07-02: 08:00:00 via ORAL

## 2017-07-02 MED ORDER — SODIUM CHLORIDE 0.9 % IJ SYRG
Freq: Once | INTRAMUSCULAR | Status: AC
Start: 2017-07-02 — End: 2017-07-02
  Administered 2017-07-02: 07:00:00 via INTRAVENOUS

## 2017-07-02 MED ORDER — IOHEXOL 350 MG IODINE/ML INTRAVENOUS SOLUTION
350 mg iodine/mL | Freq: Once | INTRAVENOUS | Status: AC
Start: 2017-07-02 — End: 2017-07-02
  Administered 2017-07-02: 08:00:00 via ORAL

## 2017-07-02 MED ORDER — HYDROMORPHONE 2 MG/ML INJECTION SOLUTION
2 mg/mL | Freq: Once | INTRAMUSCULAR | Status: AC
Start: 2017-07-02 — End: 2017-07-02
  Administered 2017-07-02: 08:00:00 via INTRAVENOUS

## 2017-07-02 MED ORDER — INSULIN REGULAR HUMAN 100 UNIT/ML INJECTION
100 unit/mL | INTRAMUSCULAR | Status: AC
Start: 2017-07-02 — End: 2017-07-02
  Administered 2017-07-02: 07:00:00 via INTRAVENOUS

## 2017-07-02 MED ORDER — FENTANYL CITRATE (PF) 50 MCG/ML IJ SOLN
50 mcg/mL | INTRAMUSCULAR | Status: DC | PRN
Start: 2017-07-02 — End: 2017-07-02

## 2017-07-02 MED ORDER — ACETAMINOPHEN 325 MG TABLET
325 mg | ORAL | Status: DC | PRN
Start: 2017-07-02 — End: 2017-07-14
  Administered 2017-07-02 – 2017-07-14 (×11): via ORAL

## 2017-07-02 MED ORDER — INSULIN NPH HUMAN RECOMB 100 UNIT/ML INJECTION
100 unit/mL | Freq: Every evening | SUBCUTANEOUS | Status: DC
Start: 2017-07-02 — End: 2017-07-11
  Administered 2017-07-03 – 2017-07-11 (×8): via SUBCUTANEOUS

## 2017-07-02 MED ORDER — DEXTROSE 50% IN WATER (D50W) IV SYRG
INTRAVENOUS | Status: DC | PRN
Start: 2017-07-02 — End: 2017-07-14

## 2017-07-02 MED ORDER — SERTRALINE 50 MG TAB
50 mg | Freq: Every day | ORAL | Status: DC
Start: 2017-07-02 — End: 2017-07-14
  Administered 2017-07-02 – 2017-07-14 (×13): via ORAL

## 2017-07-02 MED ORDER — INSULIN LISPRO 100 UNIT/ML INJECTION
100 unit/mL | Freq: Four times a day (QID) | SUBCUTANEOUS | Status: DC
Start: 2017-07-02 — End: 2017-07-14
  Administered 2017-07-02 – 2017-07-14 (×35): via SUBCUTANEOUS

## 2017-07-02 MED ORDER — GLUCOSE 4 GRAM CHEWABLE TAB
4 gram | ORAL | Status: DC | PRN
Start: 2017-07-02 — End: 2017-07-14

## 2017-07-02 MED ORDER — PREDNISONE 10 MG TAB
10 mg | Freq: Two times a day (BID) | ORAL | Status: DC
Start: 2017-07-02 — End: 2017-07-05
  Administered 2017-07-02 – 2017-07-04 (×6): via ORAL

## 2017-07-02 MED FILL — HYDROMORPHONE 2 MG/ML INJECTION SOLUTION: 2 mg/mL | INTRAMUSCULAR | Qty: 1

## 2017-07-02 MED FILL — INSULIN LISPRO 100 UNIT/ML INJECTION: 100 unit/mL | SUBCUTANEOUS | Qty: 1

## 2017-07-02 MED FILL — ACETAMINOPHEN 325 MG TABLET: 325 mg | ORAL | Qty: 2

## 2017-07-02 MED FILL — PIPERACILLIN-TAZOBACTAM 3.375 GRAM IV SOLR: 3.375 gram | INTRAVENOUS | Qty: 3.38

## 2017-07-02 MED FILL — ONDANSETRON (PF) 4 MG/2 ML INJECTION: 4 mg/2 mL | INTRAMUSCULAR | Qty: 2

## 2017-07-02 MED FILL — PROTONIX 40 MG INTRAVENOUS SOLUTION: 40 mg | INTRAVENOUS | Qty: 40

## 2017-07-02 MED FILL — ISOVUE-370  76 % INTRAVENOUS SOLUTION: 370 mg iodine /mL (76 %) | INTRAVENOUS | Qty: 100

## 2017-07-02 MED FILL — NORMAL SALINE FLUSH 0.9 % INJECTION SYRINGE: INTRAMUSCULAR | Qty: 10

## 2017-07-02 MED FILL — PREDNISONE 10 MG TAB: 10 mg | ORAL | Qty: 2

## 2017-07-02 MED FILL — OMNIPAQUE 350 MG IODINE/ML INTRAVENOUS SOLUTION: 350 mg iodine/mL | INTRAVENOUS | Qty: 100

## 2017-07-02 MED FILL — SODIUM CHLORIDE 0.9 % IV: INTRAVENOUS | Qty: 1000

## 2017-07-02 MED FILL — SODIUM CHLORIDE 0.9 % IV: INTRAVENOUS | Qty: 100

## 2017-07-02 MED FILL — MORPHINE 2 MG/ML INJECTION: 2 mg/mL | INTRAMUSCULAR | Qty: 2

## 2017-07-02 MED FILL — SERTRALINE 50 MG TAB: 50 mg | ORAL | Qty: 2

## 2017-07-02 MED FILL — INSULIN REGULAR HUMAN 100 UNIT/ML INJECTION: 100 unit/mL | INTRAMUSCULAR | Qty: 1

## 2017-07-02 MED FILL — DIPHENHYDRAMINE 25 MG CAP: 25 mg | ORAL | Qty: 1

## 2017-07-02 MED FILL — FENTANYL CITRATE (PF) 50 MCG/ML IJ SOLN: 50 mcg/mL | INTRAMUSCULAR | Qty: 2

## 2017-07-02 NOTE — Progress Notes (Signed)
Primary Nurse Fernand Parkinsary Butcher, RN and Silva BandyKristi, RN performed a dual skin assessment on this patient No impairment noted  Braden score is 22

## 2017-07-02 NOTE — Consults (Signed)
Surgery Consult    Subjective:      Dana [name] is a 61 y.o. female who presents complaining of abdomainl pain the pain began in the RUQ and then moved lower and across to the midline of her abdomen. This began this afternoon. She was feeling fine prior to this and had seen her PCP earlier in the day. She underwent a repair of a perforated ulcer in early September and had been doing well from that standpoint. She is scheduled to undergo an EGD later this week as follow up for the ulcer repair. She is still on steroids for but is supposed to be coming down off of them.         Patient Active Problem List    Diagnosis Date Noted   ??? Arthralgia of both knees 05/25/2017   ??? Acute gastric ulcer with perforation ([place]) 05/13/2017   ??? Hives 05/11/2017   ??? Hypertension complicating diabetes ([place]) 05/25/2016   ??? Morbid obesity due to excess calories ([place]) 05/25/2016   ??? Sleep apnea 02/26/2013   ??? Diabetes ([place]) 01/24/2013   ??? Hypercholesterolemia    ??? HTN (hypertension)      Past Medical History:   Diagnosis Date   ??? Diabetes ([place]) 01/24/2013   ??? GERD (gastroesophageal reflux disease)    ??? Hives    ??? HTN (hypertension)    ??? Hypercholesterolemia    ??? Perforated gastric ulcer ([place])       Past Surgical History:   Procedure Laterality Date   ??? ABDOMEN SURGERY PROC UNLISTED  05/12/2017    Lap exploratory perforated posterior ulcer repair by Dr. Claudie Leach    ??? HX HYSTERECTOMY        Social History     Tobacco Use   ??? Smoking status: Former Smoker     Packs/day: 1.00     Last attempt to quit: 06/27/2011     Years since quitting: 6.0   ??? Smokeless tobacco: Never Used   Substance Use Topics   ??? Alcohol use: Yes     Alcohol/week: 0.6 - 1.2 oz     Types: 1 - 2 Glasses of wine per week     Comment: Per month       Family History   Problem Relation Age of Onset   ??? Hypertension Mother    ??? Cancer Father         mesothelioma   ??? Diabetes Father    ??? Hypertension Father       Current Facility-Administered Medications    Medication Dose Route Frequency   ??? sodium chloride 0.9 % bolus infusion 1,000 mL  1,000 mL IntraVENous NOW     Current Outpatient Medications   Medication Sig   ??? CARAFATE 100 mg/mL suspension TAKE 10 ML BY MOUTH FOUR (4) TIMES DAILY FOR 28 DAYS. INDICATIONS: GASTRIC ULCER   ??? metFORMIN ER (GLUCOPHAGE XR) 500 mg tablet TAKE 1 TABLET EVERY MORNING AND 2 TABLETS WITH DINNER (Patient taking differently: 2 bid)   ??? traMADol (ULTRAM) 50 mg tablet Take 1 Tab by mouth every eight (8) hours as needed for Pain. Max Daily Amount: 150 mg.   ??? predniSONE (DELTASONE) 20 mg tablet Take 1 Tab by mouth two (2) times a day.   ??? pantoprazole (PROTONIX) 40 mg tablet Take 1 Tab by mouth Daily (before breakfast).   ??? simvastatin (ZOCOR) 40 mg tablet TAKE 1 TABLET NIGHTLY   ??? lidocaine (LIDODERM) 5 % 1 Patch by TransDERmal route every  twelve (12) hours as needed. Apply patch to the affected area for 12 hours a day and remove for 12 hours a day.   ??? sertraline (ZOLOFT) 100 mg tablet TAKE 1 TABLET BY MOUTH DAILY   ??? insulin NPH (HUMULIN N NPH INSULIN KWIKPEN) 100 unit/mL (3 mL) inpn INJECT 32 UNITS SUBCUTANEOUSLY AT BEDTIME (Patient taking differently: 40 Units. INJECT 32 UNITS SUBCUTANEOUSLY AT BEDTIME)   ??? triamterene-hydroCHLOROthiazide (MAXZIDE) 37.5-25 mg per tablet TAKE 1 TABLET DAILY      Allergies   Allergen Reactions   ??? Adhesive Itching   ??? Dolobid [Diflunisal] Itching     Itching,swelling       Review of Systems:    Pertinent items are noted in the History of Present Illness.    Objective:        Visit Vitals  BP (!) 185/105 (BP 1 Location: Left arm, BP Patient Position: At rest)   Pulse 87   Temp 98.5 ??F (36.9 ??C)   Resp 18   Ht 5' 4"  (1.626 m)   Wt 238 lb 5.1 oz (108.1 kg)   SpO2 99%   BMI 40.91 kg/m??       Physical Exam:  GENERAL: alert, cooperative, no distress, appears stated age, EYE: negative, THROAT & NECK: normal, LUNG: clear to auscultation bilaterally, HEART: regular rate and rhythm, ABDOMEN: soft with mild diffuse  tenderness. reight and mid abdomen. Incision healing well. , EXTREMITIES:  no edema, SKIN: Normal., NEUROLOGIC: negative, PSYCH: non focal    Imaging:  images and reports reviewed  CT- Inflammatory stranding and few locules of gas in the right upper quadrant,  extending to the gastric antrum/duodenal bulb. Patient is status post repair of  perforated ulcer in this region last month. Thus, findings could be due to  evolving postoperative changes versus acute reperforation. No drainable  Collection.    Due to the above findings I requested a stat UGI  This was reviewed with radiology    UGI-   No extravasation of contrast at the site of recent gastric ulcer repair to  suggest reperforation.  Lab/Data Review:  All lab results for the last 24 hours reviewed.    Recent Results (from the past 24 hour(s))   AMB POC HEMOGLOBIN A1C    Collection Time: 07/01/17  2:50 PM   Result Value Ref Range    Hemoglobin A1c (POC) 8.4 (A) 4.8 - 5.6 %   AMB POC COMPLETE CBC,AUTOMATED ENTER    Collection Time: 07/01/17  2:50 PM   Result Value Ref Range    WBC (POC) 9.1 4.5 - 10.5 K/uL    LYMPHOCYTES (POC) 12.5 (A) 20.5 - 51.1 %    MONOCYTES (POC) 3.2 1.7 - 9.3 %    GRANULOCYTES (POC) 84.3 (A) 42.2 - 75.2 %    ABS. LYMPHS (POC) 1.1 (A) 1.2 - 3.4 K/uL    ABS. MONOS (POC) 0.3 0.1 - 0.6 10^3/ul    ABS. GRANS (POC) 7.7 (A) 1.4 - 6.5 10^3/ul    RBC (POC) 4.21 4.00 - 6.00 M/uL    HGB (POC) 12.6 11 - 18 g/dL    HCT (POC) 37.9 35.0 - 60.0 %    MCV (POC) 89.9 80.0 - 99.9 fL    MCH (POC) 29.9 27.0 - 31.0 pg    MCHC (POC) 33.3 33.0 - 37.0 g/dL    RDW (POC) 15.9 (A) 11.6 - 13.7 %    PLATELET (POC) 217 150 - 450 K/uL    MPV (POC) 6.7 (  A) 7.8 - 11 fL   CBC WITH AUTOMATED DIFF    Collection Time: 07/02/17  1:46 AM   Result Value Ref Range    WBC 11.7 (H) 3.6 - 11.0 K/uL    RBC 4.36 3.80 - 5.20 M/uL    HGB 12.9 11.5 - 16.0 g/dL    HCT 40.9 35.0 - 47.0 %    MCV 93.8 80.0 - 99.0 FL    MCH 29.6 26.0 - 34.0 PG    MCHC 31.5 30.0 - 36.5 g/dL     RDW 15.9 (H) 11.5 - 14.5 %    PLATELET 206 150 - 400 K/uL    MPV 10.0 8.9 - 12.9 FL    NRBC 0.0 0 PER 100 WBC    ABSOLUTE NRBC 0.00 0.00 - 0.01 K/uL    NEUTROPHILS 77 (H) 32 - 75 %    LYMPHOCYTES 15 12 - 49 %    MONOCYTES 6 5 - 13 %    EOSINOPHILS 0 0 - 7 %    BASOPHILS 0 0 - 1 %    IMMATURE GRANULOCYTES 1 (H) 0.0 - 0.5 %    ABS. NEUTROPHILS 9.0 (H) 1.8 - 8.0 K/UL    ABS. LYMPHOCYTES 1.7 0.8 - 3.5 K/UL    ABS. MONOCYTES 0.7 0.0 - 1.0 K/UL    ABS. EOSINOPHILS 0.0 0.0 - 0.4 K/UL    ABS. BASOPHILS 0.0 0.0 - 0.1 K/UL    ABS. IMM. GRANS. 0.1 (H) 0.00 - 0.04 K/UL    DF AUTOMATED     METABOLIC PANEL, COMPREHENSIVE    Collection Time: 07/02/17  1:46 AM   Result Value Ref Range    Sodium 138 136 - 145 mmol/L    Potassium 4.1 3.5 - 5.1 mmol/L    Chloride 101 97 - 108 mmol/L    CO2 25 21 - 32 mmol/L    Anion gap 12 5 - 15 mmol/L    Glucose 430 (H) 65 - 100 mg/dL    BUN 19 6 - 20 MG/DL    Creatinine 0.91 0.55 - 1.02 MG/DL    BUN/Creatinine ratio 21 (H) 12 - 20      GFR est AA >60 >60 ml/min/1.29m    GFR est non-AA >60 >60 ml/min/1.737m   Calcium 8.7 8.5 - 10.1 MG/DL    Bilirubin, total 0.3 0.2 - 1.0 MG/DL    ALT (SGPT) 23 12 - 78 U/L    AST (SGOT) <3 (L) 15 - 37 U/L    Alk. phosphatase 50 45 - 117 U/L    Protein, total 6.8 6.4 - 8.2 g/dL    Albumin 3.1 (L) 3.5 - 5.0 g/dL    Globulin 3.7 2.0 - 4.0 g/dL    A-G Ratio 0.8 (L) 1.1 - 2.2     SAMPLES BEING HELD    Collection Time: 07/02/17  1:46 AM   Result Value Ref Range    SAMPLES BEING HELD RD,BL     COMMENT        Add-on orders for these samples will be processed based on acceptable specimen integrity and analyte stability, which may vary by analyte.   LIPASE    Collection Time: 07/02/17  1:46 AM   Result Value Ref Range    Lipase 92 73 - 393 U/L   TROPONIN I    Collection Time: 07/02/17  1:46 AM   Result Value Ref Range    Troponin-I, Qt. <0.05 <0.05 ng/mL   EKG, 12 LEAD, INITIAL  Collection Time: 07/02/17  1:55 AM   Result Value Ref Range    Ventricular Rate 74 BPM     Atrial Rate 74 BPM    P-R Interval 148 ms    QRS Duration 74 ms    Q-T Interval 380 ms    QTC Calculation (Bezet) 421 ms    Calculated P Axis 54 degrees    Calculated R Axis 27 degrees    Calculated T Axis 30 degrees    Diagnosis       Normal sinus rhythm  When compared with ECG of 12-May-2017 17:01,  Nonspecific T wave abnormality no longer evident in Anterior leads     URINALYSIS W/ RFLX MICROSCOPIC    Collection Time: 07/02/17  3:26 AM   Result Value Ref Range    Color YELLOW/STRAW      Appearance CLEAR CLEAR      Specific gravity 1.015 1.003 - 1.030      pH (UA) 6.0 5.0 - 8.0      Protein NEGATIVE  NEG mg/dL    Glucose >1,000 (A) NEG mg/dL    Ketone NEGATIVE  NEG mg/dL    Bilirubin NEGATIVE  NEG      Blood NEGATIVE  NEG      Urobilinogen 0.2 0.2 - 1.0 EU/dL    Nitrites NEGATIVE  NEG      Leukocyte Esterase NEGATIVE  NEG      WBC 0-4 0 - 4 /hpf    RBC 0-5 0 - 5 /hpf    Epithelial cells FEW FEW /lpf    Bacteria NEGATIVE  NEG /hpf    Hyaline cast 0-2 0 - 5 /lpf   POC LACTIC ACID    Collection Time: 07/02/17  3:53 AM   Result Value Ref Range    Lactic Acid (POC) 2.0 0.4 - 2.0 mmol/L       Assessment:Plan   Abdominal pain. While she does have some stranding and an air pocket at the surgical site the UGI does not show any sign of reperforation or even contained leak. It is possible to have inflammatory changes after surgery.  Does not require operative intervention at this time  Would admit to medical service.  Protonix BID  Would empirically start abx.   Pain control   Following.       Signed By: Merrily Brittle, MD     July 02, 2017

## 2017-07-02 NOTE — ED Provider Notes (Signed)
This is a 61 yo Caucasian female with medical hx remarkable for polymyalgia rheumatica, GERD, hypertension, hypercholesterolemia, and DM presenting ambulatory to the ED with complaint of periumbilical abdominal pain which has now localized to the rt side of the abdomen with associated abdominal distension, worse with movement and breathing. No recognized improving factors. Normal BM yesterday evening.  No associated fever, chest pain, SOB, increased flatus, nausea, vomiting, diarrhea or urinary complaint.              Past Medical History:   Diagnosis Date   ??? Diabetes (HCC) 01/24/2013   ??? GERD (gastroesophageal reflux disease)    ??? Hives    ??? HTN (hypertension)    ??? Hypercholesterolemia    ??? Perforated gastric ulcer (HCC)        Past Surgical History:   Procedure Laterality Date   ??? ABDOMEN SURGERY PROC UNLISTED  05/12/2017    Lap exploratory perforated posterior ulcer repair by Dr. Amada Jupiter    ??? HX HYSTERECTOMY           Family History:   Problem Relation Age of Onset   ??? Hypertension Mother    ??? Cancer Father         mesothelioma   ??? Diabetes Father    ??? Hypertension Father        Social History     Socioeconomic History   ??? Marital status: SINGLE     Spouse name: Not on file   ??? Number of children: Not on file   ??? Years of education: Not on file   ??? Highest education level: Not on file   Social Needs   ??? Financial resource strain: Not on file   ??? Food insecurity - worry: Not on file   ??? Food insecurity - inability: Not on file   ??? Transportation needs - medical: Not on file   ??? Transportation needs - non-medical: Not on file   Occupational History   ??? Not on file   Tobacco Use   ??? Smoking status: Former Smoker     Packs/day: 1.00     Last attempt to quit: 06/27/2011     Years since quitting: 6.0   ??? Smokeless tobacco: Never Used   Substance and Sexual Activity   ??? Alcohol use: Yes     Alcohol/week: 0.6 - 1.2 oz     Types: 1 - 2 Glasses of wine per week     Comment: Per month    ??? Drug use: No    ??? Sexual activity: Yes   Other Topics Concern   ??? Not on file   Social History Narrative   ??? Not on file         ALLERGIES: Adhesive and Dolobid [diflunisal]    Review of Systems   Constitutional: Negative.  Negative for chills, fatigue and fever.   HENT: Negative.  Negative for congestion, ear pain, facial swelling, rhinorrhea, sneezing and sore throat.    Eyes: Negative for pain, discharge and itching.   Respiratory: Negative for cough, chest tightness and shortness of breath.    Cardiovascular: Negative.  Negative for chest pain and leg swelling.   Gastrointestinal: Positive for abdominal distention and abdominal pain. Negative for constipation, diarrhea, nausea and vomiting.   Genitourinary: Negative for difficulty urinating, frequency and urgency.   Musculoskeletal: Negative for arthralgias, back pain, joint swelling, neck pain and neck stiffness.   Skin: Negative for color change and rash.   Neurological: Negative for dizziness, numbness  and headaches.   Psychiatric/Behavioral: Negative for confusion and decreased concentration.   All other systems reviewed and are negative.      Vitals:    07/02/17 0113   BP: (!) 185/105   Pulse: 87   Resp: 18   Temp: 98.5 ??F (36.9 ??C)   SpO2: 99%   Weight: 108.1 kg (238 lb 5.1 oz)   Height: 5\' 4"  (1.626 m)            Physical Exam   Constitutional: She is oriented to person, place, and time. She appears well-developed and well-nourished. She appears distressed (mild).   Caucasian adult female appears uncomfortable   HENT:   Head: Normocephalic and atraumatic.   Right Ear: External ear normal.   Left Ear: External ear normal.   Nose: Nose normal.   Mouth/Throat: Oropharynx is clear and moist. No oropharyngeal exudate.   Eyes: Conjunctivae and EOM are normal. Pupils are equal, round, and reactive to light. Right eye exhibits no discharge. Left eye exhibits no discharge. No scleral icterus.   Neck: Normal range of motion. Neck supple.    Cardiovascular: Normal rate and regular rhythm. Exam reveals no gallop and no friction rub.   No murmur heard.  Pulmonary/Chest: Effort normal and breath sounds normal. She has no wheezes. She has no rales.   Abdominal: Soft. She exhibits no distension. Bowel sounds are decreased. There is tenderness (diffuse, greatest in rt abdomen). There is no rebound and no guarding.   Neurological: She is alert and oriented to person, place, and time. No cranial nerve deficit. Coordination normal.   Skin: Skin is warm and dry. She is not diaphoretic.   Psychiatric: She has a normal mood and affect. Her behavior is normal.   Nursing note and vitals reviewed.       MDM  Number of Diagnoses or Management Options  Diagnosis management comments: 61 yo Caucasian female with complaint of periumbilical pain now localized to RLQ. Concern for SBO vs diverticulitis vs appy vs pancreatitis amongst others    Plan  CBC  CMP  Lipase  UA  EKG  Trop  IVF  zofran           Procedures           Progress note    EKG interpretation:   Rhythm: normal sinus rhythm; and regular . Rate (approx.): 74; Axis: normal; P wave: normal; QRS interval: normal ; ST/T wave: normal; in  Leads. Ammon Muscatello Saunders Glance [place], [place]      Conferred with Dr. Amada JupiterShindel, gen surgery, discussed HPI, PE findings, labs and imaging. He request stat UGI. Thaila Bottoms C [place], [place]        Pt care transferred to Dr. Mindi SlickerAzie at end of shift. Aaliyan Brinkmeier C [place]ard, [place]

## 2017-07-02 NOTE — Other (Signed)
TRANSFER - OUT REPORT:    Verbal report given to Frank(name) on Dana [name]  beinNeva Seatg transferred to 5S(unit) for routine progression of care       Report consisted of patient???s Situation, Background, Assessment and   Recommendations(SBAR).     Information from the following report(s) SBAR and ED Summary was reviewed with the receiving nurse.    Lines:   Peripheral IV 07/02/17 Right Antecubital (Active)   Site Assessment Clean, dry, & intact 07/02/2017  1:46 AM   Phlebitis Assessment 0 07/02/2017  1:46 AM   Infiltration Assessment 0 07/02/2017  1:46 AM   Dressing Status Clean, dry, & intact 07/02/2017  1:46 AM   Dressing Type Topical skin adhesive 07/02/2017  1:46 AM   Hub Color/Line Status Pink;Capped;Flushed;Patent 07/02/2017  1:46 AM   Action Taken Blood drawn 07/02/2017  1:46 AM        Opportunity for questions and clarification was provided.      Patient transported with:   [employer]

## 2017-07-02 NOTE — Progress Notes (Signed)
Bedside and Verbal shift change report given to Frank, RN (oncoming nurse) by Cary, RN (offgoing nurse). Report included the following information SBAR, Kardex and MAR.

## 2017-07-02 NOTE — ED Notes (Signed)
Attempted to call report to 5S

## 2017-07-02 NOTE — ED Triage Notes (Signed)
Triage: Patient arrives ambulatory from home with mid abdominal pain beginning this evening that has now migrated to RLQ. Denies n/v/d, urinary sx. Recent surgery for perforated gastric ulcer.

## 2017-07-02 NOTE — Progress Notes (Signed)
Bedside shift change report given to Cary (oncoming nurse) by Frank (offgoing nurse). Report included the following information SBAR, Kardex, Intake/Output, MAR and Recent Results.

## 2017-07-02 NOTE — H&P (Signed)
HISTORY AND PHYSICAL      PCP: Genene Churn, MD  History source: the patient      CC: abdominal pain      HPI: 61 y.o lady w/ DM, HTN, PMR on chronic steroid therapy, who is s/p ex lap for perforated peptic ulcer repair in 05/2017 who presents with abdominal pain. Symptoms began yesterday afternoon. The pain is primarily in the lower quadrants of her abdomen and spans both sides. It is non-radiating. Sharp in nature. Worsened by movement, alleviated only by IV Dilaudid she received in the ED. No associated nausea, vomiting, diarrhea, bleeding. No fever or bleeding.      PMH/PSH:  Past Medical History:   Diagnosis Date   ??? Diabetes ([place]) 01/24/2013   ??? GERD (gastroesophageal reflux disease)    ??? Hives    ??? HTN (hypertension)    ??? Hypercholesterolemia    ??? Perforated gastric ulcer ([place])      Past Surgical History:   Procedure Laterality Date   ??? ABDOMEN SURGERY PROC UNLISTED  05/12/2017    Lap exploratory perforated posterior ulcer repair by Dr. Claudie Leach    ??? HX HYSTERECTOMY         Home meds:   Prior to Admission medications    Medication Sig Start Date End Date Taking? Authorizing Provider   CARAFATE 100 mg/mL suspension TAKE 10 ML BY MOUTH FOUR (4) TIMES DAILY FOR 28 DAYS. INDICATIONS: GASTRIC ULCER 06/22/17   Marko Stai, MD   metFORMIN ER (GLUCOPHAGE XR) 500 mg tablet TAKE 1 TABLET EVERY MORNING AND 2 TABLETS WITH DINNER  Patient taking differently: 2 bid 05/30/17   Pahle, Candiss Norse, MD   traMADol (ULTRAM) 50 mg tablet Take 1 Tab by mouth every eight (8) hours as needed for Pain. Max Daily Amount: 150 mg. 05/25/17   Genene Churn, MD   predniSONE (DELTASONE) 20 mg tablet Take 1 Tab by mouth two (2) times a day. 05/24/17   Genene Churn, MD   pantoprazole (PROTONIX) 40 mg tablet Take 1 Tab by mouth Daily (before breakfast). 05/19/17   Genene Churn, MD   simvastatin (ZOCOR) 40 mg tablet TAKE 1 TABLET NIGHTLY 05/13/17   Pahle, Candiss Norse, MD   lidocaine (LIDODERM) 5 % 1 Patch by TransDERmal route every twelve (12)  hours as needed. Apply patch to the affected area for 12 hours a day and remove for 12 hours a day.    Provider, Historical   sertraline (ZOLOFT) 100 mg tablet TAKE 1 TABLET BY MOUTH DAILY 04/20/17   Genene Churn, MD   insulin NPH (HUMULIN N NPH INSULIN KWIKPEN) 100 unit/mL (3 mL) inpn INJECT 32 UNITS SUBCUTANEOUSLY AT BEDTIME  Patient taking differently: 40 Units. INJECT 32 UNITS SUBCUTANEOUSLY AT BEDTIME 03/15/17   Genene Churn, MD   triamterene-hydroCHLOROthiazide [facility]) 37.5-25 mg per tablet TAKE 1 TABLET DAILY 01/03/17   Fanny Skates Candiss Norse, MD       Allergies:  Allergies   Allergen Reactions   ??? Adhesive Itching   ??? Dolobid [Diflunisal] Itching     Itching,swelling       FH:  Family History   Problem Relation Age of Onset   ??? Hypertension Mother    ??? Cancer Father         mesothelioma   ??? Diabetes Father    ??? Hypertension Father        SH:  Social History     Tobacco Use   ??? Smoking  status: Former Smoker     Packs/day: 1.00     Last attempt to quit: 06/27/2011     Years since quitting: 6.0   ??? Smokeless tobacco: Never Used   Substance Use Topics   ??? Alcohol use: Yes     Alcohol/week: 0.6 - 1.2 oz     Types: 1 - 2 Glasses of wine per week     Comment: Per month        ROS: A comprehensive review of systems was negative except for that written in the HPI.      PHYSICAL EXAM:  Visit Vitals  BP (!) 185/105 (BP 1 Location: Left arm, BP Patient Position: At rest)   Pulse 87   Temp 98.5 ??F (36.9 ??C)   Resp 18   Ht '5\' 4"'$  (1.626 m)   Wt 108.1 kg (238 lb 5.1 oz)   SpO2 99%   BMI 40.91 kg/m??       Gen: NAD, non-toxic, appears comfortable, obese  HEENT: anicteric sclerae, normal conjunctiva, oropharynx clear, MM dry  Neck: supple, trachea midline, no adenopathy  Heart: RRR, no MRG, no JVD, no peripheral edema  Lungs: CTA b/l, non-labored respirations  Abd: soft, diffusely tender but more so in both lower quadrants to minimal palpation, ND, BS+, ex lap scar appears cdi  Extr: warm  Skin: dry   Neuro: CN II-XII grossly intact, normal speech, moves all extremities  Psych: normal mood, appropriate affect      Labs/Imaging:  Recent Results (from the past 24 hour(s))   AMB POC HEMOGLOBIN A1C    Collection Time: 07/01/17  2:50 PM   Result Value Ref Range    Hemoglobin A1c (POC) 8.4 (A) 4.8 - 5.6 %   AMB POC COMPLETE CBC,AUTOMATED ENTER    Collection Time: 07/01/17  2:50 PM   Result Value Ref Range    WBC (POC) 9.1 4.5 - 10.5 K/uL    LYMPHOCYTES (POC) 12.5 (A) 20.5 - 51.1 %    MONOCYTES (POC) 3.2 1.7 - 9.3 %    GRANULOCYTES (POC) 84.3 (A) 42.2 - 75.2 %    ABS. LYMPHS (POC) 1.1 (A) 1.2 - 3.4 K/uL    ABS. MONOS (POC) 0.3 0.1 - 0.6 10^3/ul    ABS. GRANS (POC) 7.7 (A) 1.4 - 6.5 10^3/ul    RBC (POC) 4.21 4.00 - 6.00 M/uL    HGB (POC) 12.6 11 - 18 g/dL    HCT (POC) 37.9 35.0 - 60.0 %    MCV (POC) 89.9 80.0 - 99.9 fL    MCH (POC) 29.9 27.0 - 31.0 pg    MCHC (POC) 33.3 33.0 - 37.0 g/dL    RDW (POC) 15.9 (A) 11.6 - 13.7 %    PLATELET (POC) 217 150 - 450 K/uL    MPV (POC) 6.7 (A) 7.8 - 11 fL   CBC WITH AUTOMATED DIFF    Collection Time: 07/02/17  1:46 AM   Result Value Ref Range    WBC 11.7 (H) 3.6 - 11.0 K/uL    RBC 4.36 3.80 - 5.20 M/uL    HGB 12.9 11.5 - 16.0 g/dL    HCT 40.9 35.0 - 47.0 %    MCV 93.8 80.0 - 99.0 FL    MCH 29.6 26.0 - 34.0 PG    MCHC 31.5 30.0 - 36.5 g/dL    RDW 15.9 (H) 11.5 - 14.5 %    PLATELET 206 150 - 400 K/uL    MPV 10.0 8.9 - 12.9  FL    NRBC 0.0 0 PER 100 WBC    ABSOLUTE NRBC 0.00 0.00 - 0.01 K/uL    NEUTROPHILS 77 (H) 32 - 75 %    LYMPHOCYTES 15 12 - 49 %    MONOCYTES 6 5 - 13 %    EOSINOPHILS 0 0 - 7 %    BASOPHILS 0 0 - 1 %    IMMATURE GRANULOCYTES 1 (H) 0.0 - 0.5 %    ABS. NEUTROPHILS 9.0 (H) 1.8 - 8.0 K/UL    ABS. LYMPHOCYTES 1.7 0.8 - 3.5 K/UL    ABS. MONOCYTES 0.7 0.0 - 1.0 K/UL    ABS. EOSINOPHILS 0.0 0.0 - 0.4 K/UL    ABS. BASOPHILS 0.0 0.0 - 0.1 K/UL    ABS. IMM. GRANS. 0.1 (H) 0.00 - 0.04 K/UL    DF AUTOMATED     METABOLIC PANEL, COMPREHENSIVE    Collection Time: 07/02/17  1:46 AM    Result Value Ref Range    Sodium 138 136 - 145 mmol/L    Potassium 4.1 3.5 - 5.1 mmol/L    Chloride 101 97 - 108 mmol/L    CO2 25 21 - 32 mmol/L    Anion gap 12 5 - 15 mmol/L    Glucose 430 (H) 65 - 100 mg/dL    BUN 19 6 - 20 MG/DL    Creatinine 0.91 0.55 - 1.02 MG/DL    BUN/Creatinine ratio 21 (H) 12 - 20      GFR est AA >60 >60 ml/min/1.60m    GFR est non-AA >60 >60 ml/min/1.723m   Calcium 8.7 8.5 - 10.1 MG/DL    Bilirubin, total 0.3 0.2 - 1.0 MG/DL    ALT (SGPT) 23 12 - 78 U/L    AST (SGOT) <3 (L) 15 - 37 U/L    Alk. phosphatase 50 45 - 117 U/L    Protein, total 6.8 6.4 - 8.2 g/dL    Albumin 3.1 (L) 3.5 - 5.0 g/dL    Globulin 3.7 2.0 - 4.0 g/dL    A-G Ratio 0.8 (L) 1.1 - 2.2     SAMPLES BEING HELD    Collection Time: 07/02/17  1:46 AM   Result Value Ref Range    SAMPLES BEING HELD RD,BL     COMMENT        Add-on orders for these samples will be processed based on acceptable specimen integrity and analyte stability, which may vary by analyte.   LIPASE    Collection Time: 07/02/17  1:46 AM   Result Value Ref Range    Lipase 92 73 - 393 U/L   TROPONIN I    Collection Time: 07/02/17  1:46 AM   Result Value Ref Range    Troponin-I, Qt. <0.05 <0.05 ng/mL   EKG, 12 LEAD, INITIAL    Collection Time: 07/02/17  1:55 AM   Result Value Ref Range    Ventricular Rate 74 BPM    Atrial Rate 74 BPM    P-R Interval 148 ms    QRS Duration 74 ms    Q-T Interval 380 ms    QTC Calculation (Bezet) 421 ms    Calculated P Axis 54 degrees    Calculated R Axis 27 degrees    Calculated T Axis 30 degrees    Diagnosis       Normal sinus rhythm  When compared with ECG of 12-May-2017 17:01,  Nonspecific T wave abnormality no longer evident in Anterior leads  URINALYSIS W/ RFLX MICROSCOPIC    Collection Time: 07/02/17  3:26 AM   Result Value Ref Range    Color YELLOW/STRAW      Appearance CLEAR CLEAR      Specific gravity 1.015 1.003 - 1.030      pH (UA) 6.0 5.0 - 8.0      Protein NEGATIVE  NEG mg/dL    Glucose >1,000 (A) NEG mg/dL     Ketone NEGATIVE  NEG mg/dL    Bilirubin NEGATIVE  NEG      Blood NEGATIVE  NEG      Urobilinogen 0.2 0.2 - 1.0 EU/dL    Nitrites NEGATIVE  NEG      Leukocyte Esterase NEGATIVE  NEG      WBC 0-4 0 - 4 /hpf    RBC 0-5 0 - 5 /hpf    Epithelial cells FEW FEW /lpf    Bacteria NEGATIVE  NEG /hpf    Hyaline cast 0-2 0 - 5 /lpf   POC LACTIC ACID    Collection Time: 07/02/17  3:53 AM   Result Value Ref Range    Lactic Acid (POC) 2.0 0.4 - 2.0 mmol/L   GLUCOSE, POC    Collection Time: 07/02/17  5:34 AM   Result Value Ref Range    Glucose (POC) 150 (H) 65 - 100 mg/dL    Performed by Regenia Skeeter (PCT)        Recent Labs     07/02/17  0146   WBC 11.7*   HGB 12.9   HCT 40.9   PLT 206     Recent Labs     07/02/17  0146   NA 138   K 4.1   CL 101   CO2 25   BUN 19   CREA 0.91   GLU 430*   CA 8.7     Recent Labs     07/02/17  0146   SGOT <3*   ALT 23   AP 50   TBILI 0.3   TP 6.8   ALB 3.1*   GLOB 3.7   LPSE 92       Recent Labs     07/02/17  0146   TROIQ <0.05       No results for input(s): INR, PTP, APTT in the last 72 hours.    No lab exists for component: INREXT     No results for input(s): PH, PCO2, PO2 in the last 72 hours.      Xr Upper Gi Series W Kub    Result Date: 07/02/2017  IMPRESSION: No extravasation of contrast at the site of recent gastric ulcer repair to suggest reperforation. FLUOROSCOPY 5 MINUTES, 41 IMAGES. Findings reviewed with Dr. Donnie Coffin upon completion of the study.     Ct Abd Pelv W Cont    Result Date: 07/02/2017  IMPRESSION: Inflammatory stranding and few locules of gas in the right upper quadrant, extending to the gastric antrum/duodenal bulb. Patient is status post repair of perforated ulcer in this region last month. Thus, findings could be due to evolving postoperative changes versus acute reperforation. No drainable collection.          Assessment & Plan:     Abdominal pain: (POA) unclear etiology. Imaging shows stranding at the previous surgical site, but perforation ruled out with UGI and this may  just be a non-related finding.  -admit for pain control  -IV fluids  -NPO for now  -gen surgery consulted d/w Dr. Claudie Leach  -  empiric IV abx and PPI per surgery recs    Type 2 DM w/ hyperglycemia:  -continue NPH  -SSI and POC checks  -monitor while NPO    Uncontrolled HTN:  -PRN IV hydralazine  -likely worsened by pain    PMR on steroids    DVT ppx: SCDs  Code status: full  Disposition: TBD    Signed By: Wandra Feinstein, MD     July 02, 2017

## 2017-07-02 NOTE — Progress Notes (Signed)
Events noted.  Pt still with abd pain/tenderness.  Cont PRN Dilaudid and empiric abx.  Surgery following.  She is NPO, so decreasing HS NPH (still giving her some, as her BS's remain up, and she is on prednisone).

## 2017-07-02 NOTE — Progress Notes (Signed)
Problem: Diabetes Self-Management  Goal: *Monitoring blood glucose, interpreting and using results  Identify recommended blood glucose targets  and personal targets.  Outcome: Progressing Towards Goal  Patient on a sliding scale; having blood sugars checked 4 times per day;

## 2017-07-02 NOTE — Consults (Signed)
Surgery Consult    Subjective:      Dana [name] is a 61 y.o. female who presents complaining of abdomainl pain the pain began in the RUQ and then moved lower and across to the midline of her abdomen. This began this afternoon. She was feeling fine prior to this and had seen her PCP earlier in the day. She underwent a repair of a perforated ulcer in early September and had been doing well from that standpoint. She is scheduled to undergo an EGD later this week as follow up for the ulcer repair. She is still on steroids for but is supposed to be coming down off of them.         Patient Active Problem List    Diagnosis Date Noted   ??? Arthralgia of both knees 05/25/2017   ??? Acute gastric ulcer with perforation ([place]) 05/13/2017   ??? Hives 05/11/2017   ??? Hypertension complicating diabetes ([place]) 05/25/2016   ??? Morbid obesity due to excess calories ([place]) 05/25/2016   ??? Sleep apnea 02/26/2013   ??? Diabetes ([place]) 01/24/2013   ??? Hypercholesterolemia    ??? HTN (hypertension)      Past Medical History:   Diagnosis Date   ??? Diabetes ([place]) 01/24/2013   ??? GERD (gastroesophageal reflux disease)    ??? Hives    ??? HTN (hypertension)    ??? Hypercholesterolemia    ??? Perforated gastric ulcer ([place])       Past Surgical History:   Procedure Laterality Date   ??? ABDOMEN SURGERY PROC UNLISTED  05/12/2017    Lap exploratory perforated posterior ulcer repair by Dr. Claudie Leach    ??? HX HYSTERECTOMY        Social History     Tobacco Use   ??? Smoking status: Former Smoker     Packs/day: 1.00     Last attempt to quit: 06/27/2011     Years since quitting: 6.0   ??? Smokeless tobacco: Never Used   Substance Use Topics   ??? Alcohol use: Yes     Alcohol/week: 0.6 - 1.2 oz     Types: 1 - 2 Glasses of wine per week     Comment: Per month       Family History   Problem Relation Age of Onset   ??? Hypertension Mother    ??? Cancer Father         mesothelioma   ??? Diabetes Father    ??? Hypertension Father       Current Facility-Administered Medications   Medication Dose  Route Frequency   ??? sodium chloride 0.9 % bolus infusion 1,000 mL  1,000 mL IntraVENous NOW     Current Outpatient Medications   Medication Sig   ??? CARAFATE 100 mg/mL suspension TAKE 10 ML BY MOUTH FOUR (4) TIMES DAILY FOR 28 DAYS. INDICATIONS: GASTRIC ULCER   ??? metFORMIN ER (GLUCOPHAGE XR) 500 mg tablet TAKE 1 TABLET EVERY MORNING AND 2 TABLETS WITH DINNER (Patient taking differently: 2 bid)   ??? traMADol (ULTRAM) 50 mg tablet Take 1 Tab by mouth every eight (8) hours as needed for Pain. Max Daily Amount: 150 mg.   ??? predniSONE (DELTASONE) 20 mg tablet Take 1 Tab by mouth two (2) times a day.   ??? pantoprazole (PROTONIX) 40 mg tablet Take 1 Tab by mouth Daily (before breakfast).   ??? simvastatin (ZOCOR) 40 mg tablet TAKE 1 TABLET NIGHTLY   ??? lidocaine (LIDODERM) 5 % 1 Patch by TransDERmal route every  twelve (12) hours as needed. Apply patch to the affected area for 12 hours a day and remove for 12 hours a day.   ??? sertraline (ZOLOFT) 100 mg tablet TAKE 1 TABLET BY MOUTH DAILY   ??? insulin NPH (HUMULIN N NPH INSULIN KWIKPEN) 100 unit/mL (3 mL) inpn INJECT 32 UNITS SUBCUTANEOUSLY AT BEDTIME (Patient taking differently: 40 Units. INJECT 32 UNITS SUBCUTANEOUSLY AT BEDTIME)   ??? triamterene-hydroCHLOROthiazide (MAXZIDE) 37.5-25 mg per tablet TAKE 1 TABLET DAILY      Allergies   Allergen Reactions   ??? Adhesive Itching   ??? Dolobid [Diflunisal] Itching     Itching,swelling       Review of Systems:    Pertinent items are noted in the History of Present Illness.    Objective:        Visit Vitals  BP (!) 185/105 (BP 1 Location: Left arm, BP Patient Position: At rest)   Pulse 87   Temp 98.5 ??F (36.9 ??C)   Resp 18   Ht '5\' 4"'$  (1.626 m)   Wt 238 lb 5.1 oz (108.1 kg)   SpO2 99%   BMI 40.91 kg/m??       Physical Exam:  GENERAL: alert, cooperative, no distress, appears stated age, EYE: negative, THROAT & NECK: normal, LUNG: clear to auscultation bilaterally, HEART: regular rate and rhythm, ABDOMEN: soft with mild diffuse tenderness.  reight and mid abdomen. Incision healing well. , EXTREMITIES:  no edema, SKIN: Normal., NEUROLOGIC: negative, PSYCH: non focal    Imaging:  images and reports reviewed  CT- Inflammatory stranding and few locules of gas in the right upper quadrant,  extending to the gastric antrum/duodenal bulb. Patient is status post repair of  perforated ulcer in this region last month. Thus, findings could be due to  evolving postoperative changes versus acute reperforation. No drainable  Collection.    Due to the above findings I requested a stat UGI  This was reviewed with radiology    UGI-   No extravasation of contrast at the site of recent gastric ulcer repair to  suggest reperforation.  Lab/Data Review:  All lab results for the last 24 hours reviewed.    Recent Results (from the past 24 hour(s))   AMB POC HEMOGLOBIN A1C    Collection Time: 07/01/17  2:50 PM   Result Value Ref Range    Hemoglobin A1c (POC) 8.4 (A) 4.8 - 5.6 %   AMB POC COMPLETE CBC,AUTOMATED ENTER    Collection Time: 07/01/17  2:50 PM   Result Value Ref Range    WBC (POC) 9.1 4.5 - 10.5 K/uL    LYMPHOCYTES (POC) 12.5 (A) 20.5 - 51.1 %    MONOCYTES (POC) 3.2 1.7 - 9.3 %    GRANULOCYTES (POC) 84.3 (A) 42.2 - 75.2 %    ABS. LYMPHS (POC) 1.1 (A) 1.2 - 3.4 K/uL    ABS. MONOS (POC) 0.3 0.1 - 0.6 10^3/ul    ABS. GRANS (POC) 7.7 (A) 1.4 - 6.5 10^3/ul    RBC (POC) 4.21 4.00 - 6.00 M/uL    HGB (POC) 12.6 11 - 18 g/dL    HCT (POC) 37.9 35.0 - 60.0 %    MCV (POC) 89.9 80.0 - 99.9 fL    MCH (POC) 29.9 27.0 - 31.0 pg    MCHC (POC) 33.3 33.0 - 37.0 g/dL    RDW (POC) 15.9 (A) 11.6 - 13.7 %    PLATELET (POC) 217 150 - 450 K/uL    MPV (POC) 6.7 (  A) 7.8 - 11 fL   CBC WITH AUTOMATED DIFF    Collection Time: 07/02/17  1:46 AM   Result Value Ref Range    WBC 11.7 (H) 3.6 - 11.0 K/uL    RBC 4.36 3.80 - 5.20 M/uL    HGB 12.9 11.5 - 16.0 g/dL    HCT 40.9 35.0 - 47.0 %    MCV 93.8 80.0 - 99.0 FL    MCH 29.6 26.0 - 34.0 PG    MCHC 31.5 30.0 - 36.5 g/dL    RDW 15.9 (H) 11.5 - 14.5 %     PLATELET 206 150 - 400 K/uL    MPV 10.0 8.9 - 12.9 FL    NRBC 0.0 0 PER 100 WBC    ABSOLUTE NRBC 0.00 0.00 - 0.01 K/uL    NEUTROPHILS 77 (H) 32 - 75 %    LYMPHOCYTES 15 12 - 49 %    MONOCYTES 6 5 - 13 %    EOSINOPHILS 0 0 - 7 %    BASOPHILS 0 0 - 1 %    IMMATURE GRANULOCYTES 1 (H) 0.0 - 0.5 %    ABS. NEUTROPHILS 9.0 (H) 1.8 - 8.0 K/UL    ABS. LYMPHOCYTES 1.7 0.8 - 3.5 K/UL    ABS. MONOCYTES 0.7 0.0 - 1.0 K/UL    ABS. EOSINOPHILS 0.0 0.0 - 0.4 K/UL    ABS. BASOPHILS 0.0 0.0 - 0.1 K/UL    ABS. IMM. GRANS. 0.1 (H) 0.00 - 0.04 K/UL    DF AUTOMATED     METABOLIC PANEL, COMPREHENSIVE    Collection Time: 07/02/17  1:46 AM   Result Value Ref Range    Sodium 138 136 - 145 mmol/L    Potassium 4.1 3.5 - 5.1 mmol/L    Chloride 101 97 - 108 mmol/L    CO2 25 21 - 32 mmol/L    Anion gap 12 5 - 15 mmol/L    Glucose 430 (H) 65 - 100 mg/dL    BUN 19 6 - 20 MG/DL    Creatinine 0.91 0.55 - 1.02 MG/DL    BUN/Creatinine ratio 21 (H) 12 - 20      GFR est AA >60 >60 ml/min/1.53m    GFR est non-AA >60 >60 ml/min/1.739m   Calcium 8.7 8.5 - 10.1 MG/DL    Bilirubin, total 0.3 0.2 - 1.0 MG/DL    ALT (SGPT) 23 12 - 78 U/L    AST (SGOT) <3 (L) 15 - 37 U/L    Alk. phosphatase 50 45 - 117 U/L    Protein, total 6.8 6.4 - 8.2 g/dL    Albumin 3.1 (L) 3.5 - 5.0 g/dL    Globulin 3.7 2.0 - 4.0 g/dL    A-G Ratio 0.8 (L) 1.1 - 2.2     SAMPLES BEING HELD    Collection Time: 07/02/17  1:46 AM   Result Value Ref Range    SAMPLES BEING HELD RD,BL     COMMENT        Add-on orders for these samples will be processed based on acceptable specimen integrity and analyte stability, which may vary by analyte.   LIPASE    Collection Time: 07/02/17  1:46 AM   Result Value Ref Range    Lipase 92 73 - 393 U/L   TROPONIN I    Collection Time: 07/02/17  1:46 AM   Result Value Ref Range    Troponin-I, Qt. <0.05 <0.05 ng/mL   EKG, 12 LEAD, INITIAL  Collection Time: 07/02/17  1:55 AM   Result Value Ref Range    Ventricular Rate 74 BPM    Atrial Rate 74 BPM    P-R Interval  148 ms    QRS Duration 74 ms    Q-T Interval 380 ms    QTC Calculation (Bezet) 421 ms    Calculated P Axis 54 degrees    Calculated R Axis 27 degrees    Calculated T Axis 30 degrees    Diagnosis       Normal sinus rhythm  When compared with ECG of 12-May-2017 17:01,  Nonspecific T wave abnormality no longer evident in Anterior leads     URINALYSIS W/ RFLX MICROSCOPIC    Collection Time: 07/02/17  3:26 AM   Result Value Ref Range    Color YELLOW/STRAW      Appearance CLEAR CLEAR      Specific gravity 1.015 1.003 - 1.030      pH (UA) 6.0 5.0 - 8.0      Protein NEGATIVE  NEG mg/dL    Glucose >1,000 (A) NEG mg/dL    Ketone NEGATIVE  NEG mg/dL    Bilirubin NEGATIVE  NEG      Blood NEGATIVE  NEG      Urobilinogen 0.2 0.2 - 1.0 EU/dL    Nitrites NEGATIVE  NEG      Leukocyte Esterase NEGATIVE  NEG      WBC 0-4 0 - 4 /hpf    RBC 0-5 0 - 5 /hpf    Epithelial cells FEW FEW /lpf    Bacteria NEGATIVE  NEG /hpf    Hyaline cast 0-2 0 - 5 /lpf   POC LACTIC ACID    Collection Time: 07/02/17  3:53 AM   Result Value Ref Range    Lactic Acid (POC) 2.0 0.4 - 2.0 mmol/L       Assessment:Plan   Abdominal pain. While she does have some stranding and an air pocket at the surgical site the UGI does not show any sign of reperforation or even contained leak. It is possible to have inflammatory changes after surgery.  Does not require operative intervention at this time  Would admit to medical service.  Protonix BID  Would empirically start abx.   Pain control   Following.       Signed By: Merrily Brittle, MD     July 02, 2017

## 2017-07-03 LAB — METABOLIC PANEL, COMPREHENSIVE
A-G Ratio: 0.7 — ABNORMAL LOW (ref 1.1–2.2)
ALT (SGPT): 18 U/L (ref 12–78)
AST (SGOT): 8 U/L — ABNORMAL LOW (ref 15–37)
Albumin: 2.5 g/dL — ABNORMAL LOW (ref 3.5–5.0)
Alk. phosphatase: 44 U/L — ABNORMAL LOW (ref 45–117)
Anion gap: 11 mmol/L (ref 5–15)
BUN/Creatinine ratio: 30 — ABNORMAL HIGH (ref 12–20)
BUN: 17 MG/DL (ref 6–20)
Bilirubin, total: 1.2 MG/DL — ABNORMAL HIGH (ref 0.2–1.0)
CO2: 22 mmol/L (ref 21–32)
Calcium: 8.5 MG/DL (ref 8.5–10.1)
Chloride: 105 mmol/L (ref 97–108)
Creatinine: 0.56 MG/DL (ref 0.55–1.02)
GFR est AA: >60 mL/min/{1.73_m2} (ref >60)
GFR est non-AA: >60 mL/min/{1.73_m2} (ref >60)
Globulin: 3.8 g/dL (ref 2.0–4.0)
Glucose: 193 mg/dL — ABNORMAL HIGH (ref 65–100)
Potassium: 3.8 mmol/L (ref 3.5–5.1)
Protein, total: 6.3 g/dL — ABNORMAL LOW (ref 6.4–8.2)
Sodium: 138 mmol/L (ref 136–145)

## 2017-07-03 LAB — CBC W/O DIFF
ABSOLUTE NRBC: 0 10*3/uL (ref 0.00–0.01)
HCT: 41.5 % (ref 35.0–47.0)
HGB: 13 g/dL (ref 11.5–16.0)
MCH: 29.9 PG (ref 26.0–34.0)
MCHC: 31.3 g/dL (ref 30.0–36.5)
MCV: 95.4 FL (ref 80.0–99.0)
MPV: 9.5 FL (ref 8.9–12.9)
NRBC: 0 PER 100 WBC
PLATELET: 165 10*3/uL (ref 150–400)
RBC: 4.35 M/uL (ref 3.80–5.20)
RDW: 15.9 % — ABNORMAL HIGH (ref 11.5–14.5)
WBC: 4.7 10*3/uL (ref 3.6–11.0)

## 2017-07-03 LAB — GLUCOSE, POC
Glucose (POC): 180 mg/dL — ABNORMAL HIGH (ref 65–100)
Glucose (POC): 175 mg/dL — ABNORMAL HIGH (ref 65–100)
Glucose (POC): 214 mg/dL — ABNORMAL HIGH (ref 65–100)
Glucose (POC): 240 mg/dL — ABNORMAL HIGH (ref 65–100)

## 2017-07-03 LAB — MAGNESIUM: Magnesium: 1.6 mg/dL (ref 1.6–2.4)

## 2017-07-03 LAB — PHOSPHORUS: Phosphorus: 3.3 MG/DL (ref 2.6–4.7)

## 2017-07-03 MED ORDER — METFORMIN 500 MG TAB
500 mg | Freq: Two times a day (BID) | ORAL | Status: DC
Start: 2017-07-03 — End: 2017-07-05

## 2017-07-03 MED ORDER — NS WITH POTASSIUM CHLORIDE 20 MEQ/L IV
20 mEq/L | INTRAVENOUS | Status: DC
Start: 2017-07-03 — End: 2017-07-08
  Administered 2017-07-03 – 2017-07-08 (×7): via INTRAVENOUS

## 2017-07-03 MED FILL — INSULIN LISPRO 100 UNIT/ML INJECTION: 100 unit/mL | SUBCUTANEOUS | Qty: 1

## 2017-07-03 MED FILL — INSULIN NPH HUMAN RECOMB 100 UNIT/ML INJECTION: 100 unit/mL | SUBCUTANEOUS | Qty: 1

## 2017-07-03 MED FILL — HYDROMORPHONE 2 MG/ML INJECTION SOLUTION: 2 mg/mL | INTRAMUSCULAR | Qty: 1

## 2017-07-03 MED FILL — ACETAMINOPHEN 325 MG TABLET: 325 mg | ORAL | Qty: 2

## 2017-07-03 MED FILL — SIMVASTATIN 40 MG TAB: 40 mg | ORAL | Qty: 1

## 2017-07-03 MED FILL — PIPERACILLIN-TAZOBACTAM 3.375 GRAM IV SOLR: 3.375 gram | INTRAVENOUS | Qty: 3.38

## 2017-07-03 MED FILL — NORMAL SALINE FLUSH 0.9 % INJECTION SYRINGE: INTRAMUSCULAR | Qty: 10

## 2017-07-03 MED FILL — PROTONIX 40 MG INTRAVENOUS SOLUTION: 40 mg | INTRAVENOUS | Qty: 40

## 2017-07-03 MED FILL — SERTRALINE 50 MG TAB: 50 mg | ORAL | Qty: 2

## 2017-07-03 MED FILL — PREDNISONE 10 MG TAB: 10 mg | ORAL | Qty: 2

## 2017-07-03 MED FILL — NS WITH POTASSIUM CHLORIDE 20 MEQ/L IV: 20 mEq/L | INTRAVENOUS | Qty: 1000

## 2017-07-03 NOTE — Progress Notes (Signed)
Progress Note    Patient: Dana [name] MRN: [id number]  SSN: [id number]    Date of Birth: 11/08/1955  Age: 61 y.o.  Sex: female      Admit Date: 07/02/2017    * No surgery found *    Procedure:      Subjective:     Patient still complaining of pain. She was up and walking around the unit today. She is feeling a little better.      Objective:     Visit Vitals  BP 117/72 (BP 1 Location: Left arm, BP Patient Position: Post activity)   Pulse (!) 112   Temp 98.5 ??F (36.9 ??C)   Resp 18   Ht 5' 4"  (1.626 m)   Wt 238 lb 5.1 oz (108.1 kg)   SpO2 94%   BMI 40.91 kg/m??       Temp (24hrs), Avg:98.4 ??F (36.9 ??C), Min:97.9 ??F (36.6 ??C), Max:98.9 ??F (37.2 ??C)      Physical Exam:    Gen- Alert NAD  Lungs- CTA  H-RRR  Abd- Soft still with tenderness in the RUG and mid abdomen.     Data Review: images and reports reviewed    Lab Review:   All lab results for the last 24 hours reviewed.  Recent Results (from the past 24 hour(s))   GLUCOSE, POC    Collection Time: 07/02/17 11:04 AM   Result Value Ref Range    Glucose (POC) 148 (H) 65 - 100 mg/dL    Performed by [place], POC    Collection Time: 07/02/17  4:25 PM   Result Value Ref Range    Glucose (POC) 178 (H) 65 - 100 mg/dL    Performed by [place], POC    Collection Time: 07/02/17  9:54 PM   Result Value Ref Range    Glucose (POC) 180 (H) 65 - 100 mg/dL    Performed by [place], COMPREHENSIVE    Collection Time: 07/03/17  3:46 AM   Result Value Ref Range    Sodium 138 136 - 145 mmol/L    Potassium 3.8 3.5 - 5.1 mmol/L    Chloride 105 97 - 108 mmol/L    CO2 22 21 - 32 mmol/L    Anion gap 11 5 - 15 mmol/L    Glucose 193 (H) 65 - 100 mg/dL    BUN 17 6 - 20 MG/DL    Creatinine 0.56 0.55 - 1.02 MG/DL    BUN/Creatinine ratio 30 (H) 12 - 20      GFR est AA >60 >60 ml/min/1.52m    GFR est non-AA >60 >60 ml/min/1.790m   Calcium 8.5 8.5 - 10.1 MG/DL    Bilirubin, total 1.2 (H) 0.2 - 1.0 MG/DL    ALT (SGPT) 18 12 - 78 U/L     AST (SGOT) 8 (L) 15 - 37 U/L    Alk. phosphatase 44 (L) 45 - 117 U/L    Protein, total 6.3 (L) 6.4 - 8.2 g/dL    Albumin 2.5 (L) 3.5 - 5.0 g/dL    Globulin 3.8 2.0 - 4.0 g/dL    A-G Ratio 0.7 (L) 1.1 - 2.2     CBC W/O DIFF    Collection Time: 07/03/17  3:46 AM   Result Value Ref Range    WBC 4.7 3.6 - 11.0 K/uL    RBC 4.35 3.80 - 5.20 M/uL  HGB 13.0 11.5 - 16.0 g/dL    HCT 41.5 35.0 - 47.0 %    MCV 95.4 80.0 - 99.0 FL    MCH 29.9 26.0 - 34.0 PG    MCHC 31.3 30.0 - 36.5 g/dL    RDW 15.9 (H) 11.5 - 14.5 %    PLATELET 165 150 - 400 K/uL    MPV 9.5 8.9 - 12.9 FL    NRBC 0.0 0 PER 100 WBC    ABSOLUTE NRBC 0.00 0.00 - 0.01 K/uL   MAGNESIUM    Collection Time: 07/03/17  3:46 AM   Result Value Ref Range    Magnesium 1.6 1.6 - 2.4 mg/dL   PHOSPHORUS    Collection Time: 07/03/17  3:46 AM   Result Value Ref Range    Phosphorus 3.3 2.6 - 4.7 MG/DL   GLUCOSE, POC    Collection Time: 07/03/17  6:27 AM   Result Value Ref Range    Glucose (POC) 175 (H) 65 - 100 mg/dL    Performed by Roxy Manns [facility]        Assessment:     Hospital Problems  Date Reviewed: July 21, 2017          Codes Class Noted POA    * (Principal) Abdominal pain ICD-10-CM: R10.9  ICD-9-CM: 789.00  07/02/2017 Yes        Diabetes ([place]) ICD-10-CM: E11.9  ICD-9-CM: 250.00  01/24/2013 Yes              Plan/Recommendations/Medical Decision Making:   Abdominal pain- Unknown etiology at this point but seems to be improving. Tachycardia was noted on last vitals but per nursing this was immediately after walking, otherwise her HR has been normal .  Will continue abx,.   Ok to start clears.     Signed By: Merrily Brittle, MD     July 03, 2017

## 2017-07-03 NOTE — Progress Notes (Signed)
Bedside and Verbal shift change report given to Jessie, RN (oncoming nurse) by Cary, RN (offgoing nurse). Report included the following information SBAR, Kardex and MAR.

## 2017-07-03 NOTE — Progress Notes (Signed)
Bedside shift change report given to Cary (oncoming nurse) by Frank (offgoing nurse). Report included the following information SBAR, Kardex, Intake/Output, MAR and Recent Results.

## 2017-07-03 NOTE — Progress Notes (Addendum)
Dr's Carollee Massed, Radford Pax, [place] & Raneshia Derick  Admit Date: 07/02/2017    Subjective:     Pt still with diffuse abd pain, though probably a little improved.  No new complaints.        Current Facility-Administered Medications   Medication Dose Route Frequency   ??? 0.9% sodium chloride with KCl 20 mEq/L infusion   IntraVENous CONTINUOUS   ??? pantoprazole (PROTONIX) 40 mg in sodium chloride 0.9% 10 mL injection  40 mg IntraVENous Q12H   ??? piperacillin-tazobactam (ZOSYN) 3.375 g in 0.9% sodium chloride (MBP/ADV) 100 mL  3.375 g IntraVENous Q8H   ??? predniSONE (DELTASONE) tablet 20 mg  20 mg Oral BID   ??? simvastatin (ZOCOR) tablet 40 mg  40 mg Oral QHS   ??? sertraline (ZOLOFT) tablet 100 mg  100 mg Oral DAILY   ??? sodium chloride (NS) flush 5-10 mL  5-10 mL IntraVENous Q8H   ??? sodium chloride (NS) flush 5-10 mL  5-10 mL IntraVENous PRN   ??? acetaminophen (TYLENOL) tablet 650 mg  650 mg Oral Q4H PRN   ??? ondansetron (ZOFRAN) injection 4 mg  4 mg IntraVENous Q4H PRN   ??? hydrALAZINE (APRESOLINE) 20 mg/mL injection 20 mg  20 mg IntraVENous Q6H PRN   ??? insulin lispro (HUMALOG) injection   SubCUTAneous AC&HS   ??? glucose chewable tablet 16 g  4 Tab Oral PRN   ??? dextrose (D50W) injection syrg 12.5-25 g  12.5-25 g IntraVENous PRN   ??? glucagon (GLUCAGEN) injection 1 mg  1 mg IntraMUSCular PRN   ??? HYDROmorphone (DILAUDID) injection 1 mg  1 mg IntraVENous Q3H PRN   ??? cloNIDine HCl (CATAPRES) tablet 0.1 mg  0.1 mg Oral Q4H PRN   ??? insulin NPH (NOVOLIN N, HUMULIN N) injection 20 Units  20 Units SubCUTAneous QHS          Objective:     Patient Vitals for the past 8 hrs:   BP Temp Pulse Resp SpO2   07/03/17 0859 117/72 ??? (!) 112 18 94 %   07/03/17 0222 135/85 98.5 ??F (36.9 ??C) 98 16 92 %     No intake/output data recorded.  No intake/output data recorded.    Physical Exam: NAD. A&O.                             Neck -- Supple.  No JVD.                             Heart -- RRR.                             Lungs -- CTA.                              Abd -- Benign.                             Ext -- No significant LE edema, b/l.      Data Review   Recent Results (from the past 24 hour(s))   GLUCOSE, POC    Collection Time: 07/02/17 11:04 AM   Result Value Ref Range    Glucose (POC) 148 (H) 65 - 100 mg/dL    Performed by [employer]  GLUCOSE, POC    Collection Time: 07/02/17  4:25 PM   Result Value Ref Range    Glucose (POC) 178 (H) 65 - 100 mg/dL    Performed by [place], POC    Collection Time: 07/02/17  9:54 PM   Result Value Ref Range    Glucose (POC) 180 (H) 65 - 100 mg/dL    Performed by [place], COMPREHENSIVE    Collection Time: 07/03/17  3:46 AM   Result Value Ref Range    Sodium 138 136 - 145 mmol/L    Potassium 3.8 3.5 - 5.1 mmol/L    Chloride 105 97 - 108 mmol/L    CO2 22 21 - 32 mmol/L    Anion gap 11 5 - 15 mmol/L    Glucose 193 (H) 65 - 100 mg/dL    BUN 17 6 - 20 MG/DL    Creatinine 0.56 0.55 - 1.02 MG/DL    BUN/Creatinine ratio 30 (H) 12 - 20      GFR est AA >60 >60 ml/min/1.24m    GFR est non-AA >60 >60 ml/min/1.772m   Calcium 8.5 8.5 - 10.1 MG/DL    Bilirubin, total 1.2 (H) 0.2 - 1.0 MG/DL    ALT (SGPT) 18 12 - 78 U/L    AST (SGOT) 8 (L) 15 - 37 U/L    Alk. phosphatase 44 (L) 45 - 117 U/L    Protein, total 6.3 (L) 6.4 - 8.2 g/dL    Albumin 2.5 (L) 3.5 - 5.0 g/dL    Globulin 3.8 2.0 - 4.0 g/dL    A-G Ratio 0.7 (L) 1.1 - 2.2     CBC W/O DIFF    Collection Time: 07/03/17  3:46 AM   Result Value Ref Range    WBC 4.7 3.6 - 11.0 K/uL    RBC 4.35 3.80 - 5.20 M/uL    HGB 13.0 11.5 - 16.0 g/dL    HCT 41.5 35.0 - 47.0 %    MCV 95.4 80.0 - 99.0 FL    MCH 29.9 26.0 - 34.0 PG    MCHC 31.3 30.0 - 36.5 g/dL    RDW 15.9 (H) 11.5 - 14.5 %    PLATELET 165 150 - 400 K/uL    MPV 9.5 8.9 - 12.9 FL    NRBC 0.0 0 PER 100 WBC    ABSOLUTE NRBC 0.00 0.00 - 0.01 K/uL   MAGNESIUM    Collection Time: 07/03/17  3:46 AM   Result Value Ref Range    Magnesium 1.6 1.6 - 2.4 mg/dL   PHOSPHORUS     Collection Time: 07/03/17  3:46 AM   Result Value Ref Range    Phosphorus 3.3 2.6 - 4.7 MG/DL   GLUCOSE, POC    Collection Time: 07/03/17  6:27 AM   Result Value Ref Range    Glucose (POC) 175 (H) 65 - 100 mg/dL    Performed by OWRae Mar          Assessment:     Principal Problem:    Abdominal pain -- Etiology unclear.      Active Problems:    HTN (hypertension) ()      Diabetes ([place](01/24/2013)      Recent gastric ulcer with perforation -- s/p surgery 1 1/2 mos ago.        Plan:     1. Cont empiric IV Zosyn.  2. Adjust IVF.  3. Agree with clears.  4. Surgery following.  5. If pain persists, may need to consider EGD....  6. Resume Metformin at 500 mg BID.  On HS NPH (lower dose than at home).  Cont SSI.            Eddie Candle, MD

## 2017-07-03 NOTE — Progress Notes (Signed)
Bedside and Verbal shift change report given to Christy, RN (oncoming nurse) by Jessie, RN (offgoing nurse). Report included the following information SBAR, Kardex, ED Summary, Procedure Summary, Intake/Output, MAR and Recent Results.

## 2017-07-04 ENCOUNTER — Inpatient Hospital Stay: Admit: 2017-07-04 | Payer: BLUE CROSS/BLUE SHIELD | Primary: Internal Medicine

## 2017-07-04 ENCOUNTER — Inpatient Hospital Stay: Payer: BLUE CROSS/BLUE SHIELD | Primary: Internal Medicine

## 2017-07-04 ENCOUNTER — Inpatient Hospital Stay

## 2017-07-04 LAB — METABOLIC PANEL, COMPREHENSIVE
A-G Ratio: 0.6 — ABNORMAL LOW (ref 1.1–2.2)
ALT (SGPT): 21 U/L (ref 12–78)
AST (SGOT): 10 U/L — ABNORMAL LOW (ref 15–37)
Albumin: 2.3 g/dL — ABNORMAL LOW (ref 3.5–5.0)
Alk. phosphatase: 51 U/L (ref 45–117)
Anion gap: 9 mmol/L (ref 5–15)
BUN/Creatinine ratio: 24 — ABNORMAL HIGH (ref 12–20)
BUN: 14 MG/DL (ref 6–20)
Bilirubin, total: 1.1 MG/DL — ABNORMAL HIGH (ref 0.2–1.0)
CO2: 25 mmol/L (ref 21–32)
Calcium: 8.9 MG/DL (ref 8.5–10.1)
Chloride: 100 mmol/L (ref 97–108)
Creatinine: 0.58 MG/DL (ref 0.55–1.02)
GFR est AA: >60 mL/min/{1.73_m2} (ref >60)
GFR est non-AA: >60 mL/min/{1.73_m2} (ref >60)
Globulin: 4.1 g/dL — ABNORMAL HIGH (ref 2.0–4.0)
Glucose: 196 mg/dL — ABNORMAL HIGH (ref 65–100)
Potassium: 4.2 mmol/L (ref 3.5–5.1)
Protein, total: 6.4 g/dL (ref 6.4–8.2)
Sodium: 134 mmol/L — ABNORMAL LOW (ref 136–145)

## 2017-07-04 LAB — CBC WITH AUTOMATED DIFF
ABS. BASOPHILS: 0 10*3/uL (ref 0.0–0.1)
ABS. EOSINOPHILS: 0 10*3/uL (ref 0.0–0.4)
ABS. IMM. GRANS.: 0 10*3/uL
ABS. LYMPHOCYTES: 0.7 10*3/uL — ABNORMAL LOW (ref 0.8–3.5)
ABS. MONOCYTES: 0 10*3/uL (ref 0.0–1.0)
ABS. NEUTROPHILS: 8.2 10*3/uL — ABNORMAL HIGH (ref 1.8–8.0)
ABSOLUTE NRBC: 0 10*3/uL (ref 0.00–0.01)
BAND NEUTROPHILS: 3 % (ref 0–6)
BASOPHILS: 0 % (ref 0–1)
EOSINOPHILS: 0 % (ref 0–7)
HCT: 38 % (ref 35.0–47.0)
HGB: 11.8 g/dL (ref 11.5–16.0)
IMMATURE GRANULOCYTES: 0 %
LYMPHOCYTES: 8 % — ABNORMAL LOW (ref 12–49)
MCH: 29.6 PG (ref 26.0–34.0)
MCHC: 31.1 g/dL (ref 30.0–36.5)
MCV: 95.2 FL (ref 80.0–99.0)
METAMYELOCYTES: 2 % — ABNORMAL HIGH
MONOCYTES: 0 % — ABNORMAL LOW (ref 5–13)
MPV: 9.9 FL (ref 8.9–12.9)
MYELOCYTES: 2 % — ABNORMAL HIGH
NEUTROPHILS: 85 % — ABNORMAL HIGH (ref 32–75)
NRBC: 0 PER 100 WBC
PLATELET: 168 10*3/uL (ref 150–400)
RBC: 3.99 M/uL (ref 3.80–5.20)
RDW: 15.4 % — ABNORMAL HIGH (ref 11.5–14.5)
WBC: 9.3 10*3/uL (ref 3.6–11.0)

## 2017-07-04 LAB — GLUCOSE, POC
Glucose (POC): 175 mg/dL — ABNORMAL HIGH (ref 65–100)
Glucose (POC): 163 mg/dL — ABNORMAL HIGH (ref 65–100)
Glucose (POC): 117 mg/dL — ABNORMAL HIGH (ref 65–100)
Glucose (POC): 183 mg/dL — ABNORMAL HIGH (ref 65–100)

## 2017-07-04 LAB — LACTIC ACID: Lactic acid: 1.3 MMOL/L (ref 0.4–2.0)

## 2017-07-04 MED ORDER — IOPAMIDOL 76 % IV SOLN
370 mg iodine /mL (76 %) | Freq: Once | INTRAVENOUS | Status: AC
Start: 2017-07-04 — End: 2017-07-04
  Administered 2017-07-04: 16:00:00 via INTRAVENOUS

## 2017-07-04 MED ORDER — ENOXAPARIN 40 MG/0.4 ML SUB-Q SYRINGE
40 mg/0.4 mL | Freq: Every day | SUBCUTANEOUS | Status: DC
Start: 2017-07-04 — End: 2017-07-14
  Administered 2017-07-05 – 2017-07-14 (×10): via SUBCUTANEOUS

## 2017-07-04 MED ORDER — FLUCONAZOLE IN SALINE (ISO-OSMOTIC) 200 MG/100 ML IV PIGGY BACK
200 mg/100 mL | INTRAVENOUS | Status: DC
Start: 2017-07-04 — End: 2017-07-08
  Administered 2017-07-04 – 2017-07-07 (×4): via INTRAVENOUS

## 2017-07-04 MED ORDER — SODIUM CHLORIDE 0.9 % IJ SYRG
Freq: Once | INTRAMUSCULAR | Status: AC
Start: 2017-07-04 — End: 2017-07-04
  Administered 2017-07-04: 19:00:00 via INTRAVENOUS

## 2017-07-04 MED ORDER — HYDROMORPHONE 2 MG/ML INJECTION SOLUTION
2 mg/mL | INTRAMUSCULAR | Status: DC | PRN
Start: 2017-07-04 — End: 2017-07-14
  Administered 2017-07-04 – 2017-07-11 (×34): via INTRAVENOUS

## 2017-07-04 MED ORDER — IOHEXOL 240 MG/ML IV SOLN
240 mg iodine/mL | Freq: Once | INTRAVENOUS | Status: AC
Start: 2017-07-04 — End: 2017-07-04
  Administered 2017-07-04: 19:00:00 via ORAL

## 2017-07-04 MED ORDER — SODIUM CHLORIDE 0.9% BOLUS IV
0.9 % | Freq: Once | INTRAVENOUS | Status: AC
Start: 2017-07-04 — End: 2017-07-04
  Administered 2017-07-04: 19:00:00 via INTRAVENOUS

## 2017-07-04 MED FILL — PROTONIX 40 MG INTRAVENOUS SOLUTION: 40 mg | INTRAVENOUS | Qty: 40

## 2017-07-04 MED FILL — HYDROMORPHONE 2 MG/ML INJECTION SOLUTION: 2 mg/mL | INTRAMUSCULAR | Qty: 1

## 2017-07-04 MED FILL — NORMAL SALINE FLUSH 0.9 % INJECTION SYRINGE: INTRAMUSCULAR | Qty: 10

## 2017-07-04 MED FILL — PREDNISONE 10 MG TAB: 10 mg | ORAL | Qty: 2

## 2017-07-04 MED FILL — SERTRALINE 50 MG TAB: 50 mg | ORAL | Qty: 2

## 2017-07-04 MED FILL — PIPERACILLIN-TAZOBACTAM 3.375 GRAM IV SOLR: 3.375 gram | INTRAVENOUS | Qty: 3.38

## 2017-07-04 MED FILL — SIMVASTATIN 40 MG TAB: 40 mg | ORAL | Qty: 1

## 2017-07-04 MED FILL — ONDANSETRON (PF) 4 MG/2 ML INJECTION: 4 mg/2 mL | INTRAMUSCULAR | Qty: 2

## 2017-07-04 MED FILL — INSULIN NPH HUMAN RECOMB 100 UNIT/ML INJECTION: 100 unit/mL | SUBCUTANEOUS | Qty: 1

## 2017-07-04 MED FILL — INSULIN LISPRO 100 UNIT/ML INJECTION: 100 unit/mL | SUBCUTANEOUS | Qty: 1

## 2017-07-04 MED FILL — FLUCONAZOLE IN SALINE (ISO-OSMOTIC) 200 MG/100 ML IV PIGGY BACK: 200 mg/100 mL | INTRAVENOUS | Qty: 100

## 2017-07-04 MED FILL — METFORMIN 500 MG TAB: 500 mg | ORAL | Qty: 1

## 2017-07-04 NOTE — Progress Notes (Signed)
Ms. Dana [name] feels better this PM.  Tm 98.5 HR: 87 BP: 156/102 Resp Rate: 18 94% sat on room air.    Intake/Output Summary (Last 24 hours) at 07/04/2017 1621  Last data filed at 07/03/2017 1900  Gross per 24 hour   Intake 350 ml   Output ???   Net 350 ml   Exam: Cor: RRR.              Lungs: Bilateral breath sounds. Clear to auscultation.              Abd: Soft. Tender in RUQ.            Guarding. No rebound.             Non distended.  Labs:   Recent Results (from the past 12 hour(s))   GLUCOSE, POC    Collection Time: 07/04/17  6:08 AM   Result Value Ref Range    Glucose (POC) 163 (H) 65 - 100 mg/dL    Performed by Burton ApleyLIU GUILAN    LACTIC ACID    Collection Time: 07/04/17 10:22 AM   Result Value Ref Range    Lactic acid 1.3 0.4 - 2.0 MMOL/L   GLUCOSE, POC    Collection Time: 07/04/17 11:11 AM   Result Value Ref Range    Glucose (POC) 117 (H) 65 - 100 mg/dL    Performed by [place] [facility] Shakiyah    GLUCOSE, POC    Collection Time: 07/04/17  4:01 PM   Result Value Ref Range    Glucose (POC) 183 (H) 65 - 100 mg/dL    Performed by [facility] Mask[facility] Shakiyah    Reviewed CT Scan abdomen/pelvis with radiology - there is a contained perforation in the RUQ.  Will ask IR to drain the collection percutaneously to help control perforation.   Continue NPO.  Will start TPN/Lipids in AM.   Needs PICC line.   IV abx - Zosyn. Will add Diflucan.  Pain medication and anti-emetics as needed.   DVT prophylaxis - Lovenox as ordered.  Spoke with Dr. Doreene ElandHendrix.

## 2017-07-04 NOTE — Progress Notes (Signed)
Bedside and Verbal shift change report given to Ian MalkinZach, Charity fundraiserN (oncoming nurse) by Darliss CheneyAutumn Bryan, RN (offgoing nurse). Report included the following information SBAR, Kardex, Procedure Summary, MAR and Recent Results.

## 2017-07-04 NOTE — Progress Notes (Signed)
Medical Progress Note      NAME: Dana [name]   DOB:  March 03, 1956  MRM:  [id number]    Date/Time: 07/04/2017  3:02 PM    Problem List:   Principal Problem:    Abdominal pain (07/02/2017)    Active Problems:    HTN (hypertension) ()      Diabetes (HCC) (01/24/2013)      Acute gastric ulcer with perforation (HCC) (05/13/2017)           Subjective:     Patient seen early this am.  Was having moderate pain controlled by iv dilaudid.  Felt like gas in lower quadrants unlike previous pain  ( perf ulcer ) in epigastrium and chest.      Past Medical History:   Diagnosis Date   ??? Diabetes (HCC) 01/24/2013   ??? GERD (gastroesophageal reflux disease)    ??? Hives    ??? HTN (hypertension)    ??? Hypercholesterolemia    ??? Perforated gastric ulcer (HCC)        ROS:  Respiratory:  negative for cough, sputum production, SOB, wheezing, DOE, pleuritic pain  Cardiology:  negative for chest pain, palpitations, orthopnea, PND, edema, syncope   Gastrointestinal: positive for lower abd pain         Objective:       Vitals:          Last 24hrs VS reviewed since prior progress note. Most recent are:    Visit Vitals  BP (!) 156/102   Pulse 87   Temp 97.8 ??F (36.6 ??C)   Resp 18   Ht 5\' 4"  (1.626 m)   Wt 238 lb 5.1 oz (108.1 kg)   SpO2 94%   BMI 40.91 kg/m??     SpO2 Readings from Last 6 Encounters:   07/04/17 94%   06/09/17 98%   05/23/17 98%   05/18/17 93%   05/20/15 94%   11/30/10 100%    O2 Flow Rate (L/min): 21 l/min       Intake/Output Summary (Last 24 hours) at 07/04/2017 1502  Last data filed at 07/03/2017 1900  Gross per 24 hour   Intake 350 ml   Output ???   Net 350 ml          Exam:     General   Obese 61 yo wf  Respiratory   Dec bs in bases ( hurts to take deep breath)  Cardiology  regular  Abdominal  Obese, soft, generalized abd tenderness, dec bowel sounds  Extremities  No clubbing, cyanosis, or edema. Pulses intact.    Lab Data Reviewed: (see below)    Medications Reviewed: (see below)     ______________________________________________________________________    Medications:     Current Facility-Administered Medications   Medication Dose Route Frequency   ??? HYDROmorphone (DILAUDID) injection 2 mg  2 mg IntraVENous Q3H PRN   ??? iopamidol (ISOVUE-370) 76 % injection 100 mL  100 mL IntraVENous RAD ONCE   ??? [START ON 07/05/2017] enoxaparin (LOVENOX) injection 40 mg  40 mg SubCUTAneous DAILY   ??? 0.9% sodium chloride with KCl 20 mEq/L infusion   IntraVENous CONTINUOUS   ??? metFORMIN (GLUCOPHAGE) tablet 500 mg  500 mg Oral BID WITH MEALS   ??? pantoprazole (PROTONIX) 40 mg in sodium chloride 0.9% 10 mL injection  40 mg IntraVENous Q12H   ??? piperacillin-tazobactam (ZOSYN) 3.375 g in 0.9% sodium chloride (MBP/ADV) 100 mL  3.375 g IntraVENous Q8H   ??? predniSONE (DELTASONE) tablet 20 mg  20 mg Oral BID   ??? simvastatin (ZOCOR) tablet 40 mg  40 mg Oral QHS   ??? sertraline (ZOLOFT) tablet 100 mg  100 mg Oral DAILY   ??? sodium chloride (NS) flush 5-10 mL  5-10 mL IntraVENous Q8H   ??? sodium chloride (NS) flush 5-10 mL  5-10 mL IntraVENous PRN   ??? acetaminophen (TYLENOL) tablet 650 mg  650 mg Oral Q4H PRN   ??? ondansetron (ZOFRAN) injection 4 mg  4 mg IntraVENous Q4H PRN   ??? hydrALAZINE (APRESOLINE) 20 mg/mL injection 20 mg  20 mg IntraVENous Q6H PRN   ??? insulin lispro (HUMALOG) injection   SubCUTAneous AC&HS   ??? glucose chewable tablet 16 g  4 Tab Oral PRN   ??? dextrose (D50W) injection syrg 12.5-25 g  12.5-25 g IntraVENous PRN   ??? glucagon (GLUCAGEN) injection 1 mg  1 mg IntraMUSCular PRN   ??? cloNIDine HCl (CATAPRES) tablet 0.1 mg  0.1 mg Oral Q4H PRN   ??? insulin NPH (NOVOLIN N, HUMULIN N) injection 20 Units  20 Units SubCUTAneous QHS            Lab Review:     Recent Labs     07/04/17  0224 07/03/17  0346 07/02/17  0146   WBC 9.3 4.7 11.7*   HGB 11.8 13.0 12.9   HCT 38.0 41.5 40.9   PLT 168 165 206     Recent Labs     07/04/17  0224 07/03/17  0346 07/02/17  0146   NA 134* 138 138   K 4.2 3.8 4.1   CL 100 105 101    CO2 25 22 25    GLU 196* 193* 430*   BUN 14 17 19    CREA 0.58 0.56 0.91   CA 8.9 8.5 8.7   MG  --  1.6  --    PHOS  --  3.3  --    ALB 2.3* 2.5* 3.1*   TBILI 1.1* 1.2* 0.3   SGOT 10* 8* <3*   ALT 21 18 23      Lab Results   Component Value Date/Time    Glucose (POC) 117 (H) 07/04/2017 11:11 AM    Glucose (POC) 163 (H) 07/04/2017 06:08 AM    Glucose (POC) 175 (H) 07/03/2017 09:03 PM    Glucose (POC) 240 (H) 07/03/2017 04:18 PM    Glucose (POC) 214 (H) 07/03/2017 11:26 AM     No results for input(s): PH, PCO2, PO2, HCO3, FIO2 in the last 72 hours.  No results for input(s): INR in the last 72 hours.    No lab exists for component: INREXT    Other pertinent lab: NA         Assessment:     Patient Active Problem List   Diagnosis Code   ??? Hypercholesterolemia E78.00   ??? HTN (hypertension) I10   ??? Diabetes (HCC) E11.9   ??? Sleep apnea G47.30   ??? Hypertension complicating diabetes (HCC) E11.59, I10   ??? Morbid obesity due to excess calories (HCC) E66.01   ??? Hives L50.9   ??? Acute gastric ulcer with perforation (HCC) K25.1   ??? Arthralgia of both knees M25.561, M25.562   ??? Abdominal pain R10.9          Plan:                 1. abd pain-? Etiology= await repeat CT-  hydrate  2. Diabetes- ssi  3. Hypertension= hold po meds,  ___________________________________________________    Attending Physician: Genene Churn, MD

## 2017-07-04 NOTE — Progress Notes (Signed)
Ms. Lingle reports abdominal pain this PM.  Tm 98.5 HR: 87 BP: 156/102 Resp Rate: 18 94% sat on room air.    Intake/Output Summary (Last 24 hours) at 07/04/2017 1304  Last data filed at 07/03/2017 1900  Gross per 24 hour   Intake 700 ml   Output ???   Net 700 ml   Exam: Cor: RRR.              Lungs: Bilateral breath sounds. Clear to auscultation.              Abd: Soft. Tender.            Non distended.           Well healed surgical incision.  Labs:   Recent Results (from the past 12 hour(s))   METABOLIC PANEL, COMPREHENSIVE    Collection Time: 07/04/17  2:24 AM   Result Value Ref Range    Sodium 134 (L) 136 - 145 mmol/L    Potassium 4.2 3.5 - 5.1 mmol/L    Chloride 100 97 - 108 mmol/L    CO2 25 21 - 32 mmol/L    Anion gap 9 5 - 15 mmol/L    Glucose 196 (H) 65 - 100 mg/dL    BUN 14 6 - 20 MG/DL    Creatinine 0.58 0.55 - 1.02 MG/DL    BUN/Creatinine ratio 24 (H) 12 - 20      GFR est AA >60 >60 ml/min/1.48m    GFR est non-AA >60 >60 ml/min/1.723m   Calcium 8.9 8.5 - 10.1 MG/DL    Bilirubin, total 1.1 (H) 0.2 - 1.0 MG/DL    ALT (SGPT) 21 12 - 78 U/L    AST (SGOT) 10 (L) 15 - 37 U/L    Alk. phosphatase 51 45 - 117 U/L    Protein, total 6.4 6.4 - 8.2 g/dL    Albumin 2.3 (L) 3.5 - 5.0 g/dL    Globulin 4.1 (H) 2.0 - 4.0 g/dL    A-G Ratio 0.6 (L) 1.1 - 2.2     CBC WITH AUTOMATED DIFF    Collection Time: 07/04/17  2:24 AM   Result Value Ref Range    WBC 9.3 3.6 - 11.0 K/uL    RBC 3.99 3.80 - 5.20 M/uL    HGB 11.8 11.5 - 16.0 g/dL    HCT 38.0 35.0 - 47.0 %    MCV 95.2 80.0 - 99.0 FL    MCH 29.6 26.0 - 34.0 PG    MCHC 31.1 30.0 - 36.5 g/dL    RDW 15.4 (H) 11.5 - 14.5 %    PLATELET 168 150 - 400 K/uL    MPV 9.9 8.9 - 12.9 FL    NRBC 0.0 0 PER 100 WBC    ABSOLUTE NRBC 0.00 0.00 - 0.01 K/uL    NEUTROPHILS 85 (H) 32 - 75 %    BAND NEUTROPHILS 3 0 - 6 %    LYMPHOCYTES 8 (L) 12 - 49 %    MONOCYTES 0 (L) 5 - 13 %    EOSINOPHILS 0 0 - 7 %    BASOPHILS 0 0 - 1 %    METAMYELOCYTES 2 (H) 0 %    MYELOCYTES 2 (H) 0 %     IMMATURE GRANULOCYTES 0 %    ABS. NEUTROPHILS 8.2 (H) 1.8 - 8.0 K/UL    ABS. LYMPHOCYTES 0.7 (L) 0.8 - 3.5 K/UL    ABS. MONOCYTES 0.0 0.0 - 1.0  K/UL    ABS. EOSINOPHILS 0.0 0.0 - 0.4 K/UL    ABS. BASOPHILS 0.0 0.0 - 0.1 K/UL    ABS. IMM. GRANS. 0.0 K/UL    DF MANUAL      PLATELET COMMENTS Large Platelets      RBC COMMENTS ANISOCYTOSIS  1+        RBC COMMENTS MACROCYTOSIS  1+        RBC COMMENTS POLYCHROMASIA  1+        RBC COMMENTS OVALOCYTES  PRESENT       GLUCOSE, POC    Collection Time: 07/04/17  6:08 AM   Result Value Ref Range    Glucose (POC) 163 (H) 65 - 100 mg/dL    Performed by Quin Hoop    LACTIC ACID    Collection Time: 07/04/17 10:22 AM   Result Value Ref Range    Lactic acid 1.3 0.4 - 2.0 MMOL/L   GLUCOSE, POC    Collection Time: 07/04/17 11:11 AM   Result Value Ref Range    Glucose (POC) 117 (H) 65 - 100 mg/dL    Performed by Santiago Glad    Reviewed upper GI.   NPO for now.   IVF at 100cc/hour.   Will repeat CT Scan abdomen/pelvis today.  Pain medication and anti-emetics as needed.   Continue BID Protonix for now.   IV abx - Zosyn as ordered   Plans per Dr. Lennox Laity.

## 2017-07-04 NOTE — Progress Notes (Signed)
Nurse Guilan Liu gave bedside report to oncoming nurse Kelly Gibbs RN by SBAR and Kardex

## 2017-07-04 NOTE — Progress Notes (Signed)
Paged Dr Robby SermonHendricks to clarify whether or not to give scheduled NPH to patient with NPO status.  Received orders to hold NPH and give 2 units humalog per sliding scale.

## 2017-07-04 NOTE — Other (Signed)
DTC Progress Note    Recommendations/ Comments: Chart reviewed on Dana [name] for hyperglycemia. Pt admitted d/t RUQ abdominal pain workup. Pt on steroids PTA, continues to receive 20 mg prednisone BID while in house.  BG >180 mg/dl x 2 over the last 24 hours, noted BG to be improving today. Metformin resumed this morning.     If appropriate, please consider:  Continue current medication regimen at this time  Closely observe BG trend when diet re-initiated - consider increasing NPH back to home dose of 32 units when pt able to consume greater than or equal to 75% of meals.   Advance from NPO to Diabetic Diet as tolerated    Current hospital DM medication:  NPH, 20 units, evenings  Lispro, normal scale - 8 units of correction received x 24 hours  Metformin, 1000 mg, daily        Patient is a 61 y.o. female with known history of DM on NPH and Metformin at home.    A1c:   Lab Results   Component Value Date/Time    Hemoglobin A1c 7.3 (H) 05/13/2017 03:04 AM    Hemoglobin A1c 7.2 (H) 01/31/2015 03:52 PM       Recent Glucose Results:   Lab Results   Component Value Date/Time    GLU 196 (H) 07/04/2017 02:24 AM    GLUCPOC 117 (H) 07/04/2017 11:11 AM    GLUCPOC 163 (H) 07/04/2017 06:08 AM    GLUCPOC 175 (H) 07/03/2017 09:03 PM        Lab Results   Component Value Date/Time    Creatinine 0.58 07/04/2017 02:24 AM     Estimated Creatinine Clearance: 125.5 mL/min (based on SCr of 0.58 mg/dL).    Active Orders   Diet    DIET NPO Except Meds        PO intake: No data found.    Will continue to follow as needed.    Thank you,  Lorenda HatchetEmilee Fareeha Evon, RD  [facility]

## 2017-07-04 NOTE — Progress Notes (Signed)
CT + bowel perforation. Sgy to see again

## 2017-07-04 NOTE — Progress Notes (Signed)
Spiritual Care Partner Volunteer visited patient in Rm 554 on 07/04/2017.  Documented by:  Clancy GourdLarry Johnson, Chaplain, MDiv, MS, BCC  [address] [phone]

## 2017-07-04 NOTE — Progress Notes (Signed)
General Surgery Daily Progress Note  Admit Date: 07/02/2017  Post-Operative Day: * No surgery found * from * No surgery found *     Subjective:     Last 24 hrs: Pt is having a lot of diffuse abd tenderness.  Passing gas and liq stool when she voids.   WBC WNL  She was not in pain earlier this am per nursing.    Objective:     Blood pressure 123/85, pulse 78, temperature 97.7 ??F (36.5 ??C), resp. rate 18, height 5\' 4"  (1.626 m), weight 238 lb 5.1 oz (108.1 kg), SpO2 94 %.  Temp (24hrs), Avg:98.1 ??F (36.7 ??C), Min:97.7 ??F (36.5 ??C), Max:98.3 ??F (36.8 ??C)      _____________________  Physical Exam:     Alert and Oriented, , appears in obvious pain  Cardiovascular: RRR, no peripheral edema  Lungs:CTAB anteriorallly  Abdomen: some distention, no real BS heard, diffuse tenderness  throughout      Assessment:   Principal Problem:    Abdominal pain (07/02/2017)    Active Problems:    HTN (hypertension) ()      Diabetes (HCC) (01/24/2013)      Acute gastric ulcer with perforation (HCC) (05/13/2017)            Plan:     Check lactate level  CT A/P stat  NPO  Cont abx  Will d/w Dr Noel Geroldohen    Data Review:    Recent Labs     07/04/17  0224 07/03/17  0346 07/02/17  0146   WBC 9.3 4.7 11.7*   HGB 11.8 13.0 12.9   HCT 38.0 41.5 40.9   PLT 168 165 206     Recent Labs     07/04/17  0224 07/03/17  0346 07/02/17  0146   NA 134* 138 138   K 4.2 3.8 4.1   CL 100 105 101   CO2 25 22 25    GLU 196* 193* 430*   BUN 14 17 19    CREA 0.58 0.56 0.91   CA 8.9 8.5 8.7   MG  --  1.6  --    PHOS  --  3.3  --    ALB 2.3* 2.5* 3.1*   SGOT 10* 8* <3*   ALT 21 18 23      Recent Labs     07/02/17  0146   LPSE 92           ______________________  Medications:    Current Facility-Administered Medications   Medication Dose Route Frequency   ??? HYDROmorphone (DILAUDID) injection 2 mg  2 mg IntraVENous Q3H PRN   ??? 0.9% sodium chloride with KCl 20 mEq/L infusion   IntraVENous CONTINUOUS   ??? metFORMIN (GLUCOPHAGE) tablet 500 mg  500 mg Oral BID WITH MEALS    ??? pantoprazole (PROTONIX) 40 mg in sodium chloride 0.9% 10 mL injection  40 mg IntraVENous Q12H   ??? piperacillin-tazobactam (ZOSYN) 3.375 g in 0.9% sodium chloride (MBP/ADV) 100 mL  3.375 g IntraVENous Q8H   ??? predniSONE (DELTASONE) tablet 20 mg  20 mg Oral BID   ??? simvastatin (ZOCOR) tablet 40 mg  40 mg Oral QHS   ??? sertraline (ZOLOFT) tablet 100 mg  100 mg Oral DAILY   ??? sodium chloride (NS) flush 5-10 mL  5-10 mL IntraVENous Q8H   ??? sodium chloride (NS) flush 5-10 mL  5-10 mL IntraVENous PRN   ??? acetaminophen (TYLENOL) tablet 650 mg  650 mg Oral Q4H PRN   ???  ondansetron (ZOFRAN) injection 4 mg  4 mg IntraVENous Q4H PRN   ??? hydrALAZINE (APRESOLINE) 20 mg/mL injection 20 mg  20 mg IntraVENous Q6H PRN   ??? insulin lispro (HUMALOG) injection   SubCUTAneous AC&HS   ??? glucose chewable tablet 16 g  4 Tab Oral PRN   ??? dextrose (D50W) injection syrg 12.5-25 g  12.5-25 g IntraVENous PRN   ??? glucagon (GLUCAGEN) injection 1 mg  1 mg IntraMUSCular PRN   ??? cloNIDine HCl (CATAPRES) tablet 0.1 mg  0.1 mg Oral Q4H PRN   ??? insulin NPH (NOVOLIN N, HUMULIN N) injection 20 Units  20 Units SubCUTAneous QHS       Berneice Gandy, NP  07/04/2017

## 2017-07-05 ENCOUNTER — Inpatient Hospital Stay: Admit: 2017-07-05 | Payer: BLUE CROSS/BLUE SHIELD | Primary: Internal Medicine

## 2017-07-05 LAB — METABOLIC PANEL, BASIC
Anion gap: 10 mmol/L (ref 5–15)
BUN/Creatinine ratio: 35 — ABNORMAL HIGH (ref 12–20)
BUN: 16 MG/DL (ref 6–20)
CO2: 26 mmol/L (ref 21–32)
Calcium: 8.5 MG/DL (ref 8.5–10.1)
Chloride: 101 mmol/L (ref 97–108)
Creatinine: 0.46 MG/DL — ABNORMAL LOW (ref 0.55–1.02)
GFR est AA: >60 mL/min/{1.73_m2} (ref >60)
GFR est non-AA: >60 mL/min/{1.73_m2} (ref >60)
Glucose: 165 mg/dL — ABNORMAL HIGH (ref 65–100)
Potassium: 4.2 mmol/L (ref 3.5–5.1)
Sodium: 137 mmol/L (ref 136–145)

## 2017-07-05 LAB — MAGNESIUM: Magnesium: 1.9 mg/dL (ref 1.6–2.4)

## 2017-07-05 LAB — GLUCOSE, POC
Glucose (POC): 202 mg/dL — ABNORMAL HIGH (ref 65–100)
Glucose (POC): 187 mg/dL — ABNORMAL HIGH (ref 65–100)
Glucose (POC): 180 mg/dL — ABNORMAL HIGH (ref 65–100)

## 2017-07-05 LAB — CBC WITH AUTOMATED DIFF
ABS. BASOPHILS: 0 10*3/uL (ref 0.0–0.1)
ABS. EOSINOPHILS: 0 10*3/uL (ref 0.0–0.4)
ABS. IMM. GRANS.: 0 10*3/uL
ABS. LYMPHOCYTES: 0.8 10*3/uL (ref 0.8–3.5)
ABS. MONOCYTES: 0.4 10*3/uL (ref 0.0–1.0)
ABS. NEUTROPHILS: 9.5 10*3/uL — ABNORMAL HIGH (ref 1.8–8.0)
ABSOLUTE NRBC: 0 10*3/uL (ref 0.00–0.01)
BAND NEUTROPHILS: 9 % — ABNORMAL HIGH (ref 0–6)
BASOPHILS: 0 % (ref 0–1)
EOSINOPHILS: 0 % (ref 0–7)
HCT: 34.9 % — ABNORMAL LOW (ref 35.0–47.0)
HGB: 10.8 g/dL — ABNORMAL LOW (ref 11.5–16.0)
IMMATURE GRANULOCYTES: 0 %
LYMPHOCYTES: 7 % — ABNORMAL LOW (ref 12–49)
MCH: 29.3 PG (ref 26.0–34.0)
MCHC: 30.9 g/dL (ref 30.0–36.5)
MCV: 94.8 FL (ref 80.0–99.0)
MONOCYTES: 4 % — ABNORMAL LOW (ref 5–13)
MPV: 9.9 FL (ref 8.9–12.9)
NEUTROPHILS: 80 % — ABNORMAL HIGH (ref 32–75)
NRBC: 0 PER 100 WBC
PLATELET: 201 10*3/uL (ref 150–400)
RBC: 3.68 M/uL — ABNORMAL LOW (ref 3.80–5.20)
RDW: 15.2 % — ABNORMAL HIGH (ref 11.5–14.5)
WBC: 10.7 10*3/uL (ref 3.6–11.0)

## 2017-07-05 LAB — SAMPLES BEING HELD

## 2017-07-05 LAB — PHOSPHORUS: Phosphorus: 2.5 MG/DL — ABNORMAL LOW (ref 2.6–4.7)

## 2017-07-05 MED ORDER — SODIUM CHLORIDE 0.9 % IJ SYRG
INTRAMUSCULAR | Status: DC | PRN
Start: 2017-07-05 — End: 2017-07-14

## 2017-07-05 MED ORDER — SODIUM CHLORIDE 0.9 % IV
INTRAVENOUS | Status: DC
Start: 2017-07-05 — End: 2017-07-05
  Administered 2017-07-05: 20:00:00 via INTRAVENOUS

## 2017-07-05 MED ORDER — MIDAZOLAM 1 MG/ML IJ SOLN
1 mg/mL | INTRAMUSCULAR | Status: DC | PRN
Start: 2017-07-05 — End: 2017-07-05
  Administered 2017-07-05 (×2): via INTRAVENOUS

## 2017-07-05 MED ORDER — PREDNISONE 10 MG TAB
10 mg | Freq: Two times a day (BID) | ORAL | Status: DC
Start: 2017-07-05 — End: 2017-07-06
  Administered 2017-07-05 – 2017-07-06 (×3): via ORAL

## 2017-07-05 MED ORDER — FENTANYL CITRATE (PF) 50 MCG/ML IJ SOLN
50 mcg/mL | INTRAMUSCULAR | Status: DC | PRN
Start: 2017-07-05 — End: 2017-07-05
  Administered 2017-07-05 (×2): via INTRAVENOUS

## 2017-07-05 MED ORDER — POTASSIUM ACETATE 2 MEQ/ML IV
2 mEq/mL | INTRAVENOUS | Status: DC
Start: 2017-07-05 — End: 2017-07-05

## 2017-07-05 MED ORDER — SODIUM CHLORIDE 0.9 % IJ SYRG
INTRAMUSCULAR | Status: DC | PRN
Start: 2017-07-05 — End: 2017-07-14
  Administered 2017-07-10 – 2017-07-13 (×4)

## 2017-07-05 MED ORDER — LIDOCAINE (PF) 10 MG/ML (1 %) IJ SOLN
10 mg/mL (1 %) | INTRAMUSCULAR | Status: AC
Start: 2017-07-05 — End: 2017-07-06
  Administered 2017-07-05: 20:00:00

## 2017-07-05 MED ORDER — SODIUM CHLORIDE 0.9 % IJ SYRG
INTRAMUSCULAR | Status: DC
Start: 2017-07-05 — End: 2017-07-14
  Administered 2017-07-05 – 2017-07-14 (×9)

## 2017-07-05 MED ORDER — FAT EMULSION 20 % IV EMUL
20 % | INTRAVENOUS | Status: DC
Start: 2017-07-05 — End: 2017-07-13
  Administered 2017-07-07 – 2017-07-11 (×3): via INTRAVENOUS

## 2017-07-05 MED ORDER — BACITRACIN 500 UNIT/G TOPICAL PACKET
500 unit/gram | CUTANEOUS | Status: DC | PRN
Start: 2017-07-05 — End: 2017-07-14

## 2017-07-05 MED ORDER — CALCIUM GLUCONATE 100 MG/ML (10%) IV SOLN
10010 mg/mL (10%) | INTRAVENOUS | Status: AC
Start: 2017-07-05 — End: 2017-07-06
  Administered 2017-07-06: via INTRAVENOUS

## 2017-07-05 MED ORDER — SODIUM CHLORIDE 0.9 % IJ SYRG
Freq: Three times a day (TID) | INTRAMUSCULAR | Status: DC
Start: 2017-07-05 — End: 2017-07-14
  Administered 2017-07-05 – 2017-07-14 (×29)

## 2017-07-05 MED ORDER — ALTEPLASE 2 MG SOLUTION FOR INJECTION
2 mg | Status: DC | PRN
Start: 2017-07-05 — End: 2017-07-14
  Administered 2017-07-05

## 2017-07-05 MED FILL — HYDROMORPHONE 2 MG/ML INJECTION SOLUTION: 2 mg/mL | INTRAMUSCULAR | Qty: 1

## 2017-07-05 MED FILL — AMINOSYN II 10 % IV: 10 % | INTRAVENOUS | Qty: 996

## 2017-07-05 MED FILL — INSULIN LISPRO 100 UNIT/ML INJECTION: 100 unit/mL | SUBCUTANEOUS | Qty: 1

## 2017-07-05 MED FILL — PIPERACILLIN-TAZOBACTAM 3.375 GRAM IV SOLR: 3.375 gram | INTRAVENOUS | Qty: 3.38

## 2017-07-05 MED FILL — FENTANYL CITRATE (PF) 50 MCG/ML IJ SOLN: 50 mcg/mL | INTRAMUSCULAR | Qty: 2

## 2017-07-05 MED FILL — ONDANSETRON (PF) 4 MG/2 ML INJECTION: 4 mg/2 mL | INTRAMUSCULAR | Qty: 2

## 2017-07-05 MED FILL — MIDAZOLAM 1 MG/ML IJ SOLN: 1 mg/mL | INTRAMUSCULAR | Qty: 5

## 2017-07-05 MED FILL — NORMAL SALINE FLUSH 0.9 % INJECTION SYRINGE: INTRAMUSCULAR | Qty: 10

## 2017-07-05 MED FILL — LIDOCAINE (PF) 10 MG/ML (1 %) IJ SOLN: 10 mg/mL (1 %) | INTRAMUSCULAR | Qty: 30

## 2017-07-05 MED FILL — SODIUM CHLORIDE 0.9 % IV: INTRAVENOUS | Qty: 1000

## 2017-07-05 MED FILL — PREDNISONE 10 MG TAB: 10 mg | ORAL | Qty: 1

## 2017-07-05 MED FILL — SERTRALINE 50 MG TAB: 50 mg | ORAL | Qty: 2

## 2017-07-05 MED FILL — CATHFLO ACTIVASE 2 MG INTRA-CATHETER SOLUTION: 2 mg | Qty: 1

## 2017-07-05 MED FILL — SIMVASTATIN 40 MG TAB: 40 mg | ORAL | Qty: 1

## 2017-07-05 MED FILL — PROTONIX 40 MG INTRAVENOUS SOLUTION: 40 mg | INTRAVENOUS | Qty: 40

## 2017-07-05 MED FILL — FLUCONAZOLE IN SALINE (ISO-OSMOTIC) 200 MG/100 ML IV PIGGY BACK: 200 mg/100 mL | INTRAVENOUS | Qty: 100

## 2017-07-05 MED FILL — NS WITH POTASSIUM CHLORIDE 20 MEQ/L IV: 20 mEq/L | INTRAVENOUS | Qty: 1000

## 2017-07-05 MED FILL — AMINOSYN II 10 % IV: 10 % | INTRAVENOUS | Qty: 504

## 2017-07-05 MED FILL — ENOXAPARIN 40 MG/0.4 ML SUB-Q SYRINGE: 40 mg/0.4 mL | SUBCUTANEOUS | Qty: 0.4

## 2017-07-05 NOTE — Progress Notes (Signed)
Progress Note    Patient: Dana [name] MRN: [id number]  SSN: [id number]    Date of Birth: [birth year]  Age: 61 y.o.  Sex: female      Admit Date: 07/02/2017    * No surgery found *    Procedure:      Subjective:     Patient still with RUQ pain but a little better .   She has not had her perc drainage yet.      Objective:     Visit Vitals  BP 149/87 (BP 1 Location: Right arm, BP Patient Position: At rest)   Pulse 82   Temp 98.1 ??F (36.7 ??C)   Resp 17   Ht 5\' 4"  (1.626 m)   Wt 238 lb 5.1 oz (108.1 kg)   SpO2 93%   BMI 40.91 kg/m??       Temp (24hrs), Avg:98.1 ??F (36.7 ??C), Min:98 ??F (36.7 ??C), Max:98.2 ??F (36.8 ??C)      Physical Exam:    Gen- Alert in NAD  Abd- Soft with more focal tenderness in the RUQ  Does not have diffuse peritonitis.     Data Review: images and reports reviewed    Lab Review:   All lab results for the last 24 hours reviewed.  Recent Results (from the past 24 hour(s))   GLUCOSE, POC    Collection Time: 07/04/17  4:01 PM   Result Value Ref Range    Glucose (POC) 183 (H) 65 - 100 mg/dL    Performed by Aurora MaskGates Shakiyah    GLUCOSE, POC    Collection Time: 07/04/17  9:18 PM   Result Value Ref Range    Glucose (POC) 202 (H) 65 - 100 mg/dL    Performed by NASH Sarah    GLUCOSE, POC    Collection Time: 07/05/17  6:51 AM   Result Value Ref Range    Glucose (POC) 187 (H) 65 - 100 mg/dL    Performed by Olena MaterANAJA CAMERON        Assessment:     Hospital Problems  Date Reviewed: 07/01/2017          Codes Class Noted POA    * (Principal) Abdominal pain ICD-10-CM: R10.9  ICD-9-CM: 789.00  07/02/2017 Yes        Acute gastric ulcer with perforation (HCC) ICD-10-CM: K25.1  ICD-9-CM: 531.10  05/13/2017 Yes        Diabetes (HCC) ICD-10-CM: E11.9  ICD-9-CM: 250.00  01/24/2013 Yes        HTN (hypertension) ICD-10-CM: I10  ICD-9-CM: 401.9  Unknown Yes              Plan/Recommendations/Medical Decision Making:   Leak s/p ulcer repair.- This is quite unusual coming 7 weeks after the repair.   Will have percutaneous drainage   Continue PPI  Will start TPN- RD consult  Continue abx including diflucan  Gi consult - will get input       Signed By: Hoy FinlayStuart Tashiya Souders, MD     July 05, 2017

## 2017-07-05 NOTE — Progress Notes (Signed)
TRANSFER - IN REPORT:    Verbal report received from [facility](name) on Dana [name]  being received from 5S(unit) for routine progression of care      Report consisted of patient???s Situation, Background, Assessment and   Recommendations(SBAR).     Information from the following report(s) SBAR, Kardex, ED Summary, Intake/Output, MAR and Recent Results was reviewed with the receiving nurse.    Opportunity for questions and clarification was provided.      Assessment completed upon patient???s arrival to unit and care assumed.

## 2017-07-05 NOTE — Progress Notes (Signed)
Problem: Falls - Risk of  Goal: *Absence of Falls  Document Schmid Fall Risk and appropriate interventions in the flowsheet.  Outcome: Progressing Towards Goal  Fall Risk Interventions:  Mobility Interventions: Communicate number of staff needed for ambulation/transfer         Medication Interventions: Patient to call before getting OOB, Teach patient to arise slowly    Elimination Interventions: Call light in reach             Problem: Pressure Injury - Risk of  Goal: *Prevention of pressure injury  Document Braden Scale and appropriate interventions in the flowsheet.  Outcome: Progressing Towards Goal  Pressure Injury Interventions:  Sensory Interventions: Assess need for specialty bed    Moisture Interventions: Absorbent underpads    Activity Interventions: Pressure redistribution bed/mattress(bed type)    Mobility Interventions: Pressure redistribution bed/mattress (bed type)    Nutrition Interventions: Offer support with meals,snacks and hydration    Friction and Shear Interventions: Lift sheet

## 2017-07-05 NOTE — Progress Notes (Signed)
TRANSFER - OUT REPORT:    Verbal report given to Shannon(name) on Dana [name]  being transferred to 6 east(unit) for routine progression of care       Report consisted of patient???s Situation, Background, Assessment and   Recommendations(SBAR).     Information from the following report(s) SBAR, Kardex, Intake/Output and MAR was reviewed with the receiving nurse.    Lines:   PICC Triple Lumen 07/05/17 Basilic;Right (Active)   Criteria for Appropriate Use Total parenteral nutrition 07/05/2017 10:27 AM   Site Assessment Clean, dry, & intact 07/05/2017 10:27 AM   Phlebitis Assessment 0 07/05/2017 10:27 AM   Infiltration Assessment 0 07/05/2017 10:27 AM   Arm Circumference (cm) 39.5 cm 07/05/2017 10:27 AM   Date of Last Dressing Change 07/05/17 07/05/2017 10:27 AM   Dressing Status New;Occlusive 07/05/2017 10:27 AM   External Catheter Length (cm) 0 centimeters 07/05/2017 10:27 AM   Dressing Type Disk with Chlorhexadine gluconate (CHG);Immobilizer;Transparent 07/05/2017 10:27 AM   Hub Color/Line Status Red;Capped 07/05/2017 10:27 AM   Positive Blood Return (Site #1) Yes 07/05/2017 10:27 AM   Hub Color/Line Status White;Capped 07/05/2017 10:27 AM   Positive Blood Return (Site #2) Yes 07/05/2017 10:27 AM   Hub Color/Line Status Gray;Capped 07/05/2017 10:27 AM   Positive Blood Return (Site #3) Yes 07/05/2017 10:27 AM   Alcohol Cap Used Yes 07/05/2017 10:27 AM       Peripheral IV 07/03/17 Anterior;Left Hand (Active)   Site Assessment Clean, dry, & intact 07/05/2017 12:07 AM   Phlebitis Assessment 0 07/05/2017 12:07 AM   Infiltration Assessment 0 07/05/2017 12:07 AM   Dressing Status Clean, dry, & intact 07/05/2017 12:07 AM   Dressing Type Transparent 07/05/2017 12:07 AM   Hub Color/Line Status Infusing 07/05/2017 12:07 AM   Action Taken Open ports on tubing capped 07/05/2017 12:07 AM   Alcohol Cap Used Yes 07/05/2017 12:07 AM       Peripheral IV 07/04/17 Anterior;Inferior;Lower;Right Arm (Active)    Site Assessment Clean, dry, & intact 07/05/2017 12:07 AM   Phlebitis Assessment 0 07/05/2017 12:07 AM   Infiltration Assessment 0 07/05/2017 12:07 AM   Dressing Status Clean, dry, & intact 07/05/2017 12:07 AM   Dressing Type Transparent 07/05/2017 12:07 AM   Hub Color/Line Status Capped 07/05/2017 12:07 AM   Action Taken Open ports on tubing capped 07/05/2017 12:07 AM   Alcohol Cap Used Yes 07/05/2017 12:07 AM        Opportunity for questions and clarification was provided.

## 2017-07-05 NOTE — H&P (Signed)
Radiology History and Physical    Patient: Dana [name] 61 y.o. female     Referring Physician: Noel Gerold.    Chief Complaint:   Chief Complaint   Patient presents with   ??? Abdominal Pain       History of Present Illness: Peritoneal collection.    History:    Past Medical History:   Diagnosis Date   ??? Diabetes (HCC) 01/24/2013   ??? GERD (gastroesophageal reflux disease)    ??? Hives    ??? HTN (hypertension)    ??? Hypercholesterolemia    ??? Perforated gastric ulcer (HCC)      Family History   Problem Relation Age of Onset   ??? Hypertension Mother    ??? Cancer Father         mesothelioma   ??? Diabetes Father    ??? Hypertension Father      Social History     Socioeconomic History   ??? Marital status: SINGLE     Spouse name: Not on file   ??? Number of children: Not on file   ??? Years of education: Not on file   ??? Highest education level: Not on file   Social Needs   ??? Financial resource strain: Not on file   ??? Food insecurity - worry: Not on file   ??? Food insecurity - inability: Not on file   ??? Transportation needs - medical: Not on file   ??? Transportation needs - non-medical: Not on file   Occupational History   ??? Not on file   Tobacco Use   ??? Smoking status: Former Smoker     Packs/day: 1.00     Last attempt to quit: 06/27/2011     Years since quitting: 6.0   ??? Smokeless tobacco: Never Used   Substance and Sexual Activity   ??? Alcohol use: Yes     Alcohol/week: 0.6 - 1.2 oz     Types: 1 - 2 Glasses of wine per week     Comment: Per month    ??? Drug use: No   ??? Sexual activity: Yes   Other Topics Concern   ??? Not on file   Social History Narrative   ??? Not on file       Allergies:   Allergies   Allergen Reactions   ??? Adhesive Itching   ??? Dolobid [Diflunisal] Itching     Itching,swelling       Current Medications:  Current Facility-Administered Medications   Medication Dose Route Frequency   ??? predniSONE (DELTASONE) tablet 10 mg  10 mg Oral BID   ??? sodium chloride (NS) flush 20 mL  20 mL InterCATHeter PRN    ??? sodium chloride (NS) flush 10 mL  10 mL InterCATHeter Q24H   ??? sodium chloride (NS) flush 10 mL  10 mL InterCATHeter PRN   ??? sodium chloride (NS) flush 10 mL  10 mL InterCATHeter Q8H   ??? alteplase (CATHFLO) 1 mg in sterile water (preservative free) 1 mL injection  1 mg InterCATHeter PRN   ??? bacitracin 500 unit/gram packet 1 Packet  1 Packet Topical PRN   ??? TPN ADULT - CENTRAL   IntraVENous CONTINUOUS   ??? [START ON 07/06/2017] fat emulsion 20% (LIPOSYN, INTRAlipid) infusion 500 mL  500 mL IntraVENous Q MON, WED & FRI   ??? 0.9% sodium chloride infusion  25 mL/hr IntraVENous CONTINUOUS   ??? midazolam (VERSED) injection 5 mg  5 mg IntraVENous Multiple   ??? fentaNYL citrate (PF)  injection 100 mcg  100 mcg IntraVENous Multiple   ??? lidocaine (PF) (XYLOCAINE) 10 mg/mL (1 %) injection       ??? HYDROmorphone (DILAUDID) injection 2 mg  2 mg IntraVENous Q3H PRN   ??? enoxaparin (LOVENOX) injection 40 mg  40 mg SubCUTAneous DAILY   ??? fluconazole (DIFLUCAN) 200mg /100 mL IVPB (premix)  200 mg IntraVENous Q24H   ??? 0.9% sodium chloride with KCl 20 mEq/L infusion   IntraVENous CONTINUOUS   ??? pantoprazole (PROTONIX) 40 mg in sodium chloride 0.9% 10 mL injection  40 mg IntraVENous Q12H   ??? piperacillin-tazobactam (ZOSYN) 3.375 g in 0.9% sodium chloride (MBP/ADV) 100 mL  3.375 g IntraVENous Q8H   ??? simvastatin (ZOCOR) tablet 40 mg  40 mg Oral QHS   ??? sertraline (ZOLOFT) tablet 100 mg  100 mg Oral DAILY   ??? sodium chloride (NS) flush 5-10 mL  5-10 mL IntraVENous Q8H   ??? sodium chloride (NS) flush 5-10 mL  5-10 mL IntraVENous PRN   ??? acetaminophen (TYLENOL) tablet 650 mg  650 mg Oral Q4H PRN   ??? ondansetron (ZOFRAN) injection 4 mg  4 mg IntraVENous Q4H PRN   ??? hydrALAZINE (APRESOLINE) 20 mg/mL injection 20 mg  20 mg IntraVENous Q6H PRN   ??? insulin lispro (HUMALOG) injection   SubCUTAneous AC&HS   ??? glucose chewable tablet 16 g  4 Tab Oral PRN   ??? dextrose (D50W) injection syrg 12.5-25 g  12.5-25 g IntraVENous PRN    ??? glucagon (GLUCAGEN) injection 1 mg  1 mg IntraMUSCular PRN   ??? cloNIDine HCl (CATAPRES) tablet 0.1 mg  0.1 mg Oral Q4H PRN   ??? insulin NPH (NOVOLIN N, HUMULIN N) injection 20 Units  20 Units SubCUTAneous QHS        Physical Exam:  Blood pressure (!) 164/96, pulse 85, temperature 98.7 ??F (37.1 ??C), resp. rate 18, height 5\' 4"  (1.626 m), weight 108.1 kg (238 lb 5.1 oz), SpO2 96 %.  GENERAL: alert, cooperative, no distress, appears stated age, LUNG: clear to auscultation bilaterally, HEART: regular rate and rhythm      Alerts:    Hospital Problems  Date Reviewed: 07/01/2017          Codes Class Noted POA    * (Principal) Abdominal pain ICD-10-CM: R10.9  ICD-9-CM: 789.00  07/02/2017 Yes        Acute gastric ulcer with perforation (HCC) ICD-10-CM: K25.1  ICD-9-CM: 531.10  05/13/2017 Yes        Diabetes (HCC) ICD-10-CM: E11.9  ICD-9-CM: 250.00  01/24/2013 Yes        HTN (hypertension) ICD-10-CM: I10  ICD-9-CM: 401.9  Unknown Yes              Laboratory:      Recent Labs     07/05/17  1152   HGB 10.8*   HCT 34.9*   WBC 10.7   PLT 201   BUN 16   CREA 0.46*   K 4.2         Plan of Care/Planned Procedure:  Risks, benefits, and alternatives reviewed with patient and she agrees to proceed with the procedure.       Full dictated report to follow.

## 2017-07-05 NOTE — Consults (Addendum)
[facility]   [address] [place] [address]        GASTROENTEROLOGY CONSULTATION Stacey Drain, [place] office  [phone] NP/PA in-hospital cell phone M-F until 4:30PM  After 5PM or on weekends, please call operator for physician on call        NAME:  Dana [name]   DOB:   September 22, 1955   MRN:   [id number]       Referring Physician: Dr. Hoy Finlay    Consult Date: 07/05/2017 2:13 PM    Chief Complaint: abdominal pain     History of Present Illness:  Patient is a 61 y.o. who is seen in consultation at the request of Dr. Amada Jupiter for recurrent ulcer.  Patient has a past medical history of diabetes mellitus, hypertension, and polymyalgia rheumatica on chronic steroid therapy.  She is status post exploratory laparotomy with repair of perforated posterior ulcer with oversew and onlay omental patch on 05/13/17.  She presented to the ED with complaints of abdominal pain.  Patient was admitted to the hospital on 07/02/17.    Patient complains of a sudden onset of lower abdominal pain that started at the end of last week.  Pain has been concentrated to the lower abdomen, right side worse than left.  She describes the pain as a constant sharp, stabbing sensation.  Pain is worse with movement.  No nausea, reflux, or vomiting.  No diarrhea or constipation.  No melena or hematochezia.  No fevers or chills.  Patient was seen in our office last week and was doing well at that time.  She had been scheduled for an EGD.    No NSAID use since 05/2017.  No anticoagulation.  Rare alcohol use.  Occasional tobacco use (3 cigarettes per day, previously she was smoking less than 1 pack per day).  No history of EGD.  Colonoscopy (04/28/17) by Dr. Dagmar Hait showed mild diverticulosis of the left side of the colon.  A repeat colonoscopy was recommended in 10 years.      I have reviewed the emergency room note, hospital admission note, notes by  all other clinicians who have seen the patient during this hospitalization to date. I have reviewed the problem list and the reason for this hospitalization. I have reviewed the allergies and the medications the patient was taking at home prior to this hospitalization.    PMH:  Past Medical History:   Diagnosis Date   ??? Diabetes (HCC) 01/24/2013   ??? GERD (gastroesophageal reflux disease)    ??? Hives    ??? HTN (hypertension)    ??? Hypercholesterolemia    ??? Perforated gastric ulcer (HCC)        PSH:  Past Surgical History:   Procedure Laterality Date   ??? ABDOMEN SURGERY PROC UNLISTED  05/12/2017    Lap exploratory perforated posterior ulcer repair by Dr. Amada Jupiter    ??? HX HYSTERECTOMY         Allergies:  Allergies   Allergen Reactions   ??? Adhesive Itching   ??? Dolobid [Diflunisal] Itching     Itching,swelling       Home Medications:  Prior to Admission Medications   Prescriptions Last Dose Informant Patient Reported? Taking?   CARAFATE 100 mg/mL suspension   No No   Sig: TAKE 10 ML BY MOUTH FOUR (4) TIMES DAILY FOR 28 DAYS. INDICATIONS: GASTRIC ULCER   insulin NPH (HUMULIN N NPH INSULIN KWIKPEN) 100 unit/mL (3 mL) inpn   No  No   Sig: INJECT 32 UNITS SUBCUTANEOUSLY AT BEDTIME   Patient taking differently: 40 Units. INJECT 32 UNITS SUBCUTANEOUSLY AT BEDTIME   lidocaine (LIDODERM) 5 %   Yes No   Sig: 1 Patch by TransDERmal route every twelve (12) hours as needed. Apply patch to the affected area for 12 hours a day and remove for 12 hours a day.   metFORMIN ER (GLUCOPHAGE XR) 500 mg tablet   No No   Sig: TAKE 1 TABLET EVERY MORNING AND 2 TABLETS WITH DINNER   Patient taking differently: 2 bid   pantoprazole (PROTONIX) 40 mg tablet   No No   Sig: Take 1 Tab by mouth Daily (before breakfast).   predniSONE (DELTASONE) 20 mg tablet   No No   Sig: Take 1 Tab by mouth two (2) times a day.   sertraline (ZOLOFT) 100 mg tablet   No No   Sig: TAKE 1 TABLET BY MOUTH DAILY   simvastatin (ZOCOR) 40 mg tablet   No No    Sig: TAKE 1 TABLET NIGHTLY   traMADol (ULTRAM) 50 mg tablet   No No   Sig: Take 1 Tab by mouth every eight (8) hours as needed for Pain. Max Daily Amount: 150 mg.   triamterene-hydroCHLOROthiazide (MAXZIDE) 37.5-25 mg per tablet   No No   Sig: TAKE 1 TABLET DAILY      Facility-Administered Medications: None       Hospital Medications:  Current Facility-Administered Medications   Medication Dose Route Frequency   ??? predniSONE (DELTASONE) tablet 10 mg  10 mg Oral BID   ??? sodium chloride (NS) flush 20 mL  20 mL InterCATHeter PRN   ??? sodium chloride (NS) flush 10 mL  10 mL InterCATHeter Q24H   ??? sodium chloride (NS) flush 10 mL  10 mL InterCATHeter PRN   ??? sodium chloride (NS) flush 10 mL  10 mL InterCATHeter Q8H   ??? alteplase (CATHFLO) 1 mg in sterile water (preservative free) 1 mL injection  1 mg InterCATHeter PRN   ??? bacitracin 500 unit/gram packet 1 Packet  1 Packet Topical PRN   ??? TPN ADULT - CENTRAL   IntraVENous CONTINUOUS   ??? [START ON 07/06/2017] fat emulsion 20% (LIPOSYN, INTRAlipid) infusion 500 mL  500 mL IntraVENous Q MON, WED & FRI   ??? HYDROmorphone (DILAUDID) injection 2 mg  2 mg IntraVENous Q3H PRN   ??? enoxaparin (LOVENOX) injection 40 mg  40 mg SubCUTAneous DAILY   ??? fluconazole (DIFLUCAN) 200mg /100 mL IVPB (premix)  200 mg IntraVENous Q24H   ??? 0.9% sodium chloride with KCl 20 mEq/L infusion   IntraVENous CONTINUOUS   ??? pantoprazole (PROTONIX) 40 mg in sodium chloride 0.9% 10 mL injection  40 mg IntraVENous Q12H   ??? piperacillin-tazobactam (ZOSYN) 3.375 g in 0.9% sodium chloride (MBP/ADV) 100 mL  3.375 g IntraVENous Q8H   ??? simvastatin (ZOCOR) tablet 40 mg  40 mg Oral QHS   ??? sertraline (ZOLOFT) tablet 100 mg  100 mg Oral DAILY   ??? sodium chloride (NS) flush 5-10 mL  5-10 mL IntraVENous Q8H   ??? sodium chloride (NS) flush 5-10 mL  5-10 mL IntraVENous PRN   ??? acetaminophen (TYLENOL) tablet 650 mg  650 mg Oral Q4H PRN   ??? ondansetron (ZOFRAN) injection 4 mg  4 mg IntraVENous Q4H PRN    ??? hydrALAZINE (APRESOLINE) 20 mg/mL injection 20 mg  20 mg IntraVENous Q6H PRN   ??? insulin lispro (HUMALOG) injection   SubCUTAneous  AC&HS   ??? glucose chewable tablet 16 g  4 Tab Oral PRN   ??? dextrose (D50W) injection syrg 12.5-25 g  12.5-25 g IntraVENous PRN   ??? glucagon (GLUCAGEN) injection 1 mg  1 mg IntraMUSCular PRN   ??? cloNIDine HCl (CATAPRES) tablet 0.1 mg  0.1 mg Oral Q4H PRN   ??? insulin NPH (NOVOLIN N, HUMULIN N) injection 20 Units  20 Units SubCUTAneous QHS       Social History:  Social History     Tobacco Use   ??? Smoking status: Former Smoker     Packs/day: 1.00     Last attempt to quit: 06/27/2011     Years since quitting: 6.0   ??? Smokeless tobacco: Never Used   Substance Use Topics   ??? Alcohol use: Yes     Alcohol/week: 0.6 - 1.2 oz     Types: 1 - 2 Glasses of wine per week     Comment: Per month        Family History:  Family History   Problem Relation Age of Onset   ??? Hypertension Mother    ??? Cancer Father         mesothelioma   ??? Diabetes Father    ??? Hypertension Father        Review of Systems:  Constitutional: negative fever, negative chills, negative weight loss  Eyes:   negative visual changes  ENT:   negative sore throat, tongue or lip swelling  Respiratory:  negative cough, negative dyspnea  Cards:  negative for chest pain, palpitations, lower extremity edema  GI:   See HPI  GU:  negative for frequency, dysuria  Integument:  negative for rash and pruritus  Heme:  negative for easy bruising and gum/nose bleeding  Musculoskeletal:negative for myalgias, back pain and muscle weakness  Neuro:    negative for headaches, dizziness, vertigo  Psych: negative for feelings of anxiety, depression     Objective:     Patient Vitals for the past 8 hrs:   BP Temp Pulse Resp SpO2   07/05/17 1251 (!) 159/99 98.8 ??F (37.1 ??C) 88 16 93 %   07/05/17 1203 (!) 166/99 98 ??F (36.7 ??C) 81 16 93 %   07/05/17 0810 149/87 98.1 ??F (36.7 ??C) 82 17 93 %     No intake/output data recorded.  10/28 1901 - 10/30 0700   In: 1200 [I.V.:1200]  Out: -     EXAM:     CONST:  Pleasant female lying in bed, no acute distress   NEURO:  Alert and oriented x 3   HEENT: EOMI, no scleral icterus   LUNGS: CTA bilaterally anteriorly   CARD:  S1 S2   ABD:  Soft, obese, mildly distended, RLQ and LLQ tenderness, no rebound, no guarding. + Bowel sounds.    EXT:  Warm   PSYCH: Full, not anxious     Data Review     Recent Labs     07/05/17  1152 07/04/17  0224   WBC 10.7 9.3   HGB 10.8* 11.8   HCT 34.9* 38.0   PLT 201 168     Recent Labs     07/05/17  1152 07/04/17  0224 07/03/17  0346   NA 137 134* 138   K 4.2 4.2 3.8   CL 101 100 105   CO2 26 25 22    BUN 16 14 17    CREA 0.46* 0.58 0.56   GLU 165* 196* 193*   PHOS  2.5*  --  3.3   CA 8.5 8.9 8.5     Recent Labs     07/04/17  0224 07/03/17  0346   SGOT 10* 8*   AP 51 44*   TP 6.4 6.3*   ALB 2.3* 2.5*   GLOB 4.1* 3.8     No results for input(s): INR, PTP, APTT in the last 72 hours.    No lab exists for component: INREXT       Assessment:   ?? Abdominal pain: UGI series (07/02/17): no extravasation of contrast at the site of recent gastric ulcer repair to suggest reperforation. CT abdomen/pelvis with IV and oral contrast (07/04/17): evidence consistent with increasing perforation viscus in the right upper quadrant with increasing gas, extravasated contrast, and small amounts of fluid. WBC 10.7, Hgb 10.8, AST: 10, ALT: 21, alkaline phosphatase: 51, total bilirubin: 1.1, lactic acid: 1.3.   ?? History of perforated gastric ulcer with repair (05/2017)     Patient Active Problem List   Diagnosis Code   ??? Hypercholesterolemia E78.00   ??? HTN (hypertension) I10   ??? Diabetes (HCC) E11.9   ??? Sleep apnea G47.30   ??? Hypertension complicating diabetes (HCC) E11.59, I10   ??? Morbid obesity due to excess calories (HCC) E66.01   ??? Hives L50.9   ??? Acute gastric ulcer with perforation (HCC) K25.1   ??? Arthralgia of both knees M25.561, M25.562   ??? Abdominal pain R10.9     Plan:     ?? NPO  ?? TPN ordered  ?? PPI BID   ?? Continue supportive measures  ?? On antibiotics  ?? Per surgery, plan for percutaneous drainage  ?? Plan for outpatient EGD after clearance from surgery  ?? Thank you for allowing me to participate in care of Neva Seat     Signed By: Joylene John, PA     07/05/2017  2:13 PM       GI attending note:    Gen: NAD; Cardiac: nml S1, S2; Pulm: CTA B; Abd: decreased BS, tender in the R side of the abdomen, nd, no rebound/invol guarding. Vitals, labs, and imaging reviewed. Agree with the history, physical, and plan of the APP. S/p IR drain. PPI. Plan for outpatient EGD after acute issues dealt with. Will sign off. Please call with any questions.     Thank you for this consultation.    Dr. Dagmar Hait

## 2017-07-05 NOTE — Progress Notes (Signed)
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

## 2017-07-05 NOTE — Progress Notes (Signed)
Bedside and Verbal shift change report given to Tresa EndoKelly, Charity fundraiserN (oncoming nurse) by Ian MalkinZach, RN (offgoing nurse). Report included the following information SBAR.

## 2017-07-05 NOTE — Progress Notes (Signed)
NUTRITION COMPLETE ASSESSMENT    RECOMMENDATIONS:   1. Plan ONS PRN when PO resumed and TPN discontinued.  2. TPN: Suggest decrease D20% to D15%; goal 2 liters + 20% lipid 500 ml x 3/wk  3. Consider adding insulin to TPN if BG greater than 200.      Interventions/Plan:   Food/Nutrient Delivery:          Initiate parenteral nutrition  Nutrition Education:     Coordination of Care:    Nutrition Counseling:        Assessment:   Reason for Assessment:   [x] Provider Consult    Diet: NPO  Supplements: NPO; RD re-evaluate for protein ONS when PO resumed  Nutritionally Significant Medications: [x] Reviewed & Includes: Zosyn, Protonix, fluconazole, Prednisone 20 mg/day; Zocor, lispro, normal sensitivity + NPH  Meal Intake: NPO, new TPN    Subjective:  UBW ~250# but lost about 20#.  I was eating good after first surgery (9/18), but eating differently. Eating yogurt and granola, and apples and PB.  Tolerate milk, but don't drink it. No ONS. Chew/swallow ok. No BM because not eating anything.     Objective:  Hx morbid obesity; HL, Rx Zocor; HTN; GERD; Type 2 DM; perforated ulcer 05/13/17 with surgical repair.  Admit with abdominal pain. GI noted leak s/p prior ulcer repair, percutaneous drainage; Rx PPI; NPO with TPN start D20%, 5% AA x 1 liter.    UBW 250# (BMI 42), morbid obesity; Admit 238# (BMI 41), reflects 16# (6%) unplanned wt loss x past ~60 days, which may have been attributed to healthier eating pattern post-op since no immediate change in appetite. Wt 254# (05/11/17) prior to perforated ulcer repair; Remote wt 250# 05/25/2016 confirms UBW 250# x past ~2 yrs.    NPO now with tx GI leak s/p prior gastric ulcer repair (9/18) x ~7wk post-op per MD.      TPN: D20%, AA 5% x 1 liter/day provides 50 gm pro and 887 total kcal.   POC BG 180 & 187; No insulin in TPN; Rx prednisone 20 mg/day and DTC noted. Rx insulin lispro + NPH.    Suggest decrease dextrose from 20% to 15% and add insulin to TPN if BG over 200.   GOAL: 2 Liter, (83 mL/hr) D15%, AA 5% with 20% lipid 3 x wk = 1848 total avg. Daily kcal. + 100 gm pro/day.    When PO resumed, RD to re-evaluate for pro/kcal needs and consider ONS PRN.       Estimated Nutrition Needs:   Kcals/day: ~1800-1900 Kcals/day   Protein:  109 gm (~2 gm/kg IBW)  Fluid: 2000 ml(~ 33m/kcal)     Based On: [place] Jeor(MSJ x 1-1.1); Morbid obesity  Weight Used: Actual wt(108.1 kg) kcal & IBW protein     Pt expected to meet estimated nutrient needs:  [x] Unable to predict at this time  Comparative Standards:  ;  ;      Nutrition Diagnosis:   1. Altered GI function related to leak s/p previous ulcer repair as evidenced by NPO, start TPN; initial surgical repair perforated ulcer 05/2017      Goals:     Resume PO with 80-100% intake of est. needs by discharge     Monitoring & Evaluation:    - Total energy intake, Protein intake, IV fluids   - Lean body mass, fat free mass, GI, Protein profile, Weight/weight change     Previous Nutrition Goals Met:  N/A  Previous Recommendations:  N/A    Education & Discharge Needs:   [x] None Identified   [x] Participated in care plan, discharge planning, and/or interdisciplinary rounds        Cultural, religious and ethnic food preferences identified:   None    Skin Integrity: [x]Intact   Edema: [x]None   Last BM: 07/01/17 PTA  Food Allergies: [x]NKFA; Tolerance: Tolerates lactose, but dislikes milk; likes yogurt    Anthropometrics:    Weight Loss Metrics 07/02/2017 07/01/2017 06/09/2017 05/24/2017 05/23/2017 05/16/2017 03/17/2017   Today's Wt 238 lb 5.1 oz 232 lb 236 lb 232 lb 235 lb 254 lb 3.1 oz 250 lb   BMI 40.91 kg/m2 39.82 kg/m2 40.51 kg/m2 39.82 kg/m2 36.81 kg/m2 39.81 kg/m2 43.59 kg/m2      Last 3 Recorded Weights in this Encounter    07/02/17 0113   Weight: 108.1 kg (238 lb 5.1 oz)      Weight Source: Standing scale (comment)  Height: 5' 4" (162.6 cm),    Body mass index is 40.91 kg/m??.  IBW : 54.4 kg (120 lb),    Usual Body Weight: 113.4 kg (250 lb),       Labs:    Lab Results   Component Value Date/Time    Sodium 134 (L) 07/04/2017 02:24 AM    Potassium 4.2 07/04/2017 02:24 AM    Chloride 100 07/04/2017 02:24 AM    CO2 25 07/04/2017 02:24 AM    Glucose 196 (H) 07/04/2017 02:24 AM    BUN 14 07/04/2017 02:24 AM    Creatinine 0.58 07/04/2017 02:24 AM    Calcium 8.9 07/04/2017 02:24 AM    Magnesium 1.6 07/03/2017 03:46 AM    Phosphorus 3.3 07/03/2017 03:46 AM    Albumin 2.3 (L) 07/04/2017 02:24 AM     Lab Results   Component Value Date/Time    Hemoglobin A1c 7.3 (H) 05/13/2017 03:04 AM    Hemoglobin A1c (POC) 8.4 (A) 07/01/2017 02:50 PM     Lab Results   Component Value Date/Time    Glucose 196 (H) 07/04/2017 02:24 AM    Glucose (POC) 180 (H) 07/05/2017 11:15 AM      Lab Results   Component Value Date/Time    ALT (SGPT) 21 07/04/2017 02:24 AM    AST (SGOT) 10 (L) 07/04/2017 02:24 AM    Alk. phosphatase 51 07/04/2017 02:24 AM    Bilirubin, total 1.1 (H) 07/04/2017 02:24 AM        Otho Perl, RD

## 2017-07-05 NOTE — Progress Notes (Signed)
TRANSFER - OUT REPORT:    Verbal report given to [place] Bart[place], RN on Neva SeatRebecca D Ruggirello  being transferred to 620 for routine progression of care       Report consisted of patient???s Situation, Background, Assessment and   Recommendations(SBAR).     Information from the following report(s) SBAR and Procedure Summary was reviewed with the receiving nurse.    Lines:   PICC Triple Lumen 07/05/17 Basilic;Right (Active)   Central Line Being Utilized Yes 07/05/2017 12:51 PM   Criteria for Appropriate Use Total parenteral nutrition 07/05/2017 12:51 PM   Site Assessment Clean, dry, & intact 07/05/2017 12:51 PM   Phlebitis Assessment 0 07/05/2017 12:51 PM   Infiltration Assessment 0 07/05/2017 12:51 PM   Arm Circumference (cm) 39.5 cm 07/05/2017 10:27 AM   Date of Last Dressing Change 07/05/17 07/05/2017 12:51 PM   Dressing Status New 07/05/2017 12:51 PM   External Catheter Length (cm) 0 centimeters 07/05/2017 10:27 AM   Dressing Type Disk with Chlorhexadine gluconate (CHG) 07/05/2017 12:51 PM   Hub Color/Line Status Red 07/05/2017 12:51 PM   Positive Blood Return (Site #1) Yes 07/05/2017 10:27 AM   Hub Color/Line Status White 07/05/2017 12:51 PM   Positive Blood Return (Site #2) Yes 07/05/2017 10:27 AM   Hub Color/Line Status Gray 07/05/2017 12:51 PM   Positive Blood Return (Site #3) Yes 07/05/2017 10:27 AM   Alcohol Cap Used Yes 07/05/2017 12:51 PM        Opportunity for questions and clarification was provided.      Patient transported with:   [employer]

## 2017-07-05 NOTE — Progress Notes (Signed)
Medical Progress Note      NAME: Dana [name]   DOB:  1956-09-05  MRM:  [id number]    Date/Time: 07/05/2017  8:17 AM    Problem List:   Principal Problem:    Abdominal pain (07/02/2017)    Active Problems:    HTN (hypertension) ()      Diabetes (HCC) (01/24/2013)      Acute gastric ulcer with perforation (HCC) (05/13/2017)           Subjective:     Patient feeling better.  Would like ice chips    Past Medical History:   Diagnosis Date   ??? Diabetes (HCC) 01/24/2013   ??? GERD (gastroesophageal reflux disease)    ??? Hives    ??? HTN (hypertension)    ??? Hypercholesterolemia    ??? Perforated gastric ulcer (HCC)        ROS:  Respiratory:  negative for cough, sputum production, SOB, wheezing, DOE, pleuritic pain  Cardiology:  negative for chest pain, palpitations, orthopnea, PND, edema, syncope   Gastrointestinal: positive for abd pain         Objective:       Vitals:          Last 24hrs VS reviewed since prior progress note. Most recent are:    Visit Vitals  BP 149/87 (BP 1 Location: Right arm, BP Patient Position: At rest)   Pulse 82   Temp 98.1 ??F (36.7 ??C)   Resp 17   Ht 5\' 4"  (1.626 m)   Wt 238 lb 5.1 oz (108.1 kg)   SpO2 93%   BMI 40.91 kg/m??     SpO2 Readings from Last 6 Encounters:   07/05/17 93%   06/09/17 98%   05/23/17 98%   05/18/17 93%   05/20/15 94%   11/30/10 100%    O2 Flow Rate (L/min): 21 l/min       Intake/Output Summary (Last 24 hours) at 07/05/2017 0817  Last data filed at 07/05/2017 0658  Gross per 24 hour   Intake 1200 ml   Output ???   Net 1200 ml          Exam:     General   Obese 61 yo wf  Respiratory   Dec bs in bses  Cardiology  regular  Abdominal  Soft, generalized tenderness, no bowel sounds  Extremities  No clubbing, cyanosis, or edema. Pulses intact.    Lab Data Reviewed: (see below)    Medications Reviewed: (see below)    ______________________________________________________________________    Medications:     Current Facility-Administered Medications   Medication Dose Route Frequency    ??? predniSONE (DELTASONE) tablet 10 mg  10 mg Oral BID   ??? HYDROmorphone (DILAUDID) injection 2 mg  2 mg IntraVENous Q3H PRN   ??? enoxaparin (LOVENOX) injection 40 mg  40 mg SubCUTAneous DAILY   ??? fluconazole (DIFLUCAN) 200mg /100 mL IVPB (premix)  200 mg IntraVENous Q24H   ??? 0.9% sodium chloride with KCl 20 mEq/L infusion   IntraVENous CONTINUOUS   ??? pantoprazole (PROTONIX) 40 mg in sodium chloride 0.9% 10 mL injection  40 mg IntraVENous Q12H   ??? piperacillin-tazobactam (ZOSYN) 3.375 g in 0.9% sodium chloride (MBP/ADV) 100 mL  3.375 g IntraVENous Q8H   ??? simvastatin (ZOCOR) tablet 40 mg  40 mg Oral QHS   ??? sertraline (ZOLOFT) tablet 100 mg  100 mg Oral DAILY   ??? sodium chloride (NS) flush 5-10 mL  5-10 mL IntraVENous Q8H   ???  sodium chloride (NS) flush 5-10 mL  5-10 mL IntraVENous PRN   ??? acetaminophen (TYLENOL) tablet 650 mg  650 mg Oral Q4H PRN   ??? ondansetron (ZOFRAN) injection 4 mg  4 mg IntraVENous Q4H PRN   ??? hydrALAZINE (APRESOLINE) 20 mg/mL injection 20 mg  20 mg IntraVENous Q6H PRN   ??? insulin lispro (HUMALOG) injection   SubCUTAneous AC&HS   ??? glucose chewable tablet 16 g  4 Tab Oral PRN   ??? dextrose (D50W) injection syrg 12.5-25 g  12.5-25 g IntraVENous PRN   ??? glucagon (GLUCAGEN) injection 1 mg  1 mg IntraMUSCular PRN   ??? cloNIDine HCl (CATAPRES) tablet 0.1 mg  0.1 mg Oral Q4H PRN   ??? insulin NPH (NOVOLIN N, HUMULIN N) injection 20 Units  20 Units SubCUTAneous QHS            Lab Review:     Recent Labs     07/04/17  0224 07/03/17  0346   WBC 9.3 4.7   HGB 11.8 13.0   HCT 38.0 41.5   PLT 168 165     Recent Labs     07/04/17  0224 07/03/17  0346   NA 134* 138   K 4.2 3.8   CL 100 105   CO2 25 22   GLU 196* 193*   BUN 14 17   CREA 0.58 0.56   CA 8.9 8.5   MG  --  1.6   PHOS  --  3.3   ALB 2.3* 2.5*   TBILI 1.1* 1.2*   SGOT 10* 8*   ALT 21 18     Lab Results   Component Value Date/Time    Glucose (POC) 187 (H) 07/05/2017 06:51 AM    Glucose (POC) 202 (H) 07/04/2017 09:18 PM     Glucose (POC) 183 (H) 07/04/2017 04:01 PM    Glucose (POC) 117 (H) 07/04/2017 11:11 AM    Glucose (POC) 163 (H) 07/04/2017 06:08 AM     No results for input(s): PH, PCO2, PO2, HCO3, FIO2 in the last 72 hours.  No results for input(s): INR in the last 72 hours.    No lab exists for component: INREXT    Other pertinent lab: NA         Assessment:     Patient Active Problem List   Diagnosis Code   ??? Hypercholesterolemia E78.00   ??? HTN (hypertension) I10   ??? Diabetes (HCC) E11.9   ??? Sleep apnea G47.30   ??? Hypertension complicating diabetes (HCC) E11.59, I10   ??? Morbid obesity due to excess calories (HCC) E66.01   ??? Hives L50.9   ??? Acute gastric ulcer with perforation (HCC) K25.1   ??? Arthralgia of both knees M25.561, M25.562   ??? Abdominal pain R10.9          Plan:                 1. + perforated bowel- await IR drainage  2. Diabetes- controlled while npo  3. Polyarthritis- taper steroids  4. Continue iv anti-hypertensive meds  5. Ambulate/ continue lovenox                ___________________________________________________    Attending Physician: Lake Bells, MD

## 2017-07-05 NOTE — Procedures (Signed)
PICC Placement Note : Order received and consent obtained from patient after explaining the reason for  PICC placement, the procedure,risks,benefits and potential complications. An opportunity was provided to ask questions and relay concerns. 5 fr Triple Power Solo PICC was placed using sterile technique and max barrier precautions at patients bedside. Modified Seldinger Technique with ultrasound guidance used to locate the vein. Vein accessed with 21G Introducer Safety Needle and guidewire easily thread. Sherlock PICC tip locator device used to determine PICC tip direction.    PRE-PROCEDURE VERIFICATION  Correct Procedure: yes  Correct Site:  yes  Temperature: Temp: 98.1 ??F (36.7 ??C), Temperature Source: Temp Source: Oral  Recent Labs     07/04/17  0224   BUN 14   CREA 0.58   PLT 168   WBC 9.3     Allergies: Adhesive and Dolobid [diflunisal]  Education materials, including PICC Booklet, for PICC Care given to patient: yes.   See Patient Education activity for further details.    PROCEDURE DETAIL  A triple lumen PICC line was started for TPN. The following documentation is in addition to the PICC properties in the lines/airways flowsheet :  Lot #: [id number]  Was xylocaine 1% used intradermally:  yes 1 ml used.  Catheter Length: 40 (cm) Circumference 39.5cm  Vein Selection for PICC:right basilic  Central Line Bundle followed yes  Complication Related to Insertion: none    The placement was verified by ECG technology. PICC is in the SVC per ECG waveform. Waveform documented in the paper chart.    Line is okay to use: yes    Sharon R Wood, RN

## 2017-07-05 NOTE — Progress Notes (Signed)
Bedside verbal shift change report given to oncoming nurse Martie LeeSabrina, R.N.  Opportunity for questions and clarifications provided.

## 2017-07-05 NOTE — Consults (Signed)
Consults by  Dagoberto Reef, MD at 07/05/17 1413                Author: Dagoberto Reef, MD  Service: Gastroenterology  Author Type: Physician       Filed: 07/05/17 1921  Date of Service: 07/05/17 1413  Status: Addendum          Editor: Dagoberto Reef, MD (Physician)          Related Notes: Original Note by Joylene John, PA (Physician Assistant) filed at 07/05/17  1635            Consult Orders        1. IP CONSULT TO GASTROENTEROLOGY [[id number]] ordered by Hoy Finlay, MD at 07/05/17 1112                                 [place]     [facility]    [address]   [place] [address]           GASTROENTEROLOGY CONSULTATION NOTE   Will Thad Ranger   [phone] office   [phone] NP/PA in-hospital cell phone M-F until 4:30PM   After 5PM or on weekends, please call operator for physician on call             NAME:  Dana [name]     DOB:   10-Jun-1956    MRN:   [id number]           Referring Physician: Dr. Hoy Finlay      Consult Date: 07/05/2017  2:13 PM      Chief Complaint: abdominal pain       History of Present Illness:  Patient is a  61 y.o. who is seen in consultation at the request of Dr. Amada Jupiter for recurrent ulcer.  Patient has a past medical history of diabetes mellitus, hypertension, and polymyalgia rheumatica on chronic steroid  therapy.  She is status post exploratory laparotomy with repair of perforated posterior ulcer with oversew and onlay omental patch on 05/13/17.  She presented to the ED with complaints of abdominal pain.  Patient was admitted to the hospital on 07/02/17.      Patient complains of a sudden onset of lower abdominal pain that started at the end of last week.  Pain has been concentrated to the lower abdomen, right side worse than left.  She describes the pain as a constant sharp, stabbing sensation.  Pain is worse  with movement.  No nausea, reflux, or vomiting.  No diarrhea or constipation.  No melena or hematochezia.  No fevers or chills.  Patient  was seen in our office last week and was doing well at that time.  She had been scheduled for an EGD.      No NSAID use since 05/2017.  No anticoagulation.  Rare alcohol use.  Occasional tobacco use (3 cigarettes per day, previously she was smoking less than 1 pack per day).  No history of EGD.  Colonoscopy (04/28/17) by Dr. Dagmar Hait showed mild diverticulosis  of the left side of the colon.  A repeat colonoscopy was recommended in 10 years.        I have reviewed the emergency room note, hospital admission note, notes by all other clinicians who have seen the patient during this hospitalization to date. I have reviewed the problem list and the  reason for this hospitalization. I have reviewed the  allergies and the medications the patient was taking at home prior to this hospitalization.      PMH:     Past Medical History:        Diagnosis  Date         ?  Diabetes (HCC)  01/24/2013     ?  GERD (gastroesophageal reflux disease)       ?  Hives       ?  HTN (hypertension)       ?  Hypercholesterolemia           ?  Perforated gastric ulcer (HCC)             PSH:     Past Surgical History:         Procedure  Laterality  Date          ?  ABDOMEN SURGERY PROC UNLISTED    05/12/2017          Lap exploratory perforated posterior ulcer repair by Dr. Amada JupiterShindel           ?  HX HYSTERECTOMY               Allergies:     Allergies        Allergen  Reactions         ?  Adhesive  Itching     ?  Dolobid [Diflunisal]  Itching             Itching,swelling           Home Medications:     Prior to Admission Medications     Prescriptions  Last Dose  Informant  Patient Reported?  Taking?      CARAFATE 100 mg/mL suspension      No  No      Sig: TAKE 10 ML BY MOUTH FOUR (4) TIMES DAILY FOR 28 DAYS. INDICATIONS: GASTRIC ULCER      insulin NPH (HUMULIN N NPH INSULIN KWIKPEN) 100 unit/mL (3 mL) inpn      No  No      Sig: INJECT 32 UNITS SUBCUTANEOUSLY AT BEDTIME      Patient taking differently: 40 Units. INJECT 32 UNITS SUBCUTANEOUSLY AT BEDTIME       lidocaine (LIDODERM) 5 %      Yes  No      Sig: 1 Patch by TransDERmal route every twelve (12) hours as needed. Apply patch to the affected area for 12 hours a day and remove for 12 hours a  day.      metFORMIN ER (GLUCOPHAGE XR) 500 mg tablet      No  No      Sig: TAKE 1 TABLET EVERY MORNING AND 2 TABLETS WITH DINNER      Patient taking differently: 2 bid      pantoprazole (PROTONIX) 40 mg tablet      No  No      Sig: Take 1 Tab by mouth Daily (before breakfast).      predniSONE (DELTASONE) 20 mg tablet      No  No      Sig: Take 1 Tab by mouth two (2) times a day.      sertraline (ZOLOFT) 100 mg tablet      No  No      Sig: TAKE 1 TABLET BY MOUTH DAILY      simvastatin (ZOCOR) 40 mg tablet      No  No  Sig: TAKE 1 TABLET NIGHTLY      traMADol (ULTRAM) 50 mg tablet      No  No      Sig: Take 1 Tab by mouth every eight (8) hours as needed for Pain. Max Daily Amount: 150 mg.      triamterene-hydroCHLOROthiazide (MAXZIDE) 37.5-25 mg per tablet      No  No      Sig: TAKE 1 TABLET DAILY               Facility-Administered Medications: None           Hospital Medications:     Current Facility-Administered Medications          Medication  Dose  Route  Frequency           ?  predniSONE (DELTASONE) tablet 10 mg   10 mg  Oral  BID     ?  sodium chloride (NS) flush 20 mL   20 mL  InterCATHeter  PRN     ?  sodium chloride (NS) flush 10 mL   10 mL  InterCATHeter  Q24H     ?  sodium chloride (NS) flush 10 mL   10 mL  InterCATHeter  PRN     ?  sodium chloride (NS) flush 10 mL   10 mL  InterCATHeter  Q8H     ?  alteplase (CATHFLO) 1 mg in sterile water (preservative free) 1 mL injection   1 mg  InterCATHeter  PRN     ?  bacitracin 500 unit/gram packet 1 Packet   1 Packet  Topical  PRN     ?  TPN ADULT - CENTRAL     IntraVENous  CONTINUOUS     ?  [START ON 07/06/2017] fat emulsion 20% (LIPOSYN, INTRAlipid) infusion 500 mL   500 mL  IntraVENous  Q MON, WED & FRI     ?  HYDROmorphone (DILAUDID) injection 2 mg   2 mg   IntraVENous  Q3H PRN     ?  enoxaparin (LOVENOX) injection 40 mg   40 mg  SubCUTAneous  DAILY     ?  fluconazole (DIFLUCAN) 200mg /100 mL IVPB (premix)   200 mg  IntraVENous  Q24H     ?  0.9% sodium chloride with KCl 20 mEq/L infusion     IntraVENous  CONTINUOUS     ?  pantoprazole (PROTONIX) 40 mg in sodium chloride 0.9% 10 mL injection   40 mg  IntraVENous  Q12H     ?  piperacillin-tazobactam (ZOSYN) 3.375 g in 0.9% sodium chloride (MBP/ADV) 100 mL   3.375 g  IntraVENous  Q8H     ?  simvastatin (ZOCOR) tablet 40 mg   40 mg  Oral  QHS           ?  sertraline (ZOLOFT) tablet 100 mg   100 mg  Oral  DAILY           ?  sodium chloride (NS) flush 5-10 mL   5-10 mL  IntraVENous  Q8H     ?  sodium chloride (NS) flush 5-10 mL   5-10 mL  IntraVENous  PRN     ?  acetaminophen (TYLENOL) tablet 650 mg   650 mg  Oral  Q4H PRN     ?  ondansetron (ZOFRAN) injection 4 mg   4 mg  IntraVENous  Q4H PRN     ?  hydrALAZINE (APRESOLINE) 20 mg/mL injection 20 mg  20 mg  IntraVENous  Q6H PRN     ?  insulin lispro (HUMALOG) injection     SubCUTAneous  AC&HS     ?  glucose chewable tablet 16 g   4 Tab  Oral  PRN     ?  dextrose (D50W) injection syrg 12.5-25 g   12.5-25 g  IntraVENous  PRN     ?  glucagon (GLUCAGEN) injection 1 mg   1 mg  IntraMUSCular  PRN     ?  cloNIDine HCl (CATAPRES) tablet 0.1 mg   0.1 mg  Oral  Q4H PRN           ?  insulin NPH (NOVOLIN N, HUMULIN N) injection 20 Units   20 Units  SubCUTAneous  QHS           Social History:     Social History          Tobacco Use         ?  Smoking status:  Former Smoker              Packs/day:  1.00         Last attempt to quit:  06/27/2011         Years since quitting:  6.0         ?  Smokeless tobacco:  Never Used       Substance Use Topics         ?  Alcohol use:  Yes              Alcohol/week:  0.6 - 1.2 oz         Types:  1 - 2 Glasses of wine per week             Comment: Per month            Family History:     Family History         Problem  Relation  Age of Onset          ?   Hypertension  Mother       ?  Cancer  Father                mesothelioma          ?  Diabetes  Father            ?  Hypertension  Father             Review of Systems:   Constitutional: negative fever, negative chills, negative weight loss   Eyes:   negative visual changes   ENT:   negative sore throat, tongue or lip swelling   Respiratory:  negative cough, negative dyspnea   Cards:  negative for chest pain, palpitations, lower extremity edema   GI:   See HPI   GU:  negative for frequency, dysuria   Integument:  negative for rash and pruritus   Heme:  negative for easy bruising and gum/nose bleeding   Musculoskeletal:negative for myalgias, back pain and muscle weakness   Neuro:    negative for headaches, dizziness, vertigo   Psych: negative for feelings of anxiety, depression         Objective:        Patient Vitals for the past 8 hrs:            BP  Temp  Pulse  Resp  SpO2            07/05/17  1251  (!) 159/99  98.8 ??F (37.1 ??C)  88  16  93 %            07/05/17 1203  (!) 166/99  98 ??F (36.7 ??C)  81  16  93 %            07/05/17 0810  149/87  98.1 ??F (36.7 ??C)  82  17  93 %        No intake/output data recorded.   10/28 1901 - 10/30 0700   In: 1200 [I.V.:1200]   Out: -       EXAM:      CONST:  Pleasant female lying in bed, no acute distress    NEURO:  Alert and oriented x 3    HEENT: EOMI, no scleral icterus    LUNGS: CTA bilaterally anteriorly    CARD:  S1 S2    ABD:  Soft, obese, mildly distended, RLQ and LLQ tenderness, no rebound, no guarding. + Bowel sounds.     EXT:  Warm    PSYCH: Full, not anxious       Data Review         Recent Labs            07/05/17   1152  07/04/17   0224     WBC  10.7  9.3     HGB  10.8*  11.8     HCT  34.9*  38.0         PLT  201  168          Recent Labs             07/05/17   1152  07/04/17   0224  07/03/17   0346     NA  137  134*  138     K  4.2  4.2  3.8     CL  101  100  105     CO2  26  25  22      BUN  16  14  17      CREA  0.46*  0.58  0.56     GLU  165*  196*  193*     PHOS   2.5*   --   3.3          CA  8.5  8.9  8.5          Recent Labs            07/04/17   0224  07/03/17   0346     SGOT  10*  8*     AP  51  44*     TP  6.4  6.3*     ALB  2.3*  2.5*         GLOB  4.1*  3.8        No results for input(s): INR, PTP, APTT in the last 72 hours.      No lab exists for component: INREXT            Assessment:     ??    Abdominal pain: UGI series (07/02/17): no extravasation of contrast at the site of recent gastric ulcer repair to suggest reperforation.  CT abdomen/pelvis with IV and oral contrast (07/04/17): evidence consistent with increasing perforation viscus in the right upper quadrant with increasing gas, extravasated contrast, and small amounts of fluid. WBC 10.7, Hgb 10.8, AST: 10, ALT: 21, alkaline  phosphatase: 51,  total bilirubin: 1.1, lactic acid: 1.3.    ??  History of perforated gastric ulcer with repair (05/2017)          Patient Active Problem List        Diagnosis  Code         ?  Hypercholesterolemia  E78.00     ?  HTN (hypertension)  I10     ?  Diabetes (HCC)  E11.9     ?  Sleep apnea  G47.30     ?  Hypertension complicating diabetes (HCC)  E11.59, I10     ?  Morbid obesity due to excess calories (HCC)  E66.01     ?  Hives  L50.9     ?  Acute gastric ulcer with perforation (HCC)  K25.1     ?  Arthralgia of both knees  M25.561, M25.562         ?  Abdominal pain  R10.9          Plan:          ??  NPO   ??  TPN ordered   ??  PPI BID   ??  Continue supportive measures   ??  On antibiotics   ??  Per surgery, plan for percutaneous drainage   ??  Plan for outpatient EGD after clearance from surgery   ??  Thank you for allowing me to participate in care of Neva Seat           Signed By:  Joylene John, PA        07/05/2017  2:13 PM           GI attending note:      Gen: NAD; Cardiac: nml S1, S2; Pulm: CTA B; Abd: decreased BS, tender in the R side of the abdomen, nd, no rebound/invol guarding. Vitals, labs, and imaging reviewed. Agree with the history, physical, and plan of the APP.  S/p IR drain. PPI. Plan for outpatient  EGD after acute issues dealt with. Will sign off. Please call with any questions.       Thank you for this consultation.      Dr. Dagmar Hait

## 2017-07-05 NOTE — Procedures (Signed)
 PICC Placement Note : Order received and consent obtained from patient after explaining the reason for  PICC placement, the procedure,risks,benefits and potential complications. An opportunity was provided to ask questions and relay concerns. 5 fr Triple Power Solo PICC was placed using sterile technique and max barrier precautions at patients bedside. Modified Seldinger Technique with ultrasound guidance used to locate the vein. Vein accessed with 21G Introducer Safety Needle and guidewire easily thread. Sherlock PICC tip locator device used to determine PICC tip direction.    PRE-PROCEDURE VERIFICATION  Correct Procedure: yes  Correct Site:  yes  Temperature: Temp: 98.1 F (36.7 C), Temperature Source: Temp Source: Oral  Recent Labs     07/04/17  0224   BUN 14   CREA 0.58   PLT 168   WBC 9.3     Allergies: Adhesive and Dolobid [diflunisal]  Education materials, including PICC Booklet, for PICC Care given to patient: yes.   See Patient Education activity for further details.    PROCEDURE DETAIL  A triple lumen PICC line was started for TPN. The following documentation is in addition to the PICC properties in the lines/airways flowsheet :  Lot #: [id number]  Was xylocaine 1% used intradermally:  yes 1 ml used.  Catheter Length: 40 (cm) Circumference 39.5cm  Vein Selection for PICC:right basilic  Central Line Bundle followed yes  Complication Related to Insertion: none    The placement was verified by ECG technology. PICC is in the SVC per ECG waveform. Waveform documented in the paper chart.    Line is okay to use: yes    Reena JONELLE Devonshire, RN

## 2017-07-06 LAB — GLUCOSE, POC
Glucose (POC): 211 mg/dL — ABNORMAL HIGH (ref 65–100)
Glucose (POC): 279 mg/dL — ABNORMAL HIGH (ref 65–100)
Glucose (POC): 202 mg/dL — ABNORMAL HIGH (ref 65–100)
Glucose (POC): 292 mg/dL — ABNORMAL HIGH (ref 65–100)

## 2017-07-06 LAB — CBC WITH AUTOMATED DIFF
ABS. BASOPHILS: 0 10*3/uL (ref 0.0–0.1)
ABS. EOSINOPHILS: 0 10*3/uL (ref 0.0–0.4)
ABS. IMM. GRANS.: 0.1 10*3/uL — ABNORMAL HIGH (ref 0.00–0.04)
ABS. LYMPHOCYTES: 0.8 10*3/uL (ref 0.8–3.5)
ABS. MONOCYTES: 0.7 10*3/uL (ref 0.0–1.0)
ABS. NEUTROPHILS: 9 10*3/uL — ABNORMAL HIGH (ref 1.8–8.0)
ABSOLUTE NRBC: 0 10*3/uL (ref 0.00–0.01)
BASOPHILS: 0 % (ref 0–1)
EOSINOPHILS: 0 % (ref 0–7)
HCT: 33.3 % — ABNORMAL LOW (ref 35.0–47.0)
HGB: 10.3 g/dL — ABNORMAL LOW (ref 11.5–16.0)
IMMATURE GRANULOCYTES: 1 % — ABNORMAL HIGH (ref 0.0–0.5)
LYMPHOCYTES: 7 % — ABNORMAL LOW (ref 12–49)
MCH: 29.7 PG (ref 26.0–34.0)
MCHC: 30.9 g/dL (ref 30.0–36.5)
MCV: 96 FL (ref 80.0–99.0)
MONOCYTES: 6 % (ref 5–13)
MPV: 9.6 FL (ref 8.9–12.9)
NEUTROPHILS: 85 % — ABNORMAL HIGH (ref 32–75)
NRBC: 0 PER 100 WBC
PLATELET: 207 10*3/uL (ref 150–400)
RBC: 3.47 M/uL — ABNORMAL LOW (ref 3.80–5.20)
RDW: 15.4 % — ABNORMAL HIGH (ref 11.5–14.5)
WBC: 10.6 10*3/uL (ref 3.6–11.0)

## 2017-07-06 LAB — METABOLIC PANEL, BASIC
Anion gap: 9 mmol/L (ref 5–15)
BUN/Creatinine ratio: 31 — ABNORMAL HIGH (ref 12–20)
BUN: 17 MG/DL (ref 6–20)
CO2: 26 mmol/L (ref 21–32)
Calcium: 8.1 MG/DL — ABNORMAL LOW (ref 8.5–10.1)
Chloride: 103 mmol/L (ref 97–108)
Creatinine: 0.55 MG/DL (ref 0.55–1.02)
GFR est AA: >60 mL/min/{1.73_m2} (ref >60)
GFR est non-AA: >60 mL/min/{1.73_m2} (ref >60)
Glucose: 257 mg/dL — ABNORMAL HIGH (ref 65–100)
Potassium: 3.9 mmol/L (ref 3.5–5.1)
Sodium: 138 mmol/L (ref 136–145)

## 2017-07-06 LAB — PHOSPHORUS: Phosphorus: 2.8 MG/DL (ref 2.6–4.7)

## 2017-07-06 LAB — MAGNESIUM: Magnesium: 2 mg/dL (ref 1.6–2.4)

## 2017-07-06 MED ORDER — INSULIN GLARGINE 100 UNIT/ML INJECTION
100 unit/mL | Freq: Every day | SUBCUTANEOUS | Status: DC
Start: 2017-07-06 — End: 2017-07-06

## 2017-07-06 MED ORDER — PREDNISONE 10 MG TAB
10 mg | Freq: Every day | ORAL | Status: DC
Start: 2017-07-06 — End: 2017-07-07
  Administered 2017-07-07 (×2): via ORAL

## 2017-07-06 MED ORDER — POTASSIUM ACETATE 2 MEQ/ML IV
2 mEq/mL | INTRAVENOUS | Status: DC
Start: 2017-07-06 — End: 2017-07-07
  Administered 2017-07-07: via INTRAVENOUS

## 2017-07-06 MED FILL — SIMVASTATIN 20 MG TAB: 20 mg | ORAL | Qty: 2

## 2017-07-06 MED FILL — SERTRALINE 50 MG TAB: 50 mg | ORAL | Qty: 2

## 2017-07-06 MED FILL — PROTONIX 40 MG INTRAVENOUS SOLUTION: 40 mg | INTRAVENOUS | Qty: 40

## 2017-07-06 MED FILL — NORMAL SALINE FLUSH 0.9 % INJECTION SYRINGE: INTRAMUSCULAR | Qty: 10

## 2017-07-06 MED FILL — PIPERACILLIN-TAZOBACTAM 3.375 GRAM IV SOLR: 3.375 gram | INTRAVENOUS | Qty: 3.38

## 2017-07-06 MED FILL — INSULIN LISPRO 100 UNIT/ML INJECTION: 100 unit/mL | SUBCUTANEOUS | Qty: 1

## 2017-07-06 MED FILL — HYDROMORPHONE 2 MG/ML INJECTION SOLUTION: 2 mg/mL | INTRAMUSCULAR | Qty: 1

## 2017-07-06 MED FILL — INSULIN NPH HUMAN RECOMB 100 UNIT/ML INJECTION: 100 unit/mL | SUBCUTANEOUS | Qty: 1

## 2017-07-06 MED FILL — INTRALIPID 20 % INTRAVENOUS EMULSION: 20 % | INTRAVENOUS | Qty: 500

## 2017-07-06 MED FILL — FLUCONAZOLE IN SALINE (ISO-OSMOTIC) 200 MG/100 ML IV PIGGY BACK: 200 mg/100 mL | INTRAVENOUS | Qty: 100

## 2017-07-06 MED FILL — AMINOSYN II 10 % IV: 10 % | INTRAVENOUS | Qty: 996

## 2017-07-06 MED FILL — ONDANSETRON (PF) 4 MG/2 ML INJECTION: 4 mg/2 mL | INTRAMUSCULAR | Qty: 2

## 2017-07-06 MED FILL — NS WITH POTASSIUM CHLORIDE 20 MEQ/L IV: 20 mEq/L | INTRAVENOUS | Qty: 1000

## 2017-07-06 MED FILL — ENOXAPARIN 40 MG/0.4 ML SUB-Q SYRINGE: 40 mg/0.4 mL | SUBCUTANEOUS | Qty: 0.4

## 2017-07-06 MED FILL — PREDNISONE 10 MG TAB: 10 mg | ORAL | Qty: 1

## 2017-07-06 NOTE — Progress Notes (Signed)
Problem: Falls - Risk of  Goal: *Absence of Falls  Document Schmid Fall Risk and appropriate interventions in the flowsheet.  Outcome: Progressing Towards Goal  Fall Risk Interventions:  Mobility Interventions: Communicate number of staff needed for ambulation/transfer    Medication Interventions: Patient to call before getting OOB    Elimination Interventions: Call light in reach, Patient to call for help with toileting needs        Problem: Pressure Injury - Risk of  Goal: *Prevention of pressure injury  Document Braden Scale and appropriate interventions in the flowsheet.  Outcome: Progressing Towards Goal  Pressure Injury Interventions:  Sensory Interventions: Assess need for specialty bed    Moisture Interventions: Absorbent underpads    Activity Interventions: Pressure redistribution bed/mattress(bed type)    Mobility Interventions: Pressure redistribution bed/mattress (bed type)    Nutrition Interventions: Document food/fluid/supplement intake    Friction and Shear Interventions: Lift sheet

## 2017-07-06 NOTE — Other (Signed)
DTC Progress Note    Recommendations/ Comments: Chart reviewed on Dana [name] for hyperglycemia. Pt admitted d/t RUQ abdominal pain workup. Pt on steroids PTA, currently on Prednisone 10 mg BID.  BG >180 mg/dl x 2 over the last 24 hours.  Pt remains NPO and TPN was initiated now at 83 ml/hr, receiving 298 grams of dextrose. No insulin on TPN.    If appropriate, please consider:  Addition of regular insulin to next TPN bag, 8 units/L for total dose of 16 units/day    Current hospital DM medication:  NPH, 20 units HS   Lispro, normal scale - 12 units of correction received x 24 hours    Patient is a 61 y.o. female with known history of DM on NPH and Metformin at home.    A1c:   Lab Results   Component Value Date/Time    Hemoglobin A1c 7.3 (H) 05/13/2017 03:04 AM    Hemoglobin A1c 7.2 (H) 01/31/2015 03:52 PM       Recent Glucose Results:   Lab Results   Component Value Date/Time    GLU 257 (H) 07/06/2017 03:11 AM    GLUCPOC 202 (H) 07/06/2017 11:15 AM    GLUCPOC 279 (H) 07/06/2017 06:45 AM    GLUCPOC 211 (H) 07/05/2017 09:52 PM        Lab Results   Component Value Date/Time    Creatinine 0.55 07/06/2017 03:11 AM     Estimated Creatinine Clearance: 134.2 mL/min (based on SCr of 0.55 mg/dL).    Active Orders   Diet    DIET NPO With Ice Chips        PO intake: No data found.    Will continue to follow as needed.    Thank you,  Burlene ArntNeysa Serra-Valentin, RD, CDE  [facility]  Pager: [phone]

## 2017-07-06 NOTE — Progress Notes (Signed)
Progress Note    Patient: Dana SeatRebecca D Face MRN: [id number]  SSN: [id number]    Date of Birth: 06-16-1956  Age: 61 y.o.  Sex: female      Admit Date: 07/02/2017    * No surgery found *    Procedure:      Subjective:     Patient states she had a good night. She is still having pain. She noted increased drainage when the drain was flushed.     Objective:     Visit Vitals  BP 124/74 (BP 1 Location: Left arm, BP Patient Position: At rest)   Pulse 75   Temp 97.5 ??F (36.4 ??C)   Resp 18   Ht 5\' 4"  (1.626 m)   Wt 244 lb 11.2 oz (111 kg)   SpO2 94%   BMI 42.00 kg/m??       Temp (24hrs), Avg:98.2 ??F (36.8 ??C), Min:97.5 ??F (36.4 ??C), Max:98.7 ??F (37.1 ??C)      Physical Exam:    Gen- Alert in NAD  Brownish drainage from the drain.   Abd- Still with RUQ tenderness./    Data Review: images and reports reviewed    Lab Review:   All lab results for the last 24 hours reviewed.  Recent Results (from the past 24 hour(s))   CULTURE, BODY FLUID W GRAM STAIN    Collection Time: 07/05/17  4:25 PM   Result Value Ref Range    Special Requests: NO SPECIAL REQUESTS      GRAM STAIN 2+ WBCS SEEN      GRAM STAIN OCCASIONAL GRAM NEGATIVE RODS      GRAM STAIN OCCASIONAL GRAM POSITIVE RODS      GRAM STAIN OCCASIONAL GRAM POSITIVE COCCI IN PAIRS      GRAM STAIN RARE GRAM POSITIVE COCCI IN CHAINS      Culture result: HEAVY GRAM NEGATIVE RODS (A)     SAMPLES BEING HELD    Collection Time: 07/05/17  4:25 PM   Result Value Ref Range    SAMPLES BEING HELD 1swab     COMMENT        Add-on orders for these samples will be processed based on acceptable specimen integrity and analyte stability, which may vary by analyte.   GLUCOSE, POC    Collection Time: 07/05/17  9:52 PM   Result Value Ref Range    Glucose (POC) 211 (H) 65 - 100 mg/dL    Performed by [place]    CBC WITH AUTOMATED DIFF    Collection Time: 07/06/17  3:11 AM   Result Value Ref Range    WBC 10.6 3.6 - 11.0 K/uL    RBC 3.47 (L) 3.80 - 5.20 M/uL    HGB 10.3 (L) 11.5 - 16.0 g/dL     HCT 78.233.3 (L) 95.635.0 - 47.0 %    MCV 96.0 80.0 - 99.0 FL    MCH 29.7 26.0 - 34.0 PG    MCHC 30.9 30.0 - 36.5 g/dL    RDW 21.315.4 (H) 08.611.5 - 14.5 %    PLATELET 207 150 - 400 K/uL    MPV 9.6 8.9 - 12.9 FL    NRBC 0.0 0 PER 100 WBC    ABSOLUTE NRBC 0.00 0.00 - 0.01 K/uL    NEUTROPHILS 85 (H) 32 - 75 %    LYMPHOCYTES 7 (L) 12 - 49 %    MONOCYTES 6 5 - 13 %    EOSINOPHILS 0 0 - 7 %  BASOPHILS 0 0 - 1 %    IMMATURE GRANULOCYTES 1 (H) 0.0 - 0.5 %    ABS. NEUTROPHILS 9.0 (H) 1.8 - 8.0 K/UL    ABS. LYMPHOCYTES 0.8 0.8 - 3.5 K/UL    ABS. MONOCYTES 0.7 0.0 - 1.0 K/UL    ABS. EOSINOPHILS 0.0 0.0 - 0.4 K/UL    ABS. BASOPHILS 0.0 0.0 - 0.1 K/UL    ABS. IMM. GRANS. 0.1 (H) 0.00 - 0.04 K/UL    DF AUTOMATED     METABOLIC PANEL, BASIC    Collection Time: 07/06/17  3:11 AM   Result Value Ref Range    Sodium 138 136 - 145 mmol/L    Potassium 3.9 3.5 - 5.1 mmol/L    Chloride 103 97 - 108 mmol/L    CO2 26 21 - 32 mmol/L    Anion gap 9 5 - 15 mmol/L    Glucose 257 (H) 65 - 100 mg/dL    BUN 17 6 - 20 MG/DL    Creatinine 1.61 0.96 - 1.02 MG/DL    BUN/Creatinine ratio 31 (H) 12 - 20      GFR est AA >60 >60 ml/min/1.58m2    GFR est non-AA >60 >60 ml/min/1.7m2    Calcium 8.1 (L) 8.5 - 10.1 MG/DL   MAGNESIUM    Collection Time: 07/06/17  3:11 AM   Result Value Ref Range    Magnesium 2.0 1.6 - 2.4 mg/dL   PHOSPHORUS    Collection Time: 07/06/17  3:11 AM   Result Value Ref Range    Phosphorus 2.8 2.6 - 4.7 MG/DL   GLUCOSE, POC    Collection Time: 07/06/17  6:45 AM   Result Value Ref Range    Glucose (POC) 279 (H) 65 - 100 mg/dL    Performed by Cherlyn Labella    GLUCOSE, POC    Collection Time: 07/06/17 11:15 AM   Result Value Ref Range    Glucose (POC) 202 (H) 65 - 100 mg/dL    Performed by Lenoria Farrier        Assessment:     Hospital Problems  Date Reviewed: 29-Jul-2017          Codes Class Noted POA    * (Principal) Abdominal pain ICD-10-CM: R10.9  ICD-9-CM: 789.00  07/02/2017 Yes         Acute gastric ulcer with perforation (HCC) ICD-10-CM: K25.1  ICD-9-CM: 531.10  05/13/2017 Yes        Diabetes (HCC) ICD-10-CM: E11.9  ICD-9-CM: 250.00  01/24/2013 Yes        HTN (hypertension) ICD-10-CM: I10  ICD-9-CM: 401.9  Unknown Yes              Plan/Recommendations/Medical Decision Making:   Would keep only on ice chips for now until we are a little more sure that the perforation has sealed.  Will continue TPN.  Continue ABX  Await culture results   Hopefully she can come offf steroids soon.   Signed By: Hoy Finlay, MD     July 06, 2017

## 2017-07-06 NOTE — Progress Notes (Signed)
..  Bedside shift change report given to Emnet Monk, RN (oncoming nurse) by Darnell, RN (offgoing nurse). Report included the following information SBAR, Kardex and MAR.

## 2017-07-06 NOTE — Progress Notes (Signed)
Problem: Falls - Risk of  Goal: *Absence of Falls  Document Schmid Fall Risk and appropriate interventions in the flowsheet.  Outcome: Progressing Towards Goal  Fall Risk Interventions:  Mobility Interventions: Communicate number of staff needed for ambulation/transfer    Medication Interventions: Patient to call before getting OOB    Elimination Interventions: Call light in reach, Patient to call for help with toileting needs             Problem: Pressure Injury - Risk of  Goal: *Prevention of pressure injury  Document Braden Scale and appropriate interventions in the flowsheet.  Outcome: Progressing Towards Goal  Pressure Injury Interventions:  Sensory Interventions: Assess need for specialty bed    Moisture Interventions: Absorbent underpads    Activity Interventions: Pressure redistribution bed/mattress(bed type)    Mobility Interventions: Pressure redistribution bed/mattress (bed type)    Nutrition Interventions: Document food/fluid/supplement intake    Friction and Shear Interventions: Lift sheet

## 2017-07-06 NOTE — Progress Notes (Signed)
Medical Progress Note      NAME: Dana [name]   DOB:  20-Oct-1955  MRM:  [id number]    Date/Time: 07/06/2017  6:32 PM    Problem List:   Principal Problem:    Abdominal pain (07/02/2017)    Active Problems:    HTN (hypertension) ()      Diabetes (HCC) (01/24/2013)      Acute gastric ulcer with perforation (HCC) (05/13/2017)           Subjective:     Patient c/o being hungry. Pain controlled    Past Medical History:   Diagnosis Date   ??? Diabetes (HCC) 01/24/2013   ??? GERD (gastroesophageal reflux disease)    ??? Hives    ??? HTN (hypertension)    ??? Hypercholesterolemia    ??? Perforated gastric ulcer (HCC)        ROS:  General: negative for fever, chills, sweats, weakness  Respiratory:  negative for cough, sputum production, SOB, wheezing, DOE, pleuritic pain  Cardiology:  negative for chest pain, palpitations, orthopnea, PND, edema, syncope   Gastrointestinal: positive for abd pain         Objective:       Vitals:          Last 24hrs VS reviewed since prior progress note. Most recent are:    Visit Vitals  BP (!) 157/99 (BP 1 Location: Left arm, BP Patient Position: Sitting)   Pulse 78   Temp 98.6 ??F (37 ??C)   Resp 18   Ht 5\' 4"  (1.626 m)   Wt 244 lb 11.2 oz (111 kg)   SpO2 93%   BMI 42.00 kg/m??     SpO2 Readings from Last 6 Encounters:   07/06/17 93%   06/09/17 98%   05/23/17 98%   05/18/17 93%   05/20/15 94%   11/30/10 100%    O2 Flow Rate (L/min): 3 l/min   No intake or output data in the 24 hours ending 07/06/17 1832       Exam:     General   Obese wf-   Respiratory   Clear To Auscultation bilaterally - no wheezes, rales, rhonchi, or crackles  Cardiology  regular  Abdominal  Soft +bs.  Generalized tenderness  Extremities  No clubbing, cyanosis, or edema. Pulses intact.    Lab Data Reviewed: (see below)    Medications Reviewed: (see below)    ______________________________________________________________________    Medications:     Current Facility-Administered Medications   Medication Dose Route Frequency    ??? TPN ADULT - CENTRAL   IntraVENous CONTINUOUS   ??? [START ON 07/07/2017] predniSONE (DELTASONE) tablet 10 mg  10 mg Oral DAILY WITH BREAKFAST   ??? sodium chloride (NS) flush 20 mL  20 mL InterCATHeter PRN   ??? sodium chloride (NS) flush 10 mL  10 mL InterCATHeter Q24H   ??? sodium chloride (NS) flush 10 mL  10 mL InterCATHeter PRN   ??? sodium chloride (NS) flush 10 mL  10 mL InterCATHeter Q8H   ??? alteplase (CATHFLO) 1 mg in sterile water (preservative free) 1 mL injection  1 mg InterCATHeter PRN   ??? bacitracin 500 unit/gram packet 1 Packet  1 Packet Topical PRN   ??? TPN ADULT - CENTRAL   IntraVENous CONTINUOUS   ??? fat emulsion 20% (LIPOSYN, INTRAlipid) infusion 500 mL  500 mL IntraVENous Q MON, WED & FRI   ??? HYDROmorphone (DILAUDID) injection 2 mg  2 mg IntraVENous Q3H PRN   ???  enoxaparin (LOVENOX) injection 40 mg  40 mg SubCUTAneous DAILY   ??? fluconazole (DIFLUCAN) 200mg /100 mL IVPB (premix)  200 mg IntraVENous Q24H   ??? 0.9% sodium chloride with KCl 20 mEq/L infusion   IntraVENous CONTINUOUS   ??? pantoprazole (PROTONIX) 40 mg in sodium chloride 0.9% 10 mL injection  40 mg IntraVENous Q12H   ??? piperacillin-tazobactam (ZOSYN) 3.375 g in 0.9% sodium chloride (MBP/ADV) 100 mL  3.375 g IntraVENous Q8H   ??? simvastatin (ZOCOR) tablet 40 mg  40 mg Oral QHS   ??? sertraline (ZOLOFT) tablet 100 mg  100 mg Oral DAILY   ??? sodium chloride (NS) flush 5-10 mL  5-10 mL IntraVENous Q8H   ??? sodium chloride (NS) flush 5-10 mL  5-10 mL IntraVENous PRN   ??? acetaminophen (TYLENOL) tablet 650 mg  650 mg Oral Q4H PRN   ??? ondansetron (ZOFRAN) injection 4 mg  4 mg IntraVENous Q4H PRN   ??? hydrALAZINE (APRESOLINE) 20 mg/mL injection 20 mg  20 mg IntraVENous Q6H PRN   ??? insulin lispro (HUMALOG) injection   SubCUTAneous AC&HS   ??? glucose chewable tablet 16 g  4 Tab Oral PRN   ??? dextrose (D50W) injection syrg 12.5-25 g  12.5-25 g IntraVENous PRN   ??? glucagon (GLUCAGEN) injection 1 mg  1 mg IntraMUSCular PRN    ??? cloNIDine HCl (CATAPRES) tablet 0.1 mg  0.1 mg Oral Q4H PRN   ??? insulin NPH (NOVOLIN N, HUMULIN N) injection 20 Units  20 Units SubCUTAneous QHS            Lab Review:     Recent Labs     07/06/17  0311 07/05/17  1152 07/04/17  0224   WBC 10.6 10.7 9.3   HGB 10.3* 10.8* 11.8   HCT 33.3* 34.9* 38.0   PLT 207 201 168     Recent Labs     07/06/17  0311 07/05/17  1152 07/04/17  0224   NA 138 137 134*   K 3.9 4.2 4.2   CL 103 101 100   CO2 26 26 25    GLU 257* 165* 196*   BUN 17 16 14    CREA 0.55 0.46* 0.58   CA 8.1* 8.5 8.9   MG 2.0 1.9  --    PHOS 2.8 2.5*  --    ALB  --   --  2.3*   TBILI  --   --  1.1*   SGOT  --   --  10*   ALT  --   --  21     Lab Results   Component Value Date/Time    Glucose (POC) 292 (H) 07/06/2017 04:32 PM    Glucose (POC) 202 (H) 07/06/2017 11:15 AM    Glucose (POC) 279 (H) 07/06/2017 06:45 AM    Glucose (POC) 211 (H) 07/05/2017 09:52 PM    Glucose (POC) 180 (H) 07/05/2017 11:15 AM     No results for input(s): PH, PCO2, PO2, HCO3, FIO2 in the last 72 hours.  No results for input(s): INR in the last 72 hours.    No lab exists for component: INREXT    Other pertinent lab: NA         Assessment:     Patient Active Problem List   Diagnosis Code   ??? Hypercholesterolemia E78.00   ??? HTN (hypertension) I10   ??? Diabetes (HCC) E11.9   ??? Sleep apnea G47.30   ??? Hypertension complicating diabetes (HCC) E11.59, I10   ???  Morbid obesity due to excess calories (HCC) E66.01   ??? Hives L50.9   ??? Acute gastric ulcer with perforation (HCC) K25.1   ??? Arthralgia of both knees M25.561, M25.562   ??? Abdominal pain R10.9          Plan:                 1. abd perf- s/p drainage now on tpn  2. Diabetes- nph and ssi  3. Nutrition- on tpn  4. Taper steroids                ___________________________________________________    Attending Physician: Lake Bells, MD

## 2017-07-07 LAB — CBC WITH AUTOMATED DIFF
ABS. BASOPHILS: 0 10*3/uL (ref 0.0–0.1)
ABS. EOSINOPHILS: 0.1 10*3/uL (ref 0.0–0.4)
ABS. IMM. GRANS.: 0.3 10*3/uL — ABNORMAL HIGH (ref 0.00–0.04)
ABS. LYMPHOCYTES: 2 10*3/uL (ref 0.8–3.5)
ABS. MONOCYTES: 0.6 10*3/uL (ref 0.0–1.0)
ABS. NEUTROPHILS: 6.9 10*3/uL (ref 1.8–8.0)
ABSOLUTE NRBC: 0.02 10*3/uL — ABNORMAL HIGH (ref 0.00–0.01)
BASOPHILS: 0 % (ref 0–1)
EOSINOPHILS: 1 % (ref 0–7)
HCT: 33.2 % — ABNORMAL LOW (ref 35.0–47.0)
HGB: 10.1 g/dL — ABNORMAL LOW (ref 11.5–16.0)
IMMATURE GRANULOCYTES: 3 % — ABNORMAL HIGH (ref 0.0–0.5)
LYMPHOCYTES: 20 % (ref 12–49)
MCH: 29.4 PG (ref 26.0–34.0)
MCHC: 30.4 g/dL (ref 30.0–36.5)
MCV: 96.5 FL (ref 80.0–99.0)
MONOCYTES: 6 % (ref 5–13)
MPV: 10 FL (ref 8.9–12.9)
NEUTROPHILS: 70 % (ref 32–75)
NRBC: 0.2 PER 100 WBC — ABNORMAL HIGH
PLATELET: 213 10*3/uL (ref 150–400)
RBC: 3.44 M/uL — ABNORMAL LOW (ref 3.80–5.20)
RDW: 15.4 % — ABNORMAL HIGH (ref 11.5–14.5)
WBC: 9.9 10*3/uL (ref 3.6–11.0)

## 2017-07-07 LAB — GLUCOSE, POC
Glucose (POC): 258 mg/dL — ABNORMAL HIGH (ref 65–100)
Glucose (POC): 348 mg/dL — ABNORMAL HIGH (ref 65–100)
Glucose (POC): 230 mg/dL — ABNORMAL HIGH (ref 65–100)
Glucose (POC): 249 mg/dL — ABNORMAL HIGH (ref 65–100)

## 2017-07-07 LAB — MAGNESIUM: Magnesium: 1.9 mg/dL (ref 1.6–2.4)

## 2017-07-07 LAB — METABOLIC PANEL, BASIC
Anion gap: 7 mmol/L (ref 5–15)
BUN/Creatinine ratio: 30 — ABNORMAL HIGH (ref 12–20)
BUN: 16 MG/DL (ref 6–20)
CO2: 27 mmol/L (ref 21–32)
Calcium: 8.2 MG/DL — ABNORMAL LOW (ref 8.5–10.1)
Chloride: 102 mmol/L (ref 97–108)
Creatinine: 0.54 MG/DL — ABNORMAL LOW (ref 0.55–1.02)
GFR est AA: >60 mL/min/{1.73_m2} (ref >60)
GFR est non-AA: >60 mL/min/{1.73_m2} (ref >60)
Glucose: 301 mg/dL — ABNORMAL HIGH (ref 65–100)
Potassium: 4.1 mmol/L (ref 3.5–5.1)
Sodium: 136 mmol/L (ref 136–145)

## 2017-07-07 LAB — PHOSPHORUS: Phosphorus: 2.2 MG/DL — ABNORMAL LOW (ref 2.6–4.7)

## 2017-07-07 MED ORDER — MVI,ADULT NO.4,VIT K 3300 UNIT-150 MCG/10 ML INTRAVENOUS SOLUTION
3300 unit- 150 mcg/10 mL | INTRAVENOUS | Status: DC
Start: 2017-07-07 — End: 2017-07-07

## 2017-07-07 MED ORDER — POTASSIUM PHOSPHATE DIBASIC 3 MMOLE/ML IV
3 mmol/mL | INTRAVENOUS | Status: DC
Start: 2017-07-07 — End: 2017-07-07

## 2017-07-07 MED ORDER — MAGNESIUM SULFATE 50 % (4 MEQ/ML) INJECTION
4 mEq/mL (50 %) | INTRAMUSCULAR | Status: DC
Start: 2017-07-07 — End: 2017-07-08
  Administered 2017-07-07: 23:00:00 via INTRAVENOUS

## 2017-07-07 MED FILL — NORMAL SALINE FLUSH 0.9 % INJECTION SYRINGE: INTRAMUSCULAR | Qty: 10

## 2017-07-07 MED FILL — NS WITH POTASSIUM CHLORIDE 20 MEQ/L IV: 20 mEq/L | INTRAVENOUS | Qty: 1000

## 2017-07-07 MED FILL — HYDROMORPHONE 2 MG/ML INJECTION SOLUTION: 2 mg/mL | INTRAMUSCULAR | Qty: 1

## 2017-07-07 MED FILL — INSULIN LISPRO 100 UNIT/ML INJECTION: 100 unit/mL | SUBCUTANEOUS | Qty: 1

## 2017-07-07 MED FILL — INSULIN NPH HUMAN RECOMB 100 UNIT/ML INJECTION: 100 unit/mL | SUBCUTANEOUS | Qty: 1

## 2017-07-07 MED FILL — PIPERACILLIN-TAZOBACTAM 3.375 GRAM IV SOLR: 3.375 gram | INTRAVENOUS | Qty: 3.38

## 2017-07-07 MED FILL — AMINOSYN II 10 % IV: 10 % | INTRAVENOUS | Qty: 996

## 2017-07-07 MED FILL — SIMVASTATIN 20 MG TAB: 20 mg | ORAL | Qty: 2

## 2017-07-07 MED FILL — PROTONIX 40 MG INTRAVENOUS SOLUTION: 40 mg | INTRAVENOUS | Qty: 40

## 2017-07-07 MED FILL — SODIUM CHLORIDE 0.9 % INJECTION: INTRAMUSCULAR | Qty: 10

## 2017-07-07 MED FILL — SERTRALINE 50 MG TAB: 50 mg | ORAL | Qty: 2

## 2017-07-07 MED FILL — FLUCONAZOLE IN SALINE (ISO-OSMOTIC) 200 MG/100 ML IV PIGGY BACK: 200 mg/100 mL | INTRAVENOUS | Qty: 100

## 2017-07-07 MED FILL — ENOXAPARIN 40 MG/0.4 ML SUB-Q SYRINGE: 40 mg/0.4 mL | SUBCUTANEOUS | Qty: 0.4

## 2017-07-07 MED FILL — PREDNISONE 10 MG TAB: 10 mg | ORAL | Qty: 1

## 2017-07-07 NOTE — Progress Notes (Signed)
..  Bedside shift change report given to Maralyn SagoSarah, RN (oncoming nurse) by Annamaria HellingAbbey, Darnell, RN (offgoing nurse). Report included the following information SBAR, Kardex, Intake/Output and MAR.

## 2017-07-07 NOTE — Other (Addendum)
DTC Progress Note    Recommendations/ Comments: Chart reviewed on Dana [name] for hyperglycemia. Pt admitted d/t RUQ abdominal pain workup. Pt on steroids PTA, currently on Prednisone 10 mg with breakfast.  BG >180 mg/dl x 2 over the last 24 hours, FBG 301 mg/dl this morning.  Pt remains NPO and TPN was initiated now at 83 ml/hr, receiving 298 grams of dextrose. No insulin on TPN.  Pt has received 18 units of correction insulin in the past 24 hours.    If appropriate, please consider:  Addition of regular insulin to next TPN bag, 12 units/L for total dose of 24 units/day  Changing NPH time to be given with Prednisone     Current hospital DM medication:  NPH, 20 units HS   Lispro, normal scale - 18 units of correction received x 24 hours  Prednisone 10 mg daily with breakfast    Patient is a 61 y.o. female with known history of DM on NPH and Metformin at home.    A1c:   Lab Results   Component Value Date/Time    Hemoglobin A1c 7.3 (H) 05/13/2017 03:04 AM    Hemoglobin A1c 7.2 (H) 01/31/2015 03:52 PM       Recent Glucose Results:   Lab Results   Component Value Date/Time    GLU 301 (H) 07/07/2017 05:01 AM    GLUCPOC 230 (H) 07/07/2017 11:17 AM    GLUCPOC 348 (H) 07/07/2017 06:28 AM    GLUCPOC 258 (H) 07/06/2017 09:49 PM        Lab Results   Component Value Date/Time    Creatinine 0.54 (L) 07/07/2017 05:01 AM     Estimated Creatinine Clearance: 139.5 mL/min (A) (based on SCr of 0.54 mg/dL (L)).    Active Orders   Diet    DIET NPO With Ice Chips        PO intake:   Patient Vitals for the past 72 hrs:   % Diet Eaten   07/06/17 1700 25 %   07/06/17 1132 50 %       Will continue to follow as needed.    Thank you,  Burlene ArntNeysa Serra-Valentin, RD, CDE  [facility]  Pager: [phone]

## 2017-07-07 NOTE — Progress Notes (Signed)
Problem: Falls - Risk of  Goal: *Absence of Falls  Document Schmid Fall Risk and appropriate interventions in the flowsheet.  Outcome: Progressing Towards Goal  Fall Risk Interventions:  Mobility Interventions: Communicate number of staff needed for ambulation/transfer    Medication Interventions: Patient to call before getting OOB    Elimination Interventions: Call light in reach, Patient to call for help with toileting needs             Problem: Pressure Injury - Risk of  Goal: *Prevention of pressure injury  Document Braden Scale and appropriate interventions in the flowsheet.  Outcome: Progressing Towards Goal  Pressure Injury Interventions:  Sensory Interventions: Assess need for specialty bed    Moisture Interventions: Absorbent underpads    Activity Interventions: Pressure redistribution bed/mattress(bed type)    Mobility Interventions: Pressure redistribution bed/mattress (bed type)    Nutrition Interventions: Document food/fluid/supplement intake    Friction and Shear Interventions: Minimize layers

## 2017-07-07 NOTE — Progress Notes (Signed)
Medical Progress Note      NAME: Dana [name]   DOB:  16-Jul-1956  MRM:  [id number]    Date/Time: 07/07/2017  8:05 AM    Problem List:   Principal Problem:    Abdominal pain (07/02/2017)    Active Problems:    HTN (hypertension) ()      Diabetes (HCC) (01/24/2013)      Acute gastric ulcer with perforation (HCC) (05/13/2017)           Subjective:     Patient would like to shower.    Past Medical History:   Diagnosis Date   ??? Diabetes (HCC) 01/24/2013   ??? GERD (gastroesophageal reflux disease)    ??? Hives    ??? HTN (hypertension)    ??? Hypercholesterolemia    ??? Perforated gastric ulcer (HCC)        ROS:  General: negative for fever, chills, sweats, weakness  Respiratory:  negative for cough, sputum production, SOB, wheezing, DOE, pleuritic pain  Cardiology:  negative for chest pain, palpitations, orthopnea, PND, edema, syncope   Gastrointestinal: positive for abd pain         Objective:       Vitals:          Last 24hrs VS reviewed since prior progress note. Most recent are:    Visit Vitals  BP 154/85 (BP 1 Location: Left arm, BP Patient Position: At rest)   Pulse 73   Temp 97.9 ??F (36.6 ??C)   Resp 18   Ht 5\' 4"  (1.626 m)   Wt 249 lb 3.2 oz (113 kg)   SpO2 94%   BMI 42.78 kg/m??     SpO2 Readings from Last 6 Encounters:   07/07/17 94%   06/09/17 98%   05/23/17 98%   05/18/17 93%   05/20/15 94%   11/30/10 100%    O2 Flow Rate (L/min): 3 l/min       Intake/Output Summary (Last 24 hours) at 07/07/2017 0805  Last data filed at 07/06/2017 2026  Gross per 24 hour   Intake 560 ml   Output 140 ml   Net 420 ml          Exam:     General   Obese wf  Respiratory   Clear To Auscultation bilaterally - no wheezes, rales, rhonchi, or crackles  Cardiology  regular  Abdominal  Generalized tenderness  Extremities  No clubbing, cyanosis, or edema. Pulses intact.    Lab Data Reviewed: (see below)    Medications Reviewed: (see below)    ______________________________________________________________________    Medications:      Current Facility-Administered Medications   Medication Dose Route Frequency   ??? TPN ADULT - CENTRAL   IntraVENous CONTINUOUS   ??? predniSONE (DELTASONE) tablet 10 mg  10 mg Oral DAILY WITH BREAKFAST   ??? sodium chloride (NS) flush 20 mL  20 mL InterCATHeter PRN   ??? sodium chloride (NS) flush 10 mL  10 mL InterCATHeter Q24H   ??? sodium chloride (NS) flush 10 mL  10 mL InterCATHeter PRN   ??? sodium chloride (NS) flush 10 mL  10 mL InterCATHeter Q8H   ??? alteplase (CATHFLO) 1 mg in sterile water (preservative free) 1 mL injection  1 mg InterCATHeter PRN   ??? bacitracin 500 unit/gram packet 1 Packet  1 Packet Topical PRN   ??? fat emulsion 20% (LIPOSYN, INTRAlipid) infusion 500 mL  500 mL IntraVENous Q MON, WED & FRI   ??? HYDROmorphone (DILAUDID) injection  2 mg  2 mg IntraVENous Q3H PRN   ??? enoxaparin (LOVENOX) injection 40 mg  40 mg SubCUTAneous DAILY   ??? fluconazole (DIFLUCAN) 200mg /100 mL IVPB (premix)  200 mg IntraVENous Q24H   ??? 0.9% sodium chloride with KCl 20 mEq/L infusion   IntraVENous CONTINUOUS   ??? pantoprazole (PROTONIX) 40 mg in sodium chloride 0.9% 10 mL injection  40 mg IntraVENous Q12H   ??? piperacillin-tazobactam (ZOSYN) 3.375 g in 0.9% sodium chloride (MBP/ADV) 100 mL  3.375 g IntraVENous Q8H   ??? simvastatin (ZOCOR) tablet 40 mg  40 mg Oral QHS   ??? sertraline (ZOLOFT) tablet 100 mg  100 mg Oral DAILY   ??? sodium chloride (NS) flush 5-10 mL  5-10 mL IntraVENous Q8H   ??? sodium chloride (NS) flush 5-10 mL  5-10 mL IntraVENous PRN   ??? acetaminophen (TYLENOL) tablet 650 mg  650 mg Oral Q4H PRN   ??? ondansetron (ZOFRAN) injection 4 mg  4 mg IntraVENous Q4H PRN   ??? hydrALAZINE (APRESOLINE) 20 mg/mL injection 20 mg  20 mg IntraVENous Q6H PRN   ??? insulin lispro (HUMALOG) injection   SubCUTAneous AC&HS   ??? glucose chewable tablet 16 g  4 Tab Oral PRN   ??? dextrose (D50W) injection syrg 12.5-25 g  12.5-25 g IntraVENous PRN   ??? glucagon (GLUCAGEN) injection 1 mg  1 mg IntraMUSCular PRN    ??? cloNIDine HCl (CATAPRES) tablet 0.1 mg  0.1 mg Oral Q4H PRN   ??? insulin NPH (NOVOLIN N, HUMULIN N) injection 20 Units  20 Units SubCUTAneous QHS            Lab Review:     Recent Labs     07/07/17  0501 07/06/17  0311 07/05/17  1152   WBC 9.9 10.6 10.7   HGB 10.1* 10.3* 10.8*   HCT 33.2* 33.3* 34.9*   PLT 213 207 201     Recent Labs     07/07/17  0501 07/06/17  0311 07/05/17  1152   NA 136 138 137   K 4.1 3.9 4.2   CL 102 103 101   CO2 27 26 26    GLU 301* 257* 165*   BUN 16 17 16    CREA 0.54* 0.55 0.46*   CA 8.2* 8.1* 8.5   MG 1.9 2.0 1.9   PHOS 2.2* 2.8 2.5*     Lab Results   Component Value Date/Time    Glucose (POC) 348 (H) 07/07/2017 06:28 AM    Glucose (POC) 258 (H) 07/06/2017 09:49 PM    Glucose (POC) 292 (H) 07/06/2017 04:32 PM    Glucose (POC) 202 (H) 07/06/2017 11:15 AM    Glucose (POC) 279 (H) 07/06/2017 06:45 AM     No results for input(s): PH, PCO2, PO2, HCO3, FIO2 in the last 72 hours.  No results for input(s): INR in the last 72 hours.    No lab exists for component: INREXT    Other pertinent lab: NA         Assessment:     Patient Active Problem List   Diagnosis Code   ??? Hypercholesterolemia E78.00   ??? HTN (hypertension) I10   ??? Diabetes (HCC) E11.9   ??? Sleep apnea G47.30   ??? Hypertension complicating diabetes (HCC) E11.59, I10   ??? Morbid obesity due to excess calories (HCC) E66.01   ??? Hives L50.9   ??? Acute gastric ulcer with perforation (HCC) K25.1   ??? Arthralgia of both knees M25.561, M25.562   ???  Abdominal pain R10.9          Plan:                 1. Bowel perf- s/p drainage pain controlled  2. Diabetes- should improve off steroids  3. Nutrition- tpn, add insulin to bag                ___________________________________________________    Attending Physician: Lake BellsNancy J Devlin Brink, MD

## 2017-07-07 NOTE — Progress Notes (Signed)
Progress Note    Patient: Dana [name] MRN: [id number]  SSN: [id number]    Date of Birth: 1956/09/03  Age: 61 y.o.  Sex: female      Admit Date: 07/02/2017    * No surgery found *    Procedure:      Subjective:     Patient overall feeling better but having some lower abdominal pain.      Objective:     Visit Vitals  BP 154/85 (BP 1 Location: Left arm, BP Patient Position: At rest)   Pulse 73   Temp 97.9 ??F (36.6 ??C)   Resp 18   Ht 5\' 4"  (1.626 m)   Wt 253 lb 3.2 oz (114.9 kg)   SpO2 94%   BMI 43.46 kg/m??       Temp (24hrs), Avg:98.5 ??F (36.9 ??C), Min:97.9 ??F (36.6 ??C), Max:98.8 ??F (37.1 ??C)      Physical Exam:    Gen- alert in NAD  Lungs- CTA  H-RRR  Abd- s/mild tenderness RUQ no rebound . Mild tenderness bilateral lower quadrants  Drain purulent  Data Review: images and reports reviewed    Lab Review:   All lab results for the last 24 hours reviewed.  Recent Results (from the past 24 hour(s))   GLUCOSE, POC    Collection Time: 07/06/17  4:32 PM   Result Value Ref Range    Glucose (POC) 292 (H) 65 - 100 mg/dL    Performed by Sharmon RevereIENG SAMANTHA    GLUCOSE, POC    Collection Time: 07/06/17  9:49 PM   Result Value Ref Range    Glucose (POC) 258 (H) 65 - 100 mg/dL    Performed by [place]    CBC WITH AUTOMATED DIFF    Collection Time: 07/07/17  5:01 AM   Result Value Ref Range    WBC 9.9 3.6 - 11.0 K/uL    RBC 3.44 (L) 3.80 - 5.20 M/uL    HGB 10.1 (L) 11.5 - 16.0 g/dL    HCT 78.233.2 (L) 95.635.0 - 47.0 %    MCV 96.5 80.0 - 99.0 FL    MCH 29.4 26.0 - 34.0 PG    MCHC 30.4 30.0 - 36.5 g/dL    RDW 21.315.4 (H) 08.611.5 - 14.5 %    PLATELET 213 150 - 400 K/uL    MPV 10.0 8.9 - 12.9 FL    NRBC 0.2 (H) 0 PER 100 WBC    ABSOLUTE NRBC 0.02 (H) 0.00 - 0.01 K/uL    NEUTROPHILS 70 32 - 75 %    LYMPHOCYTES 20 12 - 49 %    MONOCYTES 6 5 - 13 %    EOSINOPHILS 1 0 - 7 %    BASOPHILS 0 0 - 1 %    IMMATURE GRANULOCYTES 3 (H) 0.0 - 0.5 %    ABS. NEUTROPHILS 6.9 1.8 - 8.0 K/UL    ABS. LYMPHOCYTES 2.0 0.8 - 3.5 K/UL     ABS. MONOCYTES 0.6 0.0 - 1.0 K/UL    ABS. EOSINOPHILS 0.1 0.0 - 0.4 K/UL    ABS. BASOPHILS 0.0 0.0 - 0.1 K/UL    ABS. IMM. GRANS. 0.3 (H) 0.00 - 0.04 K/UL    DF SMEAR SCANNED      RBC COMMENTS ANISOCYTOSIS  1+        RBC COMMENTS MACROCYTOSIS  1+       METABOLIC PANEL, BASIC    Collection Time: 07/07/17  5:01 AM   Result  Value Ref Range    Sodium 136 136 - 145 mmol/L    Potassium 4.1 3.5 - 5.1 mmol/L    Chloride 102 97 - 108 mmol/L    CO2 27 21 - 32 mmol/L    Anion gap 7 5 - 15 mmol/L    Glucose 301 (H) 65 - 100 mg/dL    BUN 16 6 - 20 MG/DL    Creatinine 1.61 (L) 0.55 - 1.02 MG/DL    BUN/Creatinine ratio 30 (H) 12 - 20      GFR est AA >60 >60 ml/min/1.36m2    GFR est non-AA >60 >60 ml/min/1.19m2    Calcium 8.2 (L) 8.5 - 10.1 MG/DL   MAGNESIUM    Collection Time: 07/07/17  5:01 AM   Result Value Ref Range    Magnesium 1.9 1.6 - 2.4 mg/dL   PHOSPHORUS    Collection Time: 07/07/17  5:01 AM   Result Value Ref Range    Phosphorus 2.2 (L) 2.6 - 4.7 MG/DL   GLUCOSE, POC    Collection Time: 07/07/17  6:28 AM   Result Value Ref Range    Glucose (POC) 348 (H) 65 - 100 mg/dL    Performed by Cherlyn Labella    GLUCOSE, POC    Collection Time: 07/07/17 11:17 AM   Result Value Ref Range    Glucose (POC) 230 (H) 65 - 100 mg/dL    Performed by Toni Amend    \\  Assessment:     Hospital Problems  Date Reviewed: Jul 17, 2017          Codes Class Noted POA    * (Principal) Abdominal pain ICD-10-CM: R10.9  ICD-9-CM: 789.00  07/02/2017 Yes        Acute gastric ulcer with perforation (HCC) ICD-10-CM: K25.1  ICD-9-CM: 531.10  05/13/2017 Yes        Diabetes (HCC) ICD-10-CM: E11.9  ICD-9-CM: 250.00  01/24/2013 Yes        HTN (hypertension) ICD-10-CM: I10  ICD-9-CM: 401.9  Unknown Yes              Plan/Recommendations/Medical Decision Making:   She is being weaned off of the Steroids. I think it will take this move for her to ultimately heal  TPN renewed  Continue ABX  Repeat CT tomorrow to assess status of original drainage and  Look for any  other collections    Signed By: Hoy Finlay, MD     July 07, 2017

## 2017-07-08 ENCOUNTER — Inpatient Hospital Stay: Admit: 2017-07-08 | Payer: BLUE CROSS/BLUE SHIELD | Primary: Internal Medicine

## 2017-07-08 LAB — PHOSPHORUS: Phosphorus: 3.4 MG/DL (ref 2.6–4.7)

## 2017-07-08 LAB — CBC WITH AUTOMATED DIFF
ABS. BASOPHILS: 0.1 10*3/uL (ref 0.0–0.1)
ABS. EOSINOPHILS: 0 10*3/uL (ref 0.0–0.4)
ABS. IMM. GRANS.: 0 10*3/uL
ABS. LYMPHOCYTES: 2.2 10*3/uL (ref 0.8–3.5)
ABS. MONOCYTES: 0.7 10*3/uL (ref 0.0–1.0)
ABS. NEUTROPHILS: 6.9 10*3/uL (ref 1.8–8.0)
ABSOLUTE NRBC: 0.05 10*3/uL — ABNORMAL HIGH (ref 0.00–0.01)
BAND NEUTROPHILS: 2 % (ref 0–6)
BASOPHILS: 1 % (ref 0–1)
EOSINOPHILS: 0 % (ref 0–7)
HCT: 33.5 % — ABNORMAL LOW (ref 35.0–47.0)
HGB: 10.3 g/dL — ABNORMAL LOW (ref 11.5–16.0)
IMMATURE GRANULOCYTES: 0 %
LYMPHOCYTES: 21 % (ref 12–49)
MCH: 29.3 PG (ref 26.0–34.0)
MCHC: 30.7 g/dL (ref 30.0–36.5)
MCV: 95.4 FL (ref 80.0–99.0)
METAMYELOCYTES: 1 % — ABNORMAL HIGH
MONOCYTES: 7 % (ref 5–13)
MPV: 10 FL (ref 8.9–12.9)
MYELOCYTES: 4 % — ABNORMAL HIGH
NEUTROPHILS: 64 % (ref 32–75)
NRBC: 0.5 PER 100 WBC — ABNORMAL HIGH
PLATELET: 248 10*3/uL (ref 150–400)
RBC: 3.51 M/uL — ABNORMAL LOW (ref 3.80–5.20)
RDW: 15.3 % — ABNORMAL HIGH (ref 11.5–14.5)
WBC: 10.5 10*3/uL (ref 3.6–11.0)

## 2017-07-08 LAB — CULTURE, BODY FLUID W GRAM STAIN

## 2017-07-08 LAB — CULTURE, ANAEROBIC

## 2017-07-08 LAB — GLUCOSE, POC
Glucose (POC): 229 mg/dL — ABNORMAL HIGH (ref 65–100)
Glucose (POC): 269 mg/dL — ABNORMAL HIGH (ref 65–100)
Glucose (POC): 248 mg/dL — ABNORMAL HIGH (ref 65–100)
Glucose (POC): 126 mg/dL — ABNORMAL HIGH (ref 65–100)

## 2017-07-08 LAB — METABOLIC PANEL, BASIC
Anion gap: 6 mmol/L (ref 5–15)
BUN/Creatinine ratio: 30 — ABNORMAL HIGH (ref 12–20)
BUN: 13 MG/DL (ref 6–20)
CO2: 25 mmol/L (ref 21–32)
Calcium: 8.5 MG/DL (ref 8.5–10.1)
Chloride: 104 mmol/L (ref 97–108)
Creatinine: 0.44 MG/DL — ABNORMAL LOW (ref 0.55–1.02)
GFR est AA: >60 mL/min/{1.73_m2} (ref >60)
GFR est non-AA: >60 mL/min/{1.73_m2} (ref >60)
Glucose: 188 mg/dL — ABNORMAL HIGH (ref 65–100)
Potassium: 4.3 mmol/L (ref 3.5–5.1)
Sodium: 135 mmol/L — ABNORMAL LOW (ref 136–145)

## 2017-07-08 LAB — MAGNESIUM: Magnesium: 1.9 mg/dL (ref 1.6–2.4)

## 2017-07-08 MED ORDER — IOPAMIDOL 76 % IV SOLN
370 mg iodine /mL (76 %) | Freq: Once | INTRAVENOUS | Status: AC
Start: 2017-07-08 — End: 2017-07-08
  Administered 2017-07-08: 17:00:00 via INTRAVENOUS

## 2017-07-08 MED ORDER — SODIUM PHOSPHATE 3 MMOLE/ML IV
3 mmol/mL | INTRAVENOUS | Status: AC
Start: 2017-07-08 — End: 2017-07-09
  Administered 2017-07-08: 23:00:00 via INTRAVENOUS

## 2017-07-08 MED ORDER — SODIUM CHLORIDE 0.9 % IJ SYRG
Freq: Once | INTRAMUSCULAR | Status: AC
Start: 2017-07-08 — End: 2017-07-08
  Administered 2017-07-08: 17:00:00 via INTRAVENOUS

## 2017-07-08 MED ORDER — DEXTROSE 70% IN WATER (D70W) IV
3 mmol/mL | INTRAVENOUS | Status: DC
Start: 2017-07-08 — End: 2017-07-08

## 2017-07-08 MED ORDER — SODIUM CHLORIDE 0.9% BOLUS IV
0.9 % | Freq: Once | INTRAVENOUS | Status: AC
Start: 2017-07-08 — End: 2017-07-08
  Administered 2017-07-08: 17:00:00 via INTRAVENOUS

## 2017-07-08 MED ORDER — SODIUM PHOSPHATE 3 MMOLE/ML IV
3 mmol/mL | INTRAVENOUS | Status: DC
Start: 2017-07-08 — End: 2017-07-08

## 2017-07-08 MED FILL — PIPERACILLIN-TAZOBACTAM 3.375 GRAM IV SOLR: 3.375 gram | INTRAVENOUS | Qty: 3.38

## 2017-07-08 MED FILL — ENOXAPARIN 40 MG/0.4 ML SUB-Q SYRINGE: 40 mg/0.4 mL | SUBCUTANEOUS | Qty: 0.4

## 2017-07-08 MED FILL — HYDROMORPHONE 2 MG/ML INJECTION SOLUTION: 2 mg/mL | INTRAMUSCULAR | Qty: 1

## 2017-07-08 MED FILL — NS WITH POTASSIUM CHLORIDE 20 MEQ/L IV: 20 mEq/L | INTRAVENOUS | Qty: 1000

## 2017-07-08 MED FILL — NORMAL SALINE FLUSH 0.9 % INJECTION SYRINGE: INTRAMUSCULAR | Qty: 10

## 2017-07-08 MED FILL — SODIUM CHLORIDE 0.9 % IV: INTRAVENOUS | Qty: 100

## 2017-07-08 MED FILL — INSULIN LISPRO 100 UNIT/ML INJECTION: 100 unit/mL | SUBCUTANEOUS | Qty: 1

## 2017-07-08 MED FILL — AMINOSYN II 10 % IV: 10 % | INTRAVENOUS | Qty: 996

## 2017-07-08 MED FILL — PROTONIX 40 MG INTRAVENOUS SOLUTION: 40 mg | INTRAVENOUS | Qty: 40

## 2017-07-08 MED FILL — INSULIN NPH HUMAN RECOMB 100 UNIT/ML INJECTION: 100 unit/mL | SUBCUTANEOUS | Qty: 1

## 2017-07-08 MED FILL — SIMVASTATIN 20 MG TAB: 20 mg | ORAL | Qty: 2

## 2017-07-08 MED FILL — ISOVUE-370  76 % INTRAVENOUS SOLUTION: 370 mg iodine /mL (76 %) | INTRAVENOUS | Qty: 100

## 2017-07-08 MED FILL — AMINOSYN II 10 % IV: 10 % | INTRAVENOUS | Qty: 1000

## 2017-07-08 MED FILL — INTRALIPID 20 % INTRAVENOUS EMULSION: 20 % | INTRAVENOUS | Qty: 500

## 2017-07-08 MED FILL — SERTRALINE 50 MG TAB: 50 mg | ORAL | Qty: 2

## 2017-07-08 NOTE — Progress Notes (Signed)
NUTRITION COMPLETE ASSESSMENT    RECOMMENDATIONS:   1. Recommend to add 20% 500 ml IV lipids x 3 week and  run over 12 hours.     2.  Cyclic recommendations for 12 hours ( 8pm-8am):   90 ml/hr x first hour   182 ml/hr x 10 hours    90 ml/hr x 10 hours     3. Check BG 2 hours into infusion and 1 hour after discontinuation while TPN running.   4. Continue to monitor labs and adjust insulin in TPN as needed.        Interventions/Plan:   Food/Nutrient Delivery:          Modify rate, concentration, composition, and schedule, Initiate parenteral nutrition      Assessment:   Reason for Assessment:   [x]  Provider Consult    Diet: NPO   TPN: AA 5% D15 @ 2 liters    Supplements: none  Nutritionally Significant Medications: [x]  Reviewed  Meal Intake:   Patient Vitals for the past 100 hrs:   % Diet Eaten   07/06/17 1700 25 %   07/06/17 1132 50 %     Objective:  Chart reviewed and consult received for cyclic TPN. BG 269, 248 - insulin added to TPN.   Recommend to add 20% 500 ml IV lipids x 3 week to increase total calories and provide EFA, run over 12 hours.     Cyclic recommendations for 12 hours ( 8pm-8am)  90 ml/hr x first hour  182 ml/hr x 10 hours   90 ml/hr x 10 hours     Check BG 2 hours into infusion and 1 hour after discontinuation while TPN running.     Estimated Nutrition Needs:   Kcals/day: 2050 Kcals/day  Protein:   109 gm (~2 gm/kg IBW)  Fluid: 2000 ml(~ 74m/kcal)   Based On: [place] Jeor(MSJ x 1.2)  Weight Used: Actual wt(108.1 kg)    Nutrition Diagnosis:   1. Altered GI function related to leak s/p previous ulcer repair as evidenced by NPO, start TPN; surgical repair perforated ulcer 05/2017      Goals:     Meet 90% nutritional needs via TPN x 5-7 days.      Monitoring & Evaluation:    - Total energy intake, Protein intake, IV fluids   - Lean body mass, fat free mass, GI, Protein profile, Weight/weight change     Previous Nutrition Goals Met:  Progressing  Previous Recommendations:      Yes     Education & Discharge Needs:   [x]  None Identified   []  Identified and addressed    [x]  Participated in care plan, discharge planning, and/or interdisciplinary rounds        Cultural, religious and ethnic food preferences identified:   None    Skin Integrity: [x] Intact  [] Other  Edema: [x] None [] Other  Food Allergies: [x] None [] Other    Anthropometrics:    Weight Loss Metrics 07/08/2017 07/01/2017 06/09/2017 05/24/2017 05/23/2017 05/16/2017 03/17/2017   Today's Wt 257 lb 4.4 oz 232 lb 236 lb 232 lb 235 lb 254 lb 3.1 oz 250 lb   BMI 44.16 kg/m2 39.82 kg/m2 40.51 kg/m2 39.82 kg/m2 36.81 kg/m2 39.81 kg/m2 43.59 kg/m2      Last 3 Recorded Weights in this Encounter    07/06/17 2243 07/07/17 0925 07/08/17 1041   Weight: 113 kg (249 lb 3.2 oz) 114.9 kg (253 lb 3.2 oz) 116.7 kg (257 lb 4.4 oz)      Weight  Source: Standing scale (comment)  Height: 5' 4"  (162.6 cm),    Body mass index is 44.16 kg/m??.  IBW : 54.4 kg (120 lb),    Usual Body Weight: 113.4 kg (250 lb),      Labs:    Lab Results   Component Value Date/Time    Sodium 135 (L) 07/08/2017 04:04 AM    Potassium 4.3 07/08/2017 04:04 AM    Chloride 104 07/08/2017 04:04 AM    CO2 25 07/08/2017 04:04 AM    Glucose 188 (H) 07/08/2017 04:04 AM    BUN 13 07/08/2017 04:04 AM    Creatinine 0.44 (L) 07/08/2017 04:04 AM    Calcium 8.5 07/08/2017 04:04 AM    Magnesium 1.9 07/08/2017 04:04 AM    Phosphorus 3.4 07/08/2017 04:04 AM    Albumin 2.3 (L) 07/04/2017 02:24 AM     Lab Results   Component Value Date/Time    Hemoglobin A1c 7.3 (H) 05/13/2017 03:04 AM    Hemoglobin A1c (POC) 8.4 (A) 07/01/2017 02:50 PM     Lab Results   Component Value Date/Time    Glucose 188 (H) 07/08/2017 04:04 AM    Glucose (POC) 248 (H) 07/08/2017 11:12 AM      Lab Results   Component Value Date/Time    ALT (SGPT) 21 07/04/2017 02:24 AM    AST (SGOT) 10 (L) 07/04/2017 02:24 AM    Alk. phosphatase 51 07/04/2017 02:24 AM    Bilirubin, total 1.1 (H) 07/04/2017 02:24 AM        Milas Hock, RD

## 2017-07-08 NOTE — Progress Notes (Signed)
Medical Progress Note      NAME: Dana [name]   DOB:  07-Dec-1955  MRM:  [id number]    Date/Time: 07/08/2017  8:21 AM    Problem List:   Principal Problem:    Abdominal pain (07/02/2017)    Active Problems:    HTN (hypertension) ()      Diabetes (HCC) (01/24/2013)      Acute gastric ulcer with perforation (HCC) (05/13/2017)           Subjective:     Patient still having pain, especially around drain.      Past Medical History:   Diagnosis Date   ??? Diabetes (HCC) 01/24/2013   ??? GERD (gastroesophageal reflux disease)    ??? Hives    ??? HTN (hypertension)    ??? Hypercholesterolemia    ??? Perforated gastric ulcer (HCC)        ROS:  General: negative for fever, chills, sweats, weakness  Respiratory:  negative for cough, sputum production, SOB, wheezing, DOE, pleuritic pain  Cardiology:  negative for chest pain, palpitations, orthopnea, PND, edema, syncope   Gastrointestinal: positive for abd pain         Objective:       Vitals:          Last 24hrs VS reviewed since prior progress note. Most recent are:    Visit Vitals  BP 130/81 (BP 1 Location: Left arm, BP Patient Position: At rest)   Pulse 70   Temp 98.7 ??F (37.1 ??C)   Resp 18   Ht 5\' 4"  (1.626 m)   Wt 253 lb 3.2 oz (114.9 kg)   SpO2 93%   BMI 43.46 kg/m??     SpO2 Readings from Last 6 Encounters:   07/08/17 93%   06/09/17 98%   05/23/17 98%   05/18/17 93%   05/20/15 94%   11/30/10 100%    O2 Flow Rate (L/min): 3 l/min       Intake/Output Summary (Last 24 hours) at 07/08/2017 4098  Last data filed at 07/07/2017 1901  Gross per 24 hour   Intake ???   Output 50 ml   Net -50 ml          Exam:     General   Obese wf  Respiratory   Clear To Auscultation bilaterally - no wheezes, rales, rhonchi, or crackles  Cardiology  regular  Abdominal  Obese, tender around drain and in llq  Extremities  No edema, good pulses    Lab Data Reviewed: (see below)    Medications Reviewed: (see below)    ______________________________________________________________________    Medications:      Current Facility-Administered Medications   Medication Dose Route Frequency   ??? sodium chloride 0.9 % bolus infusion 100 mL  100 mL IntraVENous RAD ONCE   ??? iopamidol (ISOVUE-370) 76 % injection 100 mL  100 mL IntraVENous RAD ONCE   ??? sodium chloride (NS) flush 10 mL  10 mL IntraVENous RAD ONCE   ??? TPN ADULT - CENTRAL   IntraVENous CONTINUOUS   ??? sodium chloride (NS) flush 20 mL  20 mL InterCATHeter PRN   ??? sodium chloride (NS) flush 10 mL  10 mL InterCATHeter Q24H   ??? sodium chloride (NS) flush 10 mL  10 mL InterCATHeter PRN   ??? sodium chloride (NS) flush 10 mL  10 mL InterCATHeter Q8H   ??? alteplase (CATHFLO) 1 mg in sterile water (preservative free) 1 mL injection  1 mg InterCATHeter PRN   ???  bacitracin 500 unit/gram packet 1 Packet  1 Packet Topical PRN   ??? fat emulsion 20% (LIPOSYN, INTRAlipid) infusion 500 mL  500 mL IntraVENous Q MON, WED & FRI   ??? HYDROmorphone (DILAUDID) injection 2 mg  2 mg IntraVENous Q3H PRN   ??? enoxaparin (LOVENOX) injection 40 mg  40 mg SubCUTAneous DAILY   ??? fluconazole (DIFLUCAN) 200mg /100 mL IVPB (premix)  200 mg IntraVENous Q24H   ??? 0.9% sodium chloride with KCl 20 mEq/L infusion   IntraVENous CONTINUOUS   ??? pantoprazole (PROTONIX) 40 mg in sodium chloride 0.9% 10 mL injection  40 mg IntraVENous Q12H   ??? piperacillin-tazobactam (ZOSYN) 3.375 g in 0.9% sodium chloride (MBP/ADV) 100 mL  3.375 g IntraVENous Q8H   ??? simvastatin (ZOCOR) tablet 40 mg  40 mg Oral QHS   ??? sertraline (ZOLOFT) tablet 100 mg  100 mg Oral DAILY   ??? sodium chloride (NS) flush 5-10 mL  5-10 mL IntraVENous Q8H   ??? sodium chloride (NS) flush 5-10 mL  5-10 mL IntraVENous PRN   ??? acetaminophen (TYLENOL) tablet 650 mg  650 mg Oral Q4H PRN   ??? ondansetron (ZOFRAN) injection 4 mg  4 mg IntraVENous Q4H PRN   ??? hydrALAZINE (APRESOLINE) 20 mg/mL injection 20 mg  20 mg IntraVENous Q6H PRN   ??? insulin lispro (HUMALOG) injection   SubCUTAneous AC&HS   ??? glucose chewable tablet 16 g  4 Tab Oral PRN    ??? dextrose (D50W) injection syrg 12.5-25 g  12.5-25 g IntraVENous PRN   ??? glucagon (GLUCAGEN) injection 1 mg  1 mg IntraMUSCular PRN   ??? cloNIDine HCl (CATAPRES) tablet 0.1 mg  0.1 mg Oral Q4H PRN   ??? insulin NPH (NOVOLIN N, HUMULIN N) injection 20 Units  20 Units SubCUTAneous QHS            Lab Review:     Recent Labs     07/08/17  0404 07/07/17  0501 07/06/17  0311   WBC 10.5 9.9 10.6   HGB 10.3* 10.1* 10.3*   HCT 33.5* 33.2* 33.3*   PLT 248 213 207     Recent Labs     07/08/17  0404 07/07/17  0501 07/06/17  0311   NA 135* 136 138   K 4.3 4.1 3.9   CL 104 102 103   CO2 25 27 26    GLU 188* 301* 257*   BUN 13 16 17    CREA 0.44* 0.54* 0.55   CA 8.5 8.2* 8.1*   MG 1.9 1.9 2.0   PHOS 3.4 2.2* 2.8     Lab Results   Component Value Date/Time    Glucose (POC) 269 (H) 07/08/2017 07:01 AM    Glucose (POC) 229 (H) 07/07/2017 08:56 PM    Glucose (POC) 249 (H) 07/07/2017 04:28 PM    Glucose (POC) 230 (H) 07/07/2017 11:17 AM    Glucose (POC) 348 (H) 07/07/2017 06:28 AM     No results for input(s): PH, PCO2, PO2, HCO3, FIO2 in the last 72 hours.  No results for input(s): INR in the last 72 hours.    No lab exists for component: INREXT    Other pertinent lab: NA         Assessment:     Patient Active Problem List   Diagnosis Code   ??? Hypercholesterolemia E78.00   ??? HTN (hypertension) I10   ??? Diabetes (HCC) E11.9   ??? Sleep apnea G47.30   ??? Hypertension complicating diabetes (HCC) E11.59,  I10   ??? Morbid obesity due to excess calories (HCC) E66.01   ??? Hives L50.9   ??? Acute gastric ulcer with perforation (HCC) K25.1   ??? Arthralgia of both knees M25.561, M25.562   ??? Abdominal pain R10.9          Plan:                 1. Bowel perf- s/p drainage- repeat ct today  2. Diabetes- now off steroids, continue insulin  3. Hypertension- continue meds  4. Nutrition- tpn, add insulin to bag                ___________________________________________________    Attending Physician: Lake Bells, MD

## 2017-07-08 NOTE — Other (Signed)
DTC Progress Note    Recommendations/ Comments: Chart reviewed on Dana [name] for hyperglycemia, most BG's > 200 mg/dL  Pt admitted d/t RUQ abdominal pain workup.   Pt was on steroids -these have been discontinued.  Pt remains NPO and on TPN with 8 units/L, total 16 units in current bag and in the next bag.   Pt received 15 units of correction insulin in the past 24 hours.    If appropriate, please consider:  Observe BG response now that off of steroids.  Titrate either NPH or insulin in TPN to achieve BG's < 180 mg/dL      Current hospital DM medication:  NPH, 20 units HS   Lispro, normal scale - 15 units of correction received x 24 hours  TPN with insuiln 8 units/L, total 16    Patient is a 61 y.o. female with known history of DM on NPH and Metformin at home.    A1c:   Lab Results   Component Value Date/Time    Hemoglobin A1c 7.3 (H) 05/13/2017 03:04 AM    Hemoglobin A1c 7.2 (H) 01/31/2015 03:52 PM       Recent Glucose Results:   Lab Results   Component Value Date/Time    GLU 188 (H) 07/08/2017 04:04 AM    GLUCPOC 248 (H) 07/08/2017 11:12 AM    GLUCPOC 269 (H) 07/08/2017 07:01 AM    GLUCPOC 229 (H) 07/07/2017 08:56 PM        Lab Results   Component Value Date/Time    Creatinine 0.44 (L) 07/08/2017 04:04 AM     Estimated Creatinine Clearance: 172.8 mL/min (A) (based on SCr of 0.44 mg/dL (L)).    Active Orders   Diet    DIET NPO With Ice Chips        PO intake:   Patient Vitals for the past 72 hrs:   % Diet Eaten   07/06/17 1700 25 %   07/06/17 1132 50 %       Will continue to follow as needed.    Thank you,  Higinio PlanSallie P. Cristal Qadir RN, CDE  [facility]  Pager: [phone]

## 2017-07-08 NOTE — Progress Notes (Signed)
Dana [name] reports abdominal pain today.  Tm 98.8 HR: 76 BP: 162/86 Resp Rate: 18 93% sat on room air.    Intake/Output Summary (Last 24 hours) at 07/08/2017 0952  Last data filed at 07/07/2017 1901  Gross per 24 hour   Intake ???   Output 50 ml   Net -50 ml   Exam: Cor: RRR.              Lungs: Bilateral breath sounds. Clear to auscultation.              Abd: Soft. Tender in RUQ.             Non distended.             Purulent appearing material in drain.  Labs:   Recent Results (from the past 12 hour(s))   CBC WITH AUTOMATED DIFF    Collection Time: 07/08/17  4:04 AM   Result Value Ref Range    WBC 10.5 3.6 - 11.0 K/uL    RBC 3.51 (L) 3.80 - 5.20 M/uL    HGB 10.3 (L) 11.5 - 16.0 g/dL    HCT 16.133.5 (L) 09.635.0 - 47.0 %    MCV 95.4 80.0 - 99.0 FL    MCH 29.3 26.0 - 34.0 PG    MCHC 30.7 30.0 - 36.5 g/dL    RDW 04.515.3 (H) 40.911.5 - 14.5 %    PLATELET 248 150 - 400 K/uL    MPV 10.0 8.9 - 12.9 FL    NRBC 0.5 (H) 0 PER 100 WBC    ABSOLUTE NRBC 0.05 (H) 0.00 - 0.01 K/uL    NEUTROPHILS 64 32 - 75 %    BAND NEUTROPHILS 2 0 - 6 %    LYMPHOCYTES 21 12 - 49 %    MONOCYTES 7 5 - 13 %    EOSINOPHILS 0 0 - 7 %    BASOPHILS 1 0 - 1 %    METAMYELOCYTES 1 (H) 0 %    MYELOCYTES 4 (H) 0 %    IMMATURE GRANULOCYTES 0 %    ABS. NEUTROPHILS 6.9 1.8 - 8.0 K/UL    ABS. LYMPHOCYTES 2.2 0.8 - 3.5 K/UL    ABS. MONOCYTES 0.7 0.0 - 1.0 K/UL    ABS. EOSINOPHILS 0.0 0.0 - 0.4 K/UL    ABS. BASOPHILS 0.1 0.0 - 0.1 K/UL    ABS. IMM. GRANS. 0.0 K/UL    DF MANUAL      RBC COMMENTS ANISOCYTOSIS  1+        RBC COMMENTS MACROCYTOSIS  1+       METABOLIC PANEL, BASIC    Collection Time: 07/08/17  4:04 AM   Result Value Ref Range    Sodium 135 (L) 136 - 145 mmol/L    Potassium 4.3 3.5 - 5.1 mmol/L    Chloride 104 97 - 108 mmol/L    CO2 25 21 - 32 mmol/L    Anion gap 6 5 - 15 mmol/L    Glucose 188 (H) 65 - 100 mg/dL    BUN 13 6 - 20 MG/DL    Creatinine 8.110.44 (L) 0.55 - 1.02 MG/DL    BUN/Creatinine ratio 30 (H) 12 - 20      GFR est AA >60 >60 ml/min/1.7173m2     GFR est non-AA >60 >60 ml/min/1.5173m2    Calcium 8.5 8.5 - 10.1 MG/DL   MAGNESIUM    Collection Time: 07/08/17  4:04 AM   Result Value Ref Range  Magnesium 1.9 1.6 - 2.4 mg/dL   PHOSPHORUS    Collection Time: 07/08/17  4:04 AM   Result Value Ref Range    Phosphorus 3.4 2.6 - 4.7 MG/DL   GLUCOSE, POC    Collection Time: 07/08/17  7:01 AM   Result Value Ref Range    Glucose (POC) 269 (H) 65 - 100 mg/dL    Performed by Earnest Conroy  TRACY    NPO except ice chips for now.   Renew TPN/Lipids - will change to cyclic.  IV abx - Continue Zosyn. Will stop Diflucan as no yeast in cultures.  Continue BID Protonix.  Repeat CT Scan abdomen/pelvis today.   Pain medication and anti-emetics as needed.   OOB, Ambulate.  DVT Prophylaxis - Lovenox.  GI input - noted.   Plans per Dr. Cherly Hensen.

## 2017-07-09 ENCOUNTER — Inpatient Hospital Stay: Admit: 2017-07-09 | Payer: BLUE CROSS/BLUE SHIELD | Primary: Internal Medicine

## 2017-07-09 LAB — CBC WITH AUTOMATED DIFF
ABS. BASOPHILS: 0 10*3/uL (ref 0.0–0.1)
ABS. EOSINOPHILS: 0 10*3/uL (ref 0.0–0.4)
ABS. IMM. GRANS.: 0 10*3/uL
ABS. LYMPHOCYTES: 1.4 10*3/uL (ref 0.8–3.5)
ABS. MONOCYTES: 0.5 10*3/uL (ref 0.0–1.0)
ABS. NEUTROPHILS: 4.9 10*3/uL (ref 1.8–8.0)
ABSOLUTE NRBC: 0.02 10*3/uL — ABNORMAL HIGH (ref 0.00–0.01)
BAND NEUTROPHILS: 3 % (ref 0–6)
BASOPHILS: 0 % (ref 0–1)
EOSINOPHILS: 0 % (ref 0–7)
HCT: 32.8 % — ABNORMAL LOW (ref 35.0–47.0)
HGB: 10.2 g/dL — ABNORMAL LOW (ref 11.5–16.0)
IMMATURE GRANULOCYTES: 0 %
LYMPHOCYTES: 20 % (ref 12–49)
MCH: 29.7 PG (ref 26.0–34.0)
MCHC: 31.1 g/dL (ref 30.0–36.5)
MCV: 95.3 FL (ref 80.0–99.0)
METAMYELOCYTES: 3 % — ABNORMAL HIGH
MONOCYTES: 7 % (ref 5–13)
MPV: 9.7 FL (ref 8.9–12.9)
MYELOCYTES: 2 % — ABNORMAL HIGH
NEUTROPHILS: 65 % (ref 32–75)
NRBC: 0.3 PER 100 WBC — ABNORMAL HIGH
PLATELET: 243 10*3/uL (ref 150–400)
RBC: 3.44 M/uL — ABNORMAL LOW (ref 3.80–5.20)
RDW: 15.2 % — ABNORMAL HIGH (ref 11.5–14.5)
WBC: 7.2 10*3/uL (ref 3.6–11.0)

## 2017-07-09 LAB — GLUCOSE, POC
Glucose (POC): 170 mg/dL — ABNORMAL HIGH (ref 65–100)
Glucose (POC): 298 mg/dL — ABNORMAL HIGH (ref 65–100)
Glucose (POC): 184 mg/dL — ABNORMAL HIGH (ref 65–100)
Glucose (POC): 133 mg/dL — ABNORMAL HIGH (ref 65–100)
Glucose (POC): 102 mg/dL — ABNORMAL HIGH (ref 65–100)

## 2017-07-09 LAB — METABOLIC PANEL, BASIC
Anion gap: 8 mmol/L (ref 5–15)
BUN/Creatinine ratio: 26 — ABNORMAL HIGH (ref 12–20)
BUN: 12 MG/DL (ref 6–20)
CO2: 28 mmol/L (ref 21–32)
Calcium: 8.1 MG/DL — ABNORMAL LOW (ref 8.5–10.1)
Chloride: 102 mmol/L (ref 97–108)
Creatinine: 0.46 MG/DL — ABNORMAL LOW (ref 0.55–1.02)
GFR est AA: >60 mL/min/{1.73_m2} (ref >60)
GFR est non-AA: >60 mL/min/{1.73_m2} (ref >60)
Glucose: 254 mg/dL — ABNORMAL HIGH (ref 65–100)
Potassium: 4 mmol/L (ref 3.5–5.1)
Sodium: 138 mmol/L (ref 136–145)

## 2017-07-09 LAB — MAGNESIUM: Magnesium: 1.7 mg/dL (ref 1.6–2.4)

## 2017-07-09 LAB — PHOSPHORUS: Phosphorus: 4.7 MG/DL (ref 2.6–4.7)

## 2017-07-09 MED ORDER — LIDOCAINE (PF) 10 MG/ML (1 %) IJ SOLN
10 mg/mL (1 %) | Freq: Once | INTRAMUSCULAR | Status: AC
Start: 2017-07-09 — End: 2017-07-09
  Administered 2017-07-09: 17:00:00 via INTRAVENOUS

## 2017-07-09 MED ORDER — POTASSIUM PHOSPHATE DIBASIC 3 MMOLE/ML IV
3 mmol/mL | INTRAVENOUS | Status: DC
Start: 2017-07-09 — End: 2017-07-09

## 2017-07-09 MED ORDER — SODIUM CHLORIDE 0.9 % IV
Freq: Once | INTRAVENOUS | Status: AC
Start: 2017-07-09 — End: 2017-07-09
  Administered 2017-07-09: 17:00:00 via INTRAVENOUS

## 2017-07-09 MED ORDER — FENTANYL CITRATE (PF) 50 MCG/ML IJ SOLN
50 mcg/mL | INTRAMUSCULAR | Status: DC | PRN
Start: 2017-07-09 — End: 2017-07-09
  Administered 2017-07-09: 17:00:00 via INTRAVENOUS

## 2017-07-09 MED ORDER — MIDAZOLAM 1 MG/ML IJ SOLN
1 mg/mL | INTRAMUSCULAR | Status: DC | PRN
Start: 2017-07-09 — End: 2017-07-09
  Administered 2017-07-09: 17:00:00 via INTRAVENOUS

## 2017-07-09 MED ORDER — CALCIUM GLUCONATE 100 MG/ML (10%) IV SOLN
10010 mg/mL (10%) | INTRAVENOUS | Status: AC
Start: 2017-07-09 — End: 2017-07-10
  Administered 2017-07-09 – 2017-07-10 (×2): via INTRAVENOUS

## 2017-07-09 MED ORDER — DEXTROSE 70% IN WATER (D70W) IV
4 mEq/mL | INTRAVENOUS | Status: DC
Start: 2017-07-09 — End: 2017-07-09

## 2017-07-09 MED ORDER — IOPAMIDOL 61 % IV SOLN
300 mg iodine /mL (61 %) | Freq: Once | INTRAVENOUS | Status: AC
Start: 2017-07-09 — End: 2017-07-09
  Administered 2017-07-09: 17:00:00 via INTRAVENOUS

## 2017-07-09 MED FILL — FENTANYL CITRATE (PF) 50 MCG/ML IJ SOLN: 50 mcg/mL | INTRAMUSCULAR | Qty: 2

## 2017-07-09 MED FILL — HYDROMORPHONE 2 MG/ML INJECTION SOLUTION: 2 mg/mL | INTRAMUSCULAR | Qty: 1

## 2017-07-09 MED FILL — INSULIN LISPRO 100 UNIT/ML INJECTION: 100 unit/mL | SUBCUTANEOUS | Qty: 1

## 2017-07-09 MED FILL — SIMVASTATIN 20 MG TAB: 20 mg | ORAL | Qty: 2

## 2017-07-09 MED FILL — PIPERACILLIN-TAZOBACTAM 3.375 GRAM IV SOLR: 3.375 gram | INTRAVENOUS | Qty: 3.38

## 2017-07-09 MED FILL — NORMAL SALINE FLUSH 0.9 % INJECTION SYRINGE: INTRAMUSCULAR | Qty: 20

## 2017-07-09 MED FILL — NORMAL SALINE FLUSH 0.9 % INJECTION SYRINGE: INTRAMUSCULAR | Qty: 10

## 2017-07-09 MED FILL — MIDAZOLAM 1 MG/ML IJ SOLN: 1 mg/mL | INTRAMUSCULAR | Qty: 5

## 2017-07-09 MED FILL — AMINOSYN II 10 % IV: 10 % | INTRAVENOUS | Qty: 1000

## 2017-07-09 MED FILL — ENOXAPARIN 40 MG/0.4 ML SUB-Q SYRINGE: 40 mg/0.4 mL | SUBCUTANEOUS | Qty: 0.4

## 2017-07-09 MED FILL — ONDANSETRON (PF) 4 MG/2 ML INJECTION: 4 mg/2 mL | INTRAMUSCULAR | Qty: 2

## 2017-07-09 MED FILL — SERTRALINE 50 MG TAB: 50 mg | ORAL | Qty: 2

## 2017-07-09 MED FILL — XYLOCAINE-MPF 10 MG/ML (1 %) INJECTION SOLUTION: 10 mg/mL (1 %) | INTRAMUSCULAR | Qty: 10

## 2017-07-09 MED FILL — ACETAMINOPHEN 325 MG TABLET: 325 mg | ORAL | Qty: 2

## 2017-07-09 MED FILL — PROTONIX 40 MG INTRAVENOUS SOLUTION: 40 mg | INTRAVENOUS | Qty: 40

## 2017-07-09 MED FILL — ISOVUE-300  61 % INTRAVENOUS SOLUTION: 300 mg iodine /mL (61 %) | INTRAVENOUS | Qty: 100

## 2017-07-09 MED FILL — INSULIN NPH HUMAN RECOMB 100 UNIT/ML INJECTION: 100 unit/mL | SUBCUTANEOUS | Qty: 1

## 2017-07-09 MED FILL — SODIUM CHLORIDE 0.9 % IV: INTRAVENOUS | Qty: 1000

## 2017-07-09 NOTE — Progress Notes (Signed)
[facility].  Medications given, VS, post orders, pain level and mental status reviewed.  Awaiting transport.    Dressing with 4x4 and transparent dressing dry and intact.  Accordion drain connected for suction.

## 2017-07-09 NOTE — Progress Notes (Signed)
Medical Progress Note      NAME: Dana [name]   DOB:  11-Oct-1955  MRM:  [id number]    Date/Time: 07/09/2017  9:18 AM    Problem List:   Principal Problem:    Abdominal pain (07/02/2017)    Active Problems:    HTN (hypertension) ()      Diabetes (HCC) (01/24/2013)      Acute gastric ulcer with perforation (HCC) (05/13/2017)           Subjective:     Patient having more pain, c/o that lower quadrants are bigger.    Past Medical History:   Diagnosis Date   ??? Diabetes (HCC) 01/24/2013   ??? GERD (gastroesophageal reflux disease)    ??? Hives    ??? HTN (hypertension)    ??? Hypercholesterolemia    ??? Perforated gastric ulcer (HCC)        ROS:  General: negative for fever, chills, sweats, weakness  Respiratory:  negative for cough, sputum production, SOB, wheezing, DOE, pleuritic pain  Cardiology:  negative for chest pain, palpitations, orthopnea, PND, edema, syncope   Gastrointestinal: positive for abd pain         Objective:       Vitals:          Last 24hrs VS reviewed since prior progress note. Most recent are:    Visit Vitals  BP 129/68 (BP 1 Location: Left arm, BP Patient Position: At rest)   Pulse 72   Temp 98 ??F (36.7 ??C)   Resp 18   Ht 5\' 4"  (1.626 m)   Wt 256 lb 8 oz (116.3 kg)   SpO2 94%   BMI 44.03 kg/m??     SpO2 Readings from Last 6 Encounters:   07/09/17 94%   06/09/17 98%   05/23/17 98%   05/18/17 93%   05/20/15 94%   11/30/10 100%    O2 Flow Rate (L/min): 3 l/min       Intake/Output Summary (Last 24 hours) at 07/09/2017 0918  Last data filed at 07/09/2017 0831  Gross per 24 hour   Intake 6157 ml   Output ???   Net 6157 ml          Exam:     General   Obese 61 yo wf  Respiratory   Clear To Auscultation bilaterally - no wheezes, rales, rhonchi, or crackles  Cardiology  regular  Abdominal  Generalized tenderness. bilat lq increased tenderness  Extremities  No clubbing, cyanosis, or edema. Pulses intact.    Lab Data Reviewed: (see below)    Medications Reviewed: (see below)     ______________________________________________________________________    Medications:     Current Facility-Administered Medications   Medication Dose Route Frequency   ??? sodium chloride (NS) flush 20 mL  20 mL InterCATHeter PRN   ??? sodium chloride (NS) flush 10 mL  10 mL InterCATHeter Q24H   ??? sodium chloride (NS) flush 10 mL  10 mL InterCATHeter PRN   ??? sodium chloride (NS) flush 10 mL  10 mL InterCATHeter Q8H   ??? alteplase (CATHFLO) 1 mg in sterile water (preservative free) 1 mL injection  1 mg InterCATHeter PRN   ??? bacitracin 500 unit/gram packet 1 Packet  1 Packet Topical PRN   ??? fat emulsion 20% (LIPOSYN, INTRAlipid) infusion 500 mL  500 mL IntraVENous Q MON, WED & FRI   ??? HYDROmorphone (DILAUDID) injection 2 mg  2 mg IntraVENous Q3H PRN   ??? enoxaparin (LOVENOX) injection 40 mg  40 mg SubCUTAneous DAILY   ??? pantoprazole (PROTONIX) 40 mg in sodium chloride 0.9% 10 mL injection  40 mg IntraVENous Q12H   ??? piperacillin-tazobactam (ZOSYN) 3.375 g in 0.9% sodium chloride (MBP/ADV) 100 mL  3.375 g IntraVENous Q8H   ??? simvastatin (ZOCOR) tablet 40 mg  40 mg Oral QHS   ??? sertraline (ZOLOFT) tablet 100 mg  100 mg Oral DAILY   ??? sodium chloride (NS) flush 5-10 mL  5-10 mL IntraVENous Q8H   ??? sodium chloride (NS) flush 5-10 mL  5-10 mL IntraVENous PRN   ??? acetaminophen (TYLENOL) tablet 650 mg  650 mg Oral Q4H PRN   ??? ondansetron (ZOFRAN) injection 4 mg  4 mg IntraVENous Q4H PRN   ??? hydrALAZINE (APRESOLINE) 20 mg/mL injection 20 mg  20 mg IntraVENous Q6H PRN   ??? insulin lispro (HUMALOG) injection   SubCUTAneous AC&HS   ??? glucose chewable tablet 16 g  4 Tab Oral PRN   ??? dextrose (D50W) injection syrg 12.5-25 g  12.5-25 g IntraVENous PRN   ??? glucagon (GLUCAGEN) injection 1 mg  1 mg IntraMUSCular PRN   ??? cloNIDine HCl (CATAPRES) tablet 0.1 mg  0.1 mg Oral Q4H PRN   ??? insulin NPH (NOVOLIN N, HUMULIN N) injection 20 Units  20 Units SubCUTAneous QHS            Lab Review:     Recent Labs     07/09/17  0213 07/08/17   0404 07/07/17  0501   WBC 7.2 10.5 9.9   HGB 10.2* 10.3* 10.1*   HCT 32.8* 33.5* 33.2*   PLT 243 248 213     Recent Labs     07/09/17  0213 07/08/17  0404 07/07/17  0501   NA 138 135* 136   K 4.0 4.3 4.1   CL 102 104 102   CO2 28 25 27    GLU 254* 188* 301*   BUN 12 13 16    CREA 0.46* 0.44* 0.54*   CA 8.1* 8.5 8.2*   MG 1.7 1.9 1.9   PHOS 4.7 3.4 2.2*     Lab Results   Component Value Date/Time    Glucose (POC) 184 (H) 07/09/2017 09:16 AM    Glucose (POC) 298 (H) 07/09/2017 06:24 AM    Glucose (POC) 170 (H) 07/08/2017 08:38 PM    Glucose (POC) 126 (H) 07/08/2017 04:08 PM    Glucose (POC) 248 (H) 07/08/2017 11:12 AM     No results for input(s): PH, PCO2, PO2, HCO3, FIO2 in the last 72 hours.  No results for input(s): INR in the last 72 hours.    No lab exists for component: INREXT    Other pertinent lab: NA         Assessment:     Patient Active Problem List   Diagnosis Code   ??? Hypercholesterolemia E78.00   ??? HTN (hypertension) I10   ??? Diabetes (HCC) E11.9   ??? Sleep apnea G47.30   ??? Hypertension complicating diabetes (HCC) E11.59, I10   ??? Morbid obesity due to excess calories (HCC) E66.01   ??? Hives L50.9   ??? Acute gastric ulcer with perforation (HCC) K25.1   ??? Arthralgia of both knees M25.561, M25.562   ??? Abdominal pain R10.9          Plan:                 1. Bowel perf- ct indeterminate- ?  Off steroids . ?repeat ct? Surgery??  2.  Diabetes- add insulin to tpn, continue lantus and ssi                ___________________________________________________    Attending Physician: Lake BellsNancy J Nzinga Ferran, MD

## 2017-07-09 NOTE — Progress Notes (Signed)
Spiritual Care Partner Volunteer visited patient in room 620/02 on 11.03.18.  Documented by: Chaplain: Rev. Cyndia Diverachel K. Cobb, MDiv; BCC, to contact Spiritual Care Services call: [phone]

## 2017-07-09 NOTE — Progress Notes (Signed)
Progress Note    Patient: Dana [name] MRN: [id number]  SSN: [id number]    Date of Birth: [birth year]  Age: 61 y.o.  Sex: female      Admit Date: 07/02/2017    Gastric leak    Subjective:     No acute surgical issues.  Pt reported significant pain to her lower abdominal wall bilaterally.  Pt with leakage from drainage bag.      Pt feeling frustrated with plan of care and feels her pain is not adequately addressed.  Tried to reassure patient as given degree of inflammation with contained leak, an operation would do more harm than good.      Objective:     Visit Vitals  BP 129/68 (BP 1 Location: Left arm, BP Patient Position: At rest)   Pulse 72   Temp 98 ??F (36.7 ??C)   Resp 18   Ht 5\' 4"  (1.626 m)   Wt 256 lb 8 oz (116.3 kg)   SpO2 94%   BMI 44.03 kg/m??       Temp (24hrs), Avg:97.8 ??F (36.6 ??C), Min:97.6 ??F (36.4 ??C), Max:98 ??F (36.7 ??C)        Physical Exam:    Gen:  NAD  Pulm:  Unlabored  Abd:  S/ND/appropriate TTP  Wound:  C/D/I    Recent Results (from the past 24 hour(s))   GLUCOSE, POC    Collection Time: 07/08/17 11:12 AM   Result Value Ref Range    Glucose (POC) 248 (H) 65 - 100 mg/dL    Performed by Lenoria Farrier    GLUCOSE, POC    Collection Time: 07/08/17  4:08 PM   Result Value Ref Range    Glucose (POC) 126 (H) 65 - 100 mg/dL    Performed by [facility]    GLUCOSE, POC    Collection Time: 07/08/17  8:38 PM   Result Value Ref Range    Glucose (POC) 170 (H) 65 - 100 mg/dL    Performed by [facility] EVA GRACE    CBC WITH AUTOMATED DIFF    Collection Time: 07/09/17  2:13 AM   Result Value Ref Range    WBC 7.2 3.6 - 11.0 K/uL    RBC 3.44 (L) 3.80 - 5.20 M/uL    HGB 10.2 (L) 11.5 - 16.0 g/dL    HCT 78.2 (L) 95.6 - 47.0 %    MCV 95.3 80.0 - 99.0 FL    MCH 29.7 26.0 - 34.0 PG    MCHC 31.1 30.0 - 36.5 g/dL    RDW 21.3 (H) 08.6 - 14.5 %    PLATELET 243 150 - 400 K/uL    MPV 9.7 8.9 - 12.9 FL    NRBC 0.3 (H) 0 PER 100 WBC    ABSOLUTE NRBC 0.02 (H) 0.00 - 0.01 K/uL    NEUTROPHILS 65 32 - 75 %     BAND NEUTROPHILS 3 0 - 6 %    LYMPHOCYTES 20 12 - 49 %    MONOCYTES 7 5 - 13 %    EOSINOPHILS 0 0 - 7 %    BASOPHILS 0 0 - 1 %    METAMYELOCYTES 3 (H) 0 %    MYELOCYTES 2 (H) 0 %    IMMATURE GRANULOCYTES 0 %    ABS. NEUTROPHILS 4.9 1.8 - 8.0 K/UL    ABS. LYMPHOCYTES 1.4 0.8 - 3.5 K/UL    ABS. MONOCYTES 0.5 0.0 - 1.0 K/UL    ABS. EOSINOPHILS 0.0  0.0 - 0.4 K/UL    ABS. BASOPHILS 0.0 0.0 - 0.1 K/UL    ABS. IMM. GRANS. 0.0 K/UL    DF MANUAL      RBC COMMENTS ANISOCYTOSIS  1+        RBC COMMENTS MACROCYTOSIS  1+       METABOLIC PANEL, BASIC    Collection Time: 07/09/17  2:13 AM   Result Value Ref Range    Sodium 138 136 - 145 mmol/L    Potassium 4.0 3.5 - 5.1 mmol/L    Chloride 102 97 - 108 mmol/L    CO2 28 21 - 32 mmol/L    Anion gap 8 5 - 15 mmol/L    Glucose 254 (H) 65 - 100 mg/dL    BUN 12 6 - 20 MG/DL    Creatinine 1.610.46 (L) 0.55 - 1.02 MG/DL    BUN/Creatinine ratio 26 (H) 12 - 20      GFR est AA >60 >60 ml/min/1.2573m2    GFR est non-AA >60 >60 ml/min/1.5273m2    Calcium 8.1 (L) 8.5 - 10.1 MG/DL   MAGNESIUM    Collection Time: 07/09/17  2:13 AM   Result Value Ref Range    Magnesium 1.7 1.6 - 2.4 mg/dL   PHOSPHORUS    Collection Time: 07/09/17  2:13 AM   Result Value Ref Range    Phosphorus 4.7 2.6 - 4.7 MG/DL   GLUCOSE, POC    Collection Time: 07/09/17  6:24 AM   Result Value Ref Range    Glucose (POC) 298 (H) 65 - 100 mg/dL    Performed by Katheran JamesWENS CRYSTAL    GLUCOSE, POC    Collection Time: 07/09/17  9:16 AM   Result Value Ref Range    Glucose (POC) 184 (H) 65 - 100 mg/dL    [facility]          Assessment:     Hospital Problems  Date Reviewed: 07/01/2017          Codes Class Noted POA    * (Principal) Abdominal pain ICD-10-CM: R10.9  ICD-9-CM: 789.00  07/02/2017 Yes        Acute gastric ulcer with perforation (HCC) ICD-10-CM: K25.1  ICD-9-CM: 531.10  05/13/2017 Yes        Diabetes (HCC) ICD-10-CM: E11.9  ICD-9-CM: 250.00  01/24/2013 Yes        HTN (hypertension) ICD-10-CM: I10   ICD-9-CM: 401.9  Unknown Yes              Plan/Recommendations/Medical Decision Making:     - Perforated gastric ulcer:  No acute indication for operation.  CT scan reviewed.  Catheter appeared to have migrated but is still in peritoneal cavity although suboptimal location and perhaps causing SubQ emphysema.  - Continue antibiotic therapy  - Monitor drain output  - Abdominal binder      Signed By: Kandis CockingSihong Trenyce Loera, MD     July 09, 2017

## 2017-07-09 NOTE — Progress Notes (Signed)
Transport p[resent to return patient to 620 via stretcher.

## 2017-07-09 NOTE — H&P (Signed)
Radiology History and Physical    Patient: Dana [name] 61 y.o. female       Chief Complaint: Abdominal Pain      History of Present Illness: abdominal drain malposition     History:    Past Medical History:   Diagnosis Date   ??? Diabetes (HCC) 01/24/2013   ??? GERD (gastroesophageal reflux disease)    ??? Hives    ??? HTN (hypertension)    ??? Hypercholesterolemia    ??? Perforated gastric ulcer (HCC)      Family History   Problem Relation Age of Onset   ??? Hypertension Mother    ??? Cancer Father         mesothelioma   ??? Diabetes Father    ??? Hypertension Father      Social History     Socioeconomic History   ??? Marital status: SINGLE     Spouse name: Not on file   ??? Number of children: Not on file   ??? Years of education: Not on file   ??? Highest education level: Not on file   Social Needs   ??? Financial resource strain: Not on file   ??? Food insecurity - worry: Not on file   ??? Food insecurity - inability: Not on file   ??? Transportation needs - medical: Not on file   ??? Transportation needs - non-medical: Not on file   Occupational History   ??? Not on file   Tobacco Use   ??? Smoking status: Former Smoker     Packs/day: 1.00     Last attempt to quit: 06/27/2011     Years since quitting: 6.0   ??? Smokeless tobacco: Never Used   Substance and Sexual Activity   ??? Alcohol use: Yes     Alcohol/week: 0.6 - 1.2 oz     Types: 1 - 2 Glasses of wine per week     Comment: Per month    ??? Drug use: No   ??? Sexual activity: Yes   Other Topics Concern   ??? Not on file   Social History Narrative   ??? Not on file       Allergies:   Allergies   Allergen Reactions   ??? Adhesive Itching   ??? Dolobid [Diflunisal] Itching     Itching,swelling       Current Medications:  Current Facility-Administered Medications   Medication Dose Route Frequency   ??? TPN ADULT - CENTRAL   IntraVENous CONTINUOUS   ??? sodium chloride (NS) flush 20 mL  20 mL InterCATHeter PRN   ??? sodium chloride (NS) flush 10 mL  10 mL InterCATHeter Q24H    ??? sodium chloride (NS) flush 10 mL  10 mL InterCATHeter PRN   ??? sodium chloride (NS) flush 10 mL  10 mL InterCATHeter Q8H   ??? alteplase (CATHFLO) 1 mg in sterile water (preservative free) 1 mL injection  1 mg InterCATHeter PRN   ??? bacitracin 500 unit/gram packet 1 Packet  1 Packet Topical PRN   ??? fat emulsion 20% (LIPOSYN, INTRAlipid) infusion 500 mL  500 mL IntraVENous Q MON, WED & FRI   ??? HYDROmorphone (DILAUDID) injection 2 mg  2 mg IntraVENous Q3H PRN   ??? enoxaparin (LOVENOX) injection 40 mg  40 mg SubCUTAneous DAILY   ??? pantoprazole (PROTONIX) 40 mg in sodium chloride 0.9% 10 mL injection  40 mg IntraVENous Q12H   ??? piperacillin-tazobactam (ZOSYN) 3.375 g in 0.9% sodium chloride (MBP/ADV) 100 mL  3.375  g IntraVENous Q8H   ??? simvastatin (ZOCOR) tablet 40 mg  40 mg Oral QHS   ??? sertraline (ZOLOFT) tablet 100 mg  100 mg Oral DAILY   ??? sodium chloride (NS) flush 5-10 mL  5-10 mL IntraVENous Q8H   ??? sodium chloride (NS) flush 5-10 mL  5-10 mL IntraVENous PRN   ??? acetaminophen (TYLENOL) tablet 650 mg  650 mg Oral Q4H PRN   ??? ondansetron (ZOFRAN) injection 4 mg  4 mg IntraVENous Q4H PRN   ??? hydrALAZINE (APRESOLINE) 20 mg/mL injection 20 mg  20 mg IntraVENous Q6H PRN   ??? insulin lispro (HUMALOG) injection   SubCUTAneous AC&HS   ??? glucose chewable tablet 16 g  4 Tab Oral PRN   ??? dextrose (D50W) injection syrg 12.5-25 g  12.5-25 g IntraVENous PRN   ??? glucagon (GLUCAGEN) injection 1 mg  1 mg IntraMUSCular PRN   ??? cloNIDine HCl (CATAPRES) tablet 0.1 mg  0.1 mg Oral Q4H PRN   ??? insulin NPH (NOVOLIN N, HUMULIN N) injection 20 Units  20 Units SubCUTAneous QHS        Physical Exam:  Blood pressure 134/84, pulse 65, temperature 98.4 ??F (36.9 ??C), resp. rate 15, height 5\' 4"  (1.626 m), weight 116.3 kg (256 lb 8 oz), SpO2 96 %.  LUNG: clear to auscultation bilaterally, HEART: regular rate and rhythm, S1, S2 normal, no murmur, click, rub or gallop      Alerts:    Hospital Problems  Date Reviewed: 07/02/2017           Codes Class Noted POA    * (Principal) Abdominal pain ICD-10-CM: R10.9  ICD-9-CM: 789.00  07/02/2017 Yes        Acute gastric ulcer with perforation (HCC) ICD-10-CM: K25.1  ICD-9-CM: 531.10  05/13/2017 Yes        Diabetes (HCC) ICD-10-CM: E11.9  ICD-9-CM: 250.00  01/24/2013 Yes        HTN (hypertension) ICD-10-CM: I10  ICD-9-CM: 401.9  Unknown Yes              Laboratory:      Recent Labs     07/09/17  0213   HGB 10.2*   HCT 32.8*   WBC 7.2   PLT 243   BUN 12   CREA 0.46*   K 4.0         Plan of Care/Planned Procedure:  Risks, benefits, and alternatives reviewed with patient and she agrees to proceed with the procedure.     Plan is for abdominal drain change and upsize       Kerin Perna, MD

## 2017-07-09 NOTE — Progress Notes (Signed)
Bedside and Verbal shift change report given to Yvonna AlanisKaitlyn, Charity fundraiserN (Cabin crewoncoming nurse) by Lissa HoardSonia, RN (offgoing nurse). Report included the following information SBAR, Kardex and MAR.     Pt is resting in bed with no distress noted. Call light in reach and bed in lowest position.    The ending of Daylight Saving Time occurred at 0200 hrs. Documentation of patient care and medications administered is done with respect to the time change.

## 2017-07-09 NOTE — Progress Notes (Signed)
Rcvd. In angio via stretcher.  Dr. Liane ComberMahajan in to evaluate patient and obtain consent. Patient verbalizes understanding of procedure and consent signed by same.

## 2017-07-10 LAB — CBC WITH AUTOMATED DIFF
ABS. BASOPHILS: 0 10*3/uL (ref 0.0–0.1)
ABS. EOSINOPHILS: 0.1 10*3/uL (ref 0.0–0.4)
ABS. IMM. GRANS.: 0.4 10*3/uL — ABNORMAL HIGH (ref 0.00–0.04)
ABS. LYMPHOCYTES: 2 10*3/uL (ref 0.8–3.5)
ABS. MONOCYTES: 0.6 10*3/uL (ref 0.0–1.0)
ABS. NEUTROPHILS: 4.8 10*3/uL (ref 1.8–8.0)
ABSOLUTE NRBC: 0 10*3/uL (ref 0.00–0.01)
BASOPHILS: 0 % (ref 0–1)
EOSINOPHILS: 1 % (ref 0–7)
HCT: 31.7 % — ABNORMAL LOW (ref 35.0–47.0)
HGB: 9.7 g/dL — ABNORMAL LOW (ref 11.5–16.0)
IMMATURE GRANULOCYTES: 5 % — ABNORMAL HIGH (ref 0.0–0.5)
LYMPHOCYTES: 25 % (ref 12–49)
MCH: 29.1 PG (ref 26.0–34.0)
MCHC: 30.6 g/dL (ref 30.0–36.5)
MCV: 95.2 FL (ref 80.0–99.0)
MONOCYTES: 7 % (ref 5–13)
MPV: 10.2 FL (ref 8.9–12.9)
NEUTROPHILS: 62 % (ref 32–75)
NRBC: 0 PER 100 WBC
PLATELET: 221 10*3/uL (ref 150–400)
RBC: 3.33 M/uL — ABNORMAL LOW (ref 3.80–5.20)
RDW: 15.2 % — ABNORMAL HIGH (ref 11.5–14.5)
WBC: 7.9 10*3/uL (ref 3.6–11.0)

## 2017-07-10 LAB — METABOLIC PANEL, BASIC
Anion gap: 9 mmol/L (ref 5–15)
BUN/Creatinine ratio: 30 — ABNORMAL HIGH (ref 12–20)
BUN: 14 MG/DL (ref 6–20)
CO2: 28 mmol/L (ref 21–32)
Calcium: 8.2 MG/DL — ABNORMAL LOW (ref 8.5–10.1)
Chloride: 102 mmol/L (ref 97–108)
Creatinine: 0.46 MG/DL — ABNORMAL LOW (ref 0.55–1.02)
GFR est AA: >60 mL/min/{1.73_m2} (ref >60)
GFR est non-AA: >60 mL/min/{1.73_m2} (ref >60)
Glucose: 162 mg/dL — ABNORMAL HIGH (ref 65–100)
Potassium: 4.2 mmol/L (ref 3.5–5.1)
Sodium: 139 mmol/L (ref 136–145)

## 2017-07-10 LAB — GLUCOSE, POC
Glucose (POC): 189 mg/dL — ABNORMAL HIGH (ref 65–100)
Glucose (POC): 195 mg/dL — ABNORMAL HIGH (ref 65–100)
Glucose (POC): 157 mg/dL — ABNORMAL HIGH (ref 65–100)
Glucose (POC): 129 mg/dL — ABNORMAL HIGH (ref 65–100)
Glucose (POC): 138 mg/dL — ABNORMAL HIGH (ref 65–100)

## 2017-07-10 LAB — PHOSPHORUS: Phosphorus: 4.9 MG/DL — ABNORMAL HIGH (ref 2.6–4.7)

## 2017-07-10 LAB — MAGNESIUM: Magnesium: 1.7 mg/dL (ref 1.6–2.4)

## 2017-07-10 MED ORDER — CALCIUM GLUCONATE 100 MG/ML (10%) IV SOLN
10010 mg/mL (10%) | INTRAVENOUS | Status: AC
Start: 2017-07-10 — End: 2017-07-11
  Administered 2017-07-11 (×2): via INTRAVENOUS

## 2017-07-10 MED FILL — ENOXAPARIN 40 MG/0.4 ML SUB-Q SYRINGE: 40 mg/0.4 mL | SUBCUTANEOUS | Qty: 0.4

## 2017-07-10 MED FILL — NORMAL SALINE FLUSH 0.9 % INJECTION SYRINGE: INTRAMUSCULAR | Qty: 10

## 2017-07-10 MED FILL — HYDROMORPHONE 2 MG/ML INJECTION SOLUTION: 2 mg/mL | INTRAMUSCULAR | Qty: 1

## 2017-07-10 MED FILL — SERTRALINE 50 MG TAB: 50 mg | ORAL | Qty: 2

## 2017-07-10 MED FILL — ACETAMINOPHEN 325 MG TABLET: 325 mg | ORAL | Qty: 2

## 2017-07-10 MED FILL — INSULIN NPH HUMAN RECOMB 100 UNIT/ML INJECTION: 100 unit/mL | SUBCUTANEOUS | Qty: 1

## 2017-07-10 MED FILL — INSULIN LISPRO 100 UNIT/ML INJECTION: 100 unit/mL | SUBCUTANEOUS | Qty: 1

## 2017-07-10 MED FILL — PIPERACILLIN-TAZOBACTAM 3.375 GRAM IV SOLR: 3.375 gram | INTRAVENOUS | Qty: 3.38

## 2017-07-10 MED FILL — ONDANSETRON (PF) 4 MG/2 ML INJECTION: 4 mg/2 mL | INTRAMUSCULAR | Qty: 2

## 2017-07-10 MED FILL — AMINOSYN II 10 % IV: 10 % | INTRAVENOUS | Qty: 1000

## 2017-07-10 MED FILL — PROTONIX 40 MG INTRAVENOUS SOLUTION: 40 mg | INTRAVENOUS | Qty: 40

## 2017-07-10 MED FILL — SIMVASTATIN 20 MG TAB: 20 mg | ORAL | Qty: 2

## 2017-07-10 NOTE — Progress Notes (Signed)
Medical Progress Note      NAME: Dana [name]   DOB:  [birth year]  MRM:  [id number]    Date/Time: 07/10/2017  8:50 AM    Problem List:   Principal Problem:    Abdominal pain (07/02/2017)    Active Problems:    HTN (hypertension) ()      Diabetes (HCC) (01/24/2013)      Acute gastric ulcer with perforation (HCC) (05/13/2017)           Subjective:     Patient feeling better today. Drain is draining.    Past Medical History:   Diagnosis Date   ??? Diabetes (HCC) 01/24/2013   ??? GERD (gastroesophageal reflux disease)    ??? Hives    ??? HTN (hypertension)    ??? Hypercholesterolemia    ??? Perforated gastric ulcer (HCC)        ROS:  General: negative for fever, chills, sweats, weakness  Respiratory:  negative for cough, sputum production, SOB, wheezing, DOE, pleuritic pain  Cardiology:  negative for chest pain, palpitations, orthopnea, PND, edema, syncope   Gastrointestinal: positive for abd pain- iomproving         Objective:       Vitals:          Last 24hrs VS reviewed since prior progress note. Most recent are:    Visit Vitals  BP 147/85 (BP 1 Location: Left arm, BP Patient Position: At rest)   Pulse 67   Temp 97.7 ??F (36.5 ??C)   Resp 16   Ht 5\' 4"  (1.626 m)   Wt 252 lb 11.2 oz (114.6 kg)   SpO2 96%   BMI 43.38 kg/m??     SpO2 Readings from Last 6 Encounters:   07/10/17 96%   06/09/17 98%   05/23/17 98%   05/18/17 93%   05/20/15 94%   11/30/10 100%    O2 Flow Rate (L/min): 2 l/min       Intake/Output Summary (Last 24 hours) at 07/10/2017 0850  Last data filed at 07/10/2017 0723  Gross per 24 hour   Intake 2293.57 ml   Output ???   Net 2293.57 ml          Exam:     General   Obese 61yo wf  Respiratory   Clear To Auscultation bilaterally - no wheezes, rales, rhonchi, or crackles  Cardiology  regular  Abdominal  Soft, generalized tenderness- improving  Extremities  No clubbing, cyanosis, or edema. Pulses intact.    Lab Data Reviewed: (see below)    Medications Reviewed: (see below)     ______________________________________________________________________    Medications:     Current Facility-Administered Medications   Medication Dose Route Frequency   ??? sodium chloride (NS) flush 20 mL  20 mL InterCATHeter PRN   ??? sodium chloride (NS) flush 10 mL  10 mL InterCATHeter Q24H   ??? sodium chloride (NS) flush 10 mL  10 mL InterCATHeter PRN   ??? sodium chloride (NS) flush 10 mL  10 mL InterCATHeter Q8H   ??? alteplase (CATHFLO) 1 mg in sterile water (preservative free) 1 mL injection  1 mg InterCATHeter PRN   ??? bacitracin 500 unit/gram packet 1 Packet  1 Packet Topical PRN   ??? fat emulsion 20% (LIPOSYN, INTRAlipid) infusion 500 mL  500 mL IntraVENous Q MON, WED & FRI   ??? HYDROmorphone (DILAUDID) injection 2 mg  2 mg IntraVENous Q3H PRN   ??? enoxaparin (LOVENOX) injection 40 mg  40 mg SubCUTAneous DAILY   ???  pantoprazole (PROTONIX) 40 mg in sodium chloride 0.9% 10 mL injection  40 mg IntraVENous Q12H   ??? piperacillin-tazobactam (ZOSYN) 3.375 g in 0.9% sodium chloride (MBP/ADV) 100 mL  3.375 g IntraVENous Q8H   ??? simvastatin (ZOCOR) tablet 40 mg  40 mg Oral QHS   ??? sertraline (ZOLOFT) tablet 100 mg  100 mg Oral DAILY   ??? sodium chloride (NS) flush 5-10 mL  5-10 mL IntraVENous Q8H   ??? sodium chloride (NS) flush 5-10 mL  5-10 mL IntraVENous PRN   ??? acetaminophen (TYLENOL) tablet 650 mg  650 mg Oral Q4H PRN   ??? ondansetron (ZOFRAN) injection 4 mg  4 mg IntraVENous Q4H PRN   ??? hydrALAZINE (APRESOLINE) 20 mg/mL injection 20 mg  20 mg IntraVENous Q6H PRN   ??? insulin lispro (HUMALOG) injection   SubCUTAneous AC&HS   ??? glucose chewable tablet 16 g  4 Tab Oral PRN   ??? dextrose (D50W) injection syrg 12.5-25 g  12.5-25 g IntraVENous PRN   ??? glucagon (GLUCAGEN) injection 1 mg  1 mg IntraMUSCular PRN   ??? cloNIDine HCl (CATAPRES) tablet 0.1 mg  0.1 mg Oral Q4H PRN   ??? insulin NPH (NOVOLIN N, HUMULIN N) injection 20 Units  20 Units SubCUTAneous QHS            Lab Review:     Recent Labs     07/10/17  0015 07/09/17   0213 07/08/17  0404   WBC 7.9 7.2 10.5   HGB 9.7* 10.2* 10.3*   HCT 31.7* 32.8* 33.5*   PLT 221 243 248     Recent Labs     07/10/17  0015 07/09/17  0213 07/08/17  0404   NA 139 138 135*   K 4.2 4.0 4.3   CL 102 102 104   CO2 28 28 25    GLU 162* 254* 188*   BUN 14 12 13    CREA 0.46* 0.46* 0.44*   CA 8.2* 8.1* 8.5   MG 1.7 1.7 1.9   PHOS 4.9* 4.7 3.4     Lab Results   Component Value Date/Time    Glucose (POC) 157 (H) 07/10/2017 07:33 AM    Glucose (POC) 195 (H) 07/10/2017 06:29 AM    Glucose (POC) 189 (H) 07/09/2017 09:04 PM    Glucose (POC) 102 (H) 07/09/2017 04:30 PM    Glucose (POC) 133 (H) 07/09/2017 11:34 AM     No results for input(s): PH, PCO2, PO2, HCO3, FIO2 in the last 72 hours.  No results for input(s): INR in the last 72 hours.    No lab exists for component: INREXT    Other pertinent lab: NA         Assessment:     Patient Active Problem List   Diagnosis Code   ??? Hypercholesterolemia E78.00   ??? HTN (hypertension) I10   ??? Diabetes (HCC) E11.9   ??? Sleep apnea G47.30   ??? Hypertension complicating diabetes (HCC) E11.59, I10   ??? Morbid obesity due to excess calories (HCC) E66.01   ??? Hives L50.9   ??? Acute gastric ulcer with perforation (HCC) K25.1   ??? Arthralgia of both knees M25.561, M25.562   ??? Abdominal pain R10.9          Plan:                 1. Bowel perf s/p drain repostitioning- improving  2. Diabetes- blood sugars improved off steroids  3. Nutrition- on tpn  4.  Oob, ambulate                  ___________________________________________________    Attending Physician: Lake BellsNancy J Miquel Lamson, MD

## 2017-07-10 NOTE — Progress Notes (Signed)
Bedside and Verbal shift change report given to Yvonna AlanisKaitlyn, Charity fundraiserN (Cabin crewoncoming nurse) by Lissa HoardSonia, RN (offgoing nurse). Report included the following information SBAR, Kardex and MAR.     Pt resting in bed with

## 2017-07-10 NOTE — Progress Notes (Signed)
Progress Note    Patient: Dana [name] MRN: [id number]  SSN: [id number]    Date of Birth: [birth year]  Age: 61 y.o.  Sex: female      Admit Date: 07/02/2017    Gastric leak    Subjective:     No acute surgical issues.  Pt reported feeling a bit better after catheter exchanged yesterday.  No nausea or vomiting.  Passing flatus.      Objective:     Visit Vitals  BP 147/85 (BP 1 Location: Left arm, BP Patient Position: At rest)   Pulse 67   Temp 97.7 ??F (36.5 ??C)   Resp 16   Ht 5\' 4"  (1.626 m)   Wt 252 lb 11.2 oz (114.6 kg)   SpO2 96%   BMI 43.38 kg/m??       Temp (24hrs), Avg:98.1 ??F (36.7 ??C), Min:97.7 ??F (36.5 ??C), Max:98.4 ??F (36.9 ??C)        Physical Exam:    Gen:  NAD  Pulm:  Unlabored  Abd:  S/ND/appropriate TTP  Wound:  C/D/I    Recent Results (from the past 24 hour(s))   GLUCOSE, POC    Collection Time: 07/09/17 11:34 AM   Result Value Ref Range    Glucose (POC) 133 (H) 65 - 100 mg/dL    Performed by [facility]    GLUCOSE, POC    Collection Time: 07/09/17  4:30 PM   Result Value Ref Range    Glucose (POC) 102 (H) 65 - 100 mg/dL    Performed by Andrew Au    GLUCOSE, POC    Collection Time: 07/09/17  9:04 PM   Result Value Ref Range    Glucose (POC) 189 (H) 65 - 100 mg/dL    Performed by [facility]    CBC WITH AUTOMATED DIFF    Collection Time: 07/10/17 12:15 AM   Result Value Ref Range    WBC 7.9 3.6 - 11.0 K/uL    RBC 3.33 (L) 3.80 - 5.20 M/uL    HGB 9.7 (L) 11.5 - 16.0 g/dL    HCT 56.2 (L) 13.0 - 47.0 %    MCV 95.2 80.0 - 99.0 FL    MCH 29.1 26.0 - 34.0 PG    MCHC 30.6 30.0 - 36.5 g/dL    RDW 86.5 (H) 78.4 - 14.5 %    PLATELET 221 150 - 400 K/uL    MPV 10.2 8.9 - 12.9 FL    NRBC 0.0 0 PER 100 WBC    ABSOLUTE NRBC 0.00 0.00 - 0.01 K/uL    NEUTROPHILS 62 32 - 75 %    LYMPHOCYTES 25 12 - 49 %    MONOCYTES 7 5 - 13 %    EOSINOPHILS 1 0 - 7 %    BASOPHILS 0 0 - 1 %    IMMATURE GRANULOCYTES 5 (H) 0.0 - 0.5 %    ABS. NEUTROPHILS 4.8 1.8 - 8.0 K/UL    ABS. LYMPHOCYTES 2.0 0.8 - 3.5 K/UL     ABS. MONOCYTES 0.6 0.0 - 1.0 K/UL    ABS. EOSINOPHILS 0.1 0.0 - 0.4 K/UL    ABS. BASOPHILS 0.0 0.0 - 0.1 K/UL    ABS. IMM. GRANS. 0.4 (H) 0.00 - 0.04 K/UL    DF SMEAR SCANNED      PLATELET COMMENTS Large Platelets      RBC COMMENTS ANISOCYTOSIS  1+        RBC COMMENTS MACROCYTOSIS  1+  METABOLIC PANEL, BASIC    Collection Time: 07/10/17 12:15 AM   Result Value Ref Range    Sodium 139 136 - 145 mmol/L    Potassium 4.2 3.5 - 5.1 mmol/L    Chloride 102 97 - 108 mmol/L    CO2 28 21 - 32 mmol/L    Anion gap 9 5 - 15 mmol/L    Glucose 162 (H) 65 - 100 mg/dL    BUN 14 6 - 20 MG/DL    Creatinine 5.400.46 (L) 0.55 - 1.02 MG/DL    BUN/Creatinine ratio 30 (H) 12 - 20      GFR est AA >60 >60 ml/min/1.4373m2    GFR est non-AA >60 >60 ml/min/1.173m2    Calcium 8.2 (L) 8.5 - 10.1 MG/DL   MAGNESIUM    Collection Time: 07/10/17 12:15 AM   Result Value Ref Range    Magnesium 1.7 1.6 - 2.4 mg/dL   PHOSPHORUS    Collection Time: 07/10/17 12:15 AM   Result Value Ref Range    Phosphorus 4.9 (H) 2.6 - 4.7 MG/DL   GLUCOSE, POC    Collection Time: 07/10/17  6:29 AM   Result Value Ref Range    Glucose (POC) 195 (H) 65 - 100 mg/dL    Performed by Prentice DockerLynch Brenda    GLUCOSE, POC    Collection Time: 07/10/17  7:33 AM   Result Value Ref Range    Glucose (POC) 157 (H) 65 - 100 mg/dL    [facility]          Assessment:     Hospital Problems  Date Reviewed: 07/01/2017          Codes Class Noted POA    * (Principal) Abdominal pain ICD-10-CM: R10.9  ICD-9-CM: 789.00  07/02/2017 Yes        Acute gastric ulcer with perforation (HCC) ICD-10-CM: K25.1  ICD-9-CM: 531.10  05/13/2017 Yes        Diabetes (HCC) ICD-10-CM: E11.9  ICD-9-CM: 250.00  01/24/2013 Yes        HTN (hypertension) ICD-10-CM: I10  ICD-9-CM: 401.9  Unknown Yes              Plan/Recommendations/Medical Decision Making:     - Perforated gastric ulcer:  No acute indication for operation.  Continue to monitor drain output  - Continue antibiotic therapy  - Renew TPN   - NPO with sips of clears  - Labs in am  - Repeat upper GI study on Tuesday to evaluate for resolution of leak    Signed By: Kandis CockingSihong Torsha Lemus, MD     July 10, 2017

## 2017-07-10 NOTE — Progress Notes (Signed)
Problem: Falls - Risk of  Goal: *Absence of Falls  Document Schmid Fall Risk and appropriate interventions in the flowsheet.  Outcome: Progressing Towards Goal  Fall Risk Interventions:  Mobility Interventions: Communicate number of staff needed for ambulation/transfer         Medication Interventions: Teach patient to arise slowly    Elimination Interventions: Call light in reach             Problem: Pressure Injury - Risk of  Goal: *Prevention of pressure injury  Document Braden Scale and appropriate interventions in the flowsheet.  Outcome: Progressing Towards Goal  Pressure Injury Interventions:  Sensory Interventions: Float heels, Keep linens dry and wrinkle-free    Moisture Interventions: Absorbent underpads    Activity Interventions: Increase time out of bed    Mobility Interventions: Pressure redistribution bed/mattress (bed type)    Nutrition Interventions: (TPN)    Friction and Shear Interventions: Lift sheet

## 2017-07-11 LAB — GLUCOSE, POC
Glucose (POC): 204 mg/dL — ABNORMAL HIGH (ref 65–100)
Glucose (POC): 254 mg/dL — ABNORMAL HIGH (ref 65–100)
Glucose (POC): 150 mg/dL — ABNORMAL HIGH (ref 65–100)
Glucose (POC): 137 mg/dL — ABNORMAL HIGH (ref 65–100)
Glucose (POC): 139 mg/dL — ABNORMAL HIGH (ref 65–100)

## 2017-07-11 LAB — METABOLIC PANEL, COMPREHENSIVE
A-G Ratio: 0.4 — ABNORMAL LOW (ref 1.1–2.2)
ALT (SGPT): 15 U/L (ref 12–78)
AST (SGOT): 9 U/L — ABNORMAL LOW (ref 15–37)
Albumin: 1.7 g/dL — ABNORMAL LOW (ref 3.5–5.0)
Alk. phosphatase: 47 U/L (ref 45–117)
Anion gap: 9 mmol/L (ref 5–15)
BUN/Creatinine ratio: 32 — ABNORMAL HIGH (ref 12–20)
BUN: 15 MG/DL (ref 6–20)
Bilirubin, total: 0.4 MG/DL (ref 0.2–1.0)
CO2: 28 mmol/L (ref 21–32)
Calcium: 8.2 MG/DL — ABNORMAL LOW (ref 8.5–10.1)
Chloride: 104 mmol/L (ref 97–108)
Creatinine: 0.47 MG/DL — ABNORMAL LOW (ref 0.55–1.02)
GFR est AA: >60 mL/min/{1.73_m2} (ref >60)
GFR est non-AA: >60 mL/min/{1.73_m2} (ref >60)
Globulin: 4.2 g/dL — ABNORMAL HIGH (ref 2.0–4.0)
Glucose: 175 mg/dL — ABNORMAL HIGH (ref 65–100)
Potassium: 4.1 mmol/L (ref 3.5–5.1)
Protein, total: 5.9 g/dL — ABNORMAL LOW (ref 6.4–8.2)
Sodium: 141 mmol/L (ref 136–145)

## 2017-07-11 LAB — CBC WITH AUTOMATED DIFF
ABS. BASOPHILS: 0 10*3/uL (ref 0.0–0.1)
ABS. EOSINOPHILS: 0 10*3/uL (ref 0.0–0.4)
ABS. IMM. GRANS.: 0 10*3/uL
ABS. LYMPHOCYTES: 1.6 10*3/uL (ref 0.8–3.5)
ABS. MONOCYTES: 0.2 10*3/uL (ref 0.0–1.0)
ABS. NEUTROPHILS: 5.3 10*3/uL (ref 1.8–8.0)
ABSOLUTE NRBC: 0.02 10*3/uL — ABNORMAL HIGH (ref 0.00–0.01)
BAND NEUTROPHILS: 1 % (ref 0–6)
BASOPHILS: 0 % (ref 0–1)
EOSINOPHILS: 0 % (ref 0–7)
HCT: 32.2 % — ABNORMAL LOW (ref 35.0–47.0)
HGB: 9.9 g/dL — ABNORMAL LOW (ref 11.5–16.0)
IMMATURE GRANULOCYTES: 0 %
LYMPHOCYTES: 22 % (ref 12–49)
MCH: 28.9 PG (ref 26.0–34.0)
MCHC: 30.7 g/dL (ref 30.0–36.5)
MCV: 94.2 FL (ref 80.0–99.0)
METAMYELOCYTES: 2 % — ABNORMAL HIGH
MONOCYTES: 3 % — ABNORMAL LOW (ref 5–13)
MPV: 9.9 FL (ref 8.9–12.9)
NEUTROPHILS: 72 % (ref 32–75)
NRBC: 0.3 PER 100 WBC — ABNORMAL HIGH
PLATELET: 251 10*3/uL (ref 150–400)
RBC: 3.42 M/uL — ABNORMAL LOW (ref 3.80–5.20)
RDW: 15.4 % — ABNORMAL HIGH (ref 11.5–14.5)
WBC: 7.2 10*3/uL (ref 3.6–11.0)

## 2017-07-11 LAB — MAGNESIUM: Magnesium: 2 mg/dL (ref 1.6–2.4)

## 2017-07-11 LAB — PHOSPHORUS: Phosphorus: 4.4 MG/DL (ref 2.6–4.7)

## 2017-07-11 MED ORDER — POTASSIUM ACETATE 2 MEQ/ML IV
2 mEq/mL | INTRAVENOUS | Status: AC
Start: 2017-07-11 — End: 2017-07-12
  Administered 2017-07-11 – 2017-07-12 (×2): via INTRAVENOUS

## 2017-07-11 MED ORDER — INSULIN NPH HUMAN RECOMB 100 UNIT/ML INJECTION
100 unit/mL | Freq: Every evening | SUBCUTANEOUS | Status: DC
Start: 2017-07-11 — End: 2017-07-12
  Administered 2017-07-12: 03:00:00 via SUBCUTANEOUS

## 2017-07-11 MED FILL — NORMAL SALINE FLUSH 0.9 % INJECTION SYRINGE: INTRAMUSCULAR | Qty: 10

## 2017-07-11 MED FILL — INSULIN LISPRO 100 UNIT/ML INJECTION: 100 unit/mL | SUBCUTANEOUS | Qty: 1

## 2017-07-11 MED FILL — PROTONIX 40 MG INTRAVENOUS SOLUTION: 40 mg | INTRAVENOUS | Qty: 40

## 2017-07-11 MED FILL — ENOXAPARIN 40 MG/0.4 ML SUB-Q SYRINGE: 40 mg/0.4 mL | SUBCUTANEOUS | Qty: 0.4

## 2017-07-11 MED FILL — SERTRALINE 50 MG TAB: 50 mg | ORAL | Qty: 2

## 2017-07-11 MED FILL — ACETAMINOPHEN 325 MG TABLET: 325 mg | ORAL | Qty: 2

## 2017-07-11 MED FILL — NORMAL SALINE FLUSH 0.9 % INJECTION SYRINGE: INTRAMUSCULAR | Qty: 20

## 2017-07-11 MED FILL — INTRALIPID 20 % INTRAVENOUS EMULSION: 20 % | INTRAVENOUS | Qty: 500

## 2017-07-11 MED FILL — INSULIN NPH HUMAN RECOMB 100 UNIT/ML INJECTION: 100 unit/mL | SUBCUTANEOUS | Qty: 1

## 2017-07-11 MED FILL — AMINOSYN II 10 % IV: 10 % | INTRAVENOUS | Qty: 1000

## 2017-07-11 MED FILL — PIPERACILLIN-TAZOBACTAM 3.375 GRAM IV SOLR: 3.375 gram | INTRAVENOUS | Qty: 3.38

## 2017-07-11 MED FILL — HYDROMORPHONE 2 MG/ML INJECTION SOLUTION: 2 mg/mL | INTRAMUSCULAR | Qty: 1

## 2017-07-11 MED FILL — SIMVASTATIN 20 MG TAB: 20 mg | ORAL | Qty: 2

## 2017-07-11 NOTE — Progress Notes (Signed)
Bedside and Verbal shift change report given to Kirsten (oncoming nurse) by Kaitlyn (offgoing nurse). Report included the following information SBAR, MAR and Recent Results.

## 2017-07-11 NOTE — Progress Notes (Signed)
Problem: Falls - Risk of  Goal: *Absence of Falls  Document Schmid Fall Risk and appropriate interventions in the flowsheet.  Outcome: Progressing Towards Goal  Fall Risk Interventions:  Mobility Interventions: Communicate number of staff needed for ambulation/transfer         Medication Interventions: Teach patient to arise slowly    Elimination Interventions: Call light in reach

## 2017-07-11 NOTE — Progress Notes (Signed)
Medical Progress Note      NAME: Dana [name]   DOB:  08-14-1956  MRM:  [id number]    Date/Time: 07/11/2017  8:14 AM    Problem List:   Principal Problem:    Abdominal pain (07/02/2017)    Active Problems:    HTN (hypertension) ()      Diabetes (HCC) (01/24/2013)      Acute gastric ulcer with perforation (HCC) (05/13/2017)           Subjective:     Patient feeling better.    Past Medical History:   Diagnosis Date   ??? Diabetes (HCC) 01/24/2013   ??? GERD (gastroesophageal reflux disease)    ??? Hives    ??? HTN (hypertension)    ??? Hypercholesterolemia    ??? Perforated gastric ulcer (HCC)        ROS:  General: negative for fever, chills, sweats, weakness  Respiratory:  negative for cough, sputum production, SOB, wheezing, DOE, pleuritic pain  Cardiology:  negative for chest pain, palpitations, orthopnea, PND, edema, syncope   Gastrointestinal: positive for abd pain         Objective:       Vitals:          Last 24hrs VS reviewed since prior progress note. Most recent are:    Visit Vitals  BP 135/79 (BP 1 Location: Right arm, BP Patient Position: At rest)   Pulse 64   Temp 98.1 ??F (36.7 ??C)   Resp 18   Ht 5\' 4"  (1.626 m)   Wt 255 lb (115.7 kg)   SpO2 94%   BMI 43.77 kg/m??     SpO2 Readings from Last 6 Encounters:   07/11/17 94%   06/09/17 98%   05/23/17 98%   05/18/17 93%   05/20/15 94%   11/30/10 100%    O2 Flow Rate (L/min): 2 l/min       Intake/Output Summary (Last 24 hours) at 07/11/2017 0814  Last data filed at 07/10/2017 1804  Gross per 24 hour   Intake 187 ml   Output ???   Net 187 ml          Exam:     General   Obese 61 yo wf  Respiratory   Clear To Auscultation bilaterally - no wheezes, rales, rhonchi, or crackles  Cardiology  Regular Rate and Rythmn  - no murmurs, rubs or gallops  Abdominal  Soft, non-tender, non-distended, positive bowel sounds, no hepatosplenomegaly  Extremities  No clubbing, cyanosis, or edema. Pulses intact.    Lab Data Reviewed: (see below)    Medications Reviewed: (see below)     ______________________________________________________________________    Medications:     Current Facility-Administered Medications   Medication Dose Route Frequency   ??? sodium chloride (NS) flush 20 mL  20 mL InterCATHeter PRN   ??? sodium chloride (NS) flush 10 mL  10 mL InterCATHeter Q24H   ??? sodium chloride (NS) flush 10 mL  10 mL InterCATHeter PRN   ??? sodium chloride (NS) flush 10 mL  10 mL InterCATHeter Q8H   ??? alteplase (CATHFLO) 1 mg in sterile water (preservative free) 1 mL injection  1 mg InterCATHeter PRN   ??? bacitracin 500 unit/gram packet 1 Packet  1 Packet Topical PRN   ??? fat emulsion 20% (LIPOSYN, INTRAlipid) infusion 500 mL  500 mL IntraVENous Q MON, WED & FRI   ??? HYDROmorphone (DILAUDID) injection 2 mg  2 mg IntraVENous Q3H PRN   ??? enoxaparin (LOVENOX)  injection 40 mg  40 mg SubCUTAneous DAILY   ??? pantoprazole (PROTONIX) 40 mg in sodium chloride 0.9% 10 mL injection  40 mg IntraVENous Q12H   ??? piperacillin-tazobactam (ZOSYN) 3.375 g in 0.9% sodium chloride (MBP/ADV) 100 mL  3.375 g IntraVENous Q8H   ??? simvastatin (ZOCOR) tablet 40 mg  40 mg Oral QHS   ??? sertraline (ZOLOFT) tablet 100 mg  100 mg Oral DAILY   ??? sodium chloride (NS) flush 5-10 mL  5-10 mL IntraVENous Q8H   ??? sodium chloride (NS) flush 5-10 mL  5-10 mL IntraVENous PRN   ??? acetaminophen (TYLENOL) tablet 650 mg  650 mg Oral Q4H PRN   ??? ondansetron (ZOFRAN) injection 4 mg  4 mg IntraVENous Q4H PRN   ??? hydrALAZINE (APRESOLINE) 20 mg/mL injection 20 mg  20 mg IntraVENous Q6H PRN   ??? insulin lispro (HUMALOG) injection   SubCUTAneous AC&HS   ??? glucose chewable tablet 16 g  4 Tab Oral PRN   ??? dextrose (D50W) injection syrg 12.5-25 g  12.5-25 g IntraVENous PRN   ??? glucagon (GLUCAGEN) injection 1 mg  1 mg IntraMUSCular PRN   ??? cloNIDine HCl (CATAPRES) tablet 0.1 mg  0.1 mg Oral Q4H PRN   ??? insulin NPH (NOVOLIN N, HUMULIN N) injection 20 Units  20 Units SubCUTAneous QHS            Lab Review:     Recent Labs     07/11/17  0509 07/10/17   0015 07/09/17  0213   WBC 7.2 7.9 7.2   HGB 9.9* 9.7* 10.2*   HCT 32.2* 31.7* 32.8*   PLT 251 221 243     Recent Labs     07/11/17  0509 07/10/17  0015 07/09/17  0213   NA 141 139 138   K 4.1 4.2 4.0   CL 104 102 102   CO2 28 28 28    GLU 175* 162* 254*   BUN 15 14 12    CREA 0.47* 0.46* 0.46*   CA 8.2* 8.2* 8.1*   MG 2.0 1.7 1.7   PHOS 4.4 4.9* 4.7   ALB 1.7*  --   --    TBILI 0.4  --   --    SGOT 9*  --   --    ALT 15  --   --      Lab Results   Component Value Date/Time    Glucose (POC) 254 (H) 07/11/2017 06:32 AM    Glucose (POC) 204 (H) 07/10/2017 08:38 PM    Glucose (POC) 138 (H) 07/10/2017 04:30 PM    Glucose (POC) 129 (H) 07/10/2017 11:19 AM    Glucose (POC) 157 (H) 07/10/2017 07:33 AM     No results for input(s): PH, PCO2, PO2, HCO3, FIO2 in the last 72 hours.  No results for input(s): INR in the last 72 hours.    No lab exists for component: INREXT    Other pertinent lab: NA         Assessment:     Patient Active Problem List   Diagnosis Code   ??? Hypercholesterolemia E78.00   ??? HTN (hypertension) I10   ??? Diabetes (HCC) E11.9   ??? Sleep apnea G47.30   ??? Hypertension complicating diabetes (HCC) E11.59, I10   ??? Morbid obesity due to excess calories (HCC) E66.01   ??? Hives L50.9   ??? Acute gastric ulcer with perforation (HCC) K25.1   ??? Arthralgia of both knees M25.561, M25.562   ???  Abdominal pain R10.9          Plan:                 1. Bowel perf- s/p drainage by IR  2. Nutrition- tpn  3. Diabetes- insulin in bag and ssi.                ___________________________________________________    Attending Physician: Lake Bells, MD

## 2017-07-11 NOTE — Progress Notes (Signed)
Dana [name] is feeling better today. No specific complaints.  Tm 98.3 HR: 64 BP: 135/79 Resp Rate: 18 94% sat on room air.    Intake/Output Summary (Last 24 hours) at 07/11/2017 0855  Last data filed at 07/10/2017 1804  Gross per 24 hour   Intake 187 ml   Output ???   Net 187 ml   Exam: Cor: RRR.              Lungs: Bilateral breath sounds. Clear to auscultation.              Abd: Soft. Non distended.            Tender at drain site.           Skin at drain site is excoriated and indurated.           Purulent appearing drainage.  Labs:   Recent Results (from the past 12 hour(s))   CBC WITH AUTOMATED DIFF    Collection Time: 07/11/17  5:09 AM   Result Value Ref Range    WBC 7.2 3.6 - 11.0 K/uL    RBC 3.42 (L) 3.80 - 5.20 M/uL    HGB 9.9 (L) 11.5 - 16.0 g/dL    HCT 32.2 (L) 35.0 - 47.0 %    MCV 94.2 80.0 - 99.0 FL    MCH 28.9 26.0 - 34.0 PG    MCHC 30.7 30.0 - 36.5 g/dL    RDW 15.4 (H) 11.5 - 14.5 %    PLATELET 251 150 - 400 K/uL    MPV 9.9 8.9 - 12.9 FL    NRBC 0.3 (H) 0 PER 100 WBC    ABSOLUTE NRBC 0.02 (H) 0.00 - 0.01 K/uL    NEUTROPHILS 72 32 - 75 %    BAND NEUTROPHILS 1 0 - 6 %    LYMPHOCYTES 22 12 - 49 %    MONOCYTES 3 (L) 5 - 13 %    EOSINOPHILS 0 0 - 7 %    BASOPHILS 0 0 - 1 %    METAMYELOCYTES 2 (H) 0 %    IMMATURE GRANULOCYTES 0 %    ABS. NEUTROPHILS 5.3 1.8 - 8.0 K/UL    ABS. LYMPHOCYTES 1.6 0.8 - 3.5 K/UL    ABS. MONOCYTES 0.2 0.0 - 1.0 K/UL    ABS. EOSINOPHILS 0.0 0.0 - 0.4 K/UL    ABS. BASOPHILS 0.0 0.0 - 0.1 K/UL    ABS. IMM. GRANS. 0.0 K/UL    DF MANUAL      PLATELET COMMENTS Large Platelets      RBC COMMENTS ANISOCYTOSIS  1+        RBC COMMENTS POLYCHROMASIA  1+       MAGNESIUM    Collection Time: 07/11/17  5:09 AM   Result Value Ref Range    Magnesium 2.0 1.6 - 2.4 mg/dL   PHOSPHORUS    Collection Time: 07/11/17  5:09 AM   Result Value Ref Range    Phosphorus 4.4 2.6 - 4.7 MG/DL   METABOLIC PANEL, COMPREHENSIVE    Collection Time: 07/11/17  5:09 AM   Result Value Ref Range    Sodium 141 136 - 145 mmol/L     Potassium 4.1 3.5 - 5.1 mmol/L    Chloride 104 97 - 108 mmol/L    CO2 28 21 - 32 mmol/L    Anion gap 9 5 - 15 mmol/L    Glucose 175 (H) 65 - 100 mg/dL    BUN  15 6 - 20 MG/DL    Creatinine 0.47 (L) 0.55 - 1.02 MG/DL    BUN/Creatinine ratio 32 (H) 12 - 20      GFR est AA >60 >60 ml/min/1.56m    GFR est non-AA >60 >60 ml/min/1.736m   Calcium 8.2 (L) 8.5 - 10.1 MG/DL    Bilirubin, total 0.4 0.2 - 1.0 MG/DL    ALT (SGPT) 15 12 - 78 U/L    AST (SGOT) 9 (L) 15 - 37 U/L    Alk. phosphatase 47 45 - 117 U/L    Protein, total 5.9 (L) 6.4 - 8.2 g/dL    Albumin 1.7 (L) 3.5 - 5.0 g/dL    Globulin 4.2 (H) 2.0 - 4.0 g/dL    A-G Ratio 0.4 (L) 1.1 - 2.2     GLUCOSE, POC    Collection Time: 07/11/17  6:32 AM   Result Value Ref Range    Glucose (POC) 254 (H) 65 - 100 mg/dL    [facility]    GLUCOSE, POC    Collection Time: 07/11/17  8:23 AM   Result Value Ref Range    Glucose (POC) 150 (H) 65 - 100 mg/dL    Performed by LAJunious SilkIRSTEN    NPO except ice chips for now.   Renew cyclic TPN/Lipids.   Pain medication and anti-emetics as needed   DVT prophylaxis.   IV abx - Zosyn as ordered.  Continue BID Protonix.  Will check gastrograffin upper GI 07/12/2017.  Plans per Dr. PaFanny Skates

## 2017-07-11 NOTE — Progress Notes (Signed)
Bedside and Verbal shift change report given to Shirly (oncoming nurse) by Kirsten (offgoing nurse). Report included the following information SBAR.

## 2017-07-11 NOTE — Progress Notes (Signed)
Problem: Falls - Risk of  Goal: *Absence of Falls  Document Schmid Fall Risk and appropriate interventions in the flowsheet.  Outcome: Progressing Towards Goal  Fall Risk Interventions:  Mobility Interventions: Communicate number of staff needed for ambulation/transfer         Medication Interventions: Teach patient to arise slowly    Elimination Interventions: Call light in reach             Problem: Pressure Injury - Risk of  Goal: *Prevention of pressure injury  Document Braden Scale and appropriate interventions in the flowsheet.  Outcome: Progressing Towards Goal  Pressure Injury Interventions:  Sensory Interventions: Float heels, Keep linens dry and wrinkle-free    Moisture Interventions: Absorbent underpads, Apply protective barrier, creams and emollients    Activity Interventions: Increase time out of bed    Mobility Interventions: Float heels    Nutrition Interventions: (TPN)    Friction and Shear Interventions: Lift sheet    Needs encouragement to turn and reposition

## 2017-07-12 ENCOUNTER — Inpatient Hospital Stay: Admit: 2017-07-12 | Payer: BLUE CROSS/BLUE SHIELD | Primary: Internal Medicine

## 2017-07-12 LAB — CBC WITH AUTOMATED DIFF
ABS. BASOPHILS: 0 10*3/uL (ref 0.0–0.1)
ABS. EOSINOPHILS: 0 10*3/uL (ref 0.0–0.4)
ABS. IMM. GRANS.: 0 10*3/uL
ABS. LYMPHOCYTES: 2 10*3/uL (ref 0.8–3.5)
ABS. MONOCYTES: 0.2 10*3/uL (ref 0.0–1.0)
ABS. NEUTROPHILS: 5.6 10*3/uL (ref 1.8–8.0)
ABSOLUTE NRBC: 0 10*3/uL (ref 0.00–0.01)
BAND NEUTROPHILS: 2 % (ref 0–6)
BASOPHILS: 0 % (ref 0–1)
EOSINOPHILS: 0 % (ref 0–7)
HCT: 34.1 % — ABNORMAL LOW (ref 35.0–47.0)
HGB: 10.6 g/dL — ABNORMAL LOW (ref 11.5–16.0)
IMMATURE GRANULOCYTES: 0 %
LYMPHOCYTES: 26 % (ref 12–49)
MCH: 29.1 PG (ref 26.0–34.0)
MCHC: 31.1 g/dL (ref 30.0–36.5)
MCV: 93.7 FL (ref 80.0–99.0)
MONOCYTES: 2 % — ABNORMAL LOW (ref 5–13)
MPV: 10.1 FL (ref 8.9–12.9)
NEUTROPHILS: 70 % (ref 32–75)
NRBC: 0 PER 100 WBC
PLATELET: 298 10*3/uL (ref 150–400)
RBC: 3.64 M/uL — ABNORMAL LOW (ref 3.80–5.20)
RDW: 15.3 % — ABNORMAL HIGH (ref 11.5–14.5)
WBC: 7.8 10*3/uL (ref 3.6–11.0)

## 2017-07-12 LAB — GLUCOSE, POC
Glucose (POC): 314 mg/dL — ABNORMAL HIGH (ref 65–100)
Glucose (POC): 245 mg/dL — ABNORMAL HIGH (ref 65–100)
Glucose (POC): 234 mg/dL — ABNORMAL HIGH (ref 65–100)
Glucose (POC): 184 mg/dL — ABNORMAL HIGH (ref 65–100)

## 2017-07-12 LAB — PHOSPHORUS: Phosphorus: 3.7 MG/DL (ref 2.6–4.7)

## 2017-07-12 LAB — MAGNESIUM: Magnesium: 1.9 mg/dL (ref 1.6–2.4)

## 2017-07-12 MED ORDER — LOPERAMIDE 2 MG CAP
2 mg | Freq: Once | ORAL | Status: AC
Start: 2017-07-12 — End: 2017-07-12
  Administered 2017-07-12: 22:00:00 via ORAL

## 2017-07-12 MED ORDER — SODIUM CHLORIDE 4 MEQ/ML IV
4 mEq/mL | INTRAVENOUS | Status: DC
Start: 2017-07-12 — End: 2017-07-13
  Administered 2017-07-13 (×2): via INTRAVENOUS

## 2017-07-12 MED ORDER — IOHEXOL 350 MG IODINE/ML INTRAVENOUS SOLUTION
350 mg iodine/mL | Freq: Once | INTRAVENOUS | Status: AC
Start: 2017-07-12 — End: 2017-07-12
  Administered 2017-07-12: 13:00:00 via ORAL

## 2017-07-12 MED ORDER — ZOLPIDEM 5 MG TAB
5 mg | Freq: Every evening | ORAL | Status: DC
Start: 2017-07-12 — End: 2017-07-14
  Administered 2017-07-13 – 2017-07-14 (×2): via ORAL

## 2017-07-12 MED ORDER — IOHEXOL 350 MG IODINE/ML INTRAVENOUS SOLUTION
350 mg iodine/mL | Freq: Once | INTRAVENOUS | Status: AC
Start: 2017-07-12 — End: 2017-07-12

## 2017-07-12 MED ORDER — INSULIN NPH HUMAN RECOMB 100 UNIT/ML INJECTION
100 unit/mL | Freq: Every evening | SUBCUTANEOUS | Status: DC
Start: 2017-07-12 — End: 2017-07-14
  Administered 2017-07-13 (×2): via SUBCUTANEOUS

## 2017-07-12 MED FILL — INSULIN NPH HUMAN RECOMB 100 UNIT/ML INJECTION: 100 unit/mL | SUBCUTANEOUS | Qty: 1

## 2017-07-12 MED FILL — SERTRALINE 50 MG TAB: 50 mg | ORAL | Qty: 2

## 2017-07-12 MED FILL — PROTONIX 40 MG INTRAVENOUS SOLUTION: 40 mg | INTRAVENOUS | Qty: 40

## 2017-07-12 MED FILL — ACETAMINOPHEN 325 MG TABLET: 325 mg | ORAL | Qty: 2

## 2017-07-12 MED FILL — LOPERAMIDE 2 MG CAP: 2 mg | ORAL | Qty: 2

## 2017-07-12 MED FILL — OMNIPAQUE 350 MG IODINE/ML INTRAVENOUS SOLUTION: 350 mg iodine/mL | INTRAVENOUS | Qty: 100

## 2017-07-12 MED FILL — AMINOSYN II 10 % IV: 10 % | INTRAVENOUS | Qty: 1000

## 2017-07-12 MED FILL — INSULIN LISPRO 100 UNIT/ML INJECTION: 100 unit/mL | SUBCUTANEOUS | Qty: 1

## 2017-07-12 MED FILL — PIPERACILLIN-TAZOBACTAM 3.375 GRAM IV SOLR: 3.375 gram | INTRAVENOUS | Qty: 3.38

## 2017-07-12 MED FILL — NORMAL SALINE FLUSH 0.9 % INJECTION SYRINGE: INTRAMUSCULAR | Qty: 10

## 2017-07-12 MED FILL — SIMVASTATIN 20 MG TAB: 20 mg | ORAL | Qty: 2

## 2017-07-12 MED FILL — ENOXAPARIN 40 MG/0.4 ML SUB-Q SYRINGE: 40 mg/0.4 mL | SUBCUTANEOUS | Qty: 0.4

## 2017-07-12 MED FILL — OMNIPAQUE 350 MG IODINE/ML INTRAVENOUS SOLUTION: 350 mg iodine/mL | INTRAVENOUS | Qty: 50

## 2017-07-12 NOTE — Progress Notes (Signed)
Bedside and Verbal shift change report given to Shirly (oncoming nurse) by Popel (offgoing nurse). Report included the following information SBAR.

## 2017-07-12 NOTE — Other (Signed)
DTC Progress Note    Recommendations/ Comments: Chart reviewed on Dana [name] for hyperglycemia, most BG's > 200 mg/dL  In spite of increasing insulin in TPN and NPH at bedtime, BG's 314 mg/dL at bedtime last night and 235 mg/dL this AM.   BG's during the day range 130 - 150 mg/dL   Pt admitted d/t RUQ abdominal pain workup.   Pt was on steroids -these have been discontinued.  Pt remains NPO and on TPN with 12 units/L, total 24 units in current bag and in the next bag. TPN cyclic 7PM - 7AM   Pt received 9 units of correction insulin in the past 24 hours.    If appropriate, please consider:  Continue to titrate either NPH or insulin in TPN to achieve BG's < 180 mg/dL  Suggest moving timing of NPH to 6 PM to coincide with the onset of TPN starting at [facility]7PM      Current hospital DM medication:  NPH, 24 units HS   Lispro, normal scale - 9 units of correction received x 24 hours  TPN with insuiln 12 units/L, total 24    Patient is a 61 y.o. female with known history of DM on NPH and Metformin at home.    A1c:   Lab Results   Component Value Date/Time    Hemoglobin A1c 7.3 (H) 05/13/2017 03:04 AM    Hemoglobin A1c 7.2 (H) 01/31/2015 03:52 PM       Recent Glucose Results:   Lab Results   Component Value Date/Time    GLUCPOC 245 (H) 07/12/2017 06:39 AM    GLUCPOC 314 (H) 07/11/2017 09:36 PM    GLUCPOC 139 (H) 07/11/2017 04:06 PM        Lab Results   Component Value Date/Time    Creatinine 0.47 (L) 07/11/2017 05:09 AM     Estimated Creatinine Clearance: 160.9 mL/min (A) (based on SCr of 0.47 mg/dL (L)).    Active Orders   Diet    DIET CLEAR LIQUID No Conc. Sweets        PO intake:   Patient Vitals for the past 72 hrs:   % Diet Eaten   07/10/17 1804 0 %   07/10/17 1135 0 %       Will continue to follow as needed.    Thank you,  Higinio PlanSallie P. Fredy Gladu RN, CDE  [facility]  Pager: [phone]

## 2017-07-12 NOTE — Procedures (Signed)
PICC Placement Note    PRE-PROCEDURE VERIFICATION  Correct Procedure: yes  Correct Site:  yes  Temperature: Temp: 98.5 ??F (36.9 ??C), Temperature Source: Temp Source: Oral  Recent Labs     07/12/17  0412 07/11/17  0509   BUN  --  15   CREA  --  0.47*   PLT 298 251   WBC 7.8 7.2     Allergies: Adhesive and Dolobid [diflunisal]  Education materials, including PICC Booklet, for PICC Care given to patient: yes.   See Patient Education activity for further details.    PROCEDURE DETAIL  A double lumen PICC line was started for TPN. The following documentation is in addition to the PICC properties in the lines/airways flowsheet :  Lot #: [id number]  Was xylocaine 1% used intradermally:  no  Catheter Length: 45 (cm)  Vein Selection for PICC:left basilic  Central Line Bundle followed yes  Complication Related to Insertion: none    The placement was verified by ECG/Sapiens technology:  The  tip location is on the left side and the tip is in the  superior vena cava. See ECG results for PICC tip placement.  Removed left PICC.Transparent dressing applied.  Report given to nurse    Line is okay to use.    Mari Fahrbach, RN

## 2017-07-12 NOTE — Progress Notes (Signed)
Ms. Nedra HaiLee reports chills this AM.  Tm 99 HR: 65 BP: 137/90 Resp Rate: 18 96% sat on room air.    Intake/Output Summary (Last 24 hours) at 07/12/2017 0926  Last data filed at 07/11/2017 1435  Gross per 24 hour   Intake 2404 ml   Output ???   Net 2404 ml   Exam: Cor: RRR.              Lungs: Bilateral breath sounds. Clear to auscultation.              Abd: Soft. Non distended.            Less tender at drain site.           Drain output does not appear enteric.  Labs:   Recent Results (from the past 12 hour(s))   GLUCOSE, POC    Collection Time: 07/11/17  9:36 PM   Result Value Ref Range    Glucose (POC) 314 (H) 65 - 100 mg/dL    Performed by SON AIMINH    CBC WITH AUTOMATED DIFF    Collection Time: 07/12/17  4:12 AM   Result Value Ref Range    WBC 7.8 3.6 - 11.0 K/uL    RBC 3.64 (L) 3.80 - 5.20 M/uL    HGB 10.6 (L) 11.5 - 16.0 g/dL    HCT 09.834.1 (L) 11.935.0 - 47.0 %    MCV 93.7 80.0 - 99.0 FL    MCH 29.1 26.0 - 34.0 PG    MCHC 31.1 30.0 - 36.5 g/dL    RDW 14.715.3 (H) 82.911.5 - 14.5 %    PLATELET 298 150 - 400 K/uL    MPV 10.1 8.9 - 12.9 FL    NRBC 0.0 0 PER 100 WBC    ABSOLUTE NRBC 0.00 0.00 - 0.01 K/uL    NEUTROPHILS 70 32 - 75 %    BAND NEUTROPHILS 2 0 - 6 %    LYMPHOCYTES 26 12 - 49 %    MONOCYTES 2 (L) 5 - 13 %    EOSINOPHILS 0 0 - 7 %    BASOPHILS 0 0 - 1 %    IMMATURE GRANULOCYTES 0 %    ABS. NEUTROPHILS 5.6 1.8 - 8.0 K/UL    ABS. LYMPHOCYTES 2.0 0.8 - 3.5 K/UL    ABS. MONOCYTES 0.2 0.0 - 1.0 K/UL    ABS. EOSINOPHILS 0.0 0.0 - 0.4 K/UL    ABS. BASOPHILS 0.0 0.0 - 0.1 K/UL    ABS. IMM. GRANS. 0.0 K/UL    DF MANUAL      PLATELET COMMENTS Large Platelets      RBC COMMENTS ANISOCYTOSIS  1+        RBC COMMENTS POLYCHROMASIA  1+       MAGNESIUM    Collection Time: 07/12/17  4:12 AM   Result Value Ref Range    Magnesium 1.9 1.6 - 2.4 mg/dL   PHOSPHORUS    Collection Time: 07/12/17  4:12 AM   Result Value Ref Range    Phosphorus 3.7 2.6 - 4.7 MG/DL   GLUCOSE, POC    Collection Time: 07/12/17  6:39 AM   Result Value Ref Range     Glucose (POC) 245 (H) 65 - 100 mg/dL    Performed by Cherlyn LabellaGOLDEN SAVANNAH    Reviewed Upper GI - no apparent leak.   Will try clear liquid diet.   Continue cyclic TPN/Lipids for now.   IV abx - Zosyn.  BID Protonix as ordered.   OOB, Ambulate.   DVT Prophylaxis - Lovenox.  Plans per Dr. Cherly HensenPahle.

## 2017-07-12 NOTE — Progress Notes (Signed)
Medical Progress Note      NAME: Dana [name]   DOB:  1956/06/26  MRM:  [id number]    Date/Time: 07/12/2017  7:25 AM    Problem List:   Principal Problem:    Abdominal pain (07/02/2017)    Active Problems:    HTN (hypertension) ()      Diabetes (HCC) (01/24/2013)      Acute gastric ulcer with perforation (HCC) (05/13/2017)           Subjective:     Patient having less pain- using tylenol. For ugi this am    Past Medical History:   Diagnosis Date   ??? Diabetes (HCC) 01/24/2013   ??? GERD (gastroesophageal reflux disease)    ??? Hives    ??? HTN (hypertension)    ??? Hypercholesterolemia    ??? Perforated gastric ulcer (HCC)        ROS:  General: postive for chills  Respiratory:  negative for cough, sputum production, SOB, wheezing, DOE, pleuritic pain  Cardiology:  negative for chest pain, palpitations, orthopnea, PND, edema, syncope   Gastrointestinal: negative for abdominal pain, N/V, dysphagia, change in bowel habits, bleeding         Objective:       Vitals:          Last 24hrs VS reviewed since prior progress note. Most recent are:    Visit Vitals  BP 143/69 (BP 1 Location: Left arm, BP Patient Position: At rest)   Pulse 72   Temp 98.2 ??F (36.8 ??C)   Resp 18   Ht 5\' 4"  (1.626 m)   Wt 255 lb (115.7 kg)   SpO2 95%   BMI 43.77 kg/m??     SpO2 Readings from Last 6 Encounters:   07/12/17 95%   06/09/17 98%   05/23/17 98%   05/18/17 93%   05/20/15 94%   11/30/10 100%    O2 Flow Rate (L/min): 2 l/min       Intake/Output Summary (Last 24 hours) at 07/12/2017 0725  Last data filed at 07/11/2017 1435  Gross per 24 hour   Intake 2404 ml   Output ???   Net 2404 ml          Exam:     General   well developed, well nourished, appears stated age, in no acute distress  Respiratory   Clear To Auscultation bilaterally - no wheezes, rales, rhonchi, or crackles  Cardiology  regular  Abdominal  Soft, non-tender, non-distended, positive bowel sounds, no hepatosplenomegaly  Extremities  No clubbing, cyanosis, or edema. Pulses intact.     Lab Data Reviewed: (see below)    Medications Reviewed: (see below)    ______________________________________________________________________    Medications:     Current Facility-Administered Medications   Medication Dose Route Frequency   ??? iohexol (OMNIPAQUE) 350 mg iodine/mL contrast injection 100 mL  100 mL Oral RAD ONCE   ??? insulin NPH (NOVOLIN N, HUMULIN N) injection 24 Units  24 Units SubCUTAneous QHS   ??? sodium chloride (NS) flush 20 mL  20 mL InterCATHeter PRN   ??? sodium chloride (NS) flush 10 mL  10 mL InterCATHeter Q24H   ??? sodium chloride (NS) flush 10 mL  10 mL InterCATHeter PRN   ??? sodium chloride (NS) flush 10 mL  10 mL InterCATHeter Q8H   ??? alteplase (CATHFLO) 1 mg in sterile water (preservative free) 1 mL injection  1 mg InterCATHeter PRN   ??? bacitracin 500 unit/gram packet 1 Packet  1 Packet Topical PRN   ??? fat emulsion 20% (LIPOSYN, INTRAlipid) infusion 500 mL  500 mL IntraVENous Q MON, WED & FRI   ??? HYDROmorphone (DILAUDID) injection 2 mg  2 mg IntraVENous Q3H PRN   ??? enoxaparin (LOVENOX) injection 40 mg  40 mg SubCUTAneous DAILY   ??? pantoprazole (PROTONIX) 40 mg in sodium chloride 0.9% 10 mL injection  40 mg IntraVENous Q12H   ??? piperacillin-tazobactam (ZOSYN) 3.375 g in 0.9% sodium chloride (MBP/ADV) 100 mL  3.375 g IntraVENous Q8H   ??? simvastatin (ZOCOR) tablet 40 mg  40 mg Oral QHS   ??? sertraline (ZOLOFT) tablet 100 mg  100 mg Oral DAILY   ??? sodium chloride (NS) flush 5-10 mL  5-10 mL IntraVENous Q8H   ??? sodium chloride (NS) flush 5-10 mL  5-10 mL IntraVENous PRN   ??? acetaminophen (TYLENOL) tablet 650 mg  650 mg Oral Q4H PRN   ??? ondansetron (ZOFRAN) injection 4 mg  4 mg IntraVENous Q4H PRN   ??? hydrALAZINE (APRESOLINE) 20 mg/mL injection 20 mg  20 mg IntraVENous Q6H PRN   ??? insulin lispro (HUMALOG) injection   SubCUTAneous AC&HS   ??? glucose chewable tablet 16 g  4 Tab Oral PRN   ??? dextrose (D50W) injection syrg 12.5-25 g  12.5-25 g IntraVENous PRN    ??? glucagon (GLUCAGEN) injection 1 mg  1 mg IntraMUSCular PRN   ??? cloNIDine HCl (CATAPRES) tablet 0.1 mg  0.1 mg Oral Q4H PRN            Lab Review:     Recent Labs     07/12/17  0412 07/11/17  0509 07/10/17  0015   WBC 7.8 7.2 7.9   HGB 10.6* 9.9* 9.7*   HCT 34.1* 32.2* 31.7*   PLT 298 251 221     Recent Labs     07/12/17  0412 07/11/17  0509 07/10/17  0015   NA  --  141 139   K  --  4.1 4.2   CL  --  104 102   CO2  --  28 28   GLU  --  175* 162*   BUN  --  15 14   CREA  --  0.47* 0.46*   CA  --  8.2* 8.2*   MG 1.9 2.0 1.7   PHOS 3.7 4.4 4.9*   ALB  --  1.7*  --    TBILI  --  0.4  --    SGOT  --  9*  --    ALT  --  15  --      Lab Results   Component Value Date/Time    Glucose (POC) 245 (H) 07/12/2017 06:39 AM    Glucose (POC) 314 (H) 07/11/2017 09:36 PM    Glucose (POC) 139 (H) 07/11/2017 04:06 PM    Glucose (POC) 137 (H) 07/11/2017 11:13 AM    Glucose (POC) 150 (H) 07/11/2017 08:23 AM     No results for input(s): PH, PCO2, PO2, HCO3, FIO2 in the last 72 hours.  No results for input(s): INR in the last 72 hours.    No lab exists for component: INREXT    Other pertinent lab: NA         Assessment:     Patient Active Problem List   Diagnosis Code   ??? Hypercholesterolemia E78.00   ??? HTN (hypertension) I10   ??? Diabetes (HCC) E11.9   ??? Sleep apnea G47.30   ??? Hypertension complicating  diabetes (HCC) E11.59, I10   ??? Morbid obesity due to excess calories (HCC) E66.01   ??? Hives L50.9   ??? Acute gastric ulcer with perforation (HCC) K25.1   ??? Arthralgia of both knees M25.561, M25.562   ??? Abdominal pain R10.9          Plan:                 1. Bowel perf s/p drainage- for ugi today  2. Continue zosyn? Add flagyl?  3. Diabetes- continue to increase insulin  4. Dvt/gi prophylaxis                ___________________________________________________    Attending Physician: Lake Bells, MD

## 2017-07-12 NOTE — Progress Notes (Addendum)
At bedside to change PICC dressing. 11/3 noted on dressing; patient reports they just added a dressing, continued with change due to loose edges. Upon removal of dressing; PICC to be at the 5cm mark; xray ordered per protocol to confirm placement.     1550: Per xray result PICC is in the brachiocephalic vein. Primary RN aware. Dr. Noel Geroldohen returned paged and reports PICC should be changed. New order placed per verbal order. OK with TPN being stopped until able to be changed. RN aware.

## 2017-07-12 NOTE — Progress Notes (Signed)
Bedside shift change report given to Popel, RN (Cabin crewoncoming nurse) by Kennith Centerracey, RN (offgoing nurse). Report included the following information SBAR, Kardex and Recent Results.     1630 Called Dr. Noel Geroldohen from general surgery to inform about patients frequent diarrhea and difficulty sleeping at night.  One time dose of imodium ordered and Ambien qhs.

## 2017-07-12 NOTE — Procedures (Signed)
 PICC Placement Note    PRE-PROCEDURE VERIFICATION  Correct Procedure: yes  Correct Site:  yes  Temperature: Temp: 98.5 F (36.9 C), Temperature Source: Temp Source: Oral  Recent Labs     07/12/17  0412 07/11/17  0509   BUN  --  15   CREA  --  0.47*   PLT 298 251   WBC 7.8 7.2     Allergies: Adhesive and Dolobid [diflunisal]  Education materials, including PICC Booklet, for PICC Care given to patient: yes.   See Patient Education activity for further details.    PROCEDURE DETAIL  A double lumen PICC line was started for TPN. The following documentation is in addition to the PICC properties in the lines/airways flowsheet :  Lot #: [id number]  Was xylocaine 1% used intradermally:  no  Catheter Length: 45 (cm)  Vein Selection for PICC:left basilic  Central Line Bundle followed yes  Complication Related to Insertion: none    The placement was verified by ECG/Sapiens technology:  The  tip location is on the left side and the tip is in the  superior vena cava. See ECG results for PICC tip placement.  Removed left PICC.Transparent dressing applied.  Report given to nurse    Line is okay to use.    Iona Ladd, RN

## 2017-07-13 ENCOUNTER — Inpatient Hospital Stay: Admit: 2017-07-13 | Payer: BLUE CROSS/BLUE SHIELD | Primary: Internal Medicine

## 2017-07-13 LAB — CBC WITH AUTOMATED DIFF
ABS. BASOPHILS: 0 10*3/uL (ref 0.0–0.1)
ABS. EOSINOPHILS: 0 10*3/uL (ref 0.0–0.4)
ABS. IMM. GRANS.: 0.1 10*3/uL — ABNORMAL HIGH (ref 0.00–0.04)
ABS. LYMPHOCYTES: 1.9 10*3/uL (ref 0.8–3.5)
ABS. MONOCYTES: 0.6 10*3/uL (ref 0.0–1.0)
ABS. NEUTROPHILS: 5.3 10*3/uL (ref 1.8–8.0)
ABSOLUTE NRBC: 0 10*3/uL (ref 0.00–0.01)
BASOPHILS: 0 % (ref 0–1)
EOSINOPHILS: 1 % (ref 0–7)
HCT: 33.4 % — ABNORMAL LOW (ref 35.0–47.0)
HGB: 10.5 g/dL — ABNORMAL LOW (ref 11.5–16.0)
IMMATURE GRANULOCYTES: 2 % — ABNORMAL HIGH (ref 0.0–0.5)
LYMPHOCYTES: 24 % (ref 12–49)
MCH: 29.2 PG (ref 26.0–34.0)
MCHC: 31.4 g/dL (ref 30.0–36.5)
MCV: 93 FL (ref 80.0–99.0)
MONOCYTES: 8 % (ref 5–13)
MPV: 10 FL (ref 8.9–12.9)
NEUTROPHILS: 66 % (ref 32–75)
NRBC: 0 PER 100 WBC
PLATELET: 285 10*3/uL (ref 150–400)
RBC: 3.59 M/uL — ABNORMAL LOW (ref 3.80–5.20)
RDW: 15.6 % — ABNORMAL HIGH (ref 11.5–14.5)
WBC: 8 10*3/uL (ref 3.6–11.0)

## 2017-07-13 LAB — GLUCOSE, POC
Glucose (POC): 218 mg/dL — ABNORMAL HIGH (ref 65–100)
Glucose (POC): 295 mg/dL — ABNORMAL HIGH (ref 65–100)
Glucose (POC): 165 mg/dL — ABNORMAL HIGH (ref 65–100)
Glucose (POC): 134 mg/dL — ABNORMAL HIGH (ref 65–100)

## 2017-07-13 LAB — PHOSPHORUS: Phosphorus: 3.9 MG/DL (ref 2.6–4.7)

## 2017-07-13 LAB — MAGNESIUM: Magnesium: 2 mg/dL (ref 1.6–2.4)

## 2017-07-13 MED ORDER — IOHEXOL 240 MG/ML IV SOLN
240 mg iodine/mL | Freq: Once | INTRAVENOUS | Status: AC
Start: 2017-07-13 — End: 2017-07-13
  Administered 2017-07-13: 20:00:00 via ORAL

## 2017-07-13 MED ORDER — PANTOPRAZOLE 40 MG TAB, DELAYED RELEASE
40 mg | Freq: Two times a day (BID) | ORAL | Status: DC
Start: 2017-07-13 — End: 2017-07-14
  Administered 2017-07-14 (×2): via ORAL

## 2017-07-13 MED ORDER — SODIUM CHLORIDE 0.9% BOLUS IV
0.9 % | Freq: Once | INTRAVENOUS | Status: AC
Start: 2017-07-13 — End: 2017-07-13
  Administered 2017-07-13: 20:00:00 via INTRAVENOUS

## 2017-07-13 MED ORDER — IOPAMIDOL 76 % IV SOLN
370 mg iodine /mL (76 %) | Freq: Once | INTRAVENOUS | Status: AC
Start: 2017-07-13 — End: 2017-07-13
  Administered 2017-07-13: 20:00:00 via INTRAVENOUS

## 2017-07-13 MED ORDER — SODIUM CHLORIDE 0.9 % IJ SYRG
Freq: Once | INTRAMUSCULAR | Status: AC
Start: 2017-07-13 — End: 2017-07-13
  Administered 2017-07-13: 20:00:00 via INTRAVENOUS

## 2017-07-13 MED FILL — SIMVASTATIN 20 MG TAB: 20 mg | ORAL | Qty: 2

## 2017-07-13 MED FILL — ZOLPIDEM 5 MG TAB: 5 mg | ORAL | Qty: 1

## 2017-07-13 MED FILL — PROTONIX 40 MG INTRAVENOUS SOLUTION: 40 mg | INTRAVENOUS | Qty: 40

## 2017-07-13 MED FILL — PIPERACILLIN-TAZOBACTAM 3.375 GRAM IV SOLR: 3.375 gram | INTRAVENOUS | Qty: 3.38

## 2017-07-13 MED FILL — ACETAMINOPHEN 325 MG TABLET: 325 mg | ORAL | Qty: 2

## 2017-07-13 MED FILL — NORMAL SALINE FLUSH 0.9 % INJECTION SYRINGE: INTRAMUSCULAR | Qty: 10

## 2017-07-13 MED FILL — INSULIN NPH HUMAN RECOMB 100 UNIT/ML INJECTION: 100 unit/mL | SUBCUTANEOUS | Qty: 1

## 2017-07-13 MED FILL — INSULIN LISPRO 100 UNIT/ML INJECTION: 100 unit/mL | SUBCUTANEOUS | Qty: 1

## 2017-07-13 MED FILL — ISOVUE-370  76 % INTRAVENOUS SOLUTION: 370 mg iodine /mL (76 %) | INTRAVENOUS | Qty: 100

## 2017-07-13 MED FILL — SODIUM CHLORIDE 0.9 % IV: INTRAVENOUS | Qty: 100

## 2017-07-13 MED FILL — SERTRALINE 50 MG TAB: 50 mg | ORAL | Qty: 2

## 2017-07-13 MED FILL — OMNIPAQUE 240 MG IODINE/ML INTRAVENOUS SOLUTION: 240 mg iodine/mL | INTRAVENOUS | Qty: 50

## 2017-07-13 MED FILL — ENOXAPARIN 40 MG/0.4 ML SUB-Q SYRINGE: 40 mg/0.4 mL | SUBCUTANEOUS | Qty: 0.4

## 2017-07-13 NOTE — Progress Notes (Signed)
Problem: Falls - Risk of  Goal: *Absence of Falls  Document Schmid Fall Risk and appropriate interventions in the flowsheet.  Outcome: Progressing Towards Goal  Fall Risk Interventions:  Mobility Interventions: Communicate number of staff needed for ambulation/transfer         Medication Interventions: Patient to call before getting OOB    Elimination Interventions: Call light in reach

## 2017-07-13 NOTE — Progress Notes (Signed)
Bedside and Verbal shift change report given to Fritzi MandesKirsten (Cabin crewoncoming nurse) by Camelia Engerri (offgoing nurse). Report included the following information SBAR, MAR and Recent Results.

## 2017-07-13 NOTE — Progress Notes (Signed)
Bedside shift change report given to Barth Kirkseri, Charity fundraiserN (oncoming nurse) by Amador CunasShirly, RN (offgoing nurse). Report included the following information SBAR, Kardex and Recent Results.

## 2017-07-13 NOTE — Progress Notes (Signed)
NUTRITION COMPLETE ASSESSMENT    RECOMMENDATIONS:   1.  Consistent Carb Diet as tolerated  2.  Check weight daily   3. Check BG 2 hours after meals    4. Continue to monitor labs and adjust insulin     Interventions/Plan:   Food/Nutrient Delivery:          Modify rate, concentration, composition, and schedule, Initiate parenteral nutrition  - TPN no renewed today, pt to trial solid foods    Assessment:   Reason for Assessment:   [x]   Reassessment     Diet: Clear liquids   TPN:     Supplements: none  Nutritionally Significant Medications: [x]  Reviewed  Meal Intake:   Patient Vitals for the past 100 hrs:   % Diet Eaten   07/12/17 1727 50 %   07/10/17 1804 0 %   07/10/17 1135 0 %     Objective:  Pt admitted abd pain. PMHx: Acute gastric ulcer with perforation (05/13/17), DM, HTN, GERD. Pt followed by GI surgery. Pt is tolerating clears well. Chart reviewed. DW RN. TPN not reordered to tonight. Pt off unit for testing & expects to trial solid foods once back to the unit.     Estimated Nutrition Needs:   Kcals/day: 2050 Kcals/day  Protein:   109 gm (~2 gm/kg IBW)  Fluid: 2000 ml(~ 58m/kcal)   Based On: [place] Jeor(MSJ x 1.2)  Weight Used: Actual wt(108.1 kg)    Nutrition Diagnosis:   1. Altered GI function related to leak s/p previous ulcer repair as evidenced by NPO, start TPN; surgical repair perforated ulcer 05/2017 - resolving      Goals:     Meet 90% nutritional needs via TPN x 5-7 days.      Monitoring & Evaluation:    - Total energy intake, Protein intake, IV fluids   - Lean body mass, fat free mass, GI, Protein profile, Weight/weight change     Previous Nutrition Goals Met:  Progressing  Previous Recommendations:      Yes    Education & Discharge Needs:   [x]  None Identified   []  Identified and addressed    [x]  Participated in care plan, discharge planning, and/or interdisciplinary rounds        Cultural, religious and ethnic food preferences identified:   None     Skin Integrity: [] Intact  [x] Other (surgical incisions)  Edema: [x] None [] Other  Food Allergies: [x] None [] Other    Anthropometrics:    Weight Loss Metrics 07/13/2017 07/01/2017 06/09/2017 05/24/2017 05/23/2017 05/16/2017 03/17/2017   Today's Wt 235 lb 4.8 oz 232 lb 236 lb 232 lb 235 lb 254 lb 3.1 oz 250 lb   BMI 40.39 kg/m2 39.82 kg/m2 40.51 kg/m2 39.82 kg/m2 36.81 kg/m2 39.81 kg/m2 43.59 kg/m2      Last 3 Recorded Weights in this Encounter    07/11/17 0713 07/12/17 1118 07/13/17 1129   Weight: 115.7 kg (255 lb) 114.3 kg (252 lb) 106.7 kg (235 lb 4.8 oz)      Weight Source: Standing scale (comment)  Height: 5' 4"  (162.6 cm),    Body mass index is 40.39 kg/m??.  IBW : 54.4 kg (120 lb),    Usual Body Weight: 113.4 kg (250 lb),      Labs:    Lab Results   Component Value Date/Time    Sodium 141 07/11/2017 05:09 AM    Potassium 4.1 07/11/2017 05:09 AM    Chloride 104 07/11/2017 05:09 AM    CO2 28 07/11/2017 05:09  AM    Glucose 175 (H) 07/11/2017 05:09 AM    BUN 15 07/11/2017 05:09 AM    Creatinine 0.47 (L) 07/11/2017 05:09 AM    Calcium 8.2 (L) 07/11/2017 05:09 AM    Magnesium 2.0 07/13/2017 03:57 AM    Phosphorus 3.9 07/13/2017 03:57 AM    Albumin 1.7 (L) 07/11/2017 05:09 AM     Lab Results   Component Value Date/Time    Hemoglobin A1c 7.3 (H) 05/13/2017 03:04 AM    Hemoglobin A1c (POC) 8.4 (A) 07/01/2017 02:50 PM     Lab Results   Component Value Date/Time    Glucose 175 (H) 07/11/2017 05:09 AM    Glucose (POC) 165 (H) 07/13/2017 11:19 AM      Lab Results   Component Value Date/Time    ALT (SGPT) 15 07/11/2017 05:09 AM    AST (SGOT) 9 (L) 07/11/2017 05:09 AM    Alk. phosphatase 47 07/11/2017 05:09 AM    Bilirubin, total 0.4 07/11/2017 05:09 AM        Logan Bores, MSN-W, RD, CDE  Pager [phone] or [phone]

## 2017-07-13 NOTE — Progress Notes (Signed)
Ms. Nedra HaiLee is doing well today. No complaints. Tolerating clear liquids.  Tm 98.7 HR: 76 BP: 177/92 Resp Rate: 18 97% sat on room air.     Intake/Output Summary (Last 24 hours) at 07/13/2017 1044  Last data filed at 07/13/2017 0724  Gross per 24 hour   Intake 3100 ml   Output ???   Net 3100 ml   Exam: Cor: RRR.              Lungs: Bilateral breath sounds. Clear to auscultation.              Abd: Soft. Non distended.             Non tender. No guarding or rebound.             Less tender at drain.  Labs:   Recent Results (from the past 12 hour(s))   CBC WITH AUTOMATED DIFF    Collection Time: 07/13/17  3:57 AM   Result Value Ref Range    WBC 8.0 3.6 - 11.0 K/uL    RBC 3.59 (L) 3.80 - 5.20 M/uL    HGB 10.5 (L) 11.5 - 16.0 g/dL    HCT 95.633.4 (L) 21.335.0 - 47.0 %    MCV 93.0 80.0 - 99.0 FL    MCH 29.2 26.0 - 34.0 PG    MCHC 31.4 30.0 - 36.5 g/dL    RDW 08.615.6 (H) 57.811.5 - 14.5 %    PLATELET 285 150 - 400 K/uL    MPV 10.0 8.9 - 12.9 FL    NRBC 0.0 0 PER 100 WBC    ABSOLUTE NRBC 0.00 0.00 - 0.01 K/uL    NEUTROPHILS 66 32 - 75 %    LYMPHOCYTES 24 12 - 49 %    MONOCYTES 8 5 - 13 %    EOSINOPHILS 1 0 - 7 %    BASOPHILS 0 0 - 1 %    IMMATURE GRANULOCYTES 2 (H) 0.0 - 0.5 %    ABS. NEUTROPHILS 5.3 1.8 - 8.0 K/UL    ABS. LYMPHOCYTES 1.9 0.8 - 3.5 K/UL    ABS. MONOCYTES 0.6 0.0 - 1.0 K/UL    ABS. EOSINOPHILS 0.0 0.0 - 0.4 K/UL    ABS. BASOPHILS 0.0 0.0 - 0.1 K/UL    ABS. IMM. GRANS. 0.1 (H) 0.00 - 0.04 K/UL    DF AUTOMATED     MAGNESIUM    Collection Time: 07/13/17  3:57 AM   Result Value Ref Range    Magnesium 2.0 1.6 - 2.4 mg/dL   PHOSPHORUS    Collection Time: 07/13/17  3:57 AM   Result Value Ref Range    Phosphorus 3.9 2.6 - 4.7 MG/DL   GLUCOSE, POC    Collection Time: 07/13/17  6:38 AM   Result Value Ref Range    Glucose (POC) 295 (H) 65 - 100 mg/dL    Performed by Cherlyn LabellaGOLDEN SAVANNAH    Advance diet to regular.  Will not renew TPN/Lipids.  Stop IV abx.   Continue protonix BID - change to po.   Pain medication and anti-emetics as needed.    OOB, Ambulate.   Repeat CT scan abdomen/pelvis with po/IV contrast - 07/14/2017. If abscess resolved then will remove drain.   Plans per Dr. Cherly HensenPahle.

## 2017-07-13 NOTE — Progress Notes (Signed)
Medical Progress Note      NAME: Dana [name]   DOB:  December 05, 1955  MRM:  [id number]    Date/Time: 07/13/2017  1:58 PM    Problem List:   Principal Problem:    Abdominal pain (07/02/2017)    Active Problems:    HTN (hypertension) ()      Diabetes (HCC) (01/24/2013)      Acute gastric ulcer with perforation (HCC) (05/13/2017)           Subjective:     Patient feeling better, tolerating liquids    Past Medical History:   Diagnosis Date   ??? Diabetes (HCC) 01/24/2013   ??? GERD (gastroesophageal reflux disease)    ??? Hives    ??? HTN (hypertension)    ??? Hypercholesterolemia    ??? Perforated gastric ulcer (HCC)        ROS:  General: negative for fever, chills, sweats, weakness  Respiratory:  negative for cough, sputum production, SOB, wheezing, DOE, pleuritic pain  Cardiology:  negative for chest pain, palpitations, orthopnea, PND, edema, syncope   Gastrointestinal: positive for minimal abd pain         Objective:       Vitals:          Last 24hrs VS reviewed since prior progress note. Most recent are:    Visit Vitals  BP (!) 177/92 (BP 1 Location: Right arm, BP Patient Position: At rest)   Pulse 76   Temp 98.7 ??F (37.1 ??C)   Resp 18   Ht 5\' 4"  (1.626 m)   Wt 235 lb 4.8 oz (106.7 kg)   SpO2 97%   BMI 40.39 kg/m??     SpO2 Readings from Last 6 Encounters:   07/13/17 97%   06/09/17 98%   05/23/17 98%   05/18/17 93%   05/20/15 94%   11/30/10 100%    O2 Flow Rate (L/min): 2 l/min       Intake/Output Summary (Last 24 hours) at 07/13/2017 1358  Last data filed at 07/13/2017 0724  Gross per 24 hour   Intake 2900 ml   Output ???   Net 2900 ml          Exam:     General   Obese wf-  Respiratory   Clear To Auscultation bilaterally - no wheezes, rales, rhonchi, or crackles  Cardiology  regular  Abdominal  Soft, minimal tenderness  Extremities  No clubbing, cyanosis, or edema. Pulses intact.    Lab Data Reviewed: (see below)    Medications Reviewed: (see below)    ______________________________________________________________________     Medications:     Current Facility-Administered Medications   Medication Dose Route Frequency   ??? pantoprazole (PROTONIX) tablet 40 mg  40 mg Oral Q12H   ??? sodium chloride 0.9 % bolus infusion 100 mL  100 mL IntraVENous RAD ONCE   ??? iohexol (OMNIPAQUE) 240 mg iodine/mL solution 50 mL  50 mL Oral RAD ONCE   ??? iopamidol (ISOVUE-370) 76 % injection 100 mL  100 mL IntraVENous RAD ONCE   ??? sodium chloride (NS) flush 10 mL  10 mL IntraVENous RAD ONCE   ??? insulin NPH (NOVOLIN N, HUMULIN N) injection 28 Units  28 Units SubCUTAneous QHS   ??? zolpidem (AMBIEN) tablet 5 mg  5 mg Oral QHS   ??? sodium chloride (NS) flush 20 mL  20 mL InterCATHeter PRN   ??? sodium chloride (NS) flush 10 mL  10 mL InterCATHeter Q24H   ??? sodium  chloride (NS) flush 10 mL  10 mL InterCATHeter PRN   ??? sodium chloride (NS) flush 10 mL  10 mL InterCATHeter Q8H   ??? alteplase (CATHFLO) 1 mg in sterile water (preservative free) 1 mL injection  1 mg InterCATHeter PRN   ??? bacitracin 500 unit/gram packet 1 Packet  1 Packet Topical PRN   ??? HYDROmorphone (DILAUDID) injection 2 mg  2 mg IntraVENous Q3H PRN   ??? enoxaparin (LOVENOX) injection 40 mg  40 mg SubCUTAneous DAILY   ??? simvastatin (ZOCOR) tablet 40 mg  40 mg Oral QHS   ??? sertraline (ZOLOFT) tablet 100 mg  100 mg Oral DAILY   ??? sodium chloride (NS) flush 5-10 mL  5-10 mL IntraVENous Q8H   ??? sodium chloride (NS) flush 5-10 mL  5-10 mL IntraVENous PRN   ??? acetaminophen (TYLENOL) tablet 650 mg  650 mg Oral Q4H PRN   ??? ondansetron (ZOFRAN) injection 4 mg  4 mg IntraVENous Q4H PRN   ??? hydrALAZINE (APRESOLINE) 20 mg/mL injection 20 mg  20 mg IntraVENous Q6H PRN   ??? insulin lispro (HUMALOG) injection   SubCUTAneous AC&HS   ??? glucose chewable tablet 16 g  4 Tab Oral PRN   ??? dextrose (D50W) injection syrg 12.5-25 g  12.5-25 g IntraVENous PRN   ??? glucagon (GLUCAGEN) injection 1 mg  1 mg IntraMUSCular PRN   ??? cloNIDine HCl (CATAPRES) tablet 0.1 mg  0.1 mg Oral Q4H PRN            Lab Review:     Recent Labs     07/13/17   0357 07/12/17  0412 07/11/17  0509   WBC 8.0 7.8 7.2   HGB 10.5* 10.6* 9.9*   HCT 33.4* 34.1* 32.2*   PLT 285 298 251     Recent Labs     07/13/17  0357 07/12/17  0412 07/11/17  0509   NA  --   --  141   K  --   --  4.1   CL  --   --  104   CO2  --   --  28   GLU  --   --  175*   BUN  --   --  15   CREA  --   --  0.47*   CA  --   --  8.2*   MG 2.0 1.9 2.0   PHOS 3.9 3.7 4.4   ALB  --   --  1.7*   TBILI  --   --  0.4   SGOT  --   --  9*   ALT  --   --  15     Lab Results   Component Value Date/Time    Glucose (POC) 165 (H) 07/13/2017 11:19 AM    Glucose (POC) 295 (H) 07/13/2017 06:38 AM    Glucose (POC) 218 (H) 07/12/2017 09:15 PM    Glucose (POC) 184 (H) 07/12/2017 04:21 PM    Glucose (POC) 234 (H) 07/12/2017 12:27 PM     No results for input(s): PH, PCO2, PO2, HCO3, FIO2 in the last 72 hours.  No results for input(s): INR in the last 72 hours.    No lab exists for component: INREXT    Other pertinent lab: NA         Assessment:     Patient Active Problem List   Diagnosis Code   ??? Hypercholesterolemia E78.00   ??? HTN (hypertension) I10   ??? Diabetes (HCC)  E11.9   ??? Sleep apnea G47.30   ??? Hypertension complicating diabetes (HCC) E11.59, I10   ??? Morbid obesity due to excess calories (HCC) E66.01   ??? Hives L50.9   ??? Acute gastric ulcer with perforation (HCC) K25.1   ??? Arthralgia of both knees M25.561, M25.562   ??? Abdominal pain R10.9          Plan:                 1. Bowel perf- s/p drainage. On zosyn  2. Tolerating liquids  3. Diabetes- blood sugars should improve with d/c of tpn                ___________________________________________________    Attending Physician: Lake Bells, MD

## 2017-07-13 NOTE — Other (Signed)
DTC Progress Note    Recommendations/ Comments: Chart reviewed on Dana [name] for hyperglycemia. Pt admitted d/t RUQ abdominal pain workup. Steroids tapered and dose completed on 07/07/17. TPN discontinued as of 7am this morning. Pt now tolerating clear liquids.   BG noted consistently above 200 mg/dl on 29/01/6210/6/18. FBG at 295 mg/dl this am despite increase in NPH from 24 to 28 units over night. Anticipate BG to begin trending down with discontinuation of TPN.     If appropriate, may consider:  Maintaining NPH dose at 28 units for now given d/c of TPN and pt only tolerating clears - continue to increase insulin as needed to promote BG <180 mg/dl as diet increased  Continue SSI as needed  Increase diet as tolerated and monitor PO       Current hospital DM medication:  NPH, 28 units HS   Lispro, normal scale - 15 units of correction received x 24 hours      Patient is a 61 y.o. female with known history of DM on NPH and Metformin at home.    A1c:   Lab Results   Component Value Date/Time    Hemoglobin A1c 7.3 (H) 05/13/2017 03:04 AM    Hemoglobin A1c 7.2 (H) 01/31/2015 03:52 PM       Recent Glucose Results:   Lab Results   Component Value Date/Time    GLUCPOC 295 (H) 07/13/2017 06:38 AM    GLUCPOC 218 (H) 07/12/2017 09:15 PM    GLUCPOC 184 (H) 07/12/2017 04:21 PM        Lab Results   Component Value Date/Time    Creatinine 0.47 (L) 07/11/2017 05:09 AM     Estimated Creatinine Clearance: 159.7 mL/min (A) (based on SCr of 0.47 mg/dL (L)).    Active Orders   Diet    DIET DIABETIC CONSISTENT CARB Regular        PO intake:   Patient Vitals for the past 72 hrs:   % Diet Eaten   07/12/17 1727 50 %   07/10/17 1804 0 %   07/10/17 1135 0 %       Will continue to follow as needed.    Thank you,  Dana [name], RD   [facility]

## 2017-07-13 NOTE — Progress Notes (Signed)
Problem: Falls - Risk of  Goal: *Absence of Falls  Document Schmid Fall Risk and appropriate interventions in the flowsheet.  Outcome: Progressing Towards Goal  Fall Risk Interventions:  Mobility Interventions: Communicate number of staff needed for ambulation/transfer         Medication Interventions: Teach patient to arise slowly    Elimination Interventions: Call light in reach             Problem: Pressure Injury - Risk of  Goal: *Prevention of pressure injury  Document Braden Scale and appropriate interventions in the flowsheet.  Outcome: Progressing Towards Goal  Pressure Injury Interventions:  Sensory Interventions: Avoid rigorous massage over bony prominences    Moisture Interventions: Maintain skin hydration (lotion/cream)    Activity Interventions: Assess need for specialty bed    Mobility Interventions: Pressure redistribution bed/mattress (bed type)    Nutrition Interventions: Document food/fluid/supplement intake    Friction and Shear Interventions: HOB 30 degrees or less, Lift sheet

## 2017-07-14 LAB — GLUCOSE, POC
Glucose (POC): 180 mg/dL — ABNORMAL HIGH (ref 65–100)
Glucose (POC): 204 mg/dL — ABNORMAL HIGH (ref 65–100)
Glucose (POC): 206 mg/dL — ABNORMAL HIGH (ref 65–100)
Glucose (POC): 156 mg/dL — ABNORMAL HIGH (ref 65–100)

## 2017-07-14 LAB — CBC WITH AUTOMATED DIFF
ABS. BASOPHILS: 0.1 10*3/uL (ref 0.0–0.1)
ABS. EOSINOPHILS: 0.1 10*3/uL (ref 0.0–0.4)
ABS. IMM. GRANS.: 0.1 10*3/uL — ABNORMAL HIGH (ref 0.00–0.04)
ABS. LYMPHOCYTES: 2.3 10*3/uL (ref 0.8–3.5)
ABS. MONOCYTES: 0.6 10*3/uL (ref 0.0–1.0)
ABS. NEUTROPHILS: 6.8 10*3/uL (ref 1.8–8.0)
ABSOLUTE NRBC: 0 10*3/uL (ref 0.00–0.01)
BASOPHILS: 1 % (ref 0–1)
EOSINOPHILS: 1 % (ref 0–7)
HCT: 35 % (ref 35.0–47.0)
HGB: 10.9 g/dL — ABNORMAL LOW (ref 11.5–16.0)
IMMATURE GRANULOCYTES: 1 % — ABNORMAL HIGH (ref 0.0–0.5)
LYMPHOCYTES: 23 % (ref 12–49)
MCH: 29.1 PG (ref 26.0–34.0)
MCHC: 31.1 g/dL (ref 30.0–36.5)
MCV: 93.6 FL (ref 80.0–99.0)
MONOCYTES: 6 % (ref 5–13)
MPV: 9.9 FL (ref 8.9–12.9)
NEUTROPHILS: 68 % (ref 32–75)
NRBC: 0 PER 100 WBC
PLATELET: 290 10*3/uL (ref 150–400)
RBC: 3.74 M/uL — ABNORMAL LOW (ref 3.80–5.20)
RDW: 15.8 % — ABNORMAL HIGH (ref 11.5–14.5)
WBC: 9.9 10*3/uL (ref 3.6–11.0)

## 2017-07-14 LAB — MAGNESIUM: Magnesium: 1.9 mg/dL (ref 1.6–2.4)

## 2017-07-14 LAB — PHOSPHORUS: Phosphorus: 3.8 MG/DL (ref 2.6–4.7)

## 2017-07-14 MED ORDER — PANTOPRAZOLE 40 MG TAB, DELAYED RELEASE
40 mg | ORAL_TABLET | Freq: Two times a day (BID) | ORAL | 1 refills | Status: DC
Start: 2017-07-14 — End: 2017-10-03

## 2017-07-14 MED ORDER — HYDROCHLOROTHIAZIDE 25 MG TAB
25 mg | Freq: Every day | ORAL | Status: DC
Start: 2017-07-14 — End: 2017-07-14
  Administered 2017-07-14: 16:00:00 via ORAL

## 2017-07-14 MED FILL — ZOLPIDEM 5 MG TAB: 5 mg | ORAL | Qty: 1

## 2017-07-14 MED FILL — NORMAL SALINE FLUSH 0.9 % INJECTION SYRINGE: INTRAMUSCULAR | Qty: 10

## 2017-07-14 MED FILL — ENOXAPARIN 40 MG/0.4 ML SUB-Q SYRINGE: 40 mg/0.4 mL | SUBCUTANEOUS | Qty: 0.4

## 2017-07-14 MED FILL — SIMVASTATIN 20 MG TAB: 20 mg | ORAL | Qty: 2

## 2017-07-14 MED FILL — SERTRALINE 50 MG TAB: 50 mg | ORAL | Qty: 2

## 2017-07-14 MED FILL — PANTOPRAZOLE 40 MG TAB, DELAYED RELEASE: 40 mg | ORAL | Qty: 1

## 2017-07-14 MED FILL — ACETAMINOPHEN 325 MG TABLET: 325 mg | ORAL | Qty: 2

## 2017-07-14 MED FILL — HYDROCHLOROTHIAZIDE 25 MG TAB: 25 mg | ORAL | Qty: 1

## 2017-07-14 MED FILL — INSULIN LISPRO 100 UNIT/ML INJECTION: 100 unit/mL | SUBCUTANEOUS | Qty: 1

## 2017-07-14 NOTE — Progress Notes (Signed)
Daily Progress Note  [facility] [facility]  Admit Date: 07/02/2017   Day 9 post CT guided drainage of intraabdominal abscess    Subjective:     Last 24 hrs: Denies pain.  Eating well, tolerating PO, moving bowels.  Eager for discharge.  CT results reviewed.  Drain with no output x 2 days.       Objective:     Blood pressure (!) 156/97, pulse 78, temperature 98.4 ??F (36.9 ??C), resp. rate 18, height 5\' 4"  (1.626 m), weight 106 kg (233 lb 9.6 oz), SpO2 96 %.  Temp (24hrs), Avg:98.4 ??F (36.9 ??C), Min:98.1 ??F (36.7 ??C), Max:98.5 ??F (36.9 ??C)      _____________________  Physical Exam:     Alert and Oriented, sitting up in bed, no acute distress.  Cardiovascular: RRR, no peripheral edema  Lungs:CTAB   Abdomen: soft, NT.  Foul odor from drainage tubing.       Assessment:   Principal Problem:    Abdominal pain (07/02/2017)    Active Problems:    HTN (hypertension) ()      Diabetes (HCC) (01/24/2013)      Acute gastric ulcer with perforation (HCC) (05/13/2017)      S/p CT guided drainage of intraabdominal abscess      Plan:     D/W Dr. Laurita Quintohen  Stable for discharge  Reviewed instructions  DC drain and PICC line  F/U in office with Dr. Amada JupiterShindel on Thursday, Nov 15 at 10:15am, [address].         Bary RichardLisa Anderson Coppock, ACNP - [facility]  [facility] [facility]  [address],  [address] [place], [address]  [place], [place]  [phone]    Data Review:    Recent Labs     07/14/17  0335 07/13/17  0357 07/12/17  0412   WBC 9.9 8.0 7.8   HGB 10.9* 10.5* 10.6*   HCT 35.0 33.4* 34.1*   PLT 290 285 298     Recent Labs     07/14/17  0335 07/13/17  0357 07/12/17  0412   MG 1.9 2.0 1.9   PHOS 3.8 3.9 3.7     No results for input(s): AML, LPSE in the last 72 hours.        ______________________  Medications:    Current Facility-Administered Medications   Medication Dose Route Frequency   ??? hydroCHLOROthiazide (HYDRODIURIL) tablet 12.5 mg  12.5 mg Oral DAILY   ??? pantoprazole (PROTONIX) tablet 40 mg  40 mg Oral Q12H    ??? insulin NPH (NOVOLIN N, HUMULIN N) injection 28 Units  28 Units SubCUTAneous QHS   ??? zolpidem (AMBIEN) tablet 5 mg  5 mg Oral QHS   ??? sodium chloride (NS) flush 20 mL  20 mL InterCATHeter PRN   ??? sodium chloride (NS) flush 10 mL  10 mL InterCATHeter Q24H   ??? sodium chloride (NS) flush 10 mL  10 mL InterCATHeter PRN   ??? sodium chloride (NS) flush 10 mL  10 mL InterCATHeter Q8H   ??? alteplase (CATHFLO) 1 mg in sterile water (preservative free) 1 mL injection  1 mg InterCATHeter PRN   ??? bacitracin 500 unit/gram packet 1 Packet  1 Packet Topical PRN   ??? HYDROmorphone (DILAUDID) injection 2 mg  2 mg IntraVENous Q3H PRN   ??? enoxaparin (LOVENOX) injection 40 mg  40 mg SubCUTAneous DAILY   ??? simvastatin (ZOCOR) tablet 40 mg  40 mg Oral QHS   ???  sertraline (ZOLOFT) tablet 100 mg  100 mg Oral DAILY   ??? sodium chloride (NS) flush 5-10 mL  5-10 mL IntraVENous Q8H   ??? sodium chloride (NS) flush 5-10 mL  5-10 mL IntraVENous PRN   ??? acetaminophen (TYLENOL) tablet 650 mg  650 mg Oral Q4H PRN   ??? ondansetron (ZOFRAN) injection 4 mg  4 mg IntraVENous Q4H PRN   ??? hydrALAZINE (APRESOLINE) 20 mg/mL injection 20 mg  20 mg IntraVENous Q6H PRN   ??? insulin lispro (HUMALOG) injection   SubCUTAneous AC&HS   ??? glucose chewable tablet 16 g  4 Tab Oral PRN   ??? dextrose (D50W) injection syrg 12.5-25 g  12.5-25 g IntraVENous PRN   ??? glucagon (GLUCAGEN) injection 1 mg  1 mg IntraMUSCular PRN   ??? cloNIDine HCl (CATAPRES) tablet 0.1 mg  0.1 mg Oral Q4H PRN

## 2017-07-14 NOTE — Discharge Summary (Signed)
Physician Discharge Summary     Patient ID:  Dana [name]  [id number]  61 y.o.  09/14/1955    Admit date: 07/02/2017    Discharge date and time: 07/14/2017    Briefly, pt was admitted with Abdominal pain.  For details of admission, see H&P.    Hospital Course:  Patient admitted and started on zosyn.  Ct scan initially indeterminate, but repeat showed contained perforation with cavity ruq.  This was drained.  Patient started on tpn and steroids tapered.  She continued to require dilaudid for pain, repeat ct showed drain not in optimal position and might be causing some sq emphysema in abd wall.  She underwent ugi which did not show any leak.  Blood sugars controlled with insulin in tpn bag.  She was continued on antibiotics, tpn stopped, diet advanced and she slowly improved.  Drain was pulled.    Discharge Dx: abdominal abscess, diabetes, hypertension    Condition at discharge: improved    Disposition: home    Patient Instructions:   This patient is currently admitted, however, discharge medications can only be displayed within the current admission encounter.      Follow-up with Dr. Zacari Stiff in 1 week.      Signed:  Kayen Grabel J Algis Lehenbauer, MD  08/08/2017  1:11 PM

## 2017-07-14 NOTE — Progress Notes (Addendum)
Bedside and Verbal shift change report given to Fritzi MandesKirsten (Cabin crewoncoming nurse) by Production assistant, radioCrystal (offgoing nurse). Report included the following information SBAR, MAR and Recent Results.     I have reviewed discharge instructions with the patient.  The patient verbalized understanding.    Lenard GallowayKirsten J Larson

## 2017-07-14 NOTE — Progress Notes (Signed)
NUTRITION COMPLETE ASSESSMENT    RECOMMENDATIONS:   No changes Education Completed     Interventions/Plan:    Nutrition Related Education - completed    Assessment:   Reason for Assessment:   [x]   Reassessment     Diet: Regular   TPN:     Supplements: none  Nutritionally Significant Medications: [x]  Reviewed  Meal Intake:   Patient Vitals for the past 100 hrs:   % Diet Eaten   07/12/17 1727 50 %   07/10/17 1804 0 %   07/10/17 1135 0 %     Objective:  Pt admitted abd pain. PMHx: Acute gastric ulcer with perforation (05/13/17), DM, HTN, GERD. Pt followed by GI surgery. Pt is tolerating clears well. Chart reviewed. DW RN. TPN not reordered to tonight. Pt off unit for testing & expects to trial solid foods once back to the unit.      07/14/17: RD visited with patient today & provided education by answering questions posed by the patient. Summary of education & discussion noted on discharge instructions. Eating well and looking forward to discharge.     Estimated Nutrition Needs:   Kcals/day: 2050 Kcals/day  Protein:   109 gm (~2 gm/kg IBW)  Fluid: 2000 ml(~ 25m/kcal)   Based On: [place] Jeor(MSJ x 1.2)  Weight Used: Actual wt(108.1 kg)    Nutrition Diagnosis:   1. Food and nutrition-related knowledge deficit related to general healthy diet after gastric repair as evidenced by limited previous exposure to information and asking questions of RD upon interview.  - resolving      Goals:     Pt is able to answer questions posed by RD to demonstrate learning  - completed      Monitoring & Evaluation:    - Total energy intake   - Lean body mass, fat free mass, GI, Protein profile, Weight/weight change     Previous Nutrition Goals Met:  Yes  Previous Recommendations:      Yes    Education & Discharge Needs:   []  None Identified   [x]  Identified and addressed: completed    [x]  Participated in care plan, discharge planning, and/or interdisciplinary rounds        Cultural, religious and ethnic food preferences identified:   None     Skin Integrity: [] Intact  [x] Other (surgical incisions)  Edema: [x] None [] Other  Food Allergies: [x] None [] Other    Anthropometrics:    Weight Loss Metrics 07/14/2017 07/01/2017 06/09/2017 05/24/2017 05/23/2017 05/16/2017 03/17/2017   Today's Wt 233 lb 9.6 oz 232 lb 236 lb 232 lb 235 lb 254 lb 3.1 oz 250 lb   BMI 40.1 kg/m2 39.82 kg/m2 40.51 kg/m2 39.82 kg/m2 36.81 kg/m2 39.81 kg/m2 43.59 kg/m2      Last 3 Recorded Weights in this Encounter    07/12/17 1118 07/13/17 1129 07/14/17 0508   Weight: 114.3 kg (252 lb) 106.7 kg (235 lb 4.8 oz) 106 kg (233 lb 9.6 oz)      Weight Source: Bed  Height: 5' 4"  (162.6 cm),    Body mass index is 40.1 kg/m??.  IBW : 54.4 kg (120 lb),    Usual Body Weight: 113.4 kg (250 lb),      Labs:    Lab Results   Component Value Date/Time    Sodium 141 07/11/2017 05:09 AM    Potassium 4.1 07/11/2017 05:09 AM    Chloride 104 07/11/2017 05:09 AM    CO2 28 07/11/2017 05:09 AM    Glucose 175 (H)  07/11/2017 05:09 AM    BUN 15 07/11/2017 05:09 AM    Creatinine 0.47 (L) 07/11/2017 05:09 AM    Calcium 8.2 (L) 07/11/2017 05:09 AM    Magnesium 1.9 07/14/2017 03:35 AM    Phosphorus 3.8 07/14/2017 03:35 AM    Albumin 1.7 (L) 07/11/2017 05:09 AM     Lab Results   Component Value Date/Time    Hemoglobin A1c 7.3 (H) 05/13/2017 03:04 AM    Hemoglobin A1c (POC) 8.4 (A) 07/01/2017 02:50 PM     Lab Results   Component Value Date/Time    Glucose 175 (H) 07/11/2017 05:09 AM    Glucose (POC) 206 (H) 07/14/2017 11:37 AM      Lab Results   Component Value Date/Time    ALT (SGPT) 15 07/11/2017 05:09 AM    AST (SGOT) 9 (L) 07/11/2017 05:09 AM    Alk. phosphatase 47 07/11/2017 05:09 AM    Bilirubin, total 0.4 07/11/2017 05:09 AM        Logan Bores, MSN-W, RD, CDE  Pager [phone] or [phone]

## 2017-07-14 NOTE — Progress Notes (Signed)
Medical Progress Note      NAME: Neva SeatRebecca D Drohan   DOB:  01/26/1956  MRM:  [id number]    Date/Time: 07/14/2017  12:28 PM    Problem List:   Principal Problem:    Abdominal pain (07/02/2017)    Active Problems:    HTN (hypertension) ()      Diabetes (HCC) (01/24/2013)      Acute gastric ulcer with perforation (HCC) (05/13/2017)           Subjective:     Patient feeling better    Past Medical History:   Diagnosis Date   ??? Diabetes (HCC) 01/24/2013   ??? GERD (gastroesophageal reflux disease)    ??? Hives    ??? HTN (hypertension)    ??? Hypercholesterolemia    ??? Perforated gastric ulcer (HCC)        ROS:  General: negative for fever, chills, sweats, weakness  Respiratory:  negative for cough, sputum production, SOB, wheezing, DOE, pleuritic pain  Cardiology:  negative for chest pain, palpitations, orthopnea, PND, edema, syncope   Gastrointestinal: negative for abdominal pain, N/V, dysphagia, change in bowel habits, bleeding         Objective:       Vitals:          Last 24hrs VS reviewed since prior progress note. Most recent are:    Visit Vitals  BP (!) 156/94   Pulse 70   Temp 98.5 ??F (36.9 ??C)   Resp 18   Ht 5\' 4"  (1.626 m)   Wt 233 lb 9.6 oz (106 kg)   SpO2 97%   BMI 40.10 kg/m??     SpO2 Readings from Last 6 Encounters:   07/14/17 97%   06/09/17 98%   05/23/17 98%   05/18/17 93%   05/20/15 94%   11/30/10 100%    O2 Flow Rate (L/min): 2 l/min   No intake or output data in the 24 hours ending 07/14/17 1228       Exam:     General   well developed, well nourished, appears stated age, in no acute distress  Respiratory   Clear To Auscultation bilaterally - no wheezes, rales, rhonchi, or crackles  Cardiology  Regular  Abdomen- soft, good bowel sounds  Extremities  No clubbing, cyanosis, or edema. Pulses intact.    Lab Data Reviewed: (see below)    Medications Reviewed: (see below)    ______________________________________________________________________    Medications:     Current Facility-Administered Medications    Medication Dose Route Frequency   ??? hydroCHLOROthiazide (HYDRODIURIL) tablet 12.5 mg  12.5 mg Oral DAILY   ??? pantoprazole (PROTONIX) tablet 40 mg  40 mg Oral Q12H   ??? insulin NPH (NOVOLIN N, HUMULIN N) injection 28 Units  28 Units SubCUTAneous QHS   ??? zolpidem (AMBIEN) tablet 5 mg  5 mg Oral QHS   ??? sodium chloride (NS) flush 20 mL  20 mL InterCATHeter PRN   ??? sodium chloride (NS) flush 10 mL  10 mL InterCATHeter Q24H   ??? sodium chloride (NS) flush 10 mL  10 mL InterCATHeter PRN   ??? sodium chloride (NS) flush 10 mL  10 mL InterCATHeter Q8H   ??? alteplase (CATHFLO) 1 mg in sterile water (preservative free) 1 mL injection  1 mg InterCATHeter PRN   ??? bacitracin 500 unit/gram packet 1 Packet  1 Packet Topical PRN   ??? HYDROmorphone (DILAUDID) injection 2 mg  2 mg IntraVENous Q3H PRN   ??? enoxaparin (LOVENOX) injection  40 mg  40 mg SubCUTAneous DAILY   ??? simvastatin (ZOCOR) tablet 40 mg  40 mg Oral QHS   ??? sertraline (ZOLOFT) tablet 100 mg  100 mg Oral DAILY   ??? sodium chloride (NS) flush 5-10 mL  5-10 mL IntraVENous Q8H   ??? sodium chloride (NS) flush 5-10 mL  5-10 mL IntraVENous PRN   ??? acetaminophen (TYLENOL) tablet 650 mg  650 mg Oral Q4H PRN   ??? ondansetron (ZOFRAN) injection 4 mg  4 mg IntraVENous Q4H PRN   ??? hydrALAZINE (APRESOLINE) 20 mg/mL injection 20 mg  20 mg IntraVENous Q6H PRN   ??? insulin lispro (HUMALOG) injection   SubCUTAneous AC&HS   ??? glucose chewable tablet 16 g  4 Tab Oral PRN   ??? dextrose (D50W) injection syrg 12.5-25 g  12.5-25 g IntraVENous PRN   ??? glucagon (GLUCAGEN) injection 1 mg  1 mg IntraMUSCular PRN   ??? cloNIDine HCl (CATAPRES) tablet 0.1 mg  0.1 mg Oral Q4H PRN            Lab Review:     Recent Labs     07/14/17  0335 07/13/17  0357 07/12/17  0412   WBC 9.9 8.0 7.8   HGB 10.9* 10.5* 10.6*   HCT 35.0 33.4* 34.1*   PLT 290 285 298     Recent Labs     07/14/17  0335 07/13/17  0357 07/12/17  0412   MG 1.9 2.0 1.9   PHOS 3.8 3.9 3.7     Lab Results   Component Value Date/Time     Glucose (POC) 206 (H) 07/14/2017 11:37 AM    Glucose (POC) 204 (H) 07/14/2017 06:23 AM    Glucose (POC) 180 (H) 07/13/2017 09:40 PM    Glucose (POC) 134 (H) 07/13/2017 03:57 PM    Glucose (POC) 165 (H) 07/13/2017 11:19 AM     No results for input(s): PH, PCO2, PO2, HCO3, FIO2 in the last 72 hours.  No results for input(s): INR in the last 72 hours.    No lab exists for component: INREXT    Other pertinent lab: NA         Assessment:     Patient Active Problem List   Diagnosis Code   ??? Hypercholesterolemia E78.00   ??? HTN (hypertension) I10   ??? Diabetes (HCC) E11.9   ??? Sleep apnea G47.30   ??? Hypertension complicating diabetes (HCC) E11.59, I10   ??? Morbid obesity due to excess calories (HCC) E66.01   ??? Hives L50.9   ??? Acute gastric ulcer with perforation (HCC) K25.1   ??? Arthralgia of both knees M25.561, M25.562   ??? Abdominal pain R10.9          Plan:                 1. Bowel perf- s/p drainage,  Tolerating regular diet  2. Diabetes- improved bs off tpn  3. ?home today?                ___________________________________________________    Attending Physician: Lake BellsNancy J Dericka Ostenson, MD

## 2017-07-14 NOTE — Discharge Summary (Signed)
Physician Discharge Summary     Patient ID:  Neva SeatRebecca D Lame  [id number]  61 y.o.  12/06/1955    Admit date: 07/02/2017    Discharge date and time: 07/14/2017    Briefly, pt was admitted with Abdominal pain.  For details of admission, see H&P.    Hospital Course:  Patient admitted and started on zosyn.  Ct scan initially indeterminate, but repeat showed contained perforation with cavity ruq.  This was drained.  Patient started on tpn and steroids tapered.  She continued to require dilaudid for pain, repeat ct showed drain not in optimal position and might be causing some sq emphysema in abd wall.  She underwent ugi which did not show any leak.  Blood sugars controlled with insulin in tpn bag.  She was continued on antibiotics, tpn stopped, diet advanced and she slowly improved.  Drain was pulled.    Discharge Dx: abdominal abscess, diabetes, hypertension    Condition at discharge: improved    Disposition: home    Patient Instructions:   This patient is currently admitted, however, discharge medications can only be displayed within the current admission encounter.      Follow-up with Dr. Cherly HensenPahle in 1 week.      Signed:  Lake BellsNancy J Ethelean Colla, MD  08/08/2017  1:11 PM

## 2017-07-17 ENCOUNTER — Emergency Department: Admit: 2017-07-17 | Payer: BLUE CROSS/BLUE SHIELD | Primary: Internal Medicine

## 2017-07-17 ENCOUNTER — Inpatient Hospital Stay
Admit: 2017-07-17 | Discharge: 2017-07-26 | Disposition: A | Discharge status: Home Health | Payer: BLUE CROSS/BLUE SHIELD | Attending: Internal Medicine | Admitting: Internal Medicine

## 2017-07-17 DIAGNOSIS — T8143XA Infection following a procedure, organ and space surgical site, initial encounter: Principal | ICD-10-CM

## 2017-07-17 LAB — CBC WITH AUTOMATED DIFF
ABS. BASOPHILS: 0.1 10*3/uL (ref 0.0–0.1)
ABS. EOSINOPHILS: 0 10*3/uL (ref 0.0–0.4)
ABS. IMM. GRANS.: 0.1 10*3/uL — ABNORMAL HIGH (ref 0.00–0.04)
ABS. LYMPHOCYTES: 1.4 10*3/uL (ref 0.8–3.5)
ABS. MONOCYTES: 1.2 10*3/uL — ABNORMAL HIGH (ref 0.0–1.0)
ABS. NEUTROPHILS: 12.3 10*3/uL — ABNORMAL HIGH (ref 1.8–8.0)
ABSOLUTE NRBC: 0 10*3/uL (ref 0.00–0.01)
BASOPHILS: 0 % (ref 0–1)
EOSINOPHILS: 0 % (ref 0–7)
HCT: 38.6 % (ref 35.0–47.0)
HGB: 12.2 g/dL (ref 11.5–16.0)
IMMATURE GRANULOCYTES: 1 % — ABNORMAL HIGH (ref 0.0–0.5)
LYMPHOCYTES: 10 % — ABNORMAL LOW (ref 12–49)
MCH: 29.2 PG (ref 26.0–34.0)
MCHC: 31.6 g/dL (ref 30.0–36.5)
MCV: 92.3 FL (ref 80.0–99.0)
MONOCYTES: 8 % (ref 5–13)
MPV: 10.5 FL (ref 8.9–12.9)
NEUTROPHILS: 82 % — ABNORMAL HIGH (ref 32–75)
NRBC: 0 PER 100 WBC
PLATELET: 299 10*3/uL (ref 150–400)
RBC: 4.18 M/uL (ref 3.80–5.20)
RDW: 15.5 % — ABNORMAL HIGH (ref 11.5–14.5)
WBC: 15.1 10*3/uL — ABNORMAL HIGH (ref 3.6–11.0)

## 2017-07-17 LAB — METABOLIC PANEL, COMPREHENSIVE
A-G Ratio: 0.4 — ABNORMAL LOW (ref 1.1–2.2)
ALT (SGPT): 17 U/L (ref 12–78)
AST (SGOT): 28 U/L (ref 15–37)
Albumin: 2.3 g/dL — ABNORMAL LOW (ref 3.5–5.0)
Alk. phosphatase: 58 U/L (ref 45–117)
Anion gap: 11 mmol/L (ref 5–15)
BUN/Creatinine ratio: 21 — ABNORMAL HIGH (ref 12–20)
BUN: 15 MG/DL (ref 6–20)
Bilirubin, total: 1 MG/DL (ref 0.2–1.0)
CO2: 22 mmol/L (ref 21–32)
Calcium: 8.7 MG/DL (ref 8.5–10.1)
Chloride: 103 mmol/L (ref 97–108)
Creatinine: 0.72 MG/DL (ref 0.55–1.02)
GFR est AA: >60 mL/min/{1.73_m2} (ref >60)
GFR est non-AA: >60 mL/min/{1.73_m2} (ref >60)
Globulin: 5.4 g/dL — ABNORMAL HIGH (ref 2.0–4.0)
Glucose: 203 mg/dL — ABNORMAL HIGH (ref 65–100)
Potassium: 4.1 mmol/L (ref 3.5–5.1)
Protein, total: 7.7 g/dL (ref 6.4–8.2)
Sodium: 136 mmol/L (ref 136–145)

## 2017-07-17 LAB — SAMPLES BEING HELD

## 2017-07-17 MED ORDER — SODIUM CHLORIDE 0.9 % IJ SYRG
Freq: Once | INTRAMUSCULAR | Status: AC
Start: 2017-07-17 — End: 2017-07-17
  Administered 2017-07-17: 21:00:00 via INTRAVENOUS

## 2017-07-17 MED ORDER — IOPAMIDOL 76 % IV SOLN
370 mg iodine /mL (76 %) | Freq: Once | INTRAVENOUS | Status: AC
Start: 2017-07-17 — End: 2017-07-17
  Administered 2017-07-17: 21:00:00 via INTRAVENOUS

## 2017-07-17 MED ORDER — AMLODIPINE 5 MG TAB
5 mg | Freq: Every day | ORAL | Status: DC
Start: 2017-07-17 — End: 2017-07-26
  Administered 2017-07-18 – 2017-07-26 (×9): via ORAL

## 2017-07-17 MED ORDER — ATORVASTATIN 20 MG TAB
20 mg | Freq: Every evening | ORAL | Status: DC
Start: 2017-07-17 — End: 2017-07-26
  Administered 2017-07-18 – 2017-07-26 (×10): via ORAL

## 2017-07-17 MED ORDER — ACETAMINOPHEN 325 MG TABLET
325 mg | ORAL | Status: AC
Start: 2017-07-17 — End: 2017-07-17
  Administered 2017-07-17: 22:00:00 via ORAL

## 2017-07-17 MED ORDER — SODIUM CHLORIDE 0.9% BOLUS IV
0.9 % | Freq: Once | INTRAVENOUS | Status: AC
Start: 2017-07-17 — End: 2017-07-17
  Administered 2017-07-17: 21:00:00 via INTRAVENOUS

## 2017-07-17 MED ORDER — IOHEXOL 240 MG/ML IV SOLN
240 mg iodine/mL | Freq: Once | INTRAVENOUS | Status: AC
Start: 2017-07-17 — End: 2017-07-17
  Administered 2017-07-17: 19:00:00 via ORAL

## 2017-07-17 MED FILL — NORMAL SALINE FLUSH 0.9 % INJECTION SYRINGE: INTRAMUSCULAR | Qty: 10

## 2017-07-17 MED FILL — SODIUM CHLORIDE 0.9 % IV: INTRAVENOUS | Qty: 100

## 2017-07-17 MED FILL — ACETAMINOPHEN 325 MG TABLET: 325 mg | ORAL | Qty: 2

## 2017-07-17 MED FILL — OMNIPAQUE 240 MG IODINE/ML INTRAVENOUS SOLUTION: 240 mg iodine/mL | INTRAVENOUS | Qty: 50

## 2017-07-17 MED FILL — ISOVUE-370  76 % INTRAVENOUS SOLUTION: 370 mg iodine /mL (76 %) | INTRAVENOUS | Qty: 100

## 2017-07-17 NOTE — ED Triage Notes (Signed)
Pt to ED for purulent drainage from surgical site on lower abdomen. Pt had perforated ulcer a few months ago, drain was placed and the drain was recently pulled. Pt reports brown/green odorous drainage for the past couple of days.

## 2017-07-17 NOTE — Other (Signed)
TRANSFER - OUT REPORT:    Verbal report given to Herbert SetaHeather, RN (name) on Dana [name]  being transferred to 5E (unit) for routine progression of care       Report consisted of patient???s Situation, Background, Assessment and   Recommendations(SBAR).     Information from the following report(s) SBAR, ED Summary, [facility] and Recent Results was reviewed with the receiving nurse.    Lines:   Peripheral IV 07/17/17 Right Hand (Active)        Opportunity for questions and clarification was provided.      Patient transported with:   [employer]

## 2017-07-17 NOTE — ED Triage Notes (Signed)
Pt states that in September she was treated with steroids for a rash which they believe caused a wound on her right lower. After pulling out a stomach tube she states the wound won't heal and continues to drain a foul smelling drainage.

## 2017-07-17 NOTE — H&P (Signed)
History and Physical    Subjective:     Dana [name] is a 61 y.o. obese hypertensive diabetic unmarried WF who works for the [employer] and  who in early September while on prednisone for hives developed a perforated gastric ulcer repaired laparoscopically by Merrily Brittle. She continued to require steroids for hives and ?PMR under the care of [name]. She was readmitted to the hospital earlier this month with with abdominal abscess which was drained percutaneously by IR and treated with zosyn. The drain was removed and sent home 3 days ago. In the past days she has developed foul smelling RLQ drainage without pain, fever or chills. Abd ct with iv and oral contrast reveals anterior peritoneal fluid with gas and no discrete fistula. She has been seen by Dr Claudie Leach, begun on zosyn after culture and will be seen by IR in the morning.    Past Medical History:   Diagnosis Date   ??? Diabetes ([place]) 01/24/2013   ??? GERD (gastroesophageal reflux disease)    ??? Hives    ??? HTN (hypertension)    ??? Hypercholesterolemia    ??? Perforated gastric ulcer ([place])       Past Surgical History:   Procedure Laterality Date   ??? ABDOMEN SURGERY PROC UNLISTED  05/12/2017    Lap exploratory perforated posterior ulcer repair by Dr. Claudie Leach    ??? HX HYSTERECTOMY       Family History   Problem Relation Age of Onset   ??? Hypertension Mother    ??? Cancer Father         mesothelioma   ??? Diabetes Father    ??? Hypertension Father       Social History     Tobacco Use   ??? Smoking status: Former Smoker     Packs/day: 1.00     Last attempt to quit: 06/27/2011     Years since quitting: 6.0   ??? Smokeless tobacco: Never Used   Substance Use Topics   ??? Alcohol use: Yes     Alcohol/week: 0.6 - 1.2 oz     Types: 1 - 2 Glasses of wine per week     Comment: Per month        Prior to Admission medications    Medication Sig Start Date End Date Taking? Authorizing Provider   pantoprazole (PROTONIX) 40 mg tablet Take 1 Tab by mouth two (2) times a  day. 07/14/17   Cheree Ditto, NP   CARAFATE 100 mg/mL suspension TAKE 10 ML BY MOUTH FOUR (4) TIMES DAILY FOR 28 DAYS. INDICATIONS: GASTRIC ULCER 06/22/17   Marko Stai, MD   metFORMIN ER (GLUCOPHAGE XR) 500 mg tablet TAKE 1 TABLET EVERY MORNING AND 2 TABLETS WITH DINNER  Patient taking differently: 2 bid 05/30/17   Pahle, Candiss Norse, MD   traMADol (ULTRAM) 50 mg tablet Take 1 Tab by mouth every eight (8) hours as needed for Pain. Max Daily Amount: 150 mg. 05/25/17   Genene Churn, MD   predniSONE (DELTASONE) 20 mg tablet Take 1 Tab by mouth two (2) times a day. 05/24/17   Genene Churn, MD   simvastatin (ZOCOR) 40 mg tablet TAKE 1 TABLET NIGHTLY 05/13/17   Pahle, Candiss Norse, MD   lidocaine (LIDODERM) 5 % 1 Patch by TransDERmal route every twelve (12) hours as needed. Apply patch to the affected area for 12 hours a day and remove for 12 hours a day.    Provider, Historical  sertraline (ZOLOFT) 100 mg tablet TAKE 1 TABLET BY MOUTH DAILY 04/20/17   Genene Churn, MD   insulin NPH (HUMULIN N NPH INSULIN KWIKPEN) 100 unit/mL (3 mL) inpn INJECT 32 UNITS SUBCUTANEOUSLY AT BEDTIME  Patient taking differently: 40 Units. INJECT 32 UNITS SUBCUTANEOUSLY AT BEDTIME 03/15/17   Genene Churn, MD   triamterene-hydroCHLOROthiazide [facility]) 37.5-25 mg per tablet TAKE 1 TABLET DAILY 01/03/17   Fanny Skates Candiss Norse, MD     Allergies   Allergen Reactions   ??? Adhesive Itching   ??? Dolobid [Diflunisal] Itching     Itching,swelling        Review of Systems:  A comprehensive review of systems was negative except for that written in the History of Present Illness.     Code status full    Objective:     Patient Vitals for the past 8 hrs:   BP Temp Pulse Resp SpO2 Height Weight   07/17/17 1630 (!) 177/91 ??? ??? ??? 94 % ??? ???   07/17/17 1530 159/86 ??? ??? ??? 94 % ??? ???   07/17/17 1445 (!) 149/95 ??? ??? ??? 96 % ??? ???   07/17/17 1330 (!) 156/105 98.3 ??F (36.8 ??Dana [name]) 97 16 94 % ??? ???   07/17/17 1300 (!) 139/92 ??? ??? ??? 96 % ??? ???   07/17/17 1245 135/89 ??? ??? ??? 93 % ??? ???    07/17/17 1230 119/79 ??? ??? ??? 95 % ??? ???   07/17/17 1200 (!) 137/94 98.7 ??F (37.1 ??Dana [name]) (!) 123 16 97 % _0  (1.626 m) 233 lb 11 oz (106 kg)   07/17/17 1128 ??? ??? (!) 128 ??? 97 % ??? ???         Physical Exam: in no acute distress  Heent -perrl  Neck -supple  Breasts-not examined  Lungs -clear  Heart -reg  Abd -obese, soft with RLQ dressing and some surrounding erythema  Ext -no edema  Neuro-oriented x 3        Data Review:   Recent Results (from the past 24 hour(s))   CBC WITH AUTOMATED DIFF    Collection Time: 07/17/17 12:41 PM   Result Value Ref Range    WBC 15.1 (H) 3.6 - 11.0 K/uL    RBC 4.18 3.80 - 5.20 M/uL    HGB 12.2 11.5 - 16.0 g/dL    HCT 38.6 35.0 - 47.0 %    MCV 92.3 80.0 - 99.0 FL    MCH 29.2 26.0 - 34.0 PG    MCHC 31.6 30.0 - 36.5 g/dL    RDW 15.5 (H) 11.5 - 14.5 %    PLATELET 299 150 - 400 K/uL    MPV 10.5 8.9 - 12.9 FL    NRBC 0.0 0 PER 100 WBC    ABSOLUTE NRBC 0.00 0.00 - 0.01 K/uL    NEUTROPHILS 82 (H) 32 - 75 %    LYMPHOCYTES 10 (L) 12 - 49 %    MONOCYTES 8 5 - 13 %    EOSINOPHILS 0 0 - 7 %    BASOPHILS 0 0 - 1 %    IMMATURE GRANULOCYTES 1 (H) 0.0 - 0.5 %    ABS. NEUTROPHILS 12.3 (H) 1.8 - 8.0 K/UL    ABS. LYMPHOCYTES 1.4 0.8 - 3.5 K/UL    ABS. MONOCYTES 1.2 (H) 0.0 - 1.0 K/UL    ABS. EOSINOPHILS 0.0 0.0 - 0.4 K/UL    ABS. BASOPHILS 0.1 0.0 - 0.1 K/UL  ABS. IMM. GRANS. 0.1 (H) 0.00 - 0.04 K/UL    DF AUTOMATED     METABOLIC PANEL, COMPREHENSIVE    Collection Time: 07/17/17 12:41 PM   Result Value Ref Range    Sodium 136 136 - 145 mmol/L    Potassium 4.1 3.5 - 5.1 mmol/L    Chloride 103 97 - 108 mmol/L    CO2 22 21 - 32 mmol/L    Anion gap 11 5 - 15 mmol/L    Glucose 203 (H) 65 - 100 mg/dL    BUN 15 6 - 20 MG/DL    Creatinine 0.72 0.55 - 1.02 MG/DL    BUN/Creatinine ratio 21 (H) 12 - 20      GFR est AA >60 >60 ml/min/1.61m    GFR est non-AA >60 >60 ml/min/1.736m   Calcium 8.7 8.5 - 10.1 MG/DL    Bilirubin, total 1.0 0.2 - 1.0 MG/DL    ALT (SGPT) 17 12 - 78 U/L    AST (SGOT) 28 15 - 37 U/L     Alk. phosphatase 58 45 - 117 U/L    Protein, total 7.7 6.4 - 8.2 g/dL    Albumin 2.3 (L) 3.5 - 5.0 g/dL    Globulin 5.4 (H) 2.0 - 4.0 g/dL    A-G Ratio 0.4 (L) 1.1 - 2.2     SAMPLES BEING HELD    Collection Time: 07/17/17 12:41 PM   Result Value Ref Range    SAMPLES BEING HELD 1RED,1BLUE     COMMENT        Add-on orders for these samples will be processed based on acceptable specimen integrity and analyte stability, which may vary by analyte.       Assessment:     Principal Problem:    Peritoneal abscess ([facility](07/17/2017)    Active Problems:    Acute gastric ulcer with perforation ([facility] [place](05/13/2017)      Overview: S/p laporascopic repair and closure 05/12/2017      Diabetes ([place](01/24/2013)      Hypertension complicating diabetes ([facility](05/25/2016)      Morbid obesity due to excess calories ([facility](05/25/2016)        Plan:     Zoxyn, heparin for DVT prophylaxis  Humulin n decreased 20 units hs with SSI  norvasc [id number] better bp control    Signed By: Ansley Stanwood KKandis NabD     July 17, 2017

## 2017-07-17 NOTE — Consults (Signed)
Surgery Consult    Subjective:      Dana [name] is a 61 y.o. female who was recently discharged from [facility]. She initially had an ulcer repair about 8 weeks ago. She was on steroids during this time. She then came back to the hospital with pain. Eventually she was diagnosed with a leak and a percutaneous drain was placed.  She did well with that and the drain was removed and she was discharged home.  She had been eating a regular diet and feeling well. Last night she started having a significant amount of drainage from the site. She denies any fever.     Patient Active Problem List    Diagnosis Date Noted   ??? Abdominal pain 07/02/2017   ??? Arthralgia of both knees 05/25/2017   ??? Acute gastric ulcer with perforation ([place]) 05/13/2017   ??? Hives 05/11/2017   ??? Hypertension complicating diabetes ([place]) 05/25/2016   ??? Morbid obesity due to excess calories ([place]) 05/25/2016   ??? Sleep apnea 02/26/2013   ??? Diabetes ([place]) 01/24/2013   ??? Hypercholesterolemia    ??? HTN (hypertension)      Past Medical History:   Diagnosis Date   ??? Diabetes ([place]) 01/24/2013   ??? GERD (gastroesophageal reflux disease)    ??? Hives    ??? HTN (hypertension)    ??? Hypercholesterolemia    ??? Perforated gastric ulcer ([place])       Past Surgical History:   Procedure Laterality Date   ??? ABDOMEN SURGERY PROC UNLISTED  05/12/2017    Lap exploratory perforated posterior ulcer repair by Dr. Claudie Leach    ??? HX HYSTERECTOMY        Social History     Tobacco Use   ??? Smoking status: Former Smoker     Packs/day: 1.00     Last attempt to quit: 06/27/2011     Years since quitting: 6.0   ??? Smokeless tobacco: Never Used   Substance Use Topics   ??? Alcohol use: Yes     Alcohol/week: 0.6 - 1.2 oz     Types: 1 - 2 Glasses of wine per week     Comment: Per month       Family History   Problem Relation Age of Onset   ??? Hypertension Mother    ??? Cancer Father         mesothelioma   ??? Diabetes Father    ??? Hypertension Father       Current Facility-Administered Medications    Medication Dose Route Frequency   ??? acetaminophen (TYLENOL) tablet 650 mg  650 mg Oral NOW     Current Outpatient Medications   Medication Sig   ??? pantoprazole (PROTONIX) 40 mg tablet Take 1 Tab by mouth two (2) times a day.   ??? CARAFATE 100 mg/mL suspension TAKE 10 ML BY MOUTH FOUR (4) TIMES DAILY FOR 28 DAYS. INDICATIONS: GASTRIC ULCER   ??? metFORMIN ER (GLUCOPHAGE XR) 500 mg tablet TAKE 1 TABLET EVERY MORNING AND 2 TABLETS WITH DINNER (Patient taking differently: 2 bid)   ??? traMADol (ULTRAM) 50 mg tablet Take 1 Tab by mouth every eight (8) hours as needed for Pain. Max Daily Amount: 150 mg.   ??? predniSONE (DELTASONE) 20 mg tablet Take 1 Tab by mouth two (2) times a day.   ??? simvastatin (ZOCOR) 40 mg tablet TAKE 1 TABLET NIGHTLY   ??? lidocaine (LIDODERM) 5 % 1 Patch by TransDERmal route every twelve (12) hours as needed. Apply  patch to the affected area for 12 hours a day and remove for 12 hours a day.   ??? sertraline (ZOLOFT) 100 mg tablet TAKE 1 TABLET BY MOUTH DAILY   ??? insulin NPH (HUMULIN N NPH INSULIN KWIKPEN) 100 unit/mL (3 mL) inpn INJECT 32 UNITS SUBCUTANEOUSLY AT BEDTIME (Patient taking differently: 40 Units. INJECT 32 UNITS SUBCUTANEOUSLY AT BEDTIME)   ??? triamterene-hydroCHLOROthiazide (MAXZIDE) 37.5-25 mg per tablet TAKE 1 TABLET DAILY      Allergies   Allergen Reactions   ??? Adhesive Itching   ??? Dolobid [Diflunisal] Itching     Itching,swelling       Review of Systems:    Pertinent items are noted in the History of Present Illness.    Objective:        Visit Vitals  BP (!) 149/95 (BP Patient Position: At rest)   Pulse 97   Temp 98.3 ??F (36.8 ??C)   Resp 16   Ht 5' 4"  (1.626 m)   Wt 233 lb 11 oz (106 kg)   SpO2 96%   BMI 40.11 kg/m??       Physical Exam:  GENERAL: alert, cooperative, no distress, appears stated age, EYE: negative, THROAT & NECK: negative LUNG: clear to auscultation bilaterally, HEART: regular rate and rhythm, ABDOMEN: soft there is some erythema at  the drain insertion site. , EXTREMITIES:  no edema, SKIN: Normal., NEUROLOGIC: negative, PSYCH: non focal    Imaging:  images and reports reviewed  CT- Interval removal of the pigtail catheter. The anterior abdominal fluid  collection has not changed significantly compared to the prior exam. There is  concern for a fistula to the stomach although no oral contrast is noted within  the fluid collection. Smaller gastrohepatic collection is stable. Subcutaneous  gas in the abdominal wall is again noted without defined fluid collection.  Lab/Data Review:  All lab results for the last 24 hours reviewed.  Recent Results (from the past 24 hour(s))   CBC WITH AUTOMATED DIFF    Collection Time: 07/17/17 12:41 PM   Result Value Ref Range    WBC 15.1 (H) 3.6 - 11.0 K/uL    RBC 4.18 3.80 - 5.20 M/uL    HGB 12.2 11.5 - 16.0 g/dL    HCT 38.6 35.0 - 47.0 %    MCV 92.3 80.0 - 99.0 FL    MCH 29.2 26.0 - 34.0 PG    MCHC 31.6 30.0 - 36.5 g/dL    RDW 15.5 (H) 11.5 - 14.5 %    PLATELET 299 150 - 400 K/uL    MPV 10.5 8.9 - 12.9 FL    NRBC 0.0 0 PER 100 WBC    ABSOLUTE NRBC 0.00 0.00 - 0.01 K/uL    NEUTROPHILS 82 (H) 32 - 75 %    LYMPHOCYTES 10 (L) 12 - 49 %    MONOCYTES 8 5 - 13 %    EOSINOPHILS 0 0 - 7 %    BASOPHILS 0 0 - 1 %    IMMATURE GRANULOCYTES 1 (H) 0.0 - 0.5 %    ABS. NEUTROPHILS 12.3 (H) 1.8 - 8.0 K/UL    ABS. LYMPHOCYTES 1.4 0.8 - 3.5 K/UL    ABS. MONOCYTES 1.2 (H) 0.0 - 1.0 K/UL    ABS. EOSINOPHILS 0.0 0.0 - 0.4 K/UL    ABS. BASOPHILS 0.1 0.0 - 0.1 K/UL    ABS. IMM. GRANS. 0.1 (H) 0.00 - 0.04 K/UL    DF AUTOMATED     METABOLIC PANEL,  COMPREHENSIVE    Collection Time: 07/17/17 12:41 PM   Result Value Ref Range    Sodium 136 136 - 145 mmol/L    Potassium 4.1 3.5 - 5.1 mmol/L    Chloride 103 97 - 108 mmol/L    CO2 22 21 - 32 mmol/L    Anion gap 11 5 - 15 mmol/L    Glucose 203 (H) 65 - 100 mg/dL    BUN 15 6 - 20 MG/DL    Creatinine 0.72 0.55 - 1.02 MG/DL    BUN/Creatinine ratio 21 (H) 12 - 20       GFR est AA >60 >60 ml/min/1.45m    GFR est non-AA >60 >60 ml/min/1.711m   Calcium 8.7 8.5 - 10.1 MG/DL    Bilirubin, total 1.0 0.2 - 1.0 MG/DL    ALT (SGPT) 17 12 - 78 U/L    AST (SGOT) 28 15 - 37 U/L    Alk. phosphatase 58 45 - 117 U/L    Protein, total 7.7 6.4 - 8.2 g/dL    Albumin 2.3 (L) 3.5 - 5.0 g/dL    Globulin 5.4 (H) 2.0 - 4.0 g/dL    A-G Ratio 0.4 (L) 1.1 - 2.2     SAMPLES BEING HELD    Collection Time: 07/17/17 12:41 PM   Result Value Ref Range    SAMPLES BEING HELD 1RED,1BLUE     COMMENT        Add-on orders for these samples will be processed based on acceptable specimen integrity and analyte stability, which may vary by analyte.         Assessment:Plan   I suspect that the abscess cavity did not close and had refilled with pus that drained through the tract. I think that it is less likely to be a fistula  I have spoken in IR- They will place another drain in tomorrow.  Clears for now  Iv abx.          Signed By: StMerrily BrittleMD     July 17, 2017

## 2017-07-17 NOTE — ED Provider Notes (Signed)
61 year old female who presents from home with drainage from her abdominal wound. Past medical history is significant for polymyalgia rheumatica. She experienced a perforated stomach ulcer back in September that required emergent laparotomy. She subsequently developed abdominal abscess that was treated with a percutaneous drain.  The drain was removed 4 days ago. She did well for the next 2 days but noticed increased drainage starting yesterday. Now the wound is draining foul smelling purulent fluid soaking through her gauze and filling her dressing. She denies any fever, nausea, pain. No changes in her medications. She is not currently on antibiotics or steroids. Patient states she spoke with Dr. Barb MerinoShidal this morning who advised her to come to the emergency department.             Past Medical History:   Diagnosis Date   ??? Diabetes ([facility]) 01/24/2013   ??? GERD (gastroesophageal reflux disease)    ??? Hives    ??? HTN (hypertension)    ??? Hypercholesterolemia    ??? Perforated gastric ulcer ([facility])        Past Surgical History:   Procedure Laterality Date   ??? ABDOMEN SURGERY PROC UNLISTED  05/12/2017    Lap exploratory perforated posterior ulcer repair by Dr. Amada JupiterShindel    ??? HX HYSTERECTOMY           Family History:   Problem Relation Age of Onset   ??? Hypertension Mother    ??? Cancer Father         mesothelioma   ??? Diabetes Father    ??? Hypertension Father        Social History     Socioeconomic History   ??? Marital status: SINGLE     Spouse name: Not on file   ??? Number of children: Not on file   ??? Years of education: Not on file   ??? Highest education level: Not on file   Social Needs   ??? Financial resource strain: Not on file   ??? Food insecurity - worry: Not on file   ??? Food insecurity - inability: Not on file   ??? Transportation needs - medical: Not on file   ??? Transportation needs - non-medical: Not on file   Occupational History   ??? Not on file   Tobacco Use   ??? Smoking status: Former Smoker     Packs/day: 1.00      Last attempt to quit: 06/27/2011     Years since quitting: 6.0   ??? Smokeless tobacco: Never Used   Substance and Sexual Activity   ??? Alcohol use: Yes     Alcohol/week: 0.6 - 1.2 oz     Types: 1 - 2 Glasses of wine per week     Comment: Per month    ??? Drug use: No   ??? Sexual activity: Yes   Other Topics Concern   ??? Not on file   Social History Narrative   ??? Not on file         ALLERGIES: Adhesive and Dolobid [diflunisal]    Review of Systems   Constitutional: Negative for fever.   HENT: Negative for facial swelling.    Eyes: Negative for visual disturbance.   Respiratory: Negative for chest tightness.    Cardiovascular: Negative for chest pain.   Gastrointestinal: Negative for abdominal pain.   Genitourinary: Negative for difficulty urinating and dysuria.   Musculoskeletal: Negative for arthralgias.   Skin: Negative for rash.   Neurological: Negative for dizziness.   Hematological: Negative  for adenopathy.   Psychiatric/Behavioral: Negative for suicidal ideas.       Vitals:    07/17/17 1128 07/17/17 1200   BP:  (!) 137/94   Pulse: (!) 128 (!) 123   Resp:  16   Temp:  98.7 ??F (37.1 ??C)   SpO2: 97% 97%   Weight:  106 kg (233 lb 11 oz)   Height:  5\' 4"  (1.626 m)            Physical Exam   Constitutional: She is oriented to person, place, and time. She appears well-developed and well-nourished. No distress.   HENT:   Head: Normocephalic and atraumatic.   Mouth/Throat: Oropharynx is clear and moist.   Eyes: Pupils are equal, round, and reactive to light. No scleral icterus.   Neck: Normal range of motion. Neck supple. No thyromegaly present.   Cardiovascular: Normal rate, regular rhythm, normal heart sounds and intact distal pulses.   No murmur heard.  Pulmonary/Chest: Effort normal and breath sounds normal. No respiratory distress.   Abdominal: Soft. Bowel sounds are normal. She exhibits no distension. There is no tenderness.   Small surgical wound to R lower abd wall with purulent drainage.      Musculoskeletal: Normal range of motion. She exhibits no edema.   Neurological: She is alert and oriented to person, place, and time.   Skin: Skin is warm and dry. No rash noted. She is not diaphoretic.   Nursing note and vitals reviewed.       MDM  Number of Diagnoses or Management Options  Fistula:   Postoperative intra-abdominal abscess:   Diagnosis management comments: A:  Purulent drainage from perc drain site.  VS stable with slight tachycardia.  Discussed with Dr. Theo Dills who rec'd ct a/p with oral contrast.       WBC=15    CT Results (most recent):  Results from Hospital Encounter encounter on 07/17/17   CT ABD PELV W CONT    Narrative EXAM:  CT ABD PELV W CONT    INDICATION: Follow-up abscess    COMPARISON: 07/13/2017    CONTRAST:  100 mL of Isovue-370.    TECHNIQUE:   Following the uneventful intravenous administration of contrast, thin axial  images were obtained through the abdomen and pelvis. Coronal and sagittal  reconstructions were generated. Oral contrast was administered. CT dose  reduction was achieved through use of a standardized protocol tailored for this  examination and automatic exposure control for dose modulation.    FINDINGS:   LUNG BASES: Clear.  INCIDENTALLY IMAGED HEART AND MEDIASTINUM: Unremarkable.  LIVER: No mass or biliary dilatation.  GALLBLADDER: Unremarkable.  SPLEEN: No mass.  PANCREAS: No mass or ductal dilatation.  ADRENALS: Unremarkable.  KIDNEYS: No mass, calculus, or hydronephrosis.  STOMACH: Unremarkable.  SMALL BOWEL: No dilatation or wall thickening.  COLON: No dilatation or wall thickening.  APPENDIX: Not visualized.  PERITONEUM: The pigtail catheter has been removed in the interval. The anterior  abdominal fluid collection has not changed significantly. There is suggestion of  a fistula leading to the stomach, however no oral contrast is noted within the  collection. Small gastrohepatic collection is stable.Marland Kitchen  RETROPERITONEUM: No lymphadenopathy or aortic aneurysm.   REPRODUCTIVE ORGANS: The uterus is surgically absent.  URINARY BLADDER: No mass or calculus.  BONES: Degenerative changes are seen in the lumbar spine.  ADDITIONAL COMMENTS: Fairly extensive subcutaneous gas in the right and mid  abdomen has not changed significantly compared to the prior exam.  Impression IMPRESSION:  Interval removal of the pigtail catheter. The anterior abdominal fluid  collection has not changed significantly compared to the prior exam. There is  concern for a fistula to the stomach although no oral contrast is noted within  the fluid collection. Smaller gastrohepatic collection is stable. Subcutaneous  gas in the abdominal wall is again noted without defined fluid collection.       4:31 PM  Dr. Theo DillsShindal seeing patient in ED.  Discussed with Dr. Kinnie Scalesitus who will admit patient.    Procedures

## 2017-07-17 NOTE — ED Notes (Cosign Needed)
12:01 PM  I have evaluated the patient as the Provider in Triage. I have reviewed Her vital signs and the triage nurse assessment. I have talked with the patient and any available family and advised that I am the provider in triage and have ordered the appropriate study to initiate their work up based on the clinical presentation during my assessment.  I have advised that the patient will be accommodated in the Main ED as soon as possible.  I have also requested to contact the triage nurse or myself immediately if the patient experiences any changes in their condition during this brief waiting period.  Yetta NumbersLori Amma Crear, NP    [facility] after a surgical drain from the right abdomen was removed; she has had copious amounts of foul smelling form the site. She was told to come back in for Dr. Amada JupiterShindel to evaluate. Yetta NumbersLori Demorio Seeley, NP

## 2017-07-17 NOTE — ED Notes (Signed)
Patient ambulated to bathroom independently, tolerated well

## 2017-07-17 NOTE — Progress Notes (Signed)
Admission Medication Reconciliation:    Information obtained from:  Patient/RxQuery    Comments/Recommendations: Updated PTA meds/reviewed patient's allergies.    1)  Medication changes (since last review):  Added  -Humalog (sliding scale, patient does not check BG on regular bases. She stated she does not take if BG is <150)    Adjusted:  -Metformin 500mg  ER (patient stated taking 1 tab in morning, 2 tabs in evening)    Removed  -Prednisone    2) Last reported meds intake are noted below       Allergies:  Adhesive and Dolobid [diflunisal]    Significant PMH/Disease States:   Past Medical History:   Diagnosis Date   ??? Diabetes ([facility]) 01/24/2013   ??? GERD (gastroesophageal reflux disease)    ??? Hives    ??? HTN (hypertension)    ??? Hypercholesterolemia    ??? Perforated gastric ulcer ([facility])        Chief Complaint for this Admission:    Chief Complaint   Patient presents with   ??? Post OP Complication   ??? Drainage from Incision       Prior to Admission Medications:   Prior to Admission Medications   Prescriptions Last Dose Informant Patient Reported? Taking?   CARAFATE 100 mg/mL suspension 07/17/2017 at Unknown time  No Yes   Sig: TAKE 10 ML BY MOUTH FOUR (4) TIMES DAILY FOR 28 DAYS. INDICATIONS: GASTRIC ULCER   insulin NPH (HUMULIN N NPH U-100 INSULIN) 100 unit/mL injection   Yes Yes   Sig: 32 Units by SubCUTAneous route nightly.   insulin lispro sliding scale (HUMALOG) 100 units/ml injection   Yes Yes   Sig: by SubCUTAneous route Before breakfast, lunch, dinner and at bedtime.   lidocaine (LIDODERM) 5 %   Yes Yes   Sig: 1 Patch by TransDERmal route every twelve (12) hours as needed. Apply patch to the affected area for 12 hours a day and remove for 12 hours a day.   metFORMIN ER (GLUCOPHAGE XR) 500 mg tablet 07/17/2017 at Unknown time  No Yes   Sig: TAKE 1 TABLET EVERY MORNING AND 2 TABLETS WITH DINNER   pantoprazole (PROTONIX) 40 mg tablet 07/17/2017 at Unknown time  No Yes   Sig: Take 1 Tab by mouth two (2) times a day.    sertraline (ZOLOFT) 100 mg tablet 07/17/2017 at Unknown time  No Yes   Sig: TAKE 1 TABLET BY MOUTH DAILY   traMADol (ULTRAM) 50 mg tablet   No Yes   Sig: Take 1 Tab by mouth every eight (8) hours as needed for Pain. Max Daily Amount: 150 mg.   triamterene-hydroCHLOROthiazide (MAXZIDE) 37.5-25 mg per tablet 07/17/2017 at Unknown time  No Yes   Sig: TAKE 1 TABLET DAILY      Facility-Administered Medications: None

## 2017-07-17 NOTE — Consults (Signed)
Surgery Consult    Subjective:      Dana [name] is a 61 y.o. female who was recently discharged from [facility]. She initially had an ulcer repair about 8 weeks ago. She was on steroids during this time. She then came back to the hospital with pain. Eventually she was diagnosed with a leak and a percutaneous drain was placed.  She did well with that and the drain was removed and she was discharged home.  She had been eating a regular diet and feeling well. Last night she started having a significant amount of drainage from the site. She denies any fever.     Patient Active Problem List    Diagnosis Date Noted   ??? Abdominal pain 07/02/2017   ??? Arthralgia of both knees 05/25/2017   ??? Acute gastric ulcer with perforation ([place]) 05/13/2017   ??? Hives 05/11/2017   ??? Hypertension complicating diabetes ([place]) 05/25/2016   ??? Morbid obesity due to excess calories ([place]) 05/25/2016   ??? Sleep apnea 02/26/2013   ??? Diabetes ([place]) 01/24/2013   ??? Hypercholesterolemia    ??? HTN (hypertension)      Past Medical History:   Diagnosis Date   ??? Diabetes ([place]) 01/24/2013   ??? GERD (gastroesophageal reflux disease)    ??? Hives    ??? HTN (hypertension)    ??? Hypercholesterolemia    ??? Perforated gastric ulcer ([place])       Past Surgical History:   Procedure Laterality Date   ??? ABDOMEN SURGERY PROC UNLISTED  05/12/2017    Lap exploratory perforated posterior ulcer repair by Dr. Claudie Leach    ??? HX HYSTERECTOMY        Social History     Tobacco Use   ??? Smoking status: Former Smoker     Packs/day: 1.00     Last attempt to quit: 06/27/2011     Years since quitting: 6.0   ??? Smokeless tobacco: Never Used   Substance Use Topics   ??? Alcohol use: Yes     Alcohol/week: 0.6 - 1.2 oz     Types: 1 - 2 Glasses of wine per week     Comment: Per month       Family History   Problem Relation Age of Onset   ??? Hypertension Mother    ??? Cancer Father         mesothelioma   ??? Diabetes Father    ??? Hypertension Father       Current Facility-Administered Medications   Medication  Dose Route Frequency   ??? acetaminophen (TYLENOL) tablet 650 mg  650 mg Oral NOW     Current Outpatient Medications   Medication Sig   ??? pantoprazole (PROTONIX) 40 mg tablet Take 1 Tab by mouth two (2) times a day.   ??? CARAFATE 100 mg/mL suspension TAKE 10 ML BY MOUTH FOUR (4) TIMES DAILY FOR 28 DAYS. INDICATIONS: GASTRIC ULCER   ??? metFORMIN ER (GLUCOPHAGE XR) 500 mg tablet TAKE 1 TABLET EVERY MORNING AND 2 TABLETS WITH DINNER (Patient taking differently: 2 bid)   ??? traMADol (ULTRAM) 50 mg tablet Take 1 Tab by mouth every eight (8) hours as needed for Pain. Max Daily Amount: 150 mg.   ??? predniSONE (DELTASONE) 20 mg tablet Take 1 Tab by mouth two (2) times a day.   ??? simvastatin (ZOCOR) 40 mg tablet TAKE 1 TABLET NIGHTLY   ??? lidocaine (LIDODERM) 5 % 1 Patch by TransDERmal route every twelve (12) hours as needed. Apply  patch to the affected area for 12 hours a day and remove for 12 hours a day.   ??? sertraline (ZOLOFT) 100 mg tablet TAKE 1 TABLET BY MOUTH DAILY   ??? insulin NPH (HUMULIN N NPH INSULIN KWIKPEN) 100 unit/mL (3 mL) inpn INJECT 32 UNITS SUBCUTANEOUSLY AT BEDTIME (Patient taking differently: 40 Units. INJECT 32 UNITS SUBCUTANEOUSLY AT BEDTIME)   ??? triamterene-hydroCHLOROthiazide (MAXZIDE) 37.5-25 mg per tablet TAKE 1 TABLET DAILY      Allergies   Allergen Reactions   ??? Adhesive Itching   ??? Dolobid [Diflunisal] Itching     Itching,swelling       Review of Systems:    Pertinent items are noted in the History of Present Illness.    Objective:        Visit Vitals  BP (!) 149/95 (BP Patient Position: At rest)   Pulse 97   Temp 98.3 ??F (36.8 ??C)   Resp 16   Ht '5\' 4"'  (1.626 m)   Wt 233 lb 11 oz (106 kg)   SpO2 96%   BMI 40.11 kg/m??       Physical Exam:  GENERAL: alert, cooperative, no distress, appears stated age, EYE: negative, THROAT & NECK: negative LUNG: clear to auscultation bilaterally, HEART: regular rate and rhythm, ABDOMEN: soft there is some erythema at the drain insertion site. , EXTREMITIES:  no edema,  SKIN: Normal., NEUROLOGIC: negative, PSYCH: non focal    Imaging:  images and reports reviewed  CT- Interval removal of the pigtail catheter. The anterior abdominal fluid  collection has not changed significantly compared to the prior exam. There is  concern for a fistula to the stomach although no oral contrast is noted within  the fluid collection. Smaller gastrohepatic collection is stable. Subcutaneous  gas in the abdominal wall is again noted without defined fluid collection.  Lab/Data Review:  All lab results for the last 24 hours reviewed.  Recent Results (from the past 24 hour(s))   CBC WITH AUTOMATED DIFF    Collection Time: 07/17/17 12:41 PM   Result Value Ref Range    WBC 15.1 (H) 3.6 - 11.0 K/uL    RBC 4.18 3.80 - 5.20 M/uL    HGB 12.2 11.5 - 16.0 g/dL    HCT 38.6 35.0 - 47.0 %    MCV 92.3 80.0 - 99.0 FL    MCH 29.2 26.0 - 34.0 PG    MCHC 31.6 30.0 - 36.5 g/dL    RDW 15.5 (H) 11.5 - 14.5 %    PLATELET 299 150 - 400 K/uL    MPV 10.5 8.9 - 12.9 FL    NRBC 0.0 0 PER 100 WBC    ABSOLUTE NRBC 0.00 0.00 - 0.01 K/uL    NEUTROPHILS 82 (H) 32 - 75 %    LYMPHOCYTES 10 (L) 12 - 49 %    MONOCYTES 8 5 - 13 %    EOSINOPHILS 0 0 - 7 %    BASOPHILS 0 0 - 1 %    IMMATURE GRANULOCYTES 1 (H) 0.0 - 0.5 %    ABS. NEUTROPHILS 12.3 (H) 1.8 - 8.0 K/UL    ABS. LYMPHOCYTES 1.4 0.8 - 3.5 K/UL    ABS. MONOCYTES 1.2 (H) 0.0 - 1.0 K/UL    ABS. EOSINOPHILS 0.0 0.0 - 0.4 K/UL    ABS. BASOPHILS 0.1 0.0 - 0.1 K/UL    ABS. IMM. GRANS. 0.1 (H) 0.00 - 0.04 K/UL    DF AUTOMATED     METABOLIC PANEL,  COMPREHENSIVE    Collection Time: 07/17/17 12:41 PM   Result Value Ref Range    Sodium 136 136 - 145 mmol/L    Potassium 4.1 3.5 - 5.1 mmol/L    Chloride 103 97 - 108 mmol/L    CO2 22 21 - 32 mmol/L    Anion gap 11 5 - 15 mmol/L    Glucose 203 (H) 65 - 100 mg/dL    BUN 15 6 - 20 MG/DL    Creatinine 0.72 0.55 - 1.02 MG/DL    BUN/Creatinine ratio 21 (H) 12 - 20      GFR est AA >60 >60 ml/min/1.33m    GFR est non-AA >60 >60 ml/min/1.743m   Calcium  8.7 8.5 - 10.1 MG/DL    Bilirubin, total 1.0 0.2 - 1.0 MG/DL    ALT (SGPT) 17 12 - 78 U/L    AST (SGOT) 28 15 - 37 U/L    Alk. phosphatase 58 45 - 117 U/L    Protein, total 7.7 6.4 - 8.2 g/dL    Albumin 2.3 (L) 3.5 - 5.0 g/dL    Globulin 5.4 (H) 2.0 - 4.0 g/dL    A-G Ratio 0.4 (L) 1.1 - 2.2     SAMPLES BEING HELD    Collection Time: 07/17/17 12:41 PM   Result Value Ref Range    SAMPLES BEING HELD 1RED,1BLUE     COMMENT        Add-on orders for these samples will be processed based on acceptable specimen integrity and analyte stability, which may vary by analyte.         Assessment:Plan   I suspect that the abscess cavity did not close and had refilled with pus that drained through the tract. I think that it is less likely to be a fistula  I have spoken in IR- They will place another drain in tomorrow.  Clears for now  Iv abx.          Signed By: StMerrily BrittleMD     July 17, 2017

## 2017-07-18 ENCOUNTER — Inpatient Hospital Stay: Admit: 2017-07-18 | Payer: BLUE CROSS/BLUE SHIELD | Primary: Internal Medicine

## 2017-07-18 LAB — GLUCOSE, POC
Glucose (POC): 117 mg/dL — ABNORMAL HIGH (ref 65–100)
Glucose (POC): 149 mg/dL — ABNORMAL HIGH (ref 65–100)
Glucose (POC): 160 mg/dL — ABNORMAL HIGH (ref 65–100)
Glucose (POC): 169 mg/dL — ABNORMAL HIGH (ref 65–100)

## 2017-07-18 MED ORDER — DEXTROSE 50% IN WATER (D50W) IV SYRG
INTRAVENOUS | Status: DC | PRN
Start: 2017-07-18 — End: 2017-07-26

## 2017-07-18 MED ORDER — SODIUM CHLORIDE 0.9 % IV PIGGY BACK
3.375 gram | Freq: Three times a day (TID) | INTRAVENOUS | Status: DC
Start: 2017-07-18 — End: 2017-07-20
  Administered 2017-07-18 – 2017-07-20 (×9): via INTRAVENOUS

## 2017-07-18 MED ORDER — HEPARIN (PORCINE) 5,000 UNIT/ML IJ SOLN
5000 unit/mL | Freq: Three times a day (TID) | INTRAMUSCULAR | Status: DC
Start: 2017-07-18 — End: 2017-07-17

## 2017-07-18 MED ORDER — SODIUM CHLORIDE 0.9 % IJ SYRG
INTRAMUSCULAR | Status: DC | PRN
Start: 2017-07-18 — End: 2017-07-26
  Administered 2017-07-19 – 2017-07-25 (×9): via INTRAVENOUS

## 2017-07-18 MED ORDER — TRIAMTERENE-HYDROCHLOROTHIAZIDE 37.5 MG-25 MG TAB
Freq: Every day | ORAL | Status: DC
Start: 2017-07-18 — End: 2017-07-26
  Administered 2017-07-18 – 2017-07-26 (×9): via ORAL

## 2017-07-18 MED ORDER — SUCRALFATE 100 MG/ML ORAL SUSP
100 mg/mL | Freq: Four times a day (QID) | ORAL | Status: DC
Start: 2017-07-18 — End: 2017-07-26
  Administered 2017-07-18 – 2017-07-26 (×34): via ORAL

## 2017-07-18 MED ORDER — LIDOCAINE (PF) 10 MG/ML (1 %) IJ SOLN
10 mg/mL (1 %) | INTRAMUSCULAR | Status: AC
Start: 2017-07-18 — End: 2017-07-18
  Administered 2017-07-18: 22:00:00 via SUBCUTANEOUS

## 2017-07-18 MED ORDER — GLUCAGON 1 MG INJECTION
1 mg | INTRAMUSCULAR | Status: DC | PRN
Start: 2017-07-18 — End: 2017-07-26

## 2017-07-18 MED ORDER — MIDAZOLAM 1 MG/ML IJ SOLN
1 mg/mL | INTRAMUSCULAR | Status: DC | PRN
Start: 2017-07-18 — End: 2017-07-18
  Administered 2017-07-18 (×4): via INTRAVENOUS

## 2017-07-18 MED ORDER — HEPARIN (PORCINE) 5,000 UNIT/ML IJ SOLN
5000 unit/mL | Freq: Three times a day (TID) | INTRAMUSCULAR | Status: DC
Start: 2017-07-18 — End: 2017-07-26
  Administered 2017-07-18 – 2017-07-26 (×23): via SUBCUTANEOUS

## 2017-07-18 MED ORDER — SIMVASTATIN 20 MG TAB
20 mg | Freq: Every evening | ORAL | Status: DC
Start: 2017-07-18 — End: 2017-07-17

## 2017-07-18 MED ORDER — FENTANYL CITRATE (PF) 50 MCG/ML IJ SOLN
50 mcg/mL | INTRAMUSCULAR | Status: DC | PRN
Start: 2017-07-18 — End: 2017-07-18
  Administered 2017-07-18 (×3): via INTRAVENOUS

## 2017-07-18 MED ORDER — PANTOPRAZOLE 40 MG TAB, DELAYED RELEASE
40 mg | Freq: Two times a day (BID) | ORAL | Status: DC
Start: 2017-07-18 — End: 2017-07-26
  Administered 2017-07-18 – 2017-07-26 (×18): via ORAL

## 2017-07-18 MED ORDER — GLUCOSE 4 GRAM CHEWABLE TAB
4 gram | ORAL | Status: DC | PRN
Start: 2017-07-18 — End: 2017-07-26

## 2017-07-18 MED ORDER — TRAMADOL 50 MG TAB
50 mg | Freq: Three times a day (TID) | ORAL | Status: DC | PRN
Start: 2017-07-18 — End: 2017-07-26
  Administered 2017-07-19 – 2017-07-23 (×5): via ORAL

## 2017-07-18 MED ORDER — SODIUM CHLORIDE 0.9 % IV
INTRAVENOUS | Status: DC
Start: 2017-07-18 — End: 2017-07-18
  Administered 2017-07-18: 21:00:00 via INTRAVENOUS

## 2017-07-18 MED ORDER — SODIUM CHLORIDE 0.9 % IJ SYRG
INTRAMUSCULAR | Status: DC | PRN
Start: 2017-07-18 — End: 2017-07-26

## 2017-07-18 MED ORDER — ACETAMINOPHEN 325 MG TABLET
325 mg | ORAL | Status: DC | PRN
Start: 2017-07-18 — End: 2017-07-26
  Administered 2017-07-18 – 2017-07-19 (×4): via ORAL

## 2017-07-18 MED ORDER — INSULIN LISPRO 100 UNIT/ML INJECTION
100 unit/mL | Freq: Three times a day (TID) | SUBCUTANEOUS | Status: DC
Start: 2017-07-18 — End: 2017-07-26
  Administered 2017-07-18 – 2017-07-26 (×27): via SUBCUTANEOUS

## 2017-07-18 MED ORDER — SERTRALINE 50 MG TAB
50 mg | Freq: Every day | ORAL | Status: DC
Start: 2017-07-18 — End: 2017-07-26
  Administered 2017-07-18 – 2017-07-26 (×9): via ORAL

## 2017-07-18 MED ORDER — SODIUM CHLORIDE 0.9 % IJ SYRG
Freq: Three times a day (TID) | INTRAMUSCULAR | Status: DC
Start: 2017-07-18 — End: 2017-07-26
  Administered 2017-07-18 – 2017-07-26 (×28): via INTRAVENOUS

## 2017-07-18 MED ORDER — ZOLPIDEM 5 MG TAB
5 mg | Freq: Every evening | ORAL | Status: DC | PRN
Start: 2017-07-18 — End: 2017-07-26
  Administered 2017-07-18 – 2017-07-26 (×5): via ORAL

## 2017-07-18 MED ORDER — INSULIN NPH HUMAN RECOMB 100 UNIT/ML INJECTION
100 unit/mL | Freq: Every evening | SUBCUTANEOUS | Status: DC
Start: 2017-07-18 — End: 2017-07-19
  Administered 2017-07-19: 03:00:00 via SUBCUTANEOUS

## 2017-07-18 MED FILL — LIDOCAINE (PF) 10 MG/ML (1 %) IJ SOLN: 10 mg/mL (1 %) | INTRAMUSCULAR | Qty: 30

## 2017-07-18 MED FILL — INSULIN LISPRO 100 UNIT/ML INJECTION: 100 unit/mL | SUBCUTANEOUS | Qty: 1

## 2017-07-18 MED FILL — PIPERACILLIN-TAZOBACTAM 3.375 GRAM IV SOLR: 3.375 gram | INTRAVENOUS | Qty: 3.38

## 2017-07-18 MED FILL — SUCRALFATE 100 MG/ML ORAL SUSP: 100 mg/mL | ORAL | Qty: 10

## 2017-07-18 MED FILL — PANTOPRAZOLE 40 MG TAB, DELAYED RELEASE: 40 mg | ORAL | Qty: 1

## 2017-07-18 MED FILL — LIPITOR 20 MG TABLET: 20 mg | ORAL | Qty: 1

## 2017-07-18 MED FILL — ACETAMINOPHEN 325 MG TABLET: 325 mg | ORAL | Qty: 2

## 2017-07-18 MED FILL — AMLODIPINE 5 MG TAB: 5 mg | ORAL | Qty: 1

## 2017-07-18 MED FILL — MIDAZOLAM 1 MG/ML IJ SOLN: 1 mg/mL | INTRAMUSCULAR | Qty: 5

## 2017-07-18 MED FILL — NORMAL SALINE FLUSH 0.9 % INJECTION SYRINGE: INTRAMUSCULAR | Qty: 20

## 2017-07-18 MED FILL — NORMAL SALINE FLUSH 0.9 % INJECTION SYRINGE: INTRAMUSCULAR | Qty: 10

## 2017-07-18 MED FILL — TRIAMTERENE-HYDROCHLOROTHIAZIDE 37.5 MG-25 MG TAB: ORAL | Qty: 1

## 2017-07-18 MED FILL — ZOLPIDEM 5 MG TAB: 5 mg | ORAL | Qty: 1

## 2017-07-18 MED FILL — HEPARIN (PORCINE) 5,000 UNIT/ML IJ SOLN: 5000 unit/mL | INTRAMUSCULAR | Qty: 1

## 2017-07-18 MED FILL — FENTANYL CITRATE (PF) 50 MCG/ML IJ SOLN: 50 mcg/mL | INTRAMUSCULAR | Qty: 4

## 2017-07-18 MED FILL — SERTRALINE 50 MG TAB: 50 mg | ORAL | Qty: 2

## 2017-07-18 NOTE — Progress Notes (Signed)
1650: [facility] in [facility]. Pt s/p abdominal drainage catheter placement  1735: TRANSFER - OUT REPORT:    Verbal report given to [facility] on Neva Seatebecca D Headrick  being transferred to [facility] for routine progression of care       Report consisted of patient???s Situation, Background, Assessment and   Recommendations(SBAR).     Information from the following report(s) SBAR, Procedure Summary and MAR was reviewed with the receiving nurse.    Lines:   Peripheral IV 07/18/17 Left;Posterior Hand (Active)        Opportunity for questions and clarification was provided.      Patient transported with:   [employer]

## 2017-07-18 NOTE — Progress Notes (Signed)
Bedside shift change report given to Gibson, RN (oncoming nurse) by Anna, RN (offgoing nurse). Report included the following information SBAR, Intake/Output and MAR.

## 2017-07-18 NOTE — Progress Notes (Signed)
Bedside shift change report given to Amber (oncoming nurse) by Gibson (offgoing nurse). Report included the following information SBAR, Kardex and MAR.

## 2017-07-18 NOTE — Progress Notes (Signed)
Medical Progress Note      NAME: Dana [name]   DOB:  [birth year]  MRM:  [id number]    Date/Time: 07/18/2017  8:05 AM    Problem List:   Principal Problem:    Peritoneal abscess (HCC) (07/17/2017)    Active Problems:    Diabetes (HCC) (01/24/2013)      Hypertension complicating diabetes (HCC) (05/25/2016)      Morbid obesity due to excess calories (HCC) (05/25/2016)      Acute gastric ulcer with perforation (HCC) (05/13/2017)      Overview: S/p laporascopic repair and closure 05/12/2017           Subjective:     Patient feeling well. No abd pain    Past Medical History:   Diagnosis Date   ??? Diabetes (HCC) 01/24/2013   ??? GERD (gastroesophageal reflux disease)    ??? Hives    ??? HTN (hypertension)    ??? Hypercholesterolemia    ??? Perforated gastric ulcer (HCC)        ROS:  General: negative for fever, chills, sweats, weakness  Respiratory:  negative for cough, sputum production, SOB, wheezing, DOE, pleuritic pain  Cardiology:  negative for chest pain, palpitations, orthopnea, PND, edema, syncope          Objective:       Vitals:          Last 24hrs VS reviewed since prior progress note. Most recent are:    Visit Vitals  BP 138/86   Pulse 92   Temp 98.1 ??F (36.7 ??C)   Resp 16   Ht 5\' 4"  (1.626 m)   Wt 233 lb 11 oz (106 kg)   SpO2 96%   BMI 40.11 kg/m??     SpO2 Readings from Last 6 Encounters:   07/18/17 96%   07/14/17 96%   06/09/17 98%   05/23/17 98%   05/18/17 93%   05/20/15 94%            Intake/Output Summary (Last 24 hours) at 07/18/2017 0805  Last data filed at 07/18/2017 0504  Gross per 24 hour   Intake 100 ml   Output 1100 ml   Net -1000 ml          Exam:     General   Obese 61 yo wf  Respiratory   Clear To Auscultation bilaterally - no wheezes, rales, rhonchi, or crackles  Cardiology  regular  Abdominal  Soft, good bowel sounds, minimal tenderness around drain site  Extremities  No clubbing, cyanosis, or edema. Pulses intact.    Lab Data Reviewed: (see below)    Medications Reviewed: (see below)     ______________________________________________________________________    Medications:     Current Facility-Administered Medications   Medication Dose Route Frequency   ??? sucralfate (CARAFATE) 100 mg/mL oral suspension 1 g  1 g Oral AC&HS   ??? insulin NPH (NOVOLIN N, HUMULIN N) injection 20 Units  20 Units SubCUTAneous QHS   ??? pantoprazole (PROTONIX) tablet 40 mg  40 mg Oral BID   ??? sertraline (ZOLOFT) tablet 100 mg  100 mg Oral DAILY   ??? traMADol (ULTRAM) tablet 50 mg  50 mg Oral Q8H PRN   ??? triamterene-hydroCHLOROthiazide (MAXZIDE) 37.5-25 mg per tablet 1 Tab  1 Tab Oral DAILY   ??? insulin lispro (HUMALOG) injection   SubCUTAneous TIDAC   ??? glucose chewable tablet 16 g  4 Tab Oral PRN   ??? dextrose (D50W) injection syrg 12.5-25 g  12.5-25  g IntraVENous PRN   ??? glucagon (GLUCAGEN) injection 1 mg  1 mg IntraMUSCular PRN   ??? zolpidem (AMBIEN) tablet 5 mg  5 mg Oral QHS PRN   ??? sodium chloride (NS) flush 5-10 mL  5-10 mL IntraVENous Q8H   ??? sodium chloride (NS) flush 5-10 mL  5-10 mL IntraVENous PRN   ??? acetaminophen (TYLENOL) tablet 650 mg  650 mg Oral Q4H PRN   ??? heparin (porcine) injection 5,000 Units  5,000 Units SubCUTAneous Q8H   ??? piperacillin-tazobactam (ZOSYN) 3.375 g in 0.9% sodium chloride (MBP/ADV) 100 mL  3.375 g IntraVENous Q8H   ??? amLODIPine (NORVASC) tablet 5 mg  5 mg Oral DAILY   ??? atorvastatin (LIPITOR) tablet 20 mg  20 mg Oral QHS            Lab Review:     Recent Labs     07/17/17  1241   WBC 15.1*   HGB 12.2   HCT 38.6   PLT 299     Recent Labs     07/17/17  1241   NA 136   K 4.1   CL 103   CO2 22   GLU 203*   BUN 15   CREA 0.72   CA 8.7   ALB 2.3*   TBILI 1.0   SGOT 28   ALT 17     Lab Results   Component Value Date/Time    Glucose (POC) 160 (H) 07/18/2017 05:52 AM    Glucose (POC) 169 (H) 07/17/2017 08:23 PM    Glucose (POC) 156 (H) 07/14/2017 04:31 PM    Glucose (POC) 206 (H) 07/14/2017 11:37 AM    Glucose (POC) 204 (H) 07/14/2017 06:23 AM      No results for input(s): PH, PCO2, PO2, HCO3, FIO2 in the last 72 hours.  No results for input(s): INR in the last 72 hours.    No lab exists for component: INREXT    Other pertinent lab: NA         Assessment:     Patient Active Problem List   Diagnosis Code   ??? Hypercholesterolemia E78.00   ??? Diabetes (HCC) E11.9   ??? Sleep apnea G47.30   ??? Hypertension complicating diabetes (HCC) E11.59, I10   ??? Morbid obesity due to excess calories (HCC) E66.01   ??? Hives L50.9   ??? Acute gastric ulcer with perforation (HCC) K25.1   ??? Arthralgia of both knees M25.561, M25.562   ??? Abdominal pain R10.9   ??? Peritoneal abscess (HCC) K65.1          Plan:                 1. abd abscess for drainage today  2. Diabetes- ssi  3. Hypertension- restart meds when taking po                ___________________________________________________    Attending Physician: Lake BellsNancy J Kyndra Condron, MD

## 2017-07-18 NOTE — Progress Notes (Signed)
Ms. Dana [name] has no complaints today.  Tm 98.7 HR: 89 BP: 131/90 Resp Rate: 16 95% sat on room air.    Intake/Output Summary (Last 24 hours) at 07/18/2017 0851  Last data filed at 07/18/2017 0504  Gross per 24 hour   Intake 100 ml   Output 1100 ml   Net -1000 ml   Exam: Cor: RRR.              Lungs: Bilateral breath sounds. Clear to auscultation.              Abd: Soft. Non distended.             Tender at drain site.            No guarding or rebound.  Labs:   Recent Results (from the past 12 hour(s))   GLUCOSE, POC    Collection Time: 07/18/17  5:52 AM   Result Value Ref Range    Glucose (POC) 160 (H) 65 - 100 mg/dL    Performed by [facility]    Reviewed CT scan.   For percutaneous drainage of the abdominal wall fluid collection.  NPO for now.   Will resume diet following drainage.  Continue IV abx - Zosyn.  BID protonix as ordered.   DVT prophylaxis - SC Heparin.  Pain medication and anti-emetics as needed.   CBC in AM 07/19/2017.  Plans per Dr. Cherly HensenPahle.

## 2017-07-18 NOTE — Progress Notes (Signed)
Primary Nurse Gibson L Burlee, RN and , RN performed a dual skin assessment on this patient No impairment noted  Braden score is 23

## 2017-07-18 NOTE — Progress Notes (Signed)
Problem: Falls - Risk of  Goal: *Absence of Falls  Document Schmid Fall Risk and appropriate interventions in the flowsheet.  Outcome: Progressing Towards Goal  Fall Risk Interventions:            Medication Interventions: Teach patient to arise slowly

## 2017-07-18 NOTE — H&P (Signed)
Radiology History and Physical    Patient: Dana SeatRebecca D Pizzuto 61 y.o. female       Chief Complaint: Post OP Complication and Drainage from Incision      History of Present Illness: upper abd abscess    History:    Past Medical History:   Diagnosis Date   ??? Diabetes (HCC) 01/24/2013   ??? GERD (gastroesophageal reflux disease)    ??? Hives    ??? HTN (hypertension)    ??? Hypercholesterolemia    ??? Perforated gastric ulcer (HCC)      Family History   Problem Relation Age of Onset   ??? Hypertension Mother    ??? Cancer Father         mesothelioma   ??? Diabetes Father    ??? Hypertension Father      Social History     Socioeconomic History   ??? Marital status: SINGLE     Spouse name: Not on file   ??? Number of children: Not on file   ??? Years of education: Not on file   ??? Highest education level: Not on file   Social Needs   ??? Financial resource strain: Not on file   ??? Food insecurity - worry: Not on file   ??? Food insecurity - inability: Not on file   ??? Transportation needs - medical: Not on file   ??? Transportation needs - non-medical: Not on file   Occupational History   ??? Not on file   Tobacco Use   ??? Smoking status: Former Smoker     Packs/day: 1.00     Last attempt to quit: 06/27/2011     Years since quitting: 6.0   ??? Smokeless tobacco: Never Used   Substance and Sexual Activity   ??? Alcohol use: Yes     Alcohol/week: 0.6 - 1.2 oz     Types: 1 - 2 Glasses of wine per week     Comment: Per month    ??? Drug use: No   ??? Sexual activity: Yes   Other Topics Concern   ??? Not on file   Social History Narrative   ??? Not on file       Allergies:   Allergies   Allergen Reactions   ??? Adhesive Itching   ??? Dolobid [Diflunisal] Itching     Itching,swelling       Current Medications:  Current Facility-Administered Medications   Medication Dose Route Frequency   ??? sodium chloride (NS) flush 5-10 mL  5-10 mL IntraVENous RAD PRN   ??? sucralfate (CARAFATE) 100 mg/mL oral suspension 1 g  1 g Oral AC&HS    ??? insulin NPH (NOVOLIN N, HUMULIN N) injection 20 Units  20 Units SubCUTAneous QHS   ??? pantoprazole (PROTONIX) tablet 40 mg  40 mg Oral BID   ??? sertraline (ZOLOFT) tablet 100 mg  100 mg Oral DAILY   ??? traMADol (ULTRAM) tablet 50 mg  50 mg Oral Q8H PRN   ??? triamterene-hydroCHLOROthiazide (MAXZIDE) 37.5-25 mg per tablet 1 Tab  1 Tab Oral DAILY   ??? insulin lispro (HUMALOG) injection   SubCUTAneous TIDAC   ??? glucose chewable tablet 16 g  4 Tab Oral PRN   ??? dextrose (D50W) injection syrg 12.5-25 g  12.5-25 g IntraVENous PRN   ??? glucagon (GLUCAGEN) injection 1 mg  1 mg IntraMUSCular PRN   ??? zolpidem (AMBIEN) tablet 5 mg  5 mg Oral QHS PRN   ??? sodium chloride (NS) flush 5-10 mL  5-10 mL IntraVENous Q8H   ??? sodium chloride (NS) flush 5-10 mL  5-10 mL IntraVENous PRN   ??? acetaminophen (TYLENOL) tablet 650 mg  650 mg Oral Q4H PRN   ??? heparin (porcine) injection 5,000 Units  5,000 Units SubCUTAneous Q8H   ??? piperacillin-tazobactam (ZOSYN) 3.375 g in 0.9% sodium chloride (MBP/ADV) 100 mL  3.375 g IntraVENous Q8H   ??? amLODIPine (NORVASC) tablet 5 mg  5 mg Oral DAILY   ??? atorvastatin (LIPITOR) tablet 20 mg  20 mg Oral QHS        Physical Exam:  Blood pressure (!) 142/98, pulse 87, temperature 98.3 ??F (36.8 ??C), resp. rate 18, height 5\' 4"  (1.626 m), weight 106 kg (233 lb 11 oz), SpO2 95 %.  LUNG: clear to auscultation bilaterally, HEART: regular rate and rhythm      Alerts:    Hospital Problems  Date Reviewed: 07/13/2017          Codes Class Noted POA    * (Principal) Peritoneal abscess (HCC) ICD-10-CM: K65.1  ICD-9-CM: 567.22  07/17/2017 Yes        Acute gastric ulcer with perforation (HCC) ICD-10-CM: K25.1  ICD-9-CM: 531.10  05/13/2017 Yes    Overview Signed 07/17/2017  5:19 PM by Prudencio Pairitus, C Kent, MD     S/p laporascopic repair and closure 05/12/2017             Hypertension complicating diabetes (HCC) ICD-10-CM: E11.59, I10  ICD-9-CM: 250.80, 401.9  05/25/2016 Yes        Morbid obesity due to excess calories (HCC) ICD-10-CM: E66.01   ICD-9-CM: 278.01  05/25/2016 Yes        Diabetes (HCC) ICD-10-CM: E11.9  ICD-9-CM: 250.00  01/24/2013 Yes              Laboratory:      Recent Labs     07/17/17  1241   HGB 12.2   HCT 38.6   WBC 15.1*   PLT 299   BUN 15   CREA 0.72   K 4.1         Plan of Care/Planned Procedure:  Risks, benefits, and alternatives reviewed with patient and she agrees to proceed with the procedure.       Doristine Counterouglas E Ambri Miltner, MD

## 2017-07-19 ENCOUNTER — Inpatient Hospital Stay: Admit: 2017-07-19 | Payer: BLUE CROSS/BLUE SHIELD | Primary: Internal Medicine

## 2017-07-19 LAB — CULTURE, WOUND W GRAM STAIN: GRAM STAIN: NONE SEEN

## 2017-07-19 LAB — GLUCOSE, POC
Glucose (POC): 204 mg/dL — ABNORMAL HIGH (ref 65–100)
Glucose (POC): 186 mg/dL — ABNORMAL HIGH (ref 65–100)
Glucose (POC): 196 mg/dL — ABNORMAL HIGH (ref 65–100)
Glucose (POC): 175 mg/dL — ABNORMAL HIGH (ref 65–100)

## 2017-07-19 LAB — URIC ACID: Uric acid: 3.5 MG/DL (ref 2.6–6.0)

## 2017-07-19 MED ORDER — INSULIN NPH HUMAN RECOMB 100 UNIT/ML INJECTION
100 unit/mL | Freq: Every evening | SUBCUTANEOUS | Status: DC
Start: 2017-07-19 — End: 2017-07-20
  Administered 2017-07-20 – 2017-07-21 (×2): via SUBCUTANEOUS

## 2017-07-19 MED ORDER — HYDROMORPHONE 2 MG/ML INJECTION SOLUTION
2 mg/mL | INTRAMUSCULAR | Status: DC | PRN
Start: 2017-07-19 — End: 2017-07-21
  Administered 2017-07-19 – 2017-07-21 (×10): via INTRAVENOUS

## 2017-07-19 MED ORDER — HYDROCODONE-ACETAMINOPHEN 5 MG-325 MG TAB
5-325 mg | ORAL | Status: DC | PRN
Start: 2017-07-19 — End: 2017-07-26
  Administered 2017-07-19 – 2017-07-25 (×5): via ORAL

## 2017-07-19 MED ORDER — COLCHICINE 0.6 MG TAB
0.6 mg | Freq: Two times a day (BID) | ORAL | Status: DC
Start: 2017-07-19 — End: 2017-07-19
  Administered 2017-07-19 (×2): via ORAL

## 2017-07-19 MED ORDER — HYDROMORPHONE 2 MG/ML INJECTION SOLUTION
2 mg/mL | Freq: Once | INTRAMUSCULAR | Status: AC
Start: 2017-07-19 — End: 2017-07-19
  Administered 2017-07-19: 18:00:00 via INTRAVENOUS

## 2017-07-19 MED FILL — NORMAL SALINE FLUSH 0.9 % INJECTION SYRINGE: INTRAMUSCULAR | Qty: 10

## 2017-07-19 MED FILL — TRAMADOL 50 MG TAB: 50 mg | ORAL | Qty: 1

## 2017-07-19 MED FILL — HYDROMORPHONE 2 MG/ML INJECTION SOLUTION: 2 mg/mL | INTRAMUSCULAR | Qty: 1

## 2017-07-19 MED FILL — AMLODIPINE 5 MG TAB: 5 mg | ORAL | Qty: 1

## 2017-07-19 MED FILL — PANTOPRAZOLE 40 MG TAB, DELAYED RELEASE: 40 mg | ORAL | Qty: 1

## 2017-07-19 MED FILL — PIPERACILLIN-TAZOBACTAM 3.375 GRAM IV SOLR: 3.375 gram | INTRAVENOUS | Qty: 3.38

## 2017-07-19 MED FILL — INSULIN LISPRO 100 UNIT/ML INJECTION: 100 unit/mL | SUBCUTANEOUS | Qty: 1

## 2017-07-19 MED FILL — HEPARIN (PORCINE) 5,000 UNIT/ML IJ SOLN: 5000 unit/mL | INTRAMUSCULAR | Qty: 1

## 2017-07-19 MED FILL — SUCRALFATE 100 MG/ML ORAL SUSP: 100 mg/mL | ORAL | Qty: 10

## 2017-07-19 MED FILL — ACETAMINOPHEN 325 MG TABLET: 325 mg | ORAL | Qty: 2

## 2017-07-19 MED FILL — SERTRALINE 50 MG TAB: 50 mg | ORAL | Qty: 2

## 2017-07-19 MED FILL — TRIAMTERENE-HYDROCHLOROTHIAZIDE 37.5 MG-25 MG TAB: ORAL | Qty: 1

## 2017-07-19 MED FILL — INSULIN NPH HUMAN RECOMB 100 UNIT/ML INJECTION: 100 unit/mL | SUBCUTANEOUS | Qty: 1

## 2017-07-19 MED FILL — HYDROCODONE-ACETAMINOPHEN 5 MG-325 MG TAB: 5-325 mg | ORAL | Qty: 1

## 2017-07-19 MED FILL — COLCHICINE 0.6 MG TAB: 0.6 mg | ORAL | Qty: 1

## 2017-07-19 MED FILL — INSULIN LISPRO 100 UNIT/ML INJECTION: 100 unit/mL | SUBCUTANEOUS | Qty: 3

## 2017-07-19 MED FILL — NORMAL SALINE FLUSH 0.9 % INJECTION SYRINGE: INTRAMUSCULAR | Qty: 20

## 2017-07-19 MED FILL — LIPITOR 20 MG TABLET: 20 mg | ORAL | Qty: 1

## 2017-07-19 MED FILL — ZOLPIDEM 5 MG TAB: 5 mg | ORAL | Qty: 1

## 2017-07-19 NOTE — Progress Notes (Signed)
Bedside shift change report given to Dana (oncoming nurse) by Gibson (offgoing nurse). Report included the following information SBAR, Kardex and MAR.

## 2017-07-19 NOTE — Progress Notes (Signed)
Reason for Readmission:    Peritoneal Abscess          RRAT Score and Risk Level:     10; Low     Level of Readmission:    2; admissions 9/5-9/12 for hypercholesterolemia and acute gastric ulcer with perforation; 10/27-11/8 for acute gastric ulcer with perforation and ABD pain        Care Conference scheduled:   Not at this time       Resources/supports as identified by patient/family:   Patient lives with her spouse and a roommate, has good support in place.  Patient has been independent in ADLs, to include driving       Top Challenges facing patient (as identified by patient/family and CM):         Finances/Medication cost?     Has [employer] PPO; uses [facility] at [employer]; spouse to provide transport at discharge  Support system or lack thereof?    Good family and friend support in place   Living arrangements?  With spouse and roommate in 2 story home, 2nd floor bedroom, 5 exterior steps, 15 interior steps         Self-care/ADLs/Cognition?   Independent, and alert and oriented x 4         Current Advanced Directive/Advance Care Plan:  FULL CODE; not on file           Plan for utilizing home health:   Selected [facility]             Likelihood of additional readmission:   Moderate due to ongoing issues with abscess and ulcers             Transition of Care Plan:    Based on readmission, the patient's previous Plan of Care   has been evaluated and/or modified. The current Transition of Care Plan is:       Patient is anticipated to discharge with drain in place.  Both patient and spouse expressed concerns of caring for the drain within home environment and have requested [facility] support for care of the drain.  CM discussed that nursing will provide education regarding drain care, cleaning,and emptying.  Patient and spouse remain hesitant about ability to fully care for the drain without additional support.  Patient indicated that she would like to work with [facility] if [facility] is indicated.  Patient has no  hx of HH, IPR, or SNF.      Care Management Interventions  PCP Verified by CM: Yes(Patient's PCP is attending physician; Dr. Fanny Skates rounded on patient today, was supposed to have appointment this week, and was last seen in office in September)  Palliative Care Criteria Met (RRAT>21 & CHF Dx)?: No  Mode of Transport at Discharge: Other (see comment)(Spouse Leilani Able) at bedside to transport patient upon discharge)  Transition of Care Consult (CM Consult): Discharge Planning, [place]: Yes  MyChart Signup: Yes  Discharge Durable Medical Equipment: No(Has DME of: glucometer and BP Cuff)  Health Maintenance Reviewed: Yes(CM met with patient, with spouse at bedside, with patient alert and oriented x 4)  Physical Therapy Consult: No  Occupational Therapy Consult: No  Speech Therapy Consult: No  Current Support Network: Lives with Spouse, Own Home(Lives with spouse and a roommate in a 2 story home)  Confirm Follow Up Transport: Self(Independent in ADLs, to include drivingk)  Plan discussed with Pt/Family/Caregiver: Yes(Spouse at bedside)  Freedom of Choice  Offered: Yes(Patient and spouse are requesting HH support from [facility])  [employer] Provided?: No  Discharge Location  Discharge Placement: (Lives with spouse and a roommate in a 2 story home, 2nd floor bedroom, 5 exterior steps, 15 interior steps)    CRM: Irving Shows, MPH, [place]; Z: [phone]

## 2017-07-19 NOTE — Progress Notes (Signed)
Bedside shift change report given to Gibson (oncoming nurse) by Amber (offgoing nurse). Report included the following information SBAR, Kardex, Intake/Output and MAR.

## 2017-07-19 NOTE — Progress Notes (Signed)
Medical Progress Note      NAME: Dana [name]   DOB:  12/10/1955  MRM:  [id number]    Date/Time: 07/19/2017  8:34 AM    Problem List:   Principal Problem:    Peritoneal abscess (HCC) (07/17/2017)    Active Problems:    Diabetes (HCC) (01/24/2013)      Hypertension complicating diabetes (HCC) (05/25/2016)      Morbid obesity due to excess calories (HCC) (05/25/2016)      Acute gastric ulcer with perforation (HCC) (05/13/2017)      Overview: S/p laporascopic repair and closure 05/12/2017           Subjective:     Patient c/o pain in left foot. Unable to put weight on it.  Abdomen is fine    Past Medical History:   Diagnosis Date   ??? Diabetes (HCC) 01/24/2013   ??? GERD (gastroesophageal reflux disease)    ??? Hives    ??? HTN (hypertension)    ??? Hypercholesterolemia    ??? Perforated gastric ulcer (HCC)        ROS:  General: negative for fever, chills, sweats, weakness  Respiratory:  negative for cough, sputum production, SOB, wheezing, DOE, pleuritic pain  Cardiology:  negative for chest pain, palpitations, orthopnea, PND, edema, syncope   Gastrointestinal: negative for abdominal pain, N/V, dysphagia, change in bowel habits, bleeding  Muskuloskeletal : positive for left foot pain         Objective:       Vitals:          Last 24hrs VS reviewed since prior progress note. Most recent are:    Visit Vitals  BP 133/86   Pulse 89   Temp 98.1 ??F (36.7 ??C)   Resp 16   Ht 5\' 4"  (1.626 m)   Wt 233 lb 11 oz (106 kg)   SpO2 96%   BMI 40.11 kg/m??     SpO2 Readings from Last 6 Encounters:   07/19/17 96%   07/14/17 96%   06/09/17 98%   05/23/17 98%   05/18/17 93%   05/20/15 94%    O2 Flow Rate (L/min): 2 l/min       Intake/Output Summary (Last 24 hours) at 07/19/2017 0834  Last data filed at 07/19/2017 0449  Gross per 24 hour   Intake 520 ml   Output 510 ml   Net 10 ml          Exam:     General   Obese wf- c/o pain in left foot  Respiratory   Clear To Auscultation bilaterally - no wheezes, rales, rhonchi, or crackles  Cardiology  regular   Abdominal  Soft, non-tender, non-distended, positive bowel sounds, no hepatosplenomegaly  Extremities  Left foot tender over 5th mt, slightly erythematous area    Lab Data Reviewed: (see below)    Medications Reviewed: (see below)    ______________________________________________________________________    Medications:     Current Facility-Administered Medications   Medication Dose Route Frequency   ??? colchicine tablet 0.6 mg  0.6 mg Oral BID   ??? HYDROcodone-acetaminophen (NORCO) 5-325 mg per tablet 1 Tab  1 Tab Oral Q4H PRN   ??? sodium chloride (NS) flush 5-10 mL  5-10 mL IntraVENous RAD PRN   ??? sucralfate (CARAFATE) 100 mg/mL oral suspension 1 g  1 g Oral AC&HS   ??? insulin NPH (NOVOLIN N, HUMULIN N) injection 20 Units  20 Units SubCUTAneous QHS   ??? pantoprazole (PROTONIX) tablet 40 mg  40 mg Oral BID   ??? sertraline (ZOLOFT) tablet 100 mg  100 mg Oral DAILY   ??? traMADol (ULTRAM) tablet 50 mg  50 mg Oral Q8H PRN   ??? triamterene-hydroCHLOROthiazide (MAXZIDE) 37.5-25 mg per tablet 1 Tab  1 Tab Oral DAILY   ??? insulin lispro (HUMALOG) injection   SubCUTAneous TIDAC   ??? glucose chewable tablet 16 g  4 Tab Oral PRN   ??? dextrose (D50W) injection syrg 12.5-25 g  12.5-25 g IntraVENous PRN   ??? glucagon (GLUCAGEN) injection 1 mg  1 mg IntraMUSCular PRN   ??? zolpidem (AMBIEN) tablet 5 mg  5 mg Oral QHS PRN   ??? sodium chloride (NS) flush 5-10 mL  5-10 mL IntraVENous Q8H   ??? sodium chloride (NS) flush 5-10 mL  5-10 mL IntraVENous PRN   ??? acetaminophen (TYLENOL) tablet 650 mg  650 mg Oral Q4H PRN   ??? heparin (porcine) injection 5,000 Units  5,000 Units SubCUTAneous Q8H   ??? piperacillin-tazobactam (ZOSYN) 3.375 g in 0.9% sodium chloride (MBP/ADV) 100 mL  3.375 g IntraVENous Q8H   ??? amLODIPine (NORVASC) tablet 5 mg  5 mg Oral DAILY   ??? atorvastatin (LIPITOR) tablet 20 mg  20 mg Oral QHS            Lab Review:     Recent Labs     07/17/17  1241   WBC 15.1*   HGB 12.2   HCT 38.6   PLT 299     Recent Labs     07/17/17  1241   NA 136    K 4.1   CL 103   CO2 22   GLU 203*   BUN 15   CREA 0.72   CA 8.7   ALB 2.3*   TBILI 1.0   SGOT 28   ALT 17     Lab Results   Component Value Date/Time    Glucose (POC) 186 (H) 07/19/2017 06:40 AM    Glucose (POC) 204 (H) 07/18/2017 10:10 PM    Glucose (POC) 117 (H) 07/18/2017 06:24 PM    Glucose (POC) 149 (H) 07/18/2017 12:23 PM    Glucose (POC) 160 (H) 07/18/2017 05:52 AM     No results for input(s): PH, PCO2, PO2, HCO3, FIO2 in the last 72 hours.  No results for input(s): INR in the last 72 hours.    No lab exists for component: INREXT    Other pertinent lab: NA         Assessment:     Patient Active Problem List   Diagnosis Code   ??? Hypercholesterolemia E78.00   ??? Diabetes (HCC) E11.9   ??? Sleep apnea G47.30   ??? Hypertension complicating diabetes (HCC) E11.59, I10   ??? Morbid obesity due to excess calories (HCC) E66.01   ??? Hives L50.9   ??? Acute gastric ulcer with perforation (HCC) K25.1   ??? Arthralgia of both knees M25.561, M25.562   ??? Abdominal pain R10.9   ??? Peritoneal abscess (HCC) K65.1          Plan:                 1. Gastric perf post op repair .  S/p drainage ruq abscess.  On zosyn  2. Diabetes=- lantus and ssi  3. Gout left foot- add colchicine and norco- unable to use steroids or nsaids given perf x2                ___________________________________________________  Attending Physician: Genene Churn, MD

## 2017-07-19 NOTE — Progress Notes (Signed)
Progress Note    Patient: Dana [name] MRN: [id number]  SSN: [id number]    Date of Birth: [birth year]  Age: 61 y.o.  Sex: female      Admit Date: 07/17/2017    * No surgery found *    Procedure:      Subjective:     Patient feeling ok today. She has been having some foot pain.     Objective:     Visit Vitals  BP 140/87 (BP 1 Location: Right arm, BP Patient Position: At rest)   Pulse 91   Temp 98.1 ??F (36.7 ??C)   Resp 18   Ht 5\' 4"  (1.626 m)   Wt 233 lb 11 oz (106 kg)   SpO2 95%   BMI 40.11 kg/m??       Temp (24hrs), Avg:98.2 ??F (36.8 ??C), Min:98 ??F (36.7 ??C), Max:98.4 ??F (36.9 ??C)      Physical Exam:    gen- Alert in NAD  Lungs- CTA  H-RRR  Abd- S/nt/ND  Drain- purulen     Data Review: images and reports reviewed    Lab Review:   All lab results for the last 24 hours reviewed.  Recent Results (from the past 24 hour(s))   CULTURE, ANAEROBIC    Collection Time: 07/18/17  4:40 PM   Result Value Ref Range    Special Requests: ABDOMEN      Culture result: PENDING    CULTURE, WOUND W GRAM STAIN    Collection Time: 07/18/17  4:40 PM   Result Value Ref Range    Special Requests: ABDOMINAL ABSCESS     GRAM STAIN 2+ WBCS SEEN      GRAM STAIN 3+ GRAM NEGATIVE RODS      GRAM STAIN 3+ GRAM POSITIVE COCCI IN PAIRS      GRAM STAIN 1+ GRAM POSITIVE RODS      Culture result: MODERATE GRAM NEGATIVE RODS (A)      Culture result: (A)       HEAVY STREPTOCOCCI, BETA HEMOLYTIC GROUP C . Penicillin and ampicillin are drugs of choice for treatment of beta-hemolytic streptococcal infections. Susceptibility testing of penicillins and beta-lactams approved by the FDA for treatment of beta-hemolytic streptococcal infections need not be performed routinely, because nonsusceptible isolates are extremely rare. CLSI 2012   GLUCOSE, POC    Collection Time: 07/18/17  6:24 PM   Result Value Ref Range    Glucose (POC) 117 (H) 65 - 100 mg/dL    Performed by Marlinda MikeBURLEE HILL GIBSON    GLUCOSE, POC    Collection Time: 07/18/17 10:10 PM   Result Value Ref Range     Glucose (POC) 204 (H) 65 - 100 mg/dL    Performed by Mikey CollegeMalloy Amber  traveler    GLUCOSE, POC    Collection Time: 07/19/17  6:40 AM   Result Value Ref Range    Glucose (POC) 186 (H) 65 - 100 mg/dL    Performed by Mikey CollegeMalloy Amber  traveler    URIC ACID    Collection Time: 07/19/17  9:36 AM   Result Value Ref Range    Uric acid 3.5 2.6 - 6.0 MG/DL   GLUCOSE, POC    Collection Time: 07/19/17 12:06 PM   Result Value Ref Range    Glucose (POC) 196 (H) 65 - 100 mg/dL    Performed by CRUMMETT CAITLYN(CON)        Assessment:     Hospital Problems  Date Reviewed: 07/13/2017  Codes Class Noted POA    * (Principal) Peritoneal abscess (HCC) ICD-10-CM: K65.1  ICD-9-CM: 567.22  07/17/2017 Yes        Acute gastric ulcer with perforation (HCC) ICD-10-CM: K25.1  ICD-9-CM: 531.10  05/13/2017 Yes    Overview Signed 07/17/2017  5:19 PM by Prudencio Pairitus, C Kent, MD     S/p laporascopic repair and closure 05/12/2017             Hypertension complicating diabetes (HCC) ICD-10-CM: E11.59, I10  ICD-9-CM: 250.80, 401.9  05/25/2016 Yes        Morbid obesity due to excess calories (HCC) ICD-10-CM: E66.01  ICD-9-CM: 278.01  05/25/2016 Yes        Diabetes (HCC) ICD-10-CM: E11.9  ICD-9-CM: 250.00  01/24/2013 Yes              Plan/Recommendations/Medical Decision Making:   Continue ABX  Will recheck labs tomorrow.   Continue diet  Will end up going home with drain.     Signed By: Hoy FinlayStuart Jullia Mulligan, MD     July 19, 2017

## 2017-07-19 NOTE — Progress Notes (Signed)
Spoke to dr. Cherly HensenPahle about patient left foot pain, she said to increase colchacine to 3 times a day and put in prn dilaudid 1 mg every 3 hours as needed.

## 2017-07-19 NOTE — Progress Notes (Addendum)
Patient has been complaining of pain in the left foot.  8/10 on the pain scale.  Uric acid was drawn and was within range.  Physician has been notified of consistent pain, X-ray has been ordered to rule out any injuries.  Patient has been medicated with Tramadol and Dilaudid.    Abdominal dressing is C/D/I.    Abdominal dressing was leaking drainage.  Removed old dressing and replaced with 4x4, two ABD pads, and silk tape.  Patient tolerated well.

## 2017-07-20 ENCOUNTER — Inpatient Hospital Stay: Admit: 2017-07-20 | Payer: BLUE CROSS/BLUE SHIELD | Primary: Internal Medicine

## 2017-07-20 LAB — METABOLIC PANEL, BASIC
Anion gap: 12 mmol/L (ref 5–15)
BUN/Creatinine ratio: 19 (ref 12–20)
BUN: 11 MG/DL (ref 6–20)
CO2: 25 mmol/L (ref 21–32)
Calcium: 8.5 MG/DL (ref 8.5–10.1)
Chloride: 96 mmol/L — ABNORMAL LOW (ref 97–108)
Creatinine: 0.58 MG/DL (ref 0.55–1.02)
GFR est AA: >60 mL/min/{1.73_m2} (ref >60)
GFR est non-AA: >60 mL/min/{1.73_m2} (ref >60)
Glucose: 217 mg/dL — ABNORMAL HIGH (ref 65–100)
Potassium: 2.9 mmol/L — ABNORMAL LOW (ref 3.5–5.1)
Sodium: 133 mmol/L — ABNORMAL LOW (ref 136–145)

## 2017-07-20 LAB — GLUCOSE, POC
Glucose (POC): 209 mg/dL — ABNORMAL HIGH (ref 65–100)
Glucose (POC): 216 mg/dL — ABNORMAL HIGH (ref 65–100)
Glucose (POC): 212 mg/dL — ABNORMAL HIGH (ref 65–100)
Glucose (POC): 189 mg/dL — ABNORMAL HIGH (ref 65–100)

## 2017-07-20 LAB — CBC WITH AUTOMATED DIFF
ABS. BASOPHILS: 0.1 10*3/uL (ref 0.0–0.1)
ABS. EOSINOPHILS: 0.1 10*3/uL (ref 0.0–0.4)
ABS. IMM. GRANS.: 0.1 10*3/uL — ABNORMAL HIGH (ref 0.00–0.04)
ABS. LYMPHOCYTES: 1.5 10*3/uL (ref 0.8–3.5)
ABS. MONOCYTES: 0.9 10*3/uL (ref 0.0–1.0)
ABS. NEUTROPHILS: 5.6 10*3/uL (ref 1.8–8.0)
ABSOLUTE NRBC: 0 10*3/uL (ref 0.00–0.01)
BASOPHILS: 1 % (ref 0–1)
EOSINOPHILS: 1 % (ref 0–7)
HCT: 34.9 % — ABNORMAL LOW (ref 35.0–47.0)
HGB: 11.2 g/dL — ABNORMAL LOW (ref 11.5–16.0)
IMMATURE GRANULOCYTES: 1 % — ABNORMAL HIGH (ref 0.0–0.5)
LYMPHOCYTES: 19 % (ref 12–49)
MCH: 28.9 PG (ref 26.0–34.0)
MCHC: 32.1 g/dL (ref 30.0–36.5)
MCV: 89.9 FL (ref 80.0–99.0)
MONOCYTES: 11 % (ref 5–13)
MPV: 9.5 FL (ref 8.9–12.9)
NEUTROPHILS: 68 % (ref 32–75)
NRBC: 0 PER 100 WBC
PLATELET: 187 10*3/uL (ref 150–400)
RBC: 3.88 M/uL (ref 3.80–5.20)
RDW: 15.8 % — ABNORMAL HIGH (ref 11.5–14.5)
WBC: 8.2 10*3/uL (ref 3.6–11.0)

## 2017-07-20 LAB — CULTURE, WOUND W GRAM STAIN

## 2017-07-20 MED ORDER — COLCHICINE 0.6 MG TAB
0.6 mg | Freq: Three times a day (TID) | ORAL | Status: DC
Start: 2017-07-20 — End: 2017-07-26
  Administered 2017-07-20 – 2017-07-26 (×20): via ORAL

## 2017-07-20 MED ORDER — CEFTRIAXONE 2 GRAM SOLUTION FOR INJECTION
2 gram | INTRAMUSCULAR | Status: DC
Start: 2017-07-20 — End: 2017-07-26
  Administered 2017-07-20 – 2017-07-25 (×5): via INTRAVENOUS

## 2017-07-20 MED FILL — SUCRALFATE 100 MG/ML ORAL SUSP: 100 mg/mL | ORAL | Qty: 10

## 2017-07-20 MED FILL — HEPARIN (PORCINE) 5,000 UNIT/ML IJ SOLN: 5000 unit/mL | INTRAMUSCULAR | Qty: 1

## 2017-07-20 MED FILL — PANTOPRAZOLE 40 MG TAB, DELAYED RELEASE: 40 mg | ORAL | Qty: 1

## 2017-07-20 MED FILL — INSULIN LISPRO 100 UNIT/ML INJECTION: 100 unit/mL | SUBCUTANEOUS | Qty: 1

## 2017-07-20 MED FILL — TRIAMTERENE-HYDROCHLOROTHIAZIDE 37.5 MG-25 MG TAB: ORAL | Qty: 1

## 2017-07-20 MED FILL — CEFTRIAXONE 2 GRAM SOLUTION FOR INJECTION: 2 gram | INTRAMUSCULAR | Qty: 2

## 2017-07-20 MED FILL — NORMAL SALINE FLUSH 0.9 % INJECTION SYRINGE: INTRAMUSCULAR | Qty: 10

## 2017-07-20 MED FILL — PIPERACILLIN-TAZOBACTAM 3.375 GRAM IV SOLR: 3.375 gram | INTRAVENOUS | Qty: 3.38

## 2017-07-20 MED FILL — COLCHICINE 0.6 MG TAB: 0.6 mg | ORAL | Qty: 1

## 2017-07-20 MED FILL — AMLODIPINE 5 MG TAB: 5 mg | ORAL | Qty: 1

## 2017-07-20 MED FILL — SERTRALINE 50 MG TAB: 50 mg | ORAL | Qty: 2

## 2017-07-20 MED FILL — HYDROMORPHONE 2 MG/ML INJECTION SOLUTION: 2 mg/mL | INTRAMUSCULAR | Qty: 1

## 2017-07-20 MED FILL — INSULIN NPH HUMAN RECOMB 100 UNIT/ML INJECTION: 100 unit/mL | SUBCUTANEOUS | Qty: 1

## 2017-07-20 MED FILL — LIPITOR 20 MG TABLET: 20 mg | ORAL | Qty: 1

## 2017-07-20 NOTE — Progress Notes (Signed)
Progress Note    Patient: Dana [name] MRN: [id number]  SSN: [id number]    Date of Birth: [birth year]  Age: 61 y.o.  Sex: female      Admit Date: 07/17/2017    * No surgery found *    Procedure:      Subjective:     Patient mainly complaining of foot pain. She is unsure if she pulled her drain out of position.     Objective:     Visit Vitals  BP 135/83   Pulse 86   Temp 97.8 ??F (36.6 ??C)   Resp 18   Ht 5\' 4"  (1.626 m)   Wt 233 lb 11 oz (106 kg)   SpO2 94%   BMI 40.11 kg/m??       Temp (24hrs), Avg:98.1 ??F (36.7 ??C), Min:97.8 ??F (36.6 ??C), Max:98.4 ??F (36.9 ??C)      Physical Exam:    Gen- Alert in NAD  Abd- S/nt/nd   Still some purulent drainage in the bag.   Left foot markedly swollen.     Data Review: images and reports reviewed    Lab Review:   All lab results for the last 24 hours reviewed.  Recent Results (from the past 24 hour(s))   GLUCOSE, POC    Collection Time: 07/19/17  4:31 PM   Result Value Ref Range    Glucose (POC) 175 (H) 65 - 100 mg/dL    Performed by Marlinda MikeBURLEE HILL GIBSON    GLUCOSE, POC    Collection Time: 07/19/17 10:49 PM   Result Value Ref Range    Glucose (POC) 209 (H) 65 - 100 mg/dL    Performed by Donzetta KohutKenny Dana    CBC WITH AUTOMATED DIFF    Collection Time: 07/20/17  2:16 AM   Result Value Ref Range    WBC 8.2 3.6 - 11.0 K/uL    RBC 3.88 3.80 - 5.20 M/uL    HGB 11.2 (L) 11.5 - 16.0 g/dL    HCT 78.234.9 (L) 95.635.0 - 47.0 %    MCV 89.9 80.0 - 99.0 FL    MCH 28.9 26.0 - 34.0 PG    MCHC 32.1 30.0 - 36.5 g/dL    RDW 21.315.8 (H) 08.611.5 - 14.5 %    PLATELET 187 150 - 400 K/uL    MPV 9.5 8.9 - 12.9 FL    NRBC 0.0 0 PER 100 WBC    ABSOLUTE NRBC 0.00 0.00 - 0.01 K/uL    NEUTROPHILS 68 32 - 75 %    LYMPHOCYTES 19 12 - 49 %    MONOCYTES 11 5 - 13 %    EOSINOPHILS 1 0 - 7 %    BASOPHILS 1 0 - 1 %    IMMATURE GRANULOCYTES 1 (H) 0.0 - 0.5 %    ABS. NEUTROPHILS 5.6 1.8 - 8.0 K/UL    ABS. LYMPHOCYTES 1.5 0.8 - 3.5 K/UL    ABS. MONOCYTES 0.9 0.0 - 1.0 K/UL    ABS. EOSINOPHILS 0.1 0.0 - 0.4 K/UL     ABS. BASOPHILS 0.1 0.0 - 0.1 K/UL    ABS. IMM. GRANS. 0.1 (H) 0.00 - 0.04 K/UL    DF AUTOMATED     METABOLIC PANEL, BASIC    Collection Time: 07/20/17  2:16 AM   Result Value Ref Range    Sodium 133 (L) 136 - 145 mmol/L    Potassium 2.9 (L) 3.5 - 5.1 mmol/L    Chloride 96 (L) 97 - 108 mmol/L  CO2 25 21 - 32 mmol/L    Anion gap 12 5 - 15 mmol/L    Glucose 217 (H) 65 - 100 mg/dL    BUN 11 6 - 20 MG/DL    Creatinine 1.610.58 0.960.55 - 1.02 MG/DL    BUN/Creatinine ratio 19 12 - 20      GFR est AA >60 >60 ml/min/1.8273m2    GFR est non-AA >60 >60 ml/min/1.3473m2    Calcium 8.5 8.5 - 10.1 MG/DL   GLUCOSE, POC    Collection Time: 07/20/17  6:48 AM   Result Value Ref Range    Glucose (POC) 216 (H) 65 - 100 mg/dL    Performed by Tobias AlexanderMiller Katarina    GLUCOSE, POC    Collection Time: 07/20/17 11:11 AM   Result Value Ref Range    Glucose (POC) 212 (H) 65 - 100 mg/dL    Performed by Helyn AppPOWELL  PATRICIA        Assessment:     Hospital Problems  Date Reviewed: 07/13/2017          Codes Class Noted POA    * (Principal) Peritoneal abscess (HCC) ICD-10-CM: K65.1  ICD-9-CM: 567.22  07/17/2017 Yes        Acute gastric ulcer with perforation (HCC) ICD-10-CM: K25.1  ICD-9-CM: 531.10  05/13/2017 Yes    Overview Signed 07/17/2017  5:19 PM by Prudencio Pairitus, C Kent, MD     S/p laporascopic repair and closure 05/12/2017             Hypertension complicating diabetes (HCC) ICD-10-CM: E11.59, I10  ICD-9-CM: 250.80, 401.9  05/25/2016 Yes        Morbid obesity due to excess calories (HCC) ICD-10-CM: E66.01  ICD-9-CM: 278.01  05/25/2016 Yes        Diabetes (HCC) ICD-10-CM: E11.9  ICD-9-CM: 250.00  01/24/2013 Yes              Plan/Recommendations/Medical Decision Making:   Will order CT scan to ensure that the drain is in the appropriate position.   Continue Abx.   colchicine for gout.       Signed By: Hoy FinlayStuart Bob Daversa, MD     July 20, 2017

## 2017-07-20 NOTE — Other (Signed)
DTC Progress Note    Recommendations/ Comments: Chart review for hyperglycemia. FBG 216 mg/dL today in spite of increasing NPH to 24 units last night.  Yesterday BG's > 180 mg/dL, received 8 units of lispro correction    If appropriate, please consider   Increase NPH to 32 units each PM (her home dose)  Document po intake    Current hospital DM medication:   Lispro resistant correction scale    Chart reviewed on Neva Seatebecca D Lindenbaum.    Patient is a 61 y.o. female with known DM on NPH 32 units at night and lispro sliding scale AC & HS at home.    A1c:   Lab Results   Component Value Date/Time    Hemoglobin A1c 7.3 (H) 05/13/2017 03:04 AM    Hemoglobin A1c 7.2 (H) 01/31/2015 03:52 PM       Recent Glucose Results:   Lab Results   Component Value Date/Time    GLU 217 (H) 07/20/2017 02:16 AM    GLUCPOC 212 (H) 07/20/2017 11:11 AM    GLUCPOC 216 (H) 07/20/2017 06:48 AM    GLUCPOC 209 (H) 07/19/2017 10:49 PM        Lab Results   Component Value Date/Time    Creatinine 0.58 07/20/2017 02:16 AM     Estimated Creatinine Clearance: 124 mL/min (based on SCr of 0.58 mg/dL).    Active Orders   Diet    DIET DIABETIC CONSISTENT CARB Regular        PO intake:   Patient Vitals for the past 72 hrs:   % Diet Eaten   07/18/17 0830 0 %       Will continue to follow as needed.    Thank you  Higinio PlanSallie P. Armour Villanueva RN, CDE  Pager [phone]

## 2017-07-20 NOTE — Progress Notes (Signed)
Bedside shift change report given to Jenny, RN (oncoming nurse) by Dana, RN (offgoing nurse). Report included the following information SBAR, Kardex, Intake/Output and MAR.

## 2017-07-20 NOTE — Progress Notes (Signed)
Medical Progress Note      NAME: Dana [name]   DOB:  1955/11/23  MRM:  [id number]    Date/Time: 07/20/2017  11:40 PM    Problem List:   Principal Problem:    Peritoneal abscess (HCC) (07/17/2017)    Active Problems:    Diabetes (HCC) (01/24/2013)      Hypertension complicating diabetes (HCC) (05/25/2016)      Morbid obesity due to excess calories (HCC) (05/25/2016)      Acute gastric ulcer with perforation (HCC) (05/13/2017)      Overview: S/p laporascopic repair and closure 05/12/2017           Subjective:     Patient c/o of severe left foot pain.  Can not bear weight on foot.      Past Medical History:   Diagnosis Date   ??? Diabetes (HCC) 01/24/2013   ??? GERD (gastroesophageal reflux disease)    ??? Hives    ??? HTN (hypertension)    ??? Hypercholesterolemia    ??? Perforated gastric ulcer (HCC)        ROS:  General: negative for fever, chills, sweats, weakness  Respiratory:  negative for cough, sputum production, SOB, wheezing, DOE, pleuritic pain  Cardiology:  negative for chest pain, palpitations, orthopnea, PND, edema, syncope   Gastrointestinal: negative for abdominal pain, N/V, dysphagia, change in bowel habits, bleeding         Objective:       Vitals:          Last 24hrs VS reviewed since prior progress note. Most recent are:    Visit Vitals  BP 126/81 (BP 1 Location: Right arm, BP Patient Position: At rest;Head of bed elevated (Comment degrees))   Pulse 99   Temp 99.6 ??F (37.6 ??C)   Resp 18   Ht 5\' 4"  (1.626 m)   Wt 233 lb 11 oz (106 kg)   SpO2 93%   BMI 40.11 kg/m??     SpO2 Readings from Last 6 Encounters:   07/20/17 93%   07/14/17 96%   06/09/17 98%   05/23/17 98%   05/18/17 93%   05/20/15 94%    O2 Flow Rate (L/min): 2 l/min       Intake/Output Summary (Last 24 hours) at 07/20/2017 2340  Last data filed at 07/20/2017 1745  Gross per 24 hour   Intake 20 ml   Output 1860 ml   Net -1840 ml          Exam:     General    61 yo obese f  Respiratory   Clear To Auscultation bilaterally - no wheezes, rales,  rhonchi, or crackles  Cardiology  regular  Abdominal  Soft, non-tender, non-distended, positive bowel sounds, no hepatosplenomegaly  Extremities  Erythema around distal foot, exquisitely tender on light touch,    Lab Data Reviewed: (see below)    Medications Reviewed: (see below)    ______________________________________________________________________    Medications:     Current Facility-Administered Medications   Medication Dose Route Frequency   ??? cefTRIAXone (ROCEPHIN) 2 g in 0.9% sodium chloride (MBP/ADV) 50 mL  2 g IntraVENous Q24H   ??? HYDROcodone-acetaminophen (NORCO) 5-325 mg per tablet 1 Tab  1 Tab Oral Q4H PRN   ??? insulin NPH (NOVOLIN N, HUMULIN N) injection 24 Units  24 Units SubCUTAneous QHS   ??? HYDROmorphone (DILAUDID) injection 1 mg  1 mg IntraVENous Q3H PRN   ??? colchicine tablet 0.6 mg  0.6 mg Oral TID   ???  sodium chloride (NS) flush 5-10 mL  5-10 mL IntraVENous RAD PRN   ??? sucralfate (CARAFATE) 100 mg/mL oral suspension 1 g  1 g Oral AC&HS   ??? pantoprazole (PROTONIX) tablet 40 mg  40 mg Oral BID   ??? sertraline (ZOLOFT) tablet 100 mg  100 mg Oral DAILY   ??? traMADol (ULTRAM) tablet 50 mg  50 mg Oral Q8H PRN   ??? triamterene-hydroCHLOROthiazide (MAXZIDE) 37.5-25 mg per tablet 1 Tab  1 Tab Oral DAILY   ??? insulin lispro (HUMALOG) injection   SubCUTAneous TIDAC   ??? glucose chewable tablet 16 g  4 Tab Oral PRN   ??? dextrose (D50W) injection syrg 12.5-25 g  12.5-25 g IntraVENous PRN   ??? glucagon (GLUCAGEN) injection 1 mg  1 mg IntraMUSCular PRN   ??? zolpidem (AMBIEN) tablet 5 mg  5 mg Oral QHS PRN   ??? sodium chloride (NS) flush 5-10 mL  5-10 mL IntraVENous Q8H   ??? sodium chloride (NS) flush 5-10 mL  5-10 mL IntraVENous PRN   ??? acetaminophen (TYLENOL) tablet 650 mg  650 mg Oral Q4H PRN   ??? heparin (porcine) injection 5,000 Units  5,000 Units SubCUTAneous Q8H   ??? amLODIPine (NORVASC) tablet 5 mg  5 mg Oral DAILY   ??? atorvastatin (LIPITOR) tablet 20 mg  20 mg Oral QHS            Lab Review:     Recent Labs      07/20/17  0216   WBC 8.2   HGB 11.2*   HCT 34.9*   PLT 187     Recent Labs     07/20/17  0216   NA 133*   K 2.9*   CL 96*   CO2 25   GLU 217*   BUN 11   CREA 0.58   CA 8.5     Lab Results   Component Value Date/Time    Glucose (POC) 174 (H) 07/20/2017 08:47 PM    Glucose (POC) 189 (H) 07/20/2017 05:16 PM    Glucose (POC) 212 (H) 07/20/2017 11:11 AM    Glucose (POC) 216 (H) 07/20/2017 06:48 AM    Glucose (POC) 209 (H) 07/19/2017 10:49 PM     No results for input(s): PH, PCO2, PO2, HCO3, FIO2 in the last 72 hours.  No results for input(s): INR in the last 72 hours.    No lab exists for component: INREXT    Other pertinent lab: NA         Assessment:     Patient Active Problem List   Diagnosis Code   ??? Hypercholesterolemia E78.00   ??? Diabetes (HCC) E11.9   ??? Sleep apnea G47.30   ??? Hypertension complicating diabetes (HCC) E11.59, I10   ??? Morbid obesity due to excess calories (HCC) E66.01   ??? Hives L50.9   ??? Acute gastric ulcer with perforation (HCC) K25.1   ??? Arthralgia of both knees M25.561, M25.562   ??? Abdominal pain R10.9   ??? Peritoneal abscess (HCC) K65.1          Plan:                 1. Gastric perf with abscess- s/p drainage. On zosyn  2. Diabetes- increase to 28 units  3. Gout- continue colchicine                ___________________________________________________    Attending Physician: Lake BellsNancy J Yovanna Cogan, MD

## 2017-07-20 NOTE — Progress Notes (Signed)
07/20/17; 10:30 -   CM participated in IDRs on patient and did independent chart review.  Patient has had complaints of left foot pain, and she may be exhibiting a new flair of gout.  Plan is to continue ABX, diet advancements, and to recheck labs today, 11/14.  CRM: Zollie BeckersEiren Childress, MPH, CHES; Z: [phone]

## 2017-07-21 ENCOUNTER — Encounter: Attending: Surgery | Primary: Internal Medicine

## 2017-07-21 LAB — GLUCOSE, POC
Glucose (POC): 174 mg/dL — ABNORMAL HIGH (ref 65–100)
Glucose (POC): 190 mg/dL — ABNORMAL HIGH (ref 65–100)
Glucose (POC): 213 mg/dL — ABNORMAL HIGH (ref 65–100)
Glucose (POC): 190 mg/dL — ABNORMAL HIGH (ref 65–100)

## 2017-07-21 LAB — CULTURE, ANAEROBIC

## 2017-07-21 MED ORDER — PREDNISONE 5 MG TAB
5 mg | Freq: Every day | ORAL | Status: DC
Start: 2017-07-21 — End: 2017-07-23
  Administered 2017-07-21 – 2017-07-22 (×2): via ORAL

## 2017-07-21 MED ORDER — INSULIN NPH HUMAN RECOMB 100 UNIT/ML INJECTION
100 unit/mL | Freq: Every evening | SUBCUTANEOUS | Status: DC
Start: 2017-07-21 — End: 2017-07-22
  Administered 2017-07-22: 06:00:00 via SUBCUTANEOUS

## 2017-07-21 MED ORDER — TRIAMCINOLONE ACETONIDE 40 MG/ML SUSP FOR INJECTION
40 mg/mL | Freq: Once | INTRAMUSCULAR | Status: AC
Start: 2017-07-21 — End: 2017-07-22
  Administered 2017-07-22: 14:00:00 via INTRABURSAL

## 2017-07-21 MED ORDER — HYDROMORPHONE 2 MG/ML INJECTION SOLUTION
2 mg/mL | Freq: Four times a day (QID) | INTRAMUSCULAR | Status: DC | PRN
Start: 2017-07-21 — End: 2017-07-25
  Administered 2017-07-21 – 2017-07-25 (×12): via INTRAVENOUS

## 2017-07-21 MED ORDER — LIDOCAINE HCL 1 % (10 MG/ML) IJ SOLN
10 mg/mL (1 %) | Freq: Once | INTRAMUSCULAR | Status: AC
Start: 2017-07-21 — End: 2017-07-22
  Administered 2017-07-22: 14:00:00 via INTRADERMAL

## 2017-07-21 MED FILL — HYDROMORPHONE 2 MG/ML INJECTION SOLUTION: 2 mg/mL | INTRAMUSCULAR | Qty: 1

## 2017-07-21 MED FILL — CEFTRIAXONE 2 GRAM SOLUTION FOR INJECTION: 2 gram | INTRAMUSCULAR | Qty: 2

## 2017-07-21 MED FILL — AMLODIPINE 5 MG TAB: 5 mg | ORAL | Qty: 1

## 2017-07-21 MED FILL — COLCHICINE 0.6 MG TAB: 0.6 mg | ORAL | Qty: 1

## 2017-07-21 MED FILL — HEPARIN (PORCINE) 5,000 UNIT/ML IJ SOLN: 5000 unit/mL | INTRAMUSCULAR | Qty: 1

## 2017-07-21 MED FILL — NORMAL SALINE FLUSH 0.9 % INJECTION SYRINGE: INTRAMUSCULAR | Qty: 10

## 2017-07-21 MED FILL — SUCRALFATE 100 MG/ML ORAL SUSP: 100 mg/mL | ORAL | Qty: 10

## 2017-07-21 MED FILL — PANTOPRAZOLE 40 MG TAB, DELAYED RELEASE: 40 mg | ORAL | Qty: 1

## 2017-07-21 MED FILL — TRIAMTERENE-HYDROCHLOROTHIAZIDE 37.5 MG-25 MG TAB: ORAL | Qty: 1

## 2017-07-21 MED FILL — LIPITOR 20 MG TABLET: 20 mg | ORAL | Qty: 1

## 2017-07-21 MED FILL — INSULIN NPH HUMAN RECOMB 100 UNIT/ML INJECTION: 100 unit/mL | SUBCUTANEOUS | Qty: 1

## 2017-07-21 MED FILL — INSULIN LISPRO 100 UNIT/ML INJECTION: 100 unit/mL | SUBCUTANEOUS | Qty: 1

## 2017-07-21 MED FILL — SERTRALINE 50 MG TAB: 50 mg | ORAL | Qty: 2

## 2017-07-21 MED FILL — PREDNISONE 5 MG TAB: 5 mg | ORAL | Qty: 1

## 2017-07-21 NOTE — Progress Notes (Signed)
07/21/17; 10:30 -   CM participated in IDRs on patient and did independent chart review.  Patient is still believed to be experiencing new onset of gout in her foot.  If pain is persistent, patient may receive an injection to the foot tomorrow, 11/16.  CRM: Zollie BeckersEiren Childress, MPH, CHES; Z: [phone]

## 2017-07-21 NOTE — Other (Signed)
DTC Progress Note    Recommendations/ Comments: Chart review for hyperglycemia. Blood sugars 174-216 mg/dl and pt has required 10 units of correction over the last 24 hours. Noted Prednisone 5 mg added at breakfast today. Also note NPH dose increased from 24 units to 28 units hs starting tonight. DTC to continue to follow.     Current hospital DM medication:   Lispro resistant correction scale  NPH 28 units hs starting tonight  Pt also on Prednisone 5 mg     Chart reviewed on Dana [name].    Patient is a 61 y.o. female with known DM on NPH 32 units at night and lispro sliding scale AC & HS at home.    A1c:   Lab Results   Component Value Date/Time    Hemoglobin A1c 7.3 (H) 05/13/2017 03:04 AM    Hemoglobin A1c 7.2 (H) 01/31/2015 03:52 PM       Recent Glucose Results:   Lab Results   Component Value Date/Time    GLUCPOC 190 (H) 07/21/2017 06:31 AM    GLUCPOC 174 (H) 07/20/2017 08:47 PM    GLUCPOC 189 (H) 07/20/2017 05:16 PM        Lab Results   Component Value Date/Time    Creatinine 0.58 07/20/2017 02:16 AM     Estimated Creatinine Clearance: 124 mL/min (based on SCr of 0.58 mg/dL).    Active Orders   Diet    DIET DIABETIC CONSISTENT CARB Regular        PO intake:   No data found.    Will continue to follow as needed.    Thank you  Barbra SarksAmy Jackston [place], RD, CDE    Time spent in chart: 5 minutes

## 2017-07-21 NOTE — Progress Notes (Signed)
Spiritual Care Partner Volunteer visited patient in room 521/01 on 11.15.18.  Documented by: Chaplain: Rev. Cyndia Diverachel K. Cobb, MDiv; B[facility], to contact Spiritual Care Services call: [phone]

## 2017-07-21 NOTE — Progress Notes (Signed)
Bedside shift change report given to Jenny, RN (oncoming nurse) by Dana, RN (offgoing nurse). Report included the following information SBAR, Kardex, Intake/Output and MAR.

## 2017-07-21 NOTE — Progress Notes (Signed)
Medical Progress Note      NAME: Dana [name]   DOB:  11-Oct-1955  MRM:  [id number]    Date/Time: 07/21/2017  8:05 AM    Problem List:   Principal Problem:    Peritoneal abscess (HCC) (07/17/2017)    Active Problems:    Diabetes (HCC) (01/24/2013)      Hypertension complicating diabetes (HCC) (05/25/2016)      Morbid obesity due to excess calories (HCC) (05/25/2016)      Acute gastric ulcer with perforation (HCC) (05/13/2017)      Overview: S/p laporascopic repair and closure 05/12/2017           Subjective:     Patient still having excruciating pain left foot.  She is unable to bear weight on it. She in allergic to nsaids and high dose steroids are contraindicated due to gastric perforation. Will try low dose prednisone to help wean her off dilaudid.    Past Medical History:   Diagnosis Date   ??? Diabetes (HCC) 01/24/2013   ??? GERD (gastroesophageal reflux disease)    ??? Hives    ??? HTN (hypertension)    ??? Hypercholesterolemia    ??? Perforated gastric ulcer (HCC)        ROS:  General: negative for fever, chills, sweats, weakness  Respiratory:  negative for cough, sputum production, SOB, wheezing, DOE, pleuritic pain  Cardiology:  negative for chest pain, palpitations, orthopnea, PND, edema, syncope   Gastrointestinal: negative for abdominal pain, N/V, dysphagia, change in bowel habits, bleeding         Objective:       Vitals:          Last 24hrs VS reviewed since prior progress note. Most recent are:    Visit Vitals  BP 126/81 (BP 1 Location: Right arm, BP Patient Position: At rest;Head of bed elevated (Comment degrees))   Pulse 99   Temp 99.6 ??F (37.6 ??C)   Resp 18   Ht 5\' 4"  (1.626 m)   Wt 233 lb 11 oz (106 kg)   SpO2 93%   BMI 40.11 kg/m??     SpO2 Readings from Last 6 Encounters:   07/20/17 93%   07/14/17 96%   06/09/17 98%   05/23/17 98%   05/18/17 93%   05/20/15 94%    O2 Flow Rate (L/min): 2 l/min       Intake/Output Summary (Last 24 hours) at 07/21/2017 0805  Last data filed at 07/21/2017 [id number]  Gross per 24 hour    Intake 20 ml   Output 1460 ml   Net -1440 ml          Exam:     General   Obese 61 yo wf  Respiratory   Clear To Auscultation bilaterally - no wheezes, rales, rhonchi, or crackles  Cardiology  regular  Abdominal  Soft, non-tender, non-distended, positive bowel sounds, no hepatosplenomegaly  Extremities  Left foot edematous and erythematous- good pulses. Very tender on palpation    Lab Data Reviewed: (see below)    Medications Reviewed: (see below)    ______________________________________________________________________    Medications:     Current Facility-Administered Medications   Medication Dose Route Frequency   ??? predniSONE (DELTASONE) tablet 5 mg  5 mg Oral DAILY WITH BREAKFAST   ??? HYDROmorphone (DILAUDID) injection 1 mg  1 mg IntraVENous Q6H PRN   ??? cefTRIAXone (ROCEPHIN) 2 g in 0.9% sodium chloride (MBP/ADV) 50 mL  2 g IntraVENous Q24H   ??? insulin NPH (  NOVOLIN N, HUMULIN N) injection 28 Units  28 Units SubCUTAneous QHS   ??? HYDROcodone-acetaminophen (NORCO) 5-325 mg per tablet 1 Tab  1 Tab Oral Q4H PRN   ??? colchicine tablet 0.6 mg  0.6 mg Oral TID   ??? sodium chloride (NS) flush 5-10 mL  5-10 mL IntraVENous RAD PRN   ??? sucralfate (CARAFATE) 100 mg/mL oral suspension 1 g  1 g Oral AC&HS   ??? pantoprazole (PROTONIX) tablet 40 mg  40 mg Oral BID   ??? sertraline (ZOLOFT) tablet 100 mg  100 mg Oral DAILY   ??? traMADol (ULTRAM) tablet 50 mg  50 mg Oral Q8H PRN   ??? triamterene-hydroCHLOROthiazide (MAXZIDE) 37.5-25 mg per tablet 1 Tab  1 Tab Oral DAILY   ??? insulin lispro (HUMALOG) injection   SubCUTAneous TIDAC   ??? glucose chewable tablet 16 g  4 Tab Oral PRN   ??? dextrose (D50W) injection syrg 12.5-25 g  12.5-25 g IntraVENous PRN   ??? glucagon (GLUCAGEN) injection 1 mg  1 mg IntraMUSCular PRN   ??? zolpidem (AMBIEN) tablet 5 mg  5 mg Oral QHS PRN   ??? sodium chloride (NS) flush 5-10 mL  5-10 mL IntraVENous Q8H   ??? sodium chloride (NS) flush 5-10 mL  5-10 mL IntraVENous PRN    ??? acetaminophen (TYLENOL) tablet 650 mg  650 mg Oral Q4H PRN   ??? heparin (porcine) injection 5,000 Units  5,000 Units SubCUTAneous Q8H   ??? amLODIPine (NORVASC) tablet 5 mg  5 mg Oral DAILY   ??? atorvastatin (LIPITOR) tablet 20 mg  20 mg Oral QHS            Lab Review:     Recent Labs     07/20/17  0216   WBC 8.2   HGB 11.2*   HCT 34.9*   PLT 187     Recent Labs     07/20/17  0216   NA 133*   K 2.9*   CL 96*   CO2 25   GLU 217*   BUN 11   CREA 0.58   CA 8.5     Lab Results   Component Value Date/Time    Glucose (POC) 190 (H) 07/21/2017 06:31 AM    Glucose (POC) 174 (H) 07/20/2017 08:47 PM    Glucose (POC) 189 (H) 07/20/2017 05:16 PM    Glucose (POC) 212 (H) 07/20/2017 11:11 AM    Glucose (POC) 216 (H) 07/20/2017 06:48 AM     No results for input(s): PH, PCO2, PO2, HCO3, FIO2 in the last 72 hours.  No results for input(s): INR in the last 72 hours.    No lab exists for component: INREXT    Other pertinent lab: NA         Assessment:     Patient Active Problem List   Diagnosis Code   ??? Hypercholesterolemia E78.00   ??? Diabetes (HCC) E11.9   ??? Sleep apnea G47.30   ??? Hypertension complicating diabetes (HCC) E11.59, I10   ??? Morbid obesity due to excess calories (HCC) E66.01   ??? Hives L50.9   ??? Acute gastric ulcer with perforation (HCC) K25.1   ??? Arthralgia of both knees M25.561, M25.562   ??? Abdominal pain R10.9   ??? Peritoneal abscess (HCC) K65.1          Plan:                 1. Gastric perf s/p repair with adjacent fluid collection- catheter in  place. On rocecphin  2. Diabetes- increase lantus  3. Gout- on colchicine, add low dose steroids. She is allergic to nsaids   4. dvt prophylaxis  5. hypertension - fairly well controlled              ___________________________________________________    Attending Physician: Lake Bells, MD

## 2017-07-21 NOTE — Progress Notes (Signed)
Progress Note    Patient: Dana SeatRebecca D Hagans MRN: [id number]  SSN: [id number]    Date of Birth: 04-17-1956  Age: 61 y.o.  Sex: female      Admit Date: 07/17/2017    * No surgery found *    Procedure:      Subjective:     Patient  With No new abdominal pain .Tolerating diet.       Objective:     Visit Vitals  BP 129/84 (BP 1 Location: Right arm, BP Patient Position: At rest;Head of bed elevated (Comment degrees))   Pulse 81   Temp 98.4 ??F (36.9 ??C)   Resp 18   Ht 5\' 4"  (1.626 m)   Wt 233 lb 11 oz (106 kg)   SpO2 94%   BMI 40.11 kg/m??       Temp (24hrs), Avg:98.6 ??F (37 ??C), Min:97.9 ??F (36.6 ??C), Max:99.6 ??F (37.6 ??C)      Physical Exam:    Gen- Alert in NAD  Abd- Soft / still with purulent drainage  Left foot swollen     Data Review: images and reports reviewed    Lab Review:   All lab results for the last 24 hours reviewed.    Assessment:     Hospital Problems  Date Reviewed: 07/13/2017          Codes Class Noted POA    * (Principal) Peritoneal abscess (HCC) ICD-10-CM: K65.1  ICD-9-CM: 567.22  07/17/2017 Yes        Acute gastric ulcer with perforation (HCC) ICD-10-CM: K25.1  ICD-9-CM: 531.10  05/13/2017 Yes    Overview Signed 07/17/2017  5:19 PM by Prudencio Pairitus, C Kent, MD     S/p laporascopic repair and closure 05/12/2017             Hypertension complicating diabetes (HCC) ICD-10-CM: E11.59, I10  ICD-9-CM: 250.80, 401.9  05/25/2016 Yes        Morbid obesity due to excess calories [facility](HCC) ICD-10-CM: E66.01  ICD-9-CM: 278.01  05/25/2016 Yes        Diabetes (HCC) ICD-10-CM: E11.9  ICD-9-CM: 250.00  01/24/2013 Yes              Plan/Recommendations/Medical Decision Making:   ABX changed  Ceftriaxone though could switch to bactrim on discharge   Will need to monitor now that she is going back on steroids.  Continue Diet.     Signed By: Hoy FinlayStuart Jon Lall, MD     July 21, 2017

## 2017-07-22 LAB — GLUCOSE, POC
Glucose (POC): 235 mg/dL — ABNORMAL HIGH (ref 65–100)
Glucose (POC): 206 mg/dL — ABNORMAL HIGH (ref 65–100)
Glucose (POC): 235 mg/dL — ABNORMAL HIGH (ref 65–100)
Glucose (POC): 278 mg/dL — ABNORMAL HIGH (ref 65–100)

## 2017-07-22 MED ORDER — INSULIN NPH HUMAN RECOMB 100 UNIT/ML INJECTION
100 unit/mL | Freq: Every evening | SUBCUTANEOUS | Status: DC
Start: 2017-07-22 — End: 2017-07-26
  Administered 2017-07-23 – 2017-07-26 (×4): via SUBCUTANEOUS

## 2017-07-22 MED FILL — HEPARIN (PORCINE) 5,000 UNIT/ML IJ SOLN: 5000 unit/mL | INTRAMUSCULAR | Qty: 1

## 2017-07-22 MED FILL — INSULIN LISPRO 100 UNIT/ML INJECTION: 100 unit/mL | SUBCUTANEOUS | Qty: 1

## 2017-07-22 MED FILL — LIDOCAINE HCL 1 % (10 MG/ML) IJ SOLN: 10 mg/mL (1 %) | INTRAMUSCULAR | Qty: 10

## 2017-07-22 MED FILL — CEFTRIAXONE 2 GRAM SOLUTION FOR INJECTION: 2 gram | INTRAMUSCULAR | Qty: 2

## 2017-07-22 MED FILL — NORMAL SALINE FLUSH 0.9 % INJECTION SYRINGE: INTRAMUSCULAR | Qty: 10

## 2017-07-22 MED FILL — HYDROCODONE-ACETAMINOPHEN 5 MG-325 MG TAB: 5-325 mg | ORAL | Qty: 1

## 2017-07-22 MED FILL — HYDROMORPHONE 2 MG/ML INJECTION SOLUTION: 2 mg/mL | INTRAMUSCULAR | Qty: 1

## 2017-07-22 MED FILL — COLCHICINE 0.6 MG TAB: 0.6 mg | ORAL | Qty: 1

## 2017-07-22 MED FILL — TRIAMTERENE-HYDROCHLOROTHIAZIDE 37.5 MG-25 MG TAB: ORAL | Qty: 1

## 2017-07-22 MED FILL — SUCRALFATE 100 MG/ML ORAL SUSP: 100 mg/mL | ORAL | Qty: 10

## 2017-07-22 MED FILL — LIPITOR 20 MG TABLET: 20 mg | ORAL | Qty: 1

## 2017-07-22 MED FILL — SERTRALINE 50 MG TAB: 50 mg | ORAL | Qty: 2

## 2017-07-22 MED FILL — PANTOPRAZOLE 40 MG TAB, DELAYED RELEASE: 40 mg | ORAL | Qty: 1

## 2017-07-22 MED FILL — INSULIN NPH HUMAN RECOMB 100 UNIT/ML INJECTION: 100 unit/mL | SUBCUTANEOUS | Qty: 1

## 2017-07-22 MED FILL — AMLODIPINE 5 MG TAB: 5 mg | ORAL | Qty: 1

## 2017-07-22 MED FILL — LIDOCAINE HCL 1 % (10 MG/ML) IJ SOLN: 10 mg/mL (1 %) | INTRAMUSCULAR | Qty: 20

## 2017-07-22 MED FILL — TRAMADOL 50 MG TAB: 50 mg | ORAL | Qty: 1

## 2017-07-22 MED FILL — KENALOG 40 MG/ML SUSPENSION FOR INJECTION: 40 mg/mL | INTRAMUSCULAR | Qty: 0.5

## 2017-07-22 MED FILL — PREDNISONE 5 MG TAB: 5 mg | ORAL | Qty: 1

## 2017-07-22 NOTE — Other (Signed)
DTC Progress Note    Recommendations/ Comments: Chart review for hyperglycemia.   Blood sugars 190-235 mg/dl and pt has required 10 units of correction over the last 24 hours.   Noted Prednisone 5 mg at breakfast today.   Also note NPH dose increased from 28 units to 22 units hs starting tonight.     If appropriate please consider   Addition of lispro 4 units AC    DTC to continue to follow.     Current hospital DM medication:   Lispro resistant correction scale  NPH 32 units hs starting tonight  Pt also on Prednisone 5 mg     Chart reviewed on Dana [name].    Patient is a 61 y.o. female with known DM on NPH 32 units at night and lispro sliding scale AC & HS at home.    A1c:   Lab Results   Component Value Date/Time    Hemoglobin A1c 7.3 (H) 05/13/2017 03:04 AM    Hemoglobin A1c 7.2 (H) 01/31/2015 03:52 PM       Recent Glucose Results:   Lab Results   Component Value Date/Time    GLUCPOC 235 (H) 07/22/2017 11:43 AM    GLUCPOC 206 (H) 07/22/2017 06:42 AM    GLUCPOC 235 (H) 07/22/2017 01:00 AM        Lab Results   Component Value Date/Time    Creatinine 0.58 07/20/2017 02:16 AM     Estimated Creatinine Clearance: 124 mL/min (based on SCr of 0.58 mg/dL).    Active Orders   Diet    DIET DIABETIC CONSISTENT CARB Regular        PO intake:   No data found.    Will continue to follow as needed.    Thank you  Higinio PlanSallie P. Verner Kopischke RN, CDE  Pager [phone]

## 2017-07-22 NOTE — Progress Notes (Addendum)
Order received for home health--nursing and PT.  Cm talked with patient and she [facility] Freedom of choice letter completed, signed and placed in chart [facility]    Referral sent to the agency via connect care --and denied..      Patient then chose [phone].   Referral sent via allscripts and accepted. .  Name and number placed on discharge instructions.  Patient advised to call agency if not contacted by noon the day after discharge to inquire as to the day and time of initial visit    Husband will transport patient home.  Patient plans to return to work at the [employer] ([address]) when medically ready.

## 2017-07-22 NOTE — Progress Notes (Signed)
Problem: Mobility Impaired (Adult and Pediatric)  Goal: *Acute Goals and Plan of Care (Insert Text)  Physical Therapy Goals  Initiated 07/22/2017  1.  Patient will move from supine to sit and sit to supine  in bed with modified independence within 7 day(s).    2.  Patient will transfer from bed to chair and chair to bed with modified independence using the least restrictive device within 7 day(s).  3.  Patient will perform sit to stand with modified independence within 7 day(s).  4.  Patient will ambulate with modified independence for 20 feet with the least restrictive device within 7 day(s).   5.  Patient will ascend/descend 4 stairs with 1 handrail(s) with modified independence within 7 day(s).  physical Therapy EVALUATION  Patient: Dana [name] ([age] y.o. female)  Date: 07/22/2017  Primary Diagnosis: Peritoneal abscess (HCC)       Precautions:   Fall(gout in L foot)    ASSESSMENT :  Based on the objective data described below, the patient presents with decreased functional mobility from baseline level of function.  Prior to admit patient was independent with mobility and is normally independent and working.  Currently living with family in a 2 level home with STE to steps to bedroom.  Patient currently limited by L ankle/foot secondary to pain.  Currently needing supervision for bed mobility and transfers.  [facility]x [facility] but cannot bear weight fully on foot.  Returned to room with supervision and needs extra time to complete task. Anticipate as pain will improve mobility will improve.  Will follow for mobility progression.  Don't anticipate any HH PT needs if pain improves.    Patient will benefit from skilled intervention to address the above impairments.  Patient???s rehabilitation potential is considered to be Good  Factors which may influence rehabilitation potential include:   []          None noted  []          Mental ability/status  []          Medical condition   []          Home/family situation and support systems  []          Safety awareness  [x]          Pain tolerance/management  []          Other:      PLAN :  Recommendations and Planned Interventions:  [x]            Bed Mobility Training             [x]     Neuromuscular Re-Education  [x]            Transfer Training                   []     Orthotic/Prosthetic Training  [x]            Gait Training                         []     Modalities  [x]            Therapeutic Exercises           []     Edema Management/Control  [x]            Therapeutic Activities            [x]     Patient and Family Training/Education  []   Other (comment):    Frequency/Duration: Patient will be followed by physical therapy  5 times a week to address goals.  Discharge Recommendations: Home Health VS None  Further Equipment Recommendations for Discharge: none     SUBJECTIVE:   Patient stated ???If I could put my foot down and walk then I'd be fine.???    OBJECTIVE DATA SUMMARY:   HISTORY:    Past Medical History:   Diagnosis Date   ??? Diabetes (HCC) 01/24/2013   ??? GERD (gastroesophageal reflux disease)    ??? Hives    ??? HTN (hypertension)    ??? Hypercholesterolemia    ??? Perforated gastric ulcer (HCC)      Past Surgical History:   Procedure Laterality Date   ??? ABDOMEN SURGERY PROC UNLISTED  05/12/2017    Lap exploratory perforated posterior ulcer repair by Dr. Amada Jupiter    ??? HX HYSTERECTOMY       Prior Level of Function/Home Situation: Independent with mobility at baseline.  Works at [employer] usually  Personal factors and/or comorbidities impacting plan of care:     Home Situation  Home Environment: Private residence  # Steps to Enter: 4  Rails to Enter: Yes  One/Two Story Residence: Two story  Living Alone: No  Support Systems: Family member(s)  Patient Expects to be Discharged to:: Private residence  EXAMINATION/PRESENTATION/DECISION MAKING: Critical Behavior:  Neurologic State: Alert  Orientation Level: Oriented X4        Hearing:  Auditory   Auditory Impairment: None  Range Of Motion:  AROM: Generally decreased, functional                       Strength:    Strength: Generally decreased, functional                    Tone & Sensation:                                  Coordination:     Vision:      Functional Mobility:  Bed Mobility:     Supine to Sit: Supervision     Scooting: Supervision  Transfers:  Sit to Stand: Stand-by assistance  Stand to Sit: Stand-by assistance                       Balance:   Sitting: Intact  Standing: Impaired  Standing - Static: Constant support;Good  Standing - Dynamic : FairAmbulation/Gait Training:Distance (ft): 1 Feet (ft)  Assistive Device: Gait belt;Walker, rolling  Ambulation - Level of Assistance: Contact guard assistance     Gait Description (WDL): Exceptions to WDL  Gait Abnormalities: Antalgic;Decreased step clearance        Base of Support: Narrowed  Stance: Left decreased  Speed/Cadence: Pace decreased (<100 feet/min);Slow  Step Length: Left shortened;Right shortened          Functional Measure:  Barthel Index:    Bathing: 0  Bladder: 10  Bowels: 10  Grooming: 5  Dressing: 5  Feeding: 10  Mobility: 10  Stairs: 5  Toilet Use: 5  Transfer (Bed to Chair and Back): 10  Total: 70      Barthel and G-code impairment scale:  Percentage of impairment CH  0% CI  1-19% CJ  20-39% CK  40-59% CL  60-79% CM  80-99% CN  100%   Barthel Score 0-100 100 99-80 79-60 59-40  20-39 1-19   0   Barthel Score 0-20 20 17-19 13-16 9-12 5-8 1-4 0      The Barthel ADL Index: Guidelines  1. The index should be used as a record of what a patient does, not as a record of what a patient could do.  2. The main aim is to establish degree of independence from any help, physical or verbal, however minor and for whatever reason.  3. The need for supervision renders the patient not independent.  4. A patient's performance should be established using the best available evidence. Asking the patient, friends/relatives and nurses are the usual  sources, but direct observation and common sense are also important. However direct testing is not needed.  5. Usually the patient's performance over the preceding 24-48 hours is important, but occasionally longer periods will be relevant.  6. Middle categories imply that the patient supplies over 50 per cent of the effort.  7. Use of aids to be independent is allowed.    Clarisa Kindred., Barthel, D.W. [id number]). Functional evaluation: the Barthel Index. Md State Med J (14)2.  Zenaida Niece der [place], J.J.M.F, [place], Ian Malkin., Margret Chance., [place], [place]. (1999). Measuring the change indisability after inpatient rehabilitation; comparison of the responsiveness of the Barthel Index and Functional Independence Measure. Journal of Neurology, Neurosurgery, and Psychiatry, 66(4), [phone].  Dawson Bills, N.J.A, [place],  W.J.M, & [place], M.A. (2004.) Assessment of post-stroke quality of life in cost-effectiveness studies: The usefulness of the Barthel Index and the EuroQoL-5D. Quality of Life Research, 13, 098-11       G codes:  In compliance with CMS???s Claims Based Outcome Reporting, the following G-code set was chosen for this patient based on their primary functional limitation being treated:    The outcome measure chosen to determine the severity of the functional limitation was the Barthel with a score of 70/100 which was correlated with the impairment scale.    ? Mobility - Walking and Moving Around:    [id number] - CURRENT STATUS: CJ - 20%-39% impaired, limited or restricted   G9562 - GOAL STATUS: CI - 1%-19% impaired, limited or restricted   Z3086 - D/C STATUS:  ---------------To be determined---------------       Pain:  Pain Scale 1: Numeric (0 - 10)  Pain Intensity 1: 6              Activity Tolerance:   VSS  Please refer to the flowsheet for vital signs taken during this treatment.  After treatment:   []          Patient left in no apparent distress sitting up in chair   [x]          Patient left in no apparent distress in bed  [x]          Call bell left within reach  [x]          Nursing notified  []          Caregiver present  []          Bed alarm activated    COMMUNICATION/EDUCATION:   The patient???s plan of care was discussed with: Physical Therapist, Occupational Therapist and Registered Nurse.  [x]          Fall prevention education was provided and the patient/caregiver indicated understanding.  [x]          Patient/family have participated as able in goal setting and plan of care.  [x]          Patient/family agree to work toward  stated goals and plan of care.  []          Patient understands intent and goals of therapy, but is neutral about his/her participation.  []          Patient is unable to participate in goal setting and plan of care.    Thank you for this referral.  Leverne HumblesSarah E Miriah [name], PT, DPT   Time Calculation: 16 mins

## 2017-07-22 NOTE — Progress Notes (Signed)
Medical Progress Note      NAME: Dana [name]   DOB:  02/17/1956  MRM:  [id number]    Date/Time: 07/22/2017  10:43 AM    Problem List:   Principal Problem:    Peritoneal abscess (HCC) (07/17/2017)    Active Problems:    Diabetes (HCC) (01/24/2013)      Hypertension complicating diabetes (HCC) (05/25/2016)      Morbid obesity due to excess calories (HCC) (05/25/2016)      Acute gastric ulcer with perforation (HCC) (05/13/2017)      Overview: S/p laporascopic repair and closure 05/12/2017           Subjective:     Patient notes some improvement in foot pain.      Past Medical History:   Diagnosis Date   ??? Diabetes (HCC) 01/24/2013   ??? GERD (gastroesophageal reflux disease)    ??? Hives    ??? HTN (hypertension)    ??? Hypercholesterolemia    ??? Perforated gastric ulcer (HCC)        ROS:  General: negative for fever, chills, sweats, weakness  Respiratory:  negative for cough, sputum production, SOB, wheezing, DOE, pleuritic pain  Cardiology:  negative for chest pain, palpitations, orthopnea, PND, edema, syncope          Objective:       Vitals:          Last 24hrs VS reviewed since prior progress note. Most recent are:    Visit Vitals  BP 123/86   Pulse 83   Temp 98 ??F (36.7 ??C)   Resp 20   Ht 5\' 4"  (1.626 m)   Wt 233 lb 11 oz (106 kg)   SpO2 93%   BMI 40.11 kg/m??     SpO2 Readings from Last 6 Encounters:   07/22/17 93%   07/14/17 96%   06/09/17 98%   05/23/17 98%   05/18/17 93%   05/20/15 94%    O2 Flow Rate (L/min): 2 l/min       Intake/Output Summary (Last 24 hours) at 07/22/2017 1043  Last data filed at 07/22/2017 [id number]  Gross per 24 hour   Intake ???   Output 800 ml   Net -800 ml          Exam:     General   Obese wf  Respiratory   Clear To Auscultation bilaterally - no wheezes, rales, rhonchi, or crackles  Cardiology  regular  Abdominal  Soft, minimal tenderness around percutaneous catheter sitr  Extremities  Left foot decreased erythema.  Still tender , good pulses    Lab Data Reviewed: (see below)     Medications Reviewed: (see below)    ______________________________________________________________________    Medications:     Current Facility-Administered Medications   Medication Dose Route Frequency   ??? insulin NPH (NOVOLIN N, HUMULIN N) injection 32 Units  32 Units SubCUTAneous QHS   ??? predniSONE (DELTASONE) tablet 5 mg  5 mg Oral DAILY WITH BREAKFAST   ??? HYDROmorphone (DILAUDID) injection 1 mg  1 mg IntraVENous Q6H PRN   ??? cefTRIAXone (ROCEPHIN) 2 g in 0.9% sodium chloride (MBP/ADV) 50 mL  2 g IntraVENous Q24H   ??? HYDROcodone-acetaminophen (NORCO) 5-325 mg per tablet 1 Tab  1 Tab Oral Q4H PRN   ??? colchicine tablet 0.6 mg  0.6 mg Oral TID   ??? sodium chloride (NS) flush 5-10 mL  5-10 mL IntraVENous RAD PRN   ??? sucralfate (CARAFATE) 100 mg/mL oral suspension 1  g  1 g Oral AC&HS   ??? pantoprazole (PROTONIX) tablet 40 mg  40 mg Oral BID   ??? sertraline (ZOLOFT) tablet 100 mg  100 mg Oral DAILY   ??? traMADol (ULTRAM) tablet 50 mg  50 mg Oral Q8H PRN   ??? triamterene-hydroCHLOROthiazide (MAXZIDE) 37.5-25 mg per tablet 1 Tab  1 Tab Oral DAILY   ??? insulin lispro (HUMALOG) injection   SubCUTAneous TIDAC   ??? glucose chewable tablet 16 g  4 Tab Oral PRN   ??? dextrose (D50W) injection syrg 12.5-25 g  12.5-25 g IntraVENous PRN   ??? glucagon (GLUCAGEN) injection 1 mg  1 mg IntraMUSCular PRN   ??? zolpidem (AMBIEN) tablet 5 mg  5 mg Oral QHS PRN   ??? sodium chloride (NS) flush 5-10 mL  5-10 mL IntraVENous Q8H   ??? sodium chloride (NS) flush 5-10 mL  5-10 mL IntraVENous PRN   ??? acetaminophen (TYLENOL) tablet 650 mg  650 mg Oral Q4H PRN   ??? heparin (porcine) injection 5,000 Units  5,000 Units SubCUTAneous Q8H   ??? amLODIPine (NORVASC) tablet 5 mg  5 mg Oral DAILY   ??? atorvastatin (LIPITOR) tablet 20 mg  20 mg Oral QHS            Lab Review:     Recent Labs     07/20/17  0216   WBC 8.2   HGB 11.2*   HCT 34.9*   PLT 187     Recent Labs     07/20/17  0216   NA 133*   K 2.9*   CL 96*   CO2 25   GLU 217*   BUN 11   CREA 0.58   CA 8.5      Lab Results   Component Value Date/Time    Glucose (POC) 206 (H) 07/22/2017 06:42 AM    Glucose (POC) 235 (H) 07/22/2017 01:00 AM    Glucose (POC) 190 (H) 07/21/2017 04:56 PM    Glucose (POC) 213 (H) 07/21/2017 11:58 AM    Glucose (POC) 190 (H) 07/21/2017 06:31 AM     No results for input(s): PH, PCO2, PO2, HCO3, FIO2 in the last 72 hours.  No results for input(s): INR in the last 72 hours.    No lab exists for component: INREXT    Other pertinent lab: NA         Assessment:     Patient Active Problem List   Diagnosis Code   ??? Hypercholesterolemia E78.00   ??? Diabetes (HCC) E11.9   ??? Sleep apnea G47.30   ??? Hypertension complicating diabetes (HCC) E11.59, I10   ??? Morbid obesity due to excess calories (HCC) E66.01   ??? Hives L50.9   ??? Acute gastric ulcer with perforation (HCC) K25.1   ??? Arthralgia of both knees M25.561, M25.562   ??? Abdominal pain R10.9   ??? Peritoneal abscess (HCC) K65.1          Plan:                 1. Gastric perf- s/p percutaneous drainage- on rocephin  2. Gout- stop oral steroids.  Foot injected today, continue colchicine  3. Diabetes- increase lantus  4. Hypertension= continue meds  5. Medically stable for discharge when she can bear weight and surgery ok with discharge       Procedure:  left Foot prepped with chloroprep and alcohol.  Injected lateral aspect with 20 mg tac and 0.5 cc lidocaine. No complications.  ___________________________________________________    Attending Physician: Genene Churn, MD

## 2017-07-22 NOTE — Progress Notes (Signed)
Ms. Nedra HaiLee still reports foot pain. She had a steroid injection in her foot this AM.  Tm 98.6 HR: 83 BP: 123/86 Resp Rate: 20 93% sat on room air.    Intake/Output Summary (Last 24 hours) at 07/22/2017 1129  Last data filed at 07/22/2017 0707  Gross per 24 hour   Intake ???   Output 800 ml   Net -800 ml   Exam: Cor: RRR.              Lungs: Bilateral breath sounds. Clear to auscultation.              Abd: Soft. Tender at drain site.            No guarding or rebound.            Non distended.            Drain output is purulent.  Labs:   Recent Results (from the past 12 hour(s))   GLUCOSE, POC    Collection Time: 07/22/17  1:00 AM   Result Value Ref Range    Glucose (POC) 235 (H) 65 - 100 mg/dL    Performed by Donzetta KohutKenny Dana    GLUCOSE, POC    Collection Time: 07/22/17  6:42 AM   Result Value Ref Range    Glucose (POC) 206 (H) 65 - 100 mg/dL    Performed by Tobias AlexanderMiller Katarina    Diet as tolerated.   Leave drain for now.   Continue IV abx - Rocephin.   BID Protonix and Carafate as ordered.  Will ask PT to see.   DVT Prophylaxis - SC Heparin.   Discharge planning.

## 2017-07-22 NOTE — Progress Notes (Signed)
Bedside shift change report given to Maria, RN (oncoming nurse) by Dana, RN (offgoing nurse). Report included the following information SBAR, Kardex, Intake/Output and MAR.

## 2017-07-22 NOTE — Progress Notes (Signed)
Bedside and Verbal shift change report given to Roxanne, RN  (oncoming nurse) by maria (offgoing nurse). Report included the following information SBAR and Kardex.

## 2017-07-23 LAB — METABOLIC PANEL, BASIC
Anion gap: 9 mmol/L (ref 5–15)
BUN/Creatinine ratio: 28 — ABNORMAL HIGH (ref 12–20)
BUN: 16 MG/DL (ref 6–20)
CO2: 27 mmol/L (ref 21–32)
Calcium: 9.2 MG/DL (ref 8.5–10.1)
Chloride: 99 mmol/L (ref 97–108)
Creatinine: 0.58 MG/DL (ref 0.55–1.02)
GFR est AA: >60 mL/min/{1.73_m2} (ref >60)
GFR est non-AA: >60 mL/min/{1.73_m2} (ref >60)
Glucose: 184 mg/dL — ABNORMAL HIGH (ref 65–100)
Potassium: 2.8 mmol/L — ABNORMAL LOW (ref 3.5–5.1)
Sodium: 135 mmol/L — ABNORMAL LOW (ref 136–145)

## 2017-07-23 LAB — GLUCOSE, POC
Glucose (POC): 209 mg/dL — ABNORMAL HIGH (ref 65–100)
Glucose (POC): 191 mg/dL — ABNORMAL HIGH (ref 65–100)
Glucose (POC): 197 mg/dL — ABNORMAL HIGH (ref 65–100)
Glucose (POC): 206 mg/dL — ABNORMAL HIGH (ref 65–100)

## 2017-07-23 MED FILL — COLCHICINE 0.6 MG TAB: 0.6 mg | ORAL | Qty: 1

## 2017-07-23 MED FILL — INSULIN LISPRO 100 UNIT/ML INJECTION: 100 unit/mL | SUBCUTANEOUS | Qty: 1

## 2017-07-23 MED FILL — PANTOPRAZOLE 40 MG TAB, DELAYED RELEASE: 40 mg | ORAL | Qty: 1

## 2017-07-23 MED FILL — INSULIN NPH HUMAN RECOMB 100 UNIT/ML INJECTION: 100 unit/mL | SUBCUTANEOUS | Qty: 1

## 2017-07-23 MED FILL — TRIAMTERENE-HYDROCHLOROTHIAZIDE 37.5 MG-25 MG TAB: ORAL | Qty: 1

## 2017-07-23 MED FILL — SERTRALINE 50 MG TAB: 50 mg | ORAL | Qty: 2

## 2017-07-23 MED FILL — HYDROMORPHONE 2 MG/ML INJECTION SOLUTION: 2 mg/mL | INTRAMUSCULAR | Qty: 1

## 2017-07-23 MED FILL — AMLODIPINE 5 MG TAB: 5 mg | ORAL | Qty: 1

## 2017-07-23 MED FILL — NORMAL SALINE FLUSH 0.9 % INJECTION SYRINGE: INTRAMUSCULAR | Qty: 10

## 2017-07-23 MED FILL — TRAMADOL 50 MG TAB: 50 mg | ORAL | Qty: 1

## 2017-07-23 MED FILL — SUCRALFATE 100 MG/ML ORAL SUSP: 100 mg/mL | ORAL | Qty: 10

## 2017-07-23 MED FILL — HEPARIN (PORCINE) 5,000 UNIT/ML IJ SOLN: 5000 unit/mL | INTRAMUSCULAR | Qty: 1

## 2017-07-23 MED FILL — LIPITOR 20 MG TABLET: 20 mg | ORAL | Qty: 1

## 2017-07-23 MED FILL — CEFTRIAXONE 2 GRAM SOLUTION FOR INJECTION: 2 gram | INTRAMUSCULAR | Qty: 2

## 2017-07-23 MED FILL — ZOLPIDEM 5 MG TAB: 5 mg | ORAL | Qty: 1

## 2017-07-23 NOTE — Progress Notes (Signed)
Bedside and Verbal shift change report given to Maria RN (oncoming nurse) by Roxanne Rn (offgoing nurse). Report included the following information SBAR.

## 2017-07-23 NOTE — Progress Notes (Signed)
Bedside and Verbal shift change report given to Lisa Rn (oncoming nurse) by Roxanne Rn (offgoing nurse). Report included the following information SBAR.

## 2017-07-23 NOTE — Progress Notes (Signed)
Bedside and Verbal shift change report given to Roxanne (oncoming nurse) by Sarah (offgoing nurse). Report included the following information SBAR, Kardex, MAR and Recent Results.

## 2017-07-23 NOTE — Progress Notes (Signed)
Drs. Kinnie Scalesitus, Mila PalmerHendrix, Locklan Canoy, Cherly HensenPahle, and ChristensenAdmit Date: 07/17/2017      Subjective:     Patient L foot with some lass pain. Able to put pressure on it now but minimal walking. Abd continues to improve. .       Current Facility-Administered Medications   Medication Dose Route Frequency   ??? insulin NPH (NOVOLIN N, HUMULIN N) injection 32 Units  32 Units SubCUTAneous QHS   ??? HYDROmorphone (DILAUDID) injection 1 mg  1 mg IntraVENous Q6H PRN   ??? cefTRIAXone (ROCEPHIN) 2 g in 0.9% sodium chloride (MBP/ADV) 50 mL  2 g IntraVENous Q24H   ??? HYDROcodone-acetaminophen (NORCO) 5-325 mg per tablet 1 Tab  1 Tab Oral Q4H PRN   ??? colchicine tablet 0.6 mg  0.6 mg Oral TID   ??? sodium chloride (NS) flush 5-10 mL  5-10 mL IntraVENous RAD PRN   ??? sucralfate (CARAFATE) 100 mg/mL oral suspension 1 g  1 g Oral AC&HS   ??? pantoprazole (PROTONIX) tablet 40 mg  40 mg Oral BID   ??? sertraline (ZOLOFT) tablet 100 mg  100 mg Oral DAILY   ??? traMADol (ULTRAM) tablet 50 mg  50 mg Oral Q8H PRN   ??? triamterene-hydroCHLOROthiazide (MAXZIDE) 37.5-25 mg per tablet 1 Tab  1 Tab Oral DAILY   ??? insulin lispro (HUMALOG) injection   SubCUTAneous TIDAC   ??? glucose chewable tablet 16 g  4 Tab Oral PRN   ??? dextrose (D50W) injection syrg 12.5-25 g  12.5-25 g IntraVENous PRN   ??? glucagon (GLUCAGEN) injection 1 mg  1 mg IntraMUSCular PRN   ??? zolpidem (AMBIEN) tablet 5 mg  5 mg Oral QHS PRN   ??? sodium chloride (NS) flush 5-10 mL  5-10 mL IntraVENous Q8H   ??? sodium chloride (NS) flush 5-10 mL  5-10 mL IntraVENous PRN   ??? acetaminophen (TYLENOL) tablet 650 mg  650 mg Oral Q4H PRN   ??? heparin (porcine) injection 5,000 Units  5,000 Units SubCUTAneous Q8H   ??? amLODIPine (NORVASC) tablet 5 mg  5 mg Oral DAILY   ??? atorvastatin (LIPITOR) tablet 20 mg  20 mg Oral QHS          Objective:     Patient Vitals for the past 8 hrs:   BP Temp Pulse Resp SpO2   07/23/17 0737 (!) 149/94 97.5 ??F (36.4 ??C) 80 18 95 %     No intake/output data recorded.  11/15 1901 - 11/17 0700   In: 320 [P.O.:300]  Out: 1430 [Urine:1400; Drains:30]    Physical Exam: Lungs: clear to auscultation bilaterally  Heart: regular rate and rhythm, S1, S2 normal, no murmur, click, rub or gallop  Abdomen: soft, non-tender. Bowel sounds normal. No masses,  no organomegaly        Data Review   Recent Results (from the past 24 hour(s))   GLUCOSE, POC    Collection Time: 07/22/17 11:43 AM   Result Value Ref Range    Glucose (POC) 235 (H) 65 - 100 mg/dL    Performed by Helyn AppPOWELL  PATRICIA    GLUCOSE, POC    Collection Time: 07/22/17  5:05 PM   Result Value Ref Range    Glucose (POC) 278 (H) 65 - 100 mg/dL    Performed by Janett Labellaaniesha Beaver    GLUCOSE, POC    Collection Time: 07/22/17  9:21 PM   Result Value Ref Range    Glucose (POC) 209 (H) 65 - 100 mg/dL    Performed by  Daniesha Beaver    GLUCOSE, POC    Collection Time: 07/23/17  6:50 AM   Result Value Ref Range    Glucose (POC) 191 (H) 65 - 100 mg/dL    Performed by [place]  Roxanne            Assessment:     Principal Problem:    Peritoneal abscess (HCC) (07/17/2017)    Active Problems:    Diabetes (HCC) (01/24/2013)      Hypertension complicating diabetes (HCC) (05/25/2016)      Morbid obesity due to excess calories (HCC) (05/25/2016)      Acute gastric ulcer with perforation (HCC) (05/13/2017)      Overview: S/p laporascopic repair and closure 05/12/2017        Plan:     1) Cont IV Rocephin  2) Cont to mobilize as tolerated  3) drain in

## 2017-07-23 NOTE — Progress Notes (Signed)
Progress Note    Patient: Dana SeatRebecca D Wessman MRN: [id number]  SSN: [id number]    Date of Birth: September 01, 1956  Age: 61 y.o.  Sex: female      Admit Date: 07/17/2017    Admitted with peritoneal abscess and is s/p drain placement     Subjective:     Patient has complaints of oain in left foot, but better.  Upset that now she has all these medical issues she never had before.  Gout in left foot is limiting mobility, but she is able to at least put the foot down today and bear a little weight.  No nausea or vomiting, No fever or chills, chest pain or shortness of breath.  Not used to being "the sick person" and is normally the caregiver and worker.    Bowels moving and voiding without difficulty     Objective:     Visit Vitals  BP (!) 149/94 (BP 1 Location: Right arm, BP Patient Position: At rest)   Pulse 80   Temp 97.5 ??F (36.4 ??C)   Resp 18   Ht 5\' 4"  (1.626 m)   Wt 233 lb 11 oz (106 kg)   SpO2 95%   BMI 40.11 kg/m??       Temp (24hrs), Avg:97.9 ??F (36.6 ??C), Min:97.4 ??F (36.3 ??C), Max:98.6 ??F (37 ??C)      Physical Exam:    A + O x 3, in bed and tearful, expressing frustration with current situation   Chest  CTA  COR  RRR  ABD Soft, obese, accordion drain right mid abdomen with moderate amount of purulent drainage in bag, tender as expected near drain, ND, +BS  EXT Left pedal edema; moving all extremities     Data Review: reviewed  Nursing documentation and I & O    Lab Review:   All lab results for the last 24 hours reviewed.    Assessment:     Hospital Problems  Date Reviewed: 07/13/2017          Codes Class Noted POA    * (Principal) Peritoneal abscess (HCC) ICD-10-CM: K65.1  ICD-9-CM: 567.22  07/17/2017 Yes        Acute gastric ulcer with perforation (HCC) ICD-10-CM: K25.1  ICD-9-CM: 531.10  05/13/2017 Yes    Overview Signed 07/17/2017  5:19 PM by Prudencio Pairitus, C Kent, MD     S/p laporascopic repair and closure 05/12/2017             Hypertension complicating diabetes (HCC) ICD-10-CM: E11.59, I10   ICD-9-CM: 250.80, 401.9  05/25/2016 Yes        Morbid obesity due to excess calories [facility](HCC) ICD-10-CM: E66.01  ICD-9-CM: 278.01  05/25/2016 Yes        Diabetes (HCC) ICD-10-CM: E11.9  ICD-9-CM: 250.00  01/24/2013 Yes              Plan/Recommendations/Medical Decision Making:     continue with drain and antibiotics    Up as tolerated / PT to see for mobilization and talked to her about at least getting to a recliner chair and she is agreeable   GI and DVT prophylaxis   Glycemic control   Hopefully home soon with drain   Dr. Lenn CalSuy covering and will follow     Signed By: Diamantina MonksJeannine M Emmett Bracknell, NP     July 23, 2017

## 2017-07-23 NOTE — Progress Notes (Signed)
Bedside and Verbal shift change report given to sarah, RN  (oncoming nurse) by Byrd HesselbachMaria (offgoing nurse). Report included the following information SBAR and Kardex.

## 2017-07-24 ENCOUNTER — Encounter

## 2017-07-24 LAB — METABOLIC PANEL, COMPREHENSIVE
A-G Ratio: 0.5 — ABNORMAL LOW (ref 1.1–2.2)
ALT (SGPT): 21 U/L (ref 12–78)
AST (SGOT): 14 U/L — ABNORMAL LOW (ref 15–37)
Albumin: 2.3 g/dL — ABNORMAL LOW (ref 3.5–5.0)
Alk. phosphatase: 60 U/L (ref 45–117)
Anion gap: 11 mmol/L (ref 5–15)
BUN/Creatinine ratio: 28 — ABNORMAL HIGH (ref 12–20)
BUN: 18 MG/DL (ref 6–20)
Bilirubin, total: 0.2 MG/DL (ref 0.2–1.0)
CO2: 27 mmol/L (ref 21–32)
Calcium: 8.7 MG/DL (ref 8.5–10.1)
Chloride: 100 mmol/L (ref 97–108)
Creatinine: 0.65 MG/DL (ref 0.55–1.02)
GFR est AA: >60 mL/min/{1.73_m2} (ref >60)
GFR est non-AA: >60 mL/min/{1.73_m2} (ref >60)
Globulin: 5.1 g/dL — ABNORMAL HIGH (ref 2.0–4.0)
Glucose: 167 mg/dL — ABNORMAL HIGH (ref 65–100)
Potassium: 2.9 mmol/L — ABNORMAL LOW (ref 3.5–5.1)
Protein, total: 7.4 g/dL (ref 6.4–8.2)
Sodium: 138 mmol/L (ref 136–145)

## 2017-07-24 LAB — CBC W/O DIFF
ABSOLUTE NRBC: 0 10*3/uL (ref 0.00–0.01)
HCT: 36.3 % (ref 35.0–47.0)
HGB: 11.7 g/dL (ref 11.5–16.0)
MCH: 28.7 PG (ref 26.0–34.0)
MCHC: 32.2 g/dL (ref 30.0–36.5)
MCV: 89.2 FL (ref 80.0–99.0)
MPV: 9.8 FL (ref 8.9–12.9)
NRBC: 0 PER 100 WBC
PLATELET: 336 10*3/uL (ref 150–400)
RBC: 4.07 M/uL (ref 3.80–5.20)
RDW: 14.2 % (ref 11.5–14.5)
WBC: 7.7 10*3/uL (ref 3.6–11.0)

## 2017-07-24 LAB — GLUCOSE, POC
Glucose (POC): 202 mg/dL — ABNORMAL HIGH (ref 65–100)
Glucose (POC): 181 mg/dL — ABNORMAL HIGH (ref 65–100)
Glucose (POC): 190 mg/dL — ABNORMAL HIGH (ref 65–100)
Glucose (POC): 171 mg/dL — ABNORMAL HIGH (ref 65–100)

## 2017-07-24 LAB — LIPASE: Lipase: 46 U/L — ABNORMAL LOW (ref 73–393)

## 2017-07-24 MED ORDER — POTASSIUM CHLORIDE 10 MEQ/100 ML IV PIGGY BACK
10 mEq/0 mL | INTRAVENOUS | Status: AC
Start: 2017-07-24 — End: 2017-07-24
  Administered 2017-07-24: 23:00:00 via INTRAVENOUS

## 2017-07-24 MED FILL — CEFTRIAXONE 2 GRAM SOLUTION FOR INJECTION: 2 gram | INTRAMUSCULAR | Qty: 2

## 2017-07-24 MED FILL — HYDROMORPHONE 2 MG/ML INJECTION SOLUTION: 2 mg/mL | INTRAMUSCULAR | Qty: 1

## 2017-07-24 MED FILL — COLCHICINE 0.6 MG TAB: 0.6 mg | ORAL | Qty: 1

## 2017-07-24 MED FILL — INSULIN LISPRO 100 UNIT/ML INJECTION: 100 unit/mL | SUBCUTANEOUS | Qty: 1

## 2017-07-24 MED FILL — PANTOPRAZOLE 40 MG TAB, DELAYED RELEASE: 40 mg | ORAL | Qty: 1

## 2017-07-24 MED FILL — NORMAL SALINE FLUSH 0.9 % INJECTION SYRINGE: INTRAMUSCULAR | Qty: 10

## 2017-07-24 MED FILL — SERTRALINE 50 MG TAB: 50 mg | ORAL | Qty: 2

## 2017-07-24 MED FILL — INSULIN NPH HUMAN RECOMB 100 UNIT/ML INJECTION: 100 unit/mL | SUBCUTANEOUS | Qty: 1

## 2017-07-24 MED FILL — LIPITOR 20 MG TABLET: 20 mg | ORAL | Qty: 1

## 2017-07-24 MED FILL — HEPARIN (PORCINE) 5,000 UNIT/ML IJ SOLN: 5000 unit/mL | INTRAMUSCULAR | Qty: 1

## 2017-07-24 MED FILL — SUCRALFATE 100 MG/ML ORAL SUSP: 100 mg/mL | ORAL | Qty: 10

## 2017-07-24 MED FILL — POTASSIUM CHLORIDE 10 MEQ/100 ML IV PIGGY BACK: 10 mEq/0 mL | INTRAVENOUS | Qty: 100

## 2017-07-24 MED FILL — TRIAMTERENE-HYDROCHLOROTHIAZIDE 37.5 MG-25 MG TAB: ORAL | Qty: 1

## 2017-07-24 MED FILL — AMLODIPINE 5 MG TAB: 5 mg | ORAL | Qty: 1

## 2017-07-24 NOTE — Progress Notes (Signed)
Drs. Nilda Calamity, Jeanie Sewer, Fanny Skates, and ChristensenAdmit Date: 07/17/2017      Subjective:     Patient with less L foot pain. Moving a little better. .       Current Facility-Administered Medications   Medication Dose Route Frequency   ??? insulin NPH (NOVOLIN N, HUMULIN N) injection 32 Units  32 Units SubCUTAneous QHS   ??? HYDROmorphone (DILAUDID) injection 1 mg  1 mg IntraVENous Q6H PRN   ??? cefTRIAXone (ROCEPHIN) 2 g in 0.9% sodium chloride (MBP/ADV) 50 mL  2 g IntraVENous Q24H   ??? HYDROcodone-acetaminophen (NORCO) 5-325 mg per tablet 1 Tab  1 Tab Oral Q4H PRN   ??? colchicine tablet 0.6 mg  0.6 mg Oral TID   ??? sodium chloride (NS) flush 5-10 mL  5-10 mL IntraVENous RAD PRN   ??? sucralfate (CARAFATE) 100 mg/mL oral suspension 1 g  1 g Oral AC&HS   ??? pantoprazole (PROTONIX) tablet 40 mg  40 mg Oral BID   ??? sertraline (ZOLOFT) tablet 100 mg  100 mg Oral DAILY   ??? traMADol (ULTRAM) tablet 50 mg  50 mg Oral Q8H PRN   ??? triamterene-hydroCHLOROthiazide (MAXZIDE) 37.5-25 mg per tablet 1 Tab  1 Tab Oral DAILY   ??? insulin lispro (HUMALOG) injection   SubCUTAneous TIDAC   ??? glucose chewable tablet 16 g  4 Tab Oral PRN   ??? dextrose (D50W) injection syrg 12.5-25 g  12.5-25 g IntraVENous PRN   ??? glucagon (GLUCAGEN) injection 1 mg  1 mg IntraMUSCular PRN   ??? zolpidem (AMBIEN) tablet 5 mg  5 mg Oral QHS PRN   ??? sodium chloride (NS) flush 5-10 mL  5-10 mL IntraVENous Q8H   ??? sodium chloride (NS) flush 5-10 mL  5-10 mL IntraVENous PRN   ??? acetaminophen (TYLENOL) tablet 650 mg  650 mg Oral Q4H PRN   ??? heparin (porcine) injection 5,000 Units  5,000 Units SubCUTAneous Q8H   ??? amLODIPine (NORVASC) tablet 5 mg  5 mg Oral DAILY   ??? atorvastatin (LIPITOR) tablet 20 mg  20 mg Oral QHS          Objective:     Patient Vitals for the past 8 hrs:   BP Temp Pulse Resp SpO2   07/24/17 0717 (!) 148/98 97.6 ??F (36.4 ??C) 81 18 93 %     11/18 0701 - 11/18 1900  In: -   Out: 20 [Drains:20]  11/16 1901 - 11/18 0700  In: 61 [P.O.:650]   Out: 2170 [Urine:2100; Drains:70]    Physical Exam: Lungs: clear to auscultation bilaterally  Heart: regular rate and rhythm, S1, S2 normal, no murmur, click, rub or gallop  Abdomen: soft, non-tender. Bowel sounds normal. No masses,  no organomegaly        Data Review   Recent Results (from the past 24 hour(s))   GLUCOSE, POC    Collection Time: 07/23/17 11:19 AM   Result Value Ref Range    Glucose (POC) 197 (H) 65 - 100 mg/dL    Performed by Hassell Done CHELSEA(CON)    GLUCOSE, POC    Collection Time: 07/23/17  4:54 PM   Result Value Ref Range    Glucose (POC) 206 (H) 65 - 100 mg/dL    Performed by [place], POC    Collection Time: 07/23/17  9:06 PM   Result Value Ref Range    Glucose (POC) 202 (H) 65 - 100 mg/dL    Performed by Fuller [place]  CBC W/O DIFF    Collection Time: 07/24/17  4:17 AM   Result Value Ref Range    WBC 7.7 3.6 - 11.0 K/uL    RBC 4.07 3.80 - 5.20 M/uL    HGB 11.7 11.5 - 16.0 g/dL    HCT 36.3 35.0 - 47.0 %    MCV 89.2 80.0 - 99.0 FL    MCH 28.7 26.0 - 34.0 PG    MCHC 32.2 30.0 - 36.5 g/dL    RDW 14.2 11.5 - 14.5 %    PLATELET 336 150 - 400 K/uL    MPV 9.8 8.9 - 12.9 FL    NRBC 0.0 0 PER 100 WBC    ABSOLUTE NRBC 0.00 0.00 - 0.86 K/uL   METABOLIC PANEL, COMPREHENSIVE    Collection Time: 07/24/17  4:17 AM   Result Value Ref Range    Sodium 138 136 - 145 mmol/L    Potassium 2.9 (L) 3.5 - 5.1 mmol/L    Chloride 100 97 - 108 mmol/L    CO2 27 21 - 32 mmol/L    Anion gap 11 5 - 15 mmol/L    Glucose 167 (H) 65 - 100 mg/dL    BUN 18 6 - 20 MG/DL    Creatinine 0.65 0.55 - 1.02 MG/DL    BUN/Creatinine ratio 28 (H) 12 - 20      GFR est AA >60 >60 ml/min/1.85m    GFR est non-AA >60 >60 ml/min/1.756m   Calcium 8.7 8.5 - 10.1 MG/DL    Bilirubin, total 0.2 0.2 - 1.0 MG/DL    ALT (SGPT) 21 12 - 78 U/L    AST (SGOT) 14 (L) 15 - 37 U/L    Alk. phosphatase 60 45 - 117 U/L    Protein, total 7.4 6.4 - 8.2 g/dL    Albumin 2.3 (L) 3.5 - 5.0 g/dL    Globulin 5.1 (H) 2.0 - 4.0 g/dL     A-G Ratio 0.5 (L) 1.1 - 2.2     LIPASE    Collection Time: 07/24/17  4:17 AM   Result Value Ref Range    Lipase 46 (L) 73 - 393 U/L   GLUCOSE, POC    Collection Time: 07/24/17  6:04 AM   Result Value Ref Range    Glucose (POC) 181 (H) 65 - 100 mg/dL    Performed by FiIlsa Iha          Assessment:     Principal Problem:    Peritoneal abscess ([place](07/17/2017)    Active Problems:    Diabetes ([facility](01/24/2013)      Hypertension complicating diabetes ([place](05/25/2016)      Morbid obesity due to excess calories ([facility](05/25/2016)      Acute gastric ulcer with perforation ([facility](05/13/2017)      Overview: S/p laporascopic repair and closure 05/12/2017        Plan:     1) Cont IV Abx  2) Increase mobility as tol

## 2017-07-24 NOTE — Progress Notes (Signed)
Bedside and Verbal shift change report given to Shelley, RN (oncoming nurse) by Markas Aldredge, RN (offgoing nurse). Report included the following information SBAR, Kardex, ED Summary, Intake/Output, MAR and Recent Results.

## 2017-07-24 NOTE — Progress Notes (Signed)
Progress Note    Patient: Dana [name] MRN: [id number]  SSN: [id number]    Date of Birth: [birth year]  Age: 61 y.o.  Sex: female      Admit Date: 07/17/2017    Gastric perforation    Subjective:     No acute surgical issues.  Pt doing well.  Foot pain is better.  Tolerating diet.  No nausea or vomiting.    Objective:     Visit Vitals  BP 131/85 (BP 1 Location: Right arm, BP Patient Position: At rest)   Pulse 94   Temp 98.3 ??F (36.8 ??C)   Resp 18   Ht 5' 4"  (1.626 m)   Wt 233 lb 11 oz (106 kg)   SpO2 94%   BMI 40.11 kg/m??       Temp (24hrs), Avg:97.8 ??F (36.6 ??C), Min:97.5 ??F (36.4 ??C), Max:98.3 ??F (36.8 ??C)        Physical Exam:    Gen:  NAD  Pulm:  Unlabored  Abd:  S/moderate TTP in epigastric and supraumbilical area/non-distended  Wound:  C/D/I    Recent Results (from the past 24 hour(s))   GLUCOSE, POC    Collection Time: 07/23/17  9:06 PM   Result Value Ref Range    Glucose (POC) 202 (H) 65 - 100 mg/dL    Performed by Fuller [place]    CBC W/O DIFF    Collection Time: 07/24/17  4:17 AM   Result Value Ref Range    WBC 7.7 3.6 - 11.0 K/uL    RBC 4.07 3.80 - 5.20 M/uL    HGB 11.7 11.5 - 16.0 g/dL    HCT 36.3 35.0 - 47.0 %    MCV 89.2 80.0 - 99.0 FL    MCH 28.7 26.0 - 34.0 PG    MCHC 32.2 30.0 - 36.5 g/dL    RDW 14.2 11.5 - 14.5 %    PLATELET 336 150 - 400 K/uL    MPV 9.8 8.9 - 12.9 FL    NRBC 0.0 0 PER 100 WBC    ABSOLUTE NRBC 0.00 0.00 - 6.23 K/uL   METABOLIC PANEL, COMPREHENSIVE    Collection Time: 07/24/17  4:17 AM   Result Value Ref Range    Sodium 138 136 - 145 mmol/L    Potassium 2.9 (L) 3.5 - 5.1 mmol/L    Chloride 100 97 - 108 mmol/L    CO2 27 21 - 32 mmol/L    Anion gap 11 5 - 15 mmol/L    Glucose 167 (H) 65 - 100 mg/dL    BUN 18 6 - 20 MG/DL    Creatinine 0.65 0.55 - 1.02 MG/DL    BUN/Creatinine ratio 28 (H) 12 - 20      GFR est AA >60 >60 ml/min/1.32m    GFR est non-AA >60 >60 ml/min/1.742m   Calcium 8.7 8.5 - 10.1 MG/DL    Bilirubin, total 0.2 0.2 - 1.0 MG/DL    ALT (SGPT) 21 12 - 78 U/L     AST (SGOT) 14 (L) 15 - 37 U/L    Alk. phosphatase 60 45 - 117 U/L    Protein, total 7.4 6.4 - 8.2 g/dL    Albumin 2.3 (L) 3.5 - 5.0 g/dL    Globulin 5.1 (H) 2.0 - 4.0 g/dL    A-G Ratio 0.5 (L) 1.1 - 2.2     LIPASE    Collection Time: 07/24/17  4:17 AM   Result Value Ref Range  Lipase 46 (L) 73 - 393 U/L   GLUCOSE, POC    Collection Time: 07/24/17  6:04 AM   Result Value Ref Range    Glucose (POC) 181 (H) 65 - 100 mg/dL    Performed by [place], POC    Collection Time: 07/24/17 11:25 AM   Result Value Ref Range    Glucose (POC) 190 (H) 65 - 100 mg/dL    Performed by [place], POC    Collection Time: 07/24/17  4:42 PM   Result Value Ref Range    Glucose (POC) 171 (H) 65 - 100 mg/dL    Performed by Michell Heinrich          Assessment:     Hospital Problems  Date Reviewed: 08-10-17          Codes Class Noted POA    * (Principal) Peritoneal abscess ([place]) ICD-10-CM: K65.1  ICD-9-CM: 567.22  07/17/2017 Yes        Acute gastric ulcer with perforation ([place]) ICD-10-CM: K25.1  ICD-9-CM: 531.10  05/13/2017 Yes    Overview Signed 07/17/2017  5:19 PM by Kandis Nab, MD     S/p laporascopic repair and closure 05/12/2017             Hypertension complicating diabetes ([place]) ICD-10-CM: E11.59, I10  ICD-9-CM: 250.80, 401.9  05/25/2016 Yes        Morbid obesity due to excess calories ([place]) ICD-10-CM: E66.01  ICD-9-CM: 278.01  05/25/2016 Yes        Diabetes ([place]) ICD-10-CM: E11.9  ICD-9-CM: 250.00  01/24/2013 Yes              Plan/Recommendations/Medical Decision Making:     - Continue antibiotic  - Monitor drain output  - Repeat CT scan tomorrow to evaluate for resolution of abscess    Signed By: Carlis Abbott, MD     July 24, 2017

## 2017-07-25 ENCOUNTER — Inpatient Hospital Stay: Admit: 2017-07-25 | Payer: BLUE CROSS/BLUE SHIELD | Primary: Internal Medicine

## 2017-07-25 ENCOUNTER — Inpatient Hospital Stay: Payer: BLUE CROSS/BLUE SHIELD | Primary: Internal Medicine

## 2017-07-25 ENCOUNTER — Inpatient Hospital Stay

## 2017-07-25 LAB — METABOLIC PANEL, BASIC
Anion gap: 12 mmol/L (ref 5–15)
BUN/Creatinine ratio: 23 — ABNORMAL HIGH (ref 12–20)
BUN: 15 MG/DL (ref 6–20)
CO2: 23 mmol/L (ref 21–32)
Calcium: 8.3 MG/DL — ABNORMAL LOW (ref 8.5–10.1)
Chloride: 101 mmol/L (ref 97–108)
Creatinine: 0.66 MG/DL (ref 0.55–1.02)
GFR est AA: >60 mL/min/{1.73_m2} (ref >60)
GFR est non-AA: >60 mL/min/{1.73_m2} (ref >60)
Glucose: 151 mg/dL — ABNORMAL HIGH (ref 65–100)
Potassium: 3.2 mmol/L — ABNORMAL LOW (ref 3.5–5.1)
Sodium: 136 mmol/L (ref 136–145)

## 2017-07-25 LAB — GLUCOSE, POC
Glucose (POC): 227 mg/dL — ABNORMAL HIGH (ref 65–100)
Glucose (POC): 161 mg/dL — ABNORMAL HIGH (ref 65–100)
Glucose (POC): 146 mg/dL — ABNORMAL HIGH (ref 65–100)
Glucose (POC): 145 mg/dL — ABNORMAL HIGH (ref 65–100)

## 2017-07-25 LAB — MAGNESIUM: Magnesium: 1.5 mg/dL — ABNORMAL LOW (ref 1.6–2.4)

## 2017-07-25 MED ORDER — IOPAMIDOL 76 % IV SOLN
370 mg iodine /mL (76 %) | Freq: Once | INTRAVENOUS | Status: AC
Start: 2017-07-25 — End: 2017-07-25
  Administered 2017-07-25: 19:00:00 via INTRAVENOUS

## 2017-07-25 MED ORDER — POTASSIUM CHLORIDE SR 10 MEQ TAB
10 mEq | Freq: Every day | ORAL | Status: DC
Start: 2017-07-25 — End: 2017-07-26
  Administered 2017-07-26: 15:00:00 via ORAL

## 2017-07-25 MED ORDER — IOHEXOL 240 MG/ML IV SOLN
240 mg iodine/mL | Freq: Once | INTRAVENOUS | Status: AC
Start: 2017-07-25 — End: 2017-07-25

## 2017-07-25 MED ORDER — SODIUM CHLORIDE 0.9 % IJ SYRG
Freq: Once | INTRAMUSCULAR | Status: AC
Start: 2017-07-25 — End: 2017-07-25
  Administered 2017-07-25: 19:00:00 via INTRAVENOUS

## 2017-07-25 MED ORDER — SODIUM CHLORIDE 0.9% BOLUS IV
0.9 % | Freq: Once | INTRAVENOUS | Status: AC
Start: 2017-07-25 — End: 2017-07-26
  Administered 2017-07-25: 19:00:00 via INTRAVENOUS

## 2017-07-25 MED ORDER — POTASSIUM CHLORIDE SR 10 MEQ TAB
10 mEq | Freq: Two times a day (BID) | ORAL | Status: DC
Start: 2017-07-25 — End: 2017-07-26
  Administered 2017-07-25 – 2017-07-26 (×4): via ORAL

## 2017-07-25 MED FILL — SUCRALFATE 100 MG/ML ORAL SUSP: 100 mg/mL | ORAL | Qty: 10

## 2017-07-25 MED FILL — TRIAMTERENE-HYDROCHLOROTHIAZIDE 37.5 MG-25 MG TAB: ORAL | Qty: 1

## 2017-07-25 MED FILL — NORMAL SALINE FLUSH 0.9 % INJECTION SYRINGE: INTRAMUSCULAR | Qty: 10

## 2017-07-25 MED FILL — OMNIPAQUE 240 MG IODINE/ML INTRAVENOUS SOLUTION: 240 mg iodine/mL | INTRAVENOUS | Qty: 50

## 2017-07-25 MED FILL — PANTOPRAZOLE 40 MG TAB, DELAYED RELEASE: 40 mg | ORAL | Qty: 1

## 2017-07-25 MED FILL — COLCHICINE 0.6 MG TAB: 0.6 mg | ORAL | Qty: 1

## 2017-07-25 MED FILL — INSULIN LISPRO 100 UNIT/ML INJECTION: 100 unit/mL | SUBCUTANEOUS | Qty: 1

## 2017-07-25 MED FILL — CEFTRIAXONE 2 GRAM SOLUTION FOR INJECTION: 2 gram | INTRAMUSCULAR | Qty: 2

## 2017-07-25 MED FILL — K-TAB 10 MEQ TABLET,EXTENDED RELEASE: 10 mEq | ORAL | Qty: 4

## 2017-07-25 MED FILL — HYDROCODONE-ACETAMINOPHEN 5 MG-325 MG TAB: 5-325 mg | ORAL | Qty: 1

## 2017-07-25 MED FILL — HEPARIN (PORCINE) 5,000 UNIT/ML IJ SOLN: 5000 unit/mL | INTRAMUSCULAR | Qty: 1

## 2017-07-25 MED FILL — ISOVUE-370  76 % INTRAVENOUS SOLUTION: 370 mg iodine /mL (76 %) | INTRAVENOUS | Qty: 100

## 2017-07-25 MED FILL — INSULIN NPH HUMAN RECOMB 100 UNIT/ML INJECTION: 100 unit/mL | SUBCUTANEOUS | Qty: 1

## 2017-07-25 MED FILL — HYDROMORPHONE 2 MG/ML INJECTION SOLUTION: 2 mg/mL | INTRAMUSCULAR | Qty: 1

## 2017-07-25 MED FILL — LIPITOR 20 MG TABLET: 20 mg | ORAL | Qty: 1

## 2017-07-25 MED FILL — ZOLPIDEM 5 MG TAB: 5 mg | ORAL | Qty: 1

## 2017-07-25 MED FILL — SODIUM CHLORIDE 0.9 % IV: INTRAVENOUS | Qty: 100

## 2017-07-25 MED FILL — AMLODIPINE 5 MG TAB: 5 mg | ORAL | Qty: 1

## 2017-07-25 MED FILL — SERTRALINE 50 MG TAB: 50 mg | ORAL | Qty: 2

## 2017-07-25 NOTE — Other (Signed)
DTC Progress Note    Recommendations/ Comments: Chart review for hyperglycemia.     If appropriate please consider   Addition of Glipizide 5 mg daily    DTC to continue to follow.     Current hospital DM medication:   Lispro resistant correction scale  NPH 32 units hs   Pt also on Prednisone 5 mg     Chart reviewed on [name].    Patient is a 61 y.o. female with known DM on NPH 32 units at night and lispro sliding scale AC & HS at home.    A1c:   Lab Results   Component Value Date/Time    Hemoglobin A1c 7.3 (H) 05/13/2017 03:04 AM    Hemoglobin A1c 7.2 (H) 01/31/2015 03:52 PM       Recent Glucose Results:   Lab Results   Component Value Date/Time    GLU 151 (H) 07/25/2017 04:16 AM    GLUCPOC 146 (H) 07/25/2017 11:35 AM    GLUCPOC 161 (H) 07/25/2017 06:37 AM    GLUCPOC 227 (H) 07/24/2017 10:06 PM        Lab Results   Component Value Date/Time    Creatinine 0.66 07/25/2017 04:16 AM     Estimated Creatinine Clearance: 109 mL/min (based on SCr of 0.66 mg/dL).    Active Orders   Diet    DIET DIABETIC CONSISTENT CARB Regular        PO intake:   Patient Vitals for the past 72 hrs:   % Diet Eaten   07/25/17 1021 0 %   07/23/17 1618 100 %       Will continue to follow as needed.    Thank you  Burlene ArntNeysa Serra-Valentin, RD, CDE  [facility]  Pager: [phone]

## 2017-07-25 NOTE — Progress Notes (Signed)
Medical Progress Note      NAME: Dana [name]   DOB:  [birth year]  MRM:  [id number]    Date/Time: 07/25/2017  7:54 AM    Problem List:   Principal Problem:    Peritoneal abscess (HCC) (07/17/2017)    Active Problems:    Diabetes (HCC) (01/24/2013)      Hypertension complicating diabetes (HCC) (05/25/2016)      Morbid obesity due to excess calories (HCC) (05/25/2016)      Acute gastric ulcer with perforation (HCC) (05/13/2017)      Overview: S/p laporascopic repair and closure 05/12/2017           Subjective:     Patient c/o increased pain in abdomen.  Foot is much better, she can bear weight.      Past Medical History:   Diagnosis Date   ??? Diabetes (HCC) 01/24/2013   ??? GERD (gastroesophageal reflux disease)    ??? Hives    ??? HTN (hypertension)    ??? Hypercholesterolemia    ??? Perforated gastric ulcer (HCC)        ROS:  General: negative for fever, chills, sweats, weakness  Respiratory:  negative for cough, sputum production, SOB, wheezing, DOE, pleuritic pain  Cardiology:  negative for chest pain, palpitations, orthopnea, PND, edema, syncope   Gastrointestinal: positive for abd pain         Objective:       Vitals:          Last 24hrs VS reviewed since prior progress note. Most recent are:    Visit Vitals  BP 135/80 (BP 1 Location: Left arm, BP Patient Position: At rest)   Pulse 84   Temp 98.1 ??F (36.7 ??C)   Resp 16   Ht 5\' 4"  (1.626 m)   Wt 233 lb 11 oz (106 kg)   SpO2 95%   BMI 40.11 kg/m??     SpO2 Readings from Last 6 Encounters:   07/24/17 95%   07/14/17 96%   06/09/17 98%   05/23/17 98%   05/18/17 93%   05/20/15 94%    O2 Flow Rate (L/min): 2 l/min       Intake/Output Summary (Last 24 hours) at 07/25/2017 0754  Last data filed at 07/25/2017 4098  Gross per 24 hour   Intake 30 ml   Output 530 ml   Net -500 ml          Exam:     General   Obese 61 yo wf  Respiratory   Clear To Auscultation bilaterally - no wheezes, rales, rhonchi, or crackles  Cardiology  regular  Abdominal  Soft, generalized tenderness. Dec bs   Extremities  No edema. Left foot less tender    Lab Data Reviewed: (see below)    Medications Reviewed: (see below)    ______________________________________________________________________    Medications:     Current Facility-Administered Medications   Medication Dose Route Frequency   ??? sodium chloride 0.9 % bolus infusion 100 mL  100 mL IntraVENous RAD ONCE   ??? iopamidol (ISOVUE-370) 76 % injection 100 mL  100 mL IntraVENous RAD ONCE   ??? sodium chloride (NS) flush 10 mL  10 mL IntraVENous RAD ONCE   ??? iohexol (OMNIPAQUE) 240 mg iodine/mL solution 50 mL  50 mL Oral RAD ONCE   ??? potassium chloride SR (KLOR-CON 10) tablet 40 mEq  40 mEq Oral BID   ??? insulin NPH (NOVOLIN N, HUMULIN N) injection 32 Units  32 Units SubCUTAneous QHS   ???  HYDROmorphone (DILAUDID) injection 1 mg  1 mg IntraVENous Q6H PRN   ??? cefTRIAXone (ROCEPHIN) 2 g in 0.9% sodium chloride (MBP/ADV) 50 mL  2 g IntraVENous Q24H   ??? HYDROcodone-acetaminophen (NORCO) 5-325 mg per tablet 1 Tab  1 Tab Oral Q4H PRN   ??? colchicine tablet 0.6 mg  0.6 mg Oral TID   ??? sodium chloride (NS) flush 5-10 mL  5-10 mL IntraVENous RAD PRN   ??? sucralfate (CARAFATE) 100 mg/mL oral suspension 1 g  1 g Oral AC&HS   ??? pantoprazole (PROTONIX) tablet 40 mg  40 mg Oral BID   ??? sertraline (ZOLOFT) tablet 100 mg  100 mg Oral DAILY   ??? traMADol (ULTRAM) tablet 50 mg  50 mg Oral Q8H PRN   ??? triamterene-hydroCHLOROthiazide (MAXZIDE) 37.5-25 mg per tablet 1 Tab  1 Tab Oral DAILY   ??? insulin lispro (HUMALOG) injection   SubCUTAneous TIDAC   ??? glucose chewable tablet 16 g  4 Tab Oral PRN   ??? dextrose (D50W) injection syrg 12.5-25 g  12.5-25 g IntraVENous PRN   ??? glucagon (GLUCAGEN) injection 1 mg  1 mg IntraMUSCular PRN   ??? zolpidem (AMBIEN) tablet 5 mg  5 mg Oral QHS PRN   ??? sodium chloride (NS) flush 5-10 mL  5-10 mL IntraVENous Q8H   ??? sodium chloride (NS) flush 5-10 mL  5-10 mL IntraVENous PRN   ??? acetaminophen (TYLENOL) tablet 650 mg  650 mg Oral Q4H PRN    ??? heparin (porcine) injection 5,000 Units  5,000 Units SubCUTAneous Q8H   ??? amLODIPine (NORVASC) tablet 5 mg  5 mg Oral DAILY   ??? atorvastatin (LIPITOR) tablet 20 mg  20 mg Oral QHS            Lab Review:     Recent Labs     07/24/17  0417   WBC 7.7   HGB 11.7   HCT 36.3   PLT 336     Recent Labs     07/25/17  0416 07/24/17  0417 07/23/17  0943   NA 136 138 135*   K 3.2* 2.9* 2.8*   CL 101 100 99   CO2 23 27 27    GLU 151* 167* 184*   BUN 15 18 16    CREA 0.66 0.65 0.58   CA 8.3* 8.7 9.2   MG 1.5*  --   --    ALB  --  2.3*  --    TBILI  --  0.2  --    SGOT  --  14*  --    ALT  --  21  --      Lab Results   Component Value Date/Time    Glucose (POC) 161 (H) 07/25/2017 06:37 AM    Glucose (POC) 227 (H) 07/24/2017 10:06 PM    Glucose (POC) 171 (H) 07/24/2017 04:42 PM    Glucose (POC) 190 (H) 07/24/2017 11:25 AM    Glucose (POC) 181 (H) 07/24/2017 06:04 AM     No results for input(s): PH, PCO2, PO2, HCO3, FIO2 in the last 72 hours.  No results for input(s): INR in the last 72 hours.    No lab exists for component: INREXT    Other pertinent lab: NA         Assessment:     Patient Active Problem List   Diagnosis Code   ??? Hypercholesterolemia E78.00   ??? Diabetes (HCC) E11.9   ??? Sleep apnea G47.30   ??? Hypertension  complicating diabetes (HCC) E11.59, I10   ??? Morbid obesity due to excess calories (HCC) E66.01   ??? Hives L50.9   ??? Acute gastric ulcer with perforation (HCC) K25.1   ??? Arthralgia of both knees M25.561, M25.562   ??? Abdominal pain R10.9   ??? Peritoneal abscess (HCC) K65.1          Plan:                 1. Peritoneal abscess- on rocephin.  Reck ct due to increased pain  2. Diabetes= blood sugars better controlled  3. Gout- controlled.  4. Hypertension- monitor                ___________________________________________________    Attending Physician: Lake BellsNancy J Jewelianna Pancoast, MD

## 2017-07-25 NOTE — Progress Notes (Signed)
Physical Therapy:    Attempted PT session. Pt currently drinking CT contrast in prep for test this morning and requested therapy to come back later. Will defer and continue to follow. Thank you    Hamilton CapriAshley Paynter, PT, DPT

## 2017-07-25 NOTE — Progress Notes (Signed)
Patient is not ready for discharge today.. CM [facility] message via allscripts to [facility]  [phone] informing the agency that patient is not being discharged today.      Patient lives at home with her significant  Other, Cristie HemDiana Hoover [phone]  and works full time for [employer] on [address]  [facility] and work when medically able

## 2017-07-25 NOTE — Progress Notes (Signed)
Bedside and Verbal shift change report given to latoya, RN  (oncoming nurse) by maria (offgoing nurse). Report included the following information SBAR and Kardex.

## 2017-07-25 NOTE — Other (Signed)
Patient with severe pain this am, resolved after norco. Minimal leakage from drain site. Drain output equal to drain saline flush inputs.    Bedside shift change report given to Herbert SetaHeather RN (oncoming nurse) by Martie LeeSabrina RN (offgoing nurse). Report included the following information SBAR, Kardex, Intake/Output, MAR and Recent Results.

## 2017-07-25 NOTE — Progress Notes (Signed)
Ms. Nedra HaiLee tells me that her abdominal pain has improved. Tolerating diet.  Tm 98.3 HR: 86 BP: 133/76 Resp Rate: 18 94% sat on room air.  Drain output - 50ml for 24 hours.    Intake/Output Summary (Last 24 hours) at 07/25/2017 0847  Last data filed at 07/25/2017 0412  Gross per 24 hour   Intake 30 ml   Output 530 ml   Net -500 ml   Exam: Cor: RRR.              Lungs: Bilateral breath sounds. Clear to auscultation.               Abd: Soft. Epigastric tenderness.            No guarding or rebound.            No distension.            Purulent material in drain.  Labs:   Recent Results (from the past 12 hour(s))   GLUCOSE, POC    Collection Time: 07/24/17 10:06 PM   Result Value Ref Range    Glucose (POC) 227 (H) 65 - 100 mg/dL    Performed by ZEDD CROW DANA(CON)    METABOLIC PANEL, BASIC    Collection Time: 07/25/17  4:16 AM   Result Value Ref Range    Sodium 136 136 - 145 mmol/L    Potassium 3.2 (L) 3.5 - 5.1 mmol/L    Chloride 101 97 - 108 mmol/L    CO2 23 21 - 32 mmol/L    Anion gap 12 5 - 15 mmol/L    Glucose 151 (H) 65 - 100 mg/dL    BUN 15 6 - 20 MG/DL    Creatinine 1.610.66 0.960.55 - 1.02 MG/DL    BUN/Creatinine ratio 23 (H) 12 - 20      GFR est AA >60 >60 ml/min/1.7673m2    GFR est non-AA >60 >60 ml/min/1.5473m2    Calcium 8.3 (L) 8.5 - 10.1 MG/DL   MAGNESIUM    Collection Time: 07/25/17  4:16 AM   Result Value Ref Range    Magnesium 1.5 (L) 1.6 - 2.4 mg/dL   GLUCOSE, POC    Collection Time: 07/25/17  6:37 AM   Result Value Ref Range    Glucose (POC) 161 (H) 65 - 100 mg/dL    Performed by Alcario DroughtFishburn Sabrina    Diet as tolerated.   CT scan abdomen/pelvis today to reassess intra-abdominal abscess.   Continue physical therapy.  Pain medication and anti-emetics as ordered.   DVT prophylaxis - SC Heparin.   Protonix 40mg  BID and carafate as ordered.   Continue IV abx - Rocephin.  Discharge planning in progress.   Plans per Dr. Cherly HensenPahle.

## 2017-07-25 NOTE — Progress Notes (Addendum)
2215 Patient is an extremely hard stick. Picc team tried several times and was unsuccessful. Telephoned MD on call and Dr. Kinnie Scalesitus changed IV Dilaudid to PO or IM q6hrs. He stated that he will try and order picc line to place tomorrow.     0600 Patient slept all night without any complaints voiced to this nurse. Will continue to monitor

## 2017-07-26 LAB — GLUCOSE, POC
Glucose (POC): 146 mg/dL — ABNORMAL HIGH (ref 65–100)
Glucose (POC): 170 mg/dL — ABNORMAL HIGH (ref 65–100)
Glucose (POC): 227 mg/dL — ABNORMAL HIGH (ref 65–100)
Glucose (POC): 156 mg/dL — ABNORMAL HIGH (ref 65–100)

## 2017-07-26 MED ORDER — TRIMETHOPRIM-SULFAMETHOXAZOLE 160 MG-800 MG TAB
160-800 mg | Freq: Two times a day (BID) | ORAL | Status: DC
Start: 2017-07-26 — End: 2017-07-26
  Administered 2017-07-26: 15:00:00 via ORAL

## 2017-07-26 MED ORDER — CARAFATE 100 MG/ML ORAL SUSPENSION
100 mg/mL | ORAL | 0 refills | Status: DC
Start: 2017-07-26 — End: 2018-03-23

## 2017-07-26 MED ORDER — HYDROMORPHONE 2 MG/ML INJECTION SOLUTION
2 mg/mL | Freq: Four times a day (QID) | INTRAMUSCULAR | Status: DC | PRN
Start: 2017-07-26 — End: 2017-07-26
  Administered 2017-07-26: 04:00:00 via INTRAMUSCULAR

## 2017-07-26 MED ORDER — AMLODIPINE 5 MG TAB
5 mg | ORAL_TABLET | Freq: Every day | ORAL | 0 refills | Status: DC
Start: 2017-07-26 — End: 2017-08-31

## 2017-07-26 MED ORDER — HYDROMORPHONE 2 MG TAB
2 mg | ORAL_TABLET | ORAL | 0 refills | Status: DC | PRN
Start: 2017-07-26 — End: 2017-08-11

## 2017-07-26 MED ORDER — TRIMETHOPRIM-SULFAMETHOXAZOLE 160 MG-800 MG TAB
160-800 mg | ORAL_TABLET | Freq: Two times a day (BID) | ORAL | 0 refills | Status: DC
Start: 2017-07-26 — End: 2017-08-11

## 2017-07-26 MED ORDER — COLCHICINE 0.6 MG TAB
0.6 mg | ORAL_TABLET | Freq: Three times a day (TID) | ORAL | 0 refills | Status: DC
Start: 2017-07-26 — End: 2017-08-11

## 2017-07-26 MED ORDER — HYDROMORPHONE 2 MG TAB
2 mg | Freq: Four times a day (QID) | ORAL | Status: DC | PRN
Start: 2017-07-26 — End: 2017-07-26

## 2017-07-26 MED FILL — HEPARIN (PORCINE) 5,000 UNIT/ML IJ SOLN: 5000 unit/mL | INTRAMUSCULAR | Qty: 1

## 2017-07-26 MED FILL — SUCRALFATE 100 MG/ML ORAL SUSP: 100 mg/mL | ORAL | Qty: 10

## 2017-07-26 MED FILL — COLCHICINE 0.6 MG TAB: 0.6 mg | ORAL | Qty: 1

## 2017-07-26 MED FILL — PANTOPRAZOLE 40 MG TAB, DELAYED RELEASE: 40 mg | ORAL | Qty: 1

## 2017-07-26 MED FILL — K-TAB 10 MEQ TABLET,EXTENDED RELEASE: 10 mEq | ORAL | Qty: 4

## 2017-07-26 MED FILL — NORMAL SALINE FLUSH 0.9 % INJECTION SYRINGE: INTRAMUSCULAR | Qty: 10

## 2017-07-26 MED FILL — ZOLPIDEM 5 MG TAB: 5 mg | ORAL | Qty: 1

## 2017-07-26 MED FILL — INSULIN LISPRO 100 UNIT/ML INJECTION: 100 unit/mL | SUBCUTANEOUS | Qty: 1

## 2017-07-26 MED FILL — TRIAMTERENE-HYDROCHLOROTHIAZIDE 37.5 MG-25 MG TAB: ORAL | Qty: 1

## 2017-07-26 MED FILL — INSULIN NPH HUMAN RECOMB 100 UNIT/ML INJECTION: 100 unit/mL | SUBCUTANEOUS | Qty: 1

## 2017-07-26 MED FILL — HYDROMORPHONE 2 MG/ML INJECTION SOLUTION: 2 mg/mL | INTRAMUSCULAR | Qty: 1

## 2017-07-26 MED FILL — AMLODIPINE 5 MG TAB: 5 mg | ORAL | Qty: 1

## 2017-07-26 MED FILL — SERTRALINE 50 MG TAB: 50 mg | ORAL | Qty: 2

## 2017-07-26 MED FILL — LIPITOR 20 MG TABLET: 20 mg | ORAL | Qty: 1

## 2017-07-26 MED FILL — NORMAL SALINE FLUSH 0.9 % INJECTION SYRINGE: INTRAMUSCULAR | Qty: 20

## 2017-07-26 MED FILL — K-TAB 10 MEQ TABLET,EXTENDED RELEASE: 10 mEq | ORAL | Qty: 1

## 2017-07-26 MED FILL — TRIMETHOPRIM-SULFAMETHOXAZOLE 160 MG-800 MG TAB: 160-800 mg | ORAL | Qty: 1

## 2017-07-26 NOTE — Progress Notes (Signed)
I went over the discharge instructions and the patient understood them. I provided the patient with a copy of their discharge instructions and written prescription.

## 2017-07-26 NOTE — Progress Notes (Signed)
07/27/17, 9:55AM - POST DISCHARGE NOTE.      [facility], Selena BattenKim.  They are not able to accept this patient into care due to staffing issues.  Kim noted that the patient was discharged late last evening.      Call[facility] and [facility] with [facility], [place] Ridge[place]ell.  They still have home health orders from last week that are active [facility]  They will reach out to the patient to start care.  No other discharge needs identified at this time.   Levan HurstEmily R Phillips, MSW

## 2017-07-26 NOTE — Progress Notes (Signed)
General Surgery Daily Progress Note  Admit Date: 07/17/2017  Post-Operative Day: * No surgery found * from * No surgery found *     Subjective:     Last 24 hrs: Doing ok today. Pain is being managed. Not thrilled that she will have to go home with a drain. No nausea or vomiting. Passing flatus and had 2 BM yesterday. Tolerating diet. Has been getting up       Objective:     Blood pressure 137/84, pulse 84, temperature 98.6 ??F (37 ??C), resp. rate 16, height 5\' 4"  (1.626 m), weight 233 lb 11 oz (106 kg), SpO2 95 %.  Temp (24hrs), Avg:98.4 ??F (36.9 ??C), Min:97.9 ??F (36.6 ??C), Max:98.7 ??F (37.1 ??C)    CT of the abdomen:   Since the previous scan there is been no significant interval change in the  appearance of the collection in the right anterior abdomen or the position of  the pigtail catheter. 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    _____________________  Physical Exam:     Alert and Oriented x3, in no acute distress.  Cardiovascular: RRR, no peripheral edema  Lungs:CTAB   Abdomen: abdomen soft, r. Drain with purulent drainage 20cc. Active bowel sounds       Assessment:   Principal Problem:    Peritoneal abscess (HCC) (07/17/2017)    Active Problems:    Diabetes (HCC) (01/24/2013)      Hypertension complicating diabetes (HCC) (05/25/2016)      Morbid obesity due to excess calories (HCC) (05/25/2016)      Acute gastric ulcer with perforation (HCC) (05/13/2017)      Overview: S/p laporascopic repair and closure 05/12/2017            Plan:     Drain management/ document I & O's  Pain management   OOB  IV abx  Continue diet     Data Review:    Recent Labs     07/24/17  0417   WBC 7.7   HGB 11.7   HCT 36.3   PLT 336     Recent Labs     07/25/17  0416 07/24/17  0417   NA 136 138   K 3.2* 2.9*   CL 101 100   CO2 23 27   GLU 151* 167*   BUN 15 18   CREA 0.66 0.65   CA 8.3* 8.7   MG 1.5*  --    ALB  --  2.3*   SGOT  --  14*   ALT  --  21     Recent Labs     07/24/17   0417   LPSE 46*           ______________________  Medications:    Current Facility-Administered Medications   Medication Dose Route Frequency   ??? trimethoprim-sulfamethoxazole (BACTRIM DS, SEPTRA DS) 160-800 mg per tablet 1 Tab  1 Tab Oral Q12H   ??? potassium chloride SR (KLOR-CON 10) tablet 10 mEq  10 mEq Oral DAILY   ??? potassium chloride SR (KLOR-CON 10) tablet 40 mEq  40 mEq Oral BID   ??? insulin NPH (NOVOLIN N, HUMULIN N) injection 32 Units  32 Units SubCUTAneous QHS   ??? HYDROcodone-acetaminophen (NORCO) 5-325 mg per tablet 1 Tab  1 Tab Oral Q4H PRN   ??? colchicine tablet 0.6 mg  0.6 mg Oral TID   ??? sodium chloride (NS) flush 5-10 mL  5-10  mL IntraVENous RAD PRN   ??? sucralfate (CARAFATE) 100 mg/mL oral suspension 1 g  1 g Oral AC&HS   ??? pantoprazole (PROTONIX) tablet 40 mg  40 mg Oral BID   ??? sertraline (ZOLOFT) tablet 100 mg  100 mg Oral DAILY   ??? traMADol (ULTRAM) tablet 50 mg  50 mg Oral Q8H PRN   ??? triamterene-hydroCHLOROthiazide (MAXZIDE) 37.5-25 mg per tablet 1 Tab  1 Tab Oral DAILY   ??? insulin lispro (HUMALOG) injection   SubCUTAneous TIDAC   ??? glucose chewable tablet 16 g  4 Tab Oral PRN   ??? dextrose (D50W) injection syrg 12.5-25 g  12.5-25 g IntraVENous PRN   ??? glucagon (GLUCAGEN) injection 1 mg  1 mg IntraMUSCular PRN   ??? zolpidem (AMBIEN) tablet 5 mg  5 mg Oral QHS PRN   ??? sodium chloride (NS) flush 5-10 mL  5-10 mL IntraVENous Q8H   ??? sodium chloride (NS) flush 5-10 mL  5-10 mL IntraVENous PRN   ??? acetaminophen (TYLENOL) tablet 650 mg  650 mg Oral Q4H PRN   ??? heparin (porcine) injection 5,000 Units  5,000 Units SubCUTAneous Q8H   ??? amLODIPine (NORVASC) tablet 5 mg  5 mg Oral DAILY   ??? atorvastatin (LIPITOR) tablet 20 mg  20 mg Oral QHS       Symone M Hopkins  07/26/2017        I agree w/ assessment and plan by student  Hopefully home soon

## 2017-07-26 NOTE — Progress Notes (Signed)
NUTRITION COMPLETE ASSESSMENT    RECOMMENDATIONS:   1. Continue current diet   2. Weekly weights   Interventions/Plan:   Food/Nutrient Delivery:  (regular diet)            Assessment:   Reason for Assessment: [x] LOS     Diet: Consistent carb  Supplements: none  Nutritionally Significant Medications: [x]  Reviewed & Includes: SSI, NPH (32 units), protonix, KCl, zoloft, carafate,   Meal Intake:   Patient Vitals for the past 100 hrs:   % Diet Eaten   07/25/17 1736 75 %   07/25/17 1021 0 %   07/23/17 1618 100 %   07/22/17 1010 100 %     Subjective: I'm doing ok food-wise.     Objective:  Pt admitted for peritoneal abscess. PMHx: gastric ulcer with perforation, DM, HTN, hypercholesterolemia. Recent admit for same with ulcer repair about 9 weeks ago (05/2017). Surgery following. CT yesterday showing: "fistulous connection between this collection and the distal stomach." Remains on diet.     Low Mg, K+ noted. Add daily MVI and continue to monitor with repletion PRN.     Pt visited today. She reports tolerating meals with good appetite. Initially lost weight (see below) - but has been stable around 230#. No questions or concerns at this time.     On TPN earlier this month with severe wt loss prior to that admit. Stable at 233# for past month. UBW 250#.  Wt Readings:   07/17/17 106 kg (233 lb 11 oz)   07/14/17 106 kg (233 lb 9.6 oz)   07/01/17 105.2 kg (232 lb)   06/09/17 107 kg (236 lb)   05/24/17 105.2 kg (232 lb)   05/23/17 106.6 kg (235 lb)   05/16/17 115.3 kg (254 lb 3.1 oz)     Will continue to follow for PO intake, wt trends, and plans for surgery/MD.  Estimated Nutrition Needs:   Kcals/day: 2120 Kcals/day(2120kcal)  Protein: 106 g(1g/kg)  Fluid: 2120 ml(86m/kcal)  Based On: Kcal/kg - specify (Comment)(20kcal/kg)  Weight Used: Actual wt(106kg)    Pt expected to meet estimated nutrient needs:  [x]    Yes     []   No []  Unable to predict at this time  Nutrition Diagnosis:    1. Unintended weight loss(improving) related to previous inadequate oral intake as evidenced by wt loss over past couple months; now stable wt with >75% meals consumed    Goals:     Continued consumption of at least 75% meals     Monitoring & Evaluation:    - Total energy intake    Previous Nutrition Goals Met:   N/A  Previous Recommendations:    N/A    Education & Discharge Needs:   [x]  None Identified   []  Identified and addressed    [x]  Participated in care plan, discharge planning, and/or interdisciplinary rounds        Cultural, religious and ethnic food preferences identified: None    Skin Integrity: [x] Intact  [] Other  Edema: [] None [x] Other: 1+ LLE  Last BM: 11/19  Food Allergies: [x] None [] Other  Diet Restrictions: Cultural/Religious Preference(s): None     Anthropometrics:    Weight Loss Metrics 07/17/2017 07/14/2017 07/01/2017 06/09/2017 05/24/2017 05/23/2017 05/16/2017   Today's Wt 233 lb 11 oz 233 lb 9.6 oz 232 lb 236 lb 232 lb 235 lb 254 lb 3.1 oz   BMI 40.11 kg/m2 40.1 kg/m2 39.82 kg/m2 40.51 kg/m2 39.82 kg/m2 36.81 kg/m2 39.81 kg/m2  Height: 5' 4"  (162.6 cm),    Body mass index is 40.11 kg/m??.    Labs:    Lab Results   Component Value Date/Time    Sodium 136 07/25/2017 04:16 AM    Potassium 3.2 (L) 07/25/2017 04:16 AM    Chloride 101 07/25/2017 04:16 AM    CO2 23 07/25/2017 04:16 AM    Glucose 151 (H) 07/25/2017 04:16 AM    BUN 15 07/25/2017 04:16 AM    Creatinine 0.66 07/25/2017 04:16 AM    Calcium 8.3 (L) 07/25/2017 04:16 AM    Magnesium 1.5 (L) 07/25/2017 04:16 AM    Phosphorus 3.8 07/14/2017 03:35 AM    Albumin 2.3 (L) 07/24/2017 04:17 AM     Lab Results   Component Value Date/Time    Hemoglobin A1c 7.3 (H) 05/13/2017 03:04 AM    Hemoglobin A1c (POC) 8.4 (A) 07/01/2017 02:50 PM     Dorette Grate, RD Pager [phone] or [phone]

## 2017-07-26 NOTE — Discharge Summary (Signed)
Physician Discharge Summary     Patient ID:  Dana [name]  [id number]  61 y.o.  06/10/1956    Admit date: 07/17/2017    Discharge date and time: 07/26/2017    Briefly, pt was admitted with Peritoneal abscess (HCC).  For details of admission, see H&P.    Hospital Course:  Patient admitted and started on zosyn.  Pigtail catheter placed at site of fluid collection to drain abscess.  She had improvement in abdominal pain but developed severe left foot pain .  She was started on colchicine.  Pain continued.  She was started on low dose prednisone.  She still was unable to bear any weight and foot was injected with slow improvement in pain.  Repeat ct showed catheter was in fluid collection,  Antibiotics were changed to rocephin.  She was continued on iv protonix.  Surgery was comfortable discharging her with catheter and po antibiotics.  Blood sugars were managed with lantus and sliding scale insulin.    Discharge Dx: intra abdominal abscess, gout left foot, diabetes, hypertension complicating diabetes.    Condition at discharge: improved    Disposition: home with home health    Patient Instructions:   Cannot display discharge medications since this patient is not currently admitted.      Follow-up with Dr. Jahn Franchini in 1 week.      Signed:  Cerise Lieber J Orrie Lascano, MD  08/13/2017  4:37 PM

## 2017-07-26 NOTE — Progress Notes (Signed)
Problem: Mobility Impaired (Adult and Pediatric)  Goal: *Acute Goals and Plan of Care (Insert Text)  Physical Therapy Goals  Initiated 07/22/2017  1.  Patient will move from supine to sit and sit to supine  in bed with modified independence within 7 day(s).    2.  Patient will transfer from bed to chair and chair to bed with modified independence using the least restrictive device within 7 day(s).  3.  Patient will perform sit to stand with modified independence within 7 day(s).  4.  Patient will ambulate with modified independence for 20 feet with the least restrictive device within 7 day(s).   5.  Patient will ascend/descend 4 stairs with 1 handrail(s) with modified independence within 7 day(s).   physical Therapy TREATMENT  Patient: Dana [name] ([age] y.o. female)  Date: 07/26/2017  Diagnosis: Peritoneal abscess [facility](HCC) Peritoneal abscess (HCC)      Precautions: Fall(gout in L foot)  Chart, physical therapy assessment, plan of care and goals were reviewed.    ASSESSMENT:  Pt reports feeling better. Pt was able to increase gait tolerance and decrease assist level. Pt is mobilizing at a mod I level. Pt is eager to discharge home. Pt is not expressing concerns about mobility but is hopeful not to have to return to the hospital. Pt will not need any PT at discharge.  Progression toward goals:  [x]     Improving appropriately and progressing toward goals  []     Improving slowly and progressing toward goals  []     Not making progress toward goals and plan of care will be adjusted     PLAN:  Patient continues to benefit from skilled intervention to address the above impairments.  Continue treatment per established plan of care.  Discharge Recommendations:  None  Further Equipment Recommendations for Discharge:  None per report has a rolling walker if needed      SUBJECTIVE:   Patient stated ???I can walk.???    OBJECTIVE DATA SUMMARY:   Critical Behavior:  Neurologic State: Alert  Orientation Level: Oriented X4         Functional Mobility Training:  Bed Mobility:     Supine to Sit: Modified independent              Transfers:  Sit to Stand: Modified independent  Stand to Sit: Modified independent                             Balance:  Sitting: Intact  Standing: Intact;With supportAmbulation/Gait Training:  Distance (ft): 300 Feet (ft)  Assistive Device: Walker, rolling  Ambulation - Level of Assistance: Modified independent        Gait Abnormalities: Antalgic;Decreased step clearance           Stance: Left decreased  Speed/Cadence: Pace decreased (<100 feet/min)  Step Length: Left shortened;Right shortened                    Stairs:              Neuro Re-Education:    Therapeutic Exercises:     Pain:  Pain Scale 1: Numeric (0 - 10)  Pain Intensity 1: 2              Activity Tolerance:   Limited   Please refer to the flowsheet for vital signs taken during this treatment.  After treatment:   []     Patient left in no apparent  distress sitting up in chair  [x]     Patient left in no apparent distress in bed  [x]     Call bell left within reach  [x]     Nursing notified  []     Caregiver present  []     Bed alarm activated    COMMUNICATION/COLLABORATION:   The patient???s plan of care was discussed with: Registered Nurse    Ernie AvenaSarah C Makylee Sanborn, PTA   Time Calculation: 18 mins

## 2017-07-26 NOTE — Progress Notes (Signed)
Medical Progress Note      NAME: Dana [name]   DOB:  1955/12/14  MRM:  [id number]    Date/Time: 07/26/2017  8:29 AM    Problem List:   Principal Problem:    Peritoneal abscess (HCC) (07/17/2017)    Active Problems:    Diabetes (HCC) (01/24/2013)      Hypertension complicating diabetes (HCC) (05/25/2016)      Morbid obesity due to excess calories (HCC) (05/25/2016)      Acute gastric ulcer with perforation (HCC) (05/13/2017)      Overview: S/p laporascopic repair and closure 05/12/2017           Subjective:     Patient c/o abd pain around catheter.  Foot is better,  She can almost put full weight on it.  Lost iv last night.    Past Medical History:   Diagnosis Date   ??? Diabetes (HCC) 01/24/2013   ??? GERD (gastroesophageal reflux disease)    ??? Hives    ??? HTN (hypertension)    ??? Hypercholesterolemia    ??? Perforated gastric ulcer (HCC)        ROS:  General: negative for fever, chills, sweats, weakness  Respiratory:  negative for cough, sputum production, SOB, wheezing, DOE, pleuritic pain  Cardiology:  negative for chest pain, palpitations, orthopnea, PND, edema, syncope   Gastrointestinal: positive for abd pain around percutaneous catheter  Muskuloskeletal : positive for decreasing left foot pain         Objective:       Vitals:          Last 24hrs VS reviewed since prior progress note. Most recent are:    Visit Vitals  BP 132/68 (BP 1 Location: Right arm, BP Patient Position: At rest)   Pulse 93   Temp 98.7 ??F (37.1 ??C)   Resp 16   Ht 5\' 4"  (1.626 m)   Wt 233 lb 11 oz (106 kg)   SpO2 96%   BMI 40.11 kg/m??     SpO2 Readings from Last 6 Encounters:   07/25/17 96%   07/14/17 96%   06/09/17 98%   05/23/17 98%   05/18/17 93%   05/20/15 94%    O2 Flow Rate (L/min): 2 l/min       Intake/Output Summary (Last 24 hours) at 07/26/2017 13080829  Last data filed at 07/26/2017 0645  Gross per 24 hour   Intake 320 ml   Output 30 ml   Net 290 ml          Exam:     General   Obese 61 yo wf   Respiratory   Clear To Auscultation bilaterally - no wheezes, rales, rhonchi, or crackles  Cardiology  regular  Abdominal  Soft, good bowel sounds  Extremities  No clubbing, cyanosis, or edema. Pulses intact.    Lab Data Reviewed: (see below)    Medications Reviewed: (see below)    ______________________________________________________________________    Medications:     Current Facility-Administered Medications   Medication Dose Route Frequency   ??? trimethoprim-sulfamethoxazole (BACTRIM DS, SEPTRA DS) 160-800 mg per tablet 1 Tab  1 Tab Oral Q12H   ??? potassium chloride SR (KLOR-CON 10) tablet 10 mEq  10 mEq Oral DAILY   ??? potassium chloride SR (KLOR-CON 10) tablet 40 mEq  40 mEq Oral BID   ??? insulin NPH (NOVOLIN N, HUMULIN N) injection 32 Units  32 Units SubCUTAneous QHS   ??? HYDROcodone-acetaminophen (NORCO) 5-325 mg per tablet 1 Tab  1 Tab Oral Q4H PRN   ??? colchicine tablet 0.6 mg  0.6 mg Oral TID   ??? sodium chloride (NS) flush 5-10 mL  5-10 mL IntraVENous RAD PRN   ??? sucralfate (CARAFATE) 100 mg/mL oral suspension 1 g  1 g Oral AC&HS   ??? pantoprazole (PROTONIX) tablet 40 mg  40 mg Oral BID   ??? sertraline (ZOLOFT) tablet 100 mg  100 mg Oral DAILY   ??? traMADol (ULTRAM) tablet 50 mg  50 mg Oral Q8H PRN   ??? triamterene-hydroCHLOROthiazide (MAXZIDE) 37.5-25 mg per tablet 1 Tab  1 Tab Oral DAILY   ??? insulin lispro (HUMALOG) injection   SubCUTAneous TIDAC   ??? glucose chewable tablet 16 g  4 Tab Oral PRN   ??? dextrose (D50W) injection syrg 12.5-25 g  12.5-25 g IntraVENous PRN   ??? glucagon (GLUCAGEN) injection 1 mg  1 mg IntraMUSCular PRN   ??? zolpidem (AMBIEN) tablet 5 mg  5 mg Oral QHS PRN   ??? sodium chloride (NS) flush 5-10 mL  5-10 mL IntraVENous Q8H   ??? sodium chloride (NS) flush 5-10 mL  5-10 mL IntraVENous PRN   ??? acetaminophen (TYLENOL) tablet 650 mg  650 mg Oral Q4H PRN   ??? heparin (porcine) injection 5,000 Units  5,000 Units SubCUTAneous Q8H   ??? amLODIPine (NORVASC) tablet 5 mg  5 mg Oral DAILY    ??? atorvastatin (LIPITOR) tablet 20 mg  20 mg Oral QHS            Lab Review:     Recent Labs     07/24/17  0417   WBC 7.7   HGB 11.7   HCT 36.3   PLT 336     Recent Labs     07/25/17  0416 07/24/17  0417 07/23/17  0943   NA 136 138 135*   K 3.2* 2.9* 2.8*   CL 101 100 99   CO2 23 27 27    GLU 151* 167* 184*   BUN 15 18 16    CREA 0.66 0.65 0.58   CA 8.3* 8.7 9.2   MG 1.5*  --   --    ALB  --  2.3*  --    TBILI  --  0.2  --    SGOT  --  14*  --    ALT  --  21  --      Lab Results   Component Value Date/Time    Glucose (POC) 170 (H) 07/26/2017 06:26 AM    Glucose (POC) 146 (H) 07/25/2017 09:12 PM    Glucose (POC) 145 (H) 07/25/2017 04:39 PM    Glucose (POC) 146 (H) 07/25/2017 11:35 AM    Glucose (POC) 161 (H) 07/25/2017 06:37 AM     No results for input(s): PH, PCO2, PO2, HCO3, FIO2 in the last 72 hours.  No results for input(s): INR in the last 72 hours.    No lab exists for component: INREXT    Other pertinent lab: NA         Assessment:     Patient Active Problem List   Diagnosis Code   ??? Hypercholesterolemia E78.00   ??? Diabetes (HCC) E11.9   ??? Sleep apnea G47.30   ??? Hypertension complicating diabetes (HCC) E11.59, I10   ??? Morbid obesity due to excess calories (HCC) E66.01   ??? Hives L50.9   ??? Acute gastric ulcer with perforation (HCC) K25.1   ??? Arthralgia of both knees M25.561, M25.562   ???  Abdominal pain R10.9   ??? Peritoneal abscess (HCC) K65.1          Plan:                 1. 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  Can change to po bactrim  2. Diabetes- improved. Continue lantus and ssi  3.  hypertesion- continue to monitor  4. ?dispo- home with catheter /?repair of fistula vs. Allowing to heal naturally?              ___________________________________________________    Attending Physician: Lake BellsNancy J Kanon Colunga, MD

## 2017-07-26 NOTE — Progress Notes (Signed)
Bedside shift change report given to Vikki PortsValerie RN (oncoming nurse) by Glee ArvinLatoya RN  (offgoing nurse). Report included the following information SBAR, Kardex, ED Summary, Procedure Summary, Intake/Output, MAR, Accordion and Recent Results.

## 2017-07-26 NOTE — Progress Notes (Signed)
No complaints this PM.  Tm 98.7 HR: 93 BP: 120/74 Resp Rate: 16 96% sat on room air.  Drain Output - 30ml for 24 hours.    Intake/Output Summary (Last 24 hours) at 07/26/2017 1731  Last data filed at 07/26/2017 0936  Gross per 24 hour   Intake 330 ml   Output 30 ml   Net 300 ml   Exam: Cor: RRR.              Lungs: Bilateral breath sounds. Clear to auscultation.              Abd: Soft. Tender in epigastrium.           No guarding or rebound.             Purulent appearing drainage.  Labs:   Recent Results (from the past 12 hour(s))   GLUCOSE, POC    Collection Time: 07/26/17  6:26 AM   Result Value Ref Range    Glucose (POC) 170 (H) 65 - 100 mg/dL    Performed by Tobias AlexanderMiller Katarina    GLUCOSE, POC    Collection Time: 07/26/17 11:22 AM   Result Value Ref Range    Glucose (POC) 227 (H) 65 - 100 mg/dL    Performed by Helyn AppPOWELL  PATRICIA    GLUCOSE, POC    Collection Time: 07/26/17  4:04 PM   Result Value Ref Range    Glucose (POC) 156 (H) 65 - 100 mg/dL    Performed by Aldean JewettFabian Madison    Reviewed recent CT Scan.   Will discharge to home with drain.   Diet and activity as tolerated.   Follow up with Dr. Amada JupiterShindel next week or earlier if need be.   Plans per Dr. Cherly HensenPahle.

## 2017-07-26 NOTE — Other (Signed)
Dear Dr. Marrion Coyitus/Pahle :     The insurance company has denied the inpatient status for the LAST THREE DAYS of her stay in hospital for one of your patients.   Now we are requesting you to complete a peer to peer review with [facility] medical doctor and justify your clinical decision. Only you are allowed to do this peer to peer review.     You will need to do the following:   ? Within next ONE week   ? Please call [employer] at  [phone], then follow the prompt for medical management and then choose peer to peer review.   ? Talk to their staff and setup a peer to peer review with their doctor at a mutually convenient time   ? Prepare your argument to defend your decision; you may refer to the criteria I have provided  - see below - next to [facility] (criteria)   ? Complete review with him/her   ? Send an email reply to me with   1. Name of doctor you did the peer to peer   2. Outcome (approved inpatient or denied)   3. Date you did the review   If you need help, you are more than welcome to talk to me anytime, call me on my cell number listed below     Policy Number :   [id number]??      Reference/Auth Number Auth number: [id number]     Pt Name:  Dana [name]   MR#  [id number]   CSN#   [id number]   Admit date  07/17/2017 12:07 PM   Discharge date  07/26/2017   Discharging doctor  Lake BellsPahle, Nancy J   Admitting doctor Prudencio Pairitus, C Kent   Principal diagnosis  Peritoneal abscess [facility](HCC)        Insurance  Payor: BLUE CROSS / Plan: [place] P[place] / Product Type: PPO /      Clinicals    61 y.o. y.o. female who was requiring IV Dilaudid in addition to other supportive and PO pain medications through the dates that the insurance company denied.   Here is a partial record of her narcotics administration during those denied dates for your reference.       HYDROmorphone (DILAUDID) injection 1 mg ?? [[id number]][[id number]]   Order Details    Ordered Dose: 1 mg Route: IntraVENous Frequency: EVERY 6 HOURS AS NEEDED for Severe Pain   Administration Dose: 1 mg      Scheduled Start Date/Time: 07/21/17 0900 End Date/Time: 07/25/17 2214        Admin Instructions:   For IV administration - give slowly over at least 2-3 minutes           Order Status: Discontinued   Ordering User: Lake BellsPahle, Nancy J, MD Ordering Date/Time: Thu Jul 21, 2017 0805   Ordering Provider: Lake BellsPahle, Nancy J, MD Authorizing Provider: Lake BellsPahle, Nancy J, MD   Most Recent Dispense Information         Pharmacy Actions     Date/Time Type User Pharmacy   Mon Jul 25, 2017 1455 Waste Edi, Ads Dispense [facility]   Mon Jul 25, 2017 1455 Dispense Edi, Ads [facility]   Mon Jul 25, 2017 0409 Waste Edi, [facility]   Mon Jul 25, 2017 0409 Dispense Edi, Ads [facility]pense [facility]   Sun Jul 24, 2017 1925 Waste Edi, Ads Dispense [facility]  Wynelle LinkSun Jul 24, 2017 1925 Dispense Edi, [facility]ds [facility]   Sun Jul 24, 2017 1353 Waste Edi, [facility]ds [facility]pense [facility]   Sun Jul 24, 2017 1328 Dispense Edi, [facility]ds [facility]   Sun Jul 24, 2017 0715 Waste Edi, Ads Dispense [facility]   Sun Jul 24, 2017 0715 Waste Edi, [facility]ds [facility]pense [facility]   Sun Jul 24, 2017 0712 Dispense Edi, Ads Dispense [facility]   Sun Jul 24, 2017 0116 Dispense Edi, [facility]ense [facility]   Sat Jul 23, 2017 1918 Waste Edi, Ads Dispense [facility]   Sat Jul 23, 2017 1903 Dispense Edi, Ads Dispense [facility]   Sat Jul 23, 2017 1326 Waste Edi, Ads Dispense [facility]   Sat Jul 23, 2017 1326 Dispense Edi, Ads Dispense [facility]   Fri Jul 22, 2017 2124 Waste Edi, Ads Dispense [facility]   Fri Jul 22, 2017 2124 Dispense Edi, Ads Dispense [facility]   Fri Jul 22, 2017 1247 Waste Edi, Ads Dispense [facility]   Fri Jul 22, 2017 0715 Waste Edi, Ads Dispense [facility]   Fri Jul 22, 2017 0659 Dispense Edi, Ads Dispense [facility]   Thu Jul 21, 2017 2057 Waste Edi, Ads Dispense [facility]    Thu Jul 21, 2017 2057 Dispense Edi, Ads Dispense [facility]   Thu Jul 21, 2017 1433 Dispense Edi, Ads Dispense [facility]   Thu Jul 21, 2017 0831 Verify Marolyn HammockCuddy, Allyson R, PHARMD [facility] 5E2 ADS        [place]    Pain Management GRG GRG: PG-PM (ISC GRG)    Severe pain requiring acute inpatient management as indicated by 1 or more of the following:  ?? Continuous or frequent (eg, every 2 to 4 hours) parenteral analgesics required[A]  ?? Necessity (ie, alternative approaches not effective) for analgesic regimen that can only be performed or initiated in inpatient setting       Sincerely     Sherian ReinHasan Xenia Nile MD MPH FACP   Physician Adviser, Care Management & CDMP  [facility] [place]   Cell: [phone]

## 2017-07-26 NOTE — Discharge Summary (Signed)
Physician Discharge Summary     Patient ID:  Dana [name]  [id number]  61 y.o.  [birth year]    Admit date: 07/17/2017    Discharge date and time: 07/26/2017    Briefly, pt was admitted with Peritoneal abscess (HCC).  For details of admission, see H&P.    Hospital Course:  Patient admitted and started on zosyn.  Pigtail catheter placed at site of fluid collection to drain abscess.  She had improvement in abdominal pain but developed severe left foot pain .  She was started on colchicine.  Pain continued.  She was started on low dose prednisone.  She still was unable to bear any weight and foot was injected with slow improvement in pain.  Repeat ct showed catheter was in fluid collection,  Antibiotics were changed to rocephin.  She was continued on iv protonix.  Surgery was comfortable discharging her with catheter and po antibiotics.  Blood sugars were managed with lantus and sliding scale insulin.    Discharge Dx: intra abdominal abscess, gout left foot, diabetes, hypertension complicating diabetes.    Condition at discharge: improved    Disposition: home with home health    Patient Instructions:   Cannot display discharge medications since this patient is not currently admitted.      Follow-up with Dr. Cherly HensenPahle in 1 week.      Signed:  Lake BellsNancy J Hellon Vaccarella, MD  08/13/2017  4:37 PM

## 2017-07-29 ENCOUNTER — Inpatient Hospital Stay: Admit: 2017-07-29 | Payer: BLUE CROSS/BLUE SHIELD | Primary: Internal Medicine

## 2017-07-29 ENCOUNTER — Ambulatory Visit
Admit: 2017-07-29 | Discharge: 2017-07-29 | Payer: PRIVATE HEALTH INSURANCE | Attending: Nurse Practitioner | Primary: Internal Medicine

## 2017-07-29 ENCOUNTER — Inpatient Hospital Stay
Admit: 2017-07-29 | Discharge: 2017-08-11 | Disposition: A | Discharge status: Home / Self Care | Payer: BLUE CROSS/BLUE SHIELD | Source: Ambulatory Visit | Attending: Surgery | Admitting: Surgery

## 2017-07-29 DIAGNOSIS — K651 Peritoneal abscess: Secondary | ICD-10-CM

## 2017-07-29 DIAGNOSIS — T8143XA Infection following a procedure, organ and space surgical site, initial encounter: Secondary | ICD-10-CM

## 2017-07-29 LAB — METABOLIC PANEL, COMPREHENSIVE
A-G Ratio: 0.5 — ABNORMAL LOW (ref 1.1–2.2)
ALT (SGPT): 26 U/L (ref 12–78)
AST (SGOT): 40 U/L — ABNORMAL HIGH (ref 15–37)
Albumin: 2.6 g/dL — ABNORMAL LOW (ref 3.5–5.0)
Alk. phosphatase: 68 U/L (ref 45–117)
Anion gap: 10 mmol/L (ref 5–15)
BUN/Creatinine ratio: 15 (ref 12–20)
BUN: 14 MG/DL (ref 6–20)
Bilirubin, total: 0.6 MG/DL (ref 0.2–1.0)
CO2: 27 mmol/L (ref 21–32)
Calcium: 9.2 MG/DL (ref 8.5–10.1)
Chloride: 95 mmol/L — ABNORMAL LOW (ref 97–108)
Creatinine: 0.94 MG/DL (ref 0.55–1.02)
GFR est AA: >60 mL/min/{1.73_m2} (ref >60)
GFR est non-AA: >60 mL/min/{1.73_m2} (ref >60)
Globulin: 5.7 g/dL — ABNORMAL HIGH (ref 2.0–4.0)
Glucose: 132 mg/dL — ABNORMAL HIGH (ref 65–100)
Potassium: 4.4 mmol/L (ref 3.5–5.1)
Protein, total: 8.3 g/dL — ABNORMAL HIGH (ref 6.4–8.2)
Sodium: 132 mmol/L — ABNORMAL LOW (ref 136–145)

## 2017-07-29 LAB — CBC W/O DIFF
ABSOLUTE NRBC: 0 10*3/uL (ref 0.00–0.01)
HCT: 38.8 % (ref 35.0–47.0)
HGB: 12.7 g/dL (ref 11.5–16.0)
MCH: 28.8 PG (ref 26.0–34.0)
MCHC: 32.7 g/dL (ref 30.0–36.5)
MCV: 88 FL (ref 80.0–99.0)
MPV: 10.3 FL (ref 8.9–12.9)
NRBC: 0 PER 100 WBC
PLATELET: 334 10*3/uL (ref 150–400)
RBC: 4.41 M/uL (ref 3.80–5.20)
RDW: 14.5 % (ref 11.5–14.5)
WBC: 9.4 10*3/uL (ref 3.6–11.0)

## 2017-07-29 LAB — GLUCOSE, POC
Glucose (POC): 137 mg/dL — ABNORMAL HIGH (ref 65–100)
Glucose (POC): 97 mg/dL (ref 65–100)

## 2017-07-29 MED ORDER — GLUCOSE 4 GRAM CHEWABLE TAB
4 gram | ORAL | Status: DC | PRN
Start: 2017-07-29 — End: 2017-08-11

## 2017-07-29 MED ORDER — AMLODIPINE 5 MG TAB
5 mg | Freq: Every day | ORAL | Status: DC
Start: 2017-07-29 — End: 2017-08-11
  Administered 2017-07-29 – 2017-08-11 (×14): via ORAL

## 2017-07-29 MED ORDER — IOPAMIDOL 76 % IV SOLN
370 mg iodine /mL (76 %) | Freq: Once | INTRAVENOUS | Status: AC
Start: 2017-07-29 — End: 2017-07-29
  Administered 2017-07-29: 20:00:00 via INTRAVENOUS

## 2017-07-29 MED ORDER — AMLODIPINE 5 MG TAB
5 mg | Freq: Every day | ORAL | Status: DC
Start: 2017-07-29 — End: 2017-07-29

## 2017-07-29 MED ORDER — PIPERACILLIN-TAZOBACTAM 3.375 GRAM IV SOLR
3.375 gram | Freq: Four times a day (QID) | INTRAVENOUS | Status: DC
Start: 2017-07-29 — End: 2017-07-29

## 2017-07-29 MED ORDER — PIPERACILLIN-TAZOBACTAM 3.375 GRAM IV SOLR
3.375 gram | Freq: Three times a day (TID) | INTRAVENOUS | Status: DC
Start: 2017-07-29 — End: 2017-07-29
  Administered 2017-07-29: 19:00:00 via INTRAVENOUS

## 2017-07-29 MED ORDER — ONDANSETRON (PF) 4 MG/2 ML INJECTION
4 mg/2 mL | Freq: Four times a day (QID) | INTRAMUSCULAR | Status: DC | PRN
Start: 2017-07-29 — End: 2017-08-11
  Administered 2017-07-29: 23:00:00 via INTRAVENOUS

## 2017-07-29 MED ORDER — DIPHENHYDRAMINE 12.5 MG/5 ML ELIXIR
12.5 mg/5 mL | Freq: Four times a day (QID) | ORAL | Status: DC | PRN
Start: 2017-07-29 — End: 2017-08-11
  Administered 2017-08-01 – 2017-08-02 (×2): via ORAL

## 2017-07-29 MED ORDER — SODIUM CHLORIDE 0.9 % IV PIGGY BACK
1 gram | Freq: Two times a day (BID) | INTRAVENOUS | Status: DC
Start: 2017-07-29 — End: 2017-08-11
  Administered 2017-07-29 – 2017-08-11 (×26): via INTRAVENOUS

## 2017-07-29 MED ORDER — HYDROMORPHONE 2 MG/ML INJECTION SOLUTION
2 mg/mL | INTRAMUSCULAR | Status: DC | PRN
Start: 2017-07-29 — End: 2017-07-29

## 2017-07-29 MED ORDER — ACETAMINOPHEN 325 MG TABLET
325 mg | ORAL | Status: DC | PRN
Start: 2017-07-29 — End: 2017-08-11
  Administered 2017-07-29 – 2017-08-09 (×15): via ORAL

## 2017-07-29 MED ORDER — GLUCAGON 1 MG INJECTION
1 mg | INTRAMUSCULAR | Status: DC | PRN
Start: 2017-07-29 — End: 2017-08-11

## 2017-07-29 MED ORDER — IOHEXOL 240 MG/ML IV SOLN
240 mg iodine/mL | Freq: Once | INTRAVENOUS | Status: AC
Start: 2017-07-29 — End: 2017-07-29
  Administered 2017-07-29: 17:00:00 via ORAL

## 2017-07-29 MED ORDER — SODIUM CHLORIDE 0.9 % IJ SYRG
Freq: Once | INTRAMUSCULAR | Status: AC
Start: 2017-07-29 — End: 2017-07-29
  Administered 2017-07-29: 20:00:00 via INTRAVENOUS

## 2017-07-29 MED ORDER — HYDROMORPHONE 2 MG/ML INJECTION SOLUTION
2 mg/mL | INTRAMUSCULAR | Status: DC | PRN
Start: 2017-07-29 — End: 2017-08-11
  Administered 2017-07-31 – 2017-08-11 (×31): via INTRAVENOUS

## 2017-07-29 MED ORDER — TRIAMTERENE-HYDROCHLOROTHIAZIDE 37.5 MG-25 MG TAB
Freq: Every day | ORAL | Status: DC
Start: 2017-07-29 — End: 2017-08-11
  Administered 2017-07-29 – 2017-08-11 (×14): via ORAL

## 2017-07-29 MED ORDER — SODIUM CHLORIDE 0.9 % INJECTION
40 mg | Freq: Two times a day (BID) | INTRAMUSCULAR | Status: DC
Start: 2017-07-29 — End: 2017-08-03
  Administered 2017-07-29 – 2017-08-03 (×11): via INTRAVENOUS

## 2017-07-29 MED ORDER — SODIUM CHLORIDE 0.9% BOLUS IV
0.9 % | Freq: Once | INTRAVENOUS | Status: AC
Start: 2017-07-29 — End: 2017-07-29
  Administered 2017-07-29: 20:00:00 via INTRAVENOUS

## 2017-07-29 MED ORDER — LORAZEPAM 1 MG TAB
1 mg | Freq: Three times a day (TID) | ORAL | Status: DC | PRN
Start: 2017-07-29 — End: 2017-08-11
  Administered 2017-07-30 – 2017-08-06 (×2): via ORAL

## 2017-07-29 MED ORDER — DEXTROSE 50% IN WATER (D50W) IV SYRG
INTRAVENOUS | Status: DC | PRN
Start: 2017-07-29 — End: 2017-08-11

## 2017-07-29 MED ORDER — LACTATED RINGERS IV
INTRAVENOUS | Status: DC
Start: 2017-07-29 — End: 2017-08-04
  Administered 2017-07-29 – 2017-07-31 (×4): via INTRAVENOUS

## 2017-07-29 MED ORDER — SODIUM CHLORIDE 0.9 % IJ SYRG
INTRAMUSCULAR | Status: DC | PRN
Start: 2017-07-29 — End: 2017-08-11
  Administered 2017-08-04 – 2017-08-10 (×9): via INTRAVENOUS

## 2017-07-29 MED ORDER — INSULIN REGULAR HUMAN 100 UNIT/ML INJECTION
100 unit/mL | Freq: Four times a day (QID) | INTRAMUSCULAR | Status: DC
Start: 2017-07-29 — End: 2017-07-29

## 2017-07-29 MED ORDER — SERTRALINE 50 MG TAB
50 mg | Freq: Every day | ORAL | Status: DC
Start: 2017-07-29 — End: 2017-08-11
  Administered 2017-07-30 – 2017-08-11 (×13): via ORAL

## 2017-07-29 MED ORDER — LACTATED RINGERS IV
INTRAVENOUS | Status: DC
Start: 2017-07-29 — End: 2017-07-29
  Administered 2017-07-29: 19:00:00 via INTRAVENOUS

## 2017-07-29 MED ORDER — SODIUM CHLORIDE 0.9 % IJ SYRG
Freq: Three times a day (TID) | INTRAMUSCULAR | Status: DC
Start: 2017-07-29 — End: 2017-08-11
  Administered 2017-07-29 – 2017-08-11 (×44): via INTRAVENOUS

## 2017-07-29 MED FILL — TRIAMTERENE-HYDROCHLOROTHIAZIDE 37.5 MG-25 MG TAB: ORAL | Qty: 1

## 2017-07-29 MED FILL — LACTATED RINGERS IV: INTRAVENOUS | Qty: 1000

## 2017-07-29 MED FILL — ONDANSETRON (PF) 4 MG/2 ML INJECTION: 4 mg/2 mL | INTRAMUSCULAR | Qty: 2

## 2017-07-29 MED FILL — NORMAL SALINE FLUSH 0.9 % INJECTION SYRINGE: INTRAMUSCULAR | Qty: 10

## 2017-07-29 MED FILL — AMLODIPINE 5 MG TAB: 5 mg | ORAL | Qty: 1

## 2017-07-29 MED FILL — CEFTRIAXONE 1 GRAM SOLUTION FOR INJECTION: 1 gram | INTRAMUSCULAR | Qty: 1

## 2017-07-29 MED FILL — PROTONIX 40 MG INTRAVENOUS SOLUTION: 40 mg | INTRAVENOUS | Qty: 40

## 2017-07-29 MED FILL — ACETAMINOPHEN 325 MG TABLET: 325 mg | ORAL | Qty: 2

## 2017-07-29 MED FILL — PIPERACILLIN-TAZOBACTAM 3.375 GRAM IV SOLR: 3.375 gram | INTRAVENOUS | Qty: 3.38

## 2017-07-29 NOTE — Progress Notes (Signed)
1. Have you been to the ER, urgent care clinic since your last visit?  Hospitalized since your last visit? [facility]     2. Have you seen or consulted any other health care providers outside of the [facility] since your last visit?  Include any pap smears or colon screening. No

## 2017-07-29 NOTE — Wound Image (Signed)
WOCN Note:     New consult placed by Dr. Nedra HaiLee for drain site management.     Chart shows:  Laparotomy exploratory perforated posterior ulcer repair over sew with patch on 05/12/2017. She was admitted on 07/02/2017 for abdominal pain and 07/17/2017 for peritoneal abscess. 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 She was last discharged home on 07/26/2017. She presents today with complaints of pus draining from around her pigtail drain site    Assessment:   Patient is A&O x 4, continent and mobile.  Patient reports tenderness to skin around drain site.     1. POA, central abdominal drain site with a 4 x 4 cm field of induration and moist red desquamation from effluent.  There is a pinhole very close to the drain insertion site that may be where a stitch pulled loose and then a second hole laterally with moderate amount of purulent malodorous exudate. She reports she was not aware of this earlier today.  I am able to easily express with gentle palpation.   Discussed findings with Dr. Nedra [facility]aiLee via [facility].   Marathon applied to protect skin and dry cover dressing applied - she was getting ready to go to CT.     Recommendations:    Available as needed for skin wound care after plan develops to manage fluid collection. Apply Marathon every 2-3 days after it flakes off to protect the skin from drainage. Change cover dressing frequently.  It would be difficult to successfully pouch both drain site and new opening.      Transition of Care: Plan to follow as needed while admitted to hospital.     Dana [name], BSN, Dana [name], Dana [name]  Certified Wound, Ostomy, Continence Nurse  office [phone]  pager [phone] or call operator to page

## 2017-07-29 NOTE — Other (Signed)
Bedside and Verbal shift change report given to Art therapistAmber RN (oncoming nurse) by Binnie KandNaygil RN (offgoing nurse). Report included the following information SBAR, Kardex, Procedure Summary, Intake/Output, MAR, Accordion and Recent Results.   t

## 2017-07-29 NOTE — Progress Notes (Signed)
History and Physical    Subjective:     Dana [name] is a 61 y.o. status post Laparotomy exploratory perforated posterior ulcer repair over sew with patch on 05/12/2017. She was admitted on 07/02/2017 for abdominal pain and 07/17/2017 for peritoneal abscess.  All subsequent imaging reveals persistent perigastric collection +/- fistula. She was last discharged home on 07/26/2017. She presents today with complaints of pus draining from around her pigtail drain site. She states that the skin around the drain is red and irritated. Attempted to flush in office, saline leaked out of drain insertion site. Denies nausea or vomiting. Has tolerated diet at home. +Bm's    Past Medical History:   Diagnosis Date   ??? Diabetes (HCC) 01/24/2013   ??? GERD (gastroesophageal reflux disease)    ??? Hives    ??? HTN (hypertension)    ??? Hypercholesterolemia    ??? Perforated gastric ulcer (HCC)       Past Surgical History:   Procedure Laterality Date   ??? ABDOMEN SURGERY PROC UNLISTED  05/12/2017    Lap exploratory perforated posterior ulcer repair by Dr. Amada JupiterShindel    ??? HX HYSTERECTOMY       Family History   Problem Relation Age of Onset   ??? Hypertension Mother    ??? Cancer Father         mesothelioma   ??? Diabetes Father    ??? Hypertension Father       Social History     Tobacco Use   ??? Smoking status: Former Smoker     Packs/day: 1.00     Last attempt to quit: 06/27/2011     Years since quitting: 6.0   ??? Smokeless tobacco: Never Used   Substance Use Topics   ??? Alcohol use: Yes     Alcohol/week: 0.6 - 1.2 oz     Types: 1 - 2 Glasses of wine per week     Comment: Per month        Prior to Admission medications    Medication Sig Start Date End Date Taking? Authorizing Provider   CARAFATE 100 mg/mL suspension TAKE 10 ML BY MOUTH FOUR (4) TIMES DAILY FOR 28 DAYS. INDICATIONS: GASTRIC ULCER 07/26/17   Carolynn SayersParker, George A, MD   HYDROmorphone (DILAUDID) 2 mg tablet Take 0.5 Tabs by mouth every four (4)  hours as needed for Pain. Max Daily Amount: 6 mg. 07/26/17   Berneice Gandyogers, Mary S, NP   amLODIPine (NORVASC) 5 mg tablet Take 1 Tab by mouth daily. 07/27/17   [place] BellsPahle, Nancy J, MD   colchicine 0.6 mg tablet Take 1 Tab by mouth three (3) times daily. 07/26/17   [place] BellsPahle, Nancy J, MD   trimethoprim-sulfamethoxazole (BACTRIM DS, SEPTRA DS) 160-800 mg per tablet Take 1 Tab by mouth every twelve (12) hours. 07/26/17   [place] Bells[place]ahle, Nancy J, MD   insulin NPH (HUMULIN N NPH U-100 INSULIN) 100 unit/mL injection 32 Units by SubCUTAneous route nightly.    Provider, Historical   pantoprazole (PROTONIX) 40 mg tablet Take 1 Tab by mouth two (2) times a day. 07/14/17   Dorothyann PengMoss, Lisa Z, NP   metFORMIN ER (GLUCOPHAGE XR) 500 mg tablet TAKE 1 TABLET EVERY MORNING AND 2 TABLETS WITH DINNER 05/30/17   Pahle, Davonna BellingNancy J, MD   traMADol (ULTRAM) 50 mg tablet Take 1 Tab by mouth every eight (8) hours as needed for Pain. Max Daily Amount: 150 mg. 05/25/17   [place] BellsPahle, Nancy J, MD   sertraline (ZOLOFT) 100 mg tablet  TAKE 1 TABLET BY MOUTH DAILY 04/20/17   [place] Bells[place]ahle, Nancy J, MD   triamterene-hydroCHLOROthiazide [facility](MAXZIDE) 37.5-25 mg per tablet TAKE 1 TABLET DAILY 01/03/17   Cherly HensenPahle, Davonna BellingNancy J, MD     Allergies   Allergen Reactions   ??? Adhesive Itching   ??? Dolobid [Diflunisal] Itching     Itching,swelling        Review of Systems:  Pertinent items are noted in the History of Present Illness.     Objective:       Physical Exam:   Visit Vitals  BP 148/90 (BP 1 Location: Left arm, BP Patient Position: Sitting)   Pulse 100   Temp 97.5 ??F (36.4 ??C) (Oral)   Resp 18   Ht 5\' 4"  (1.626 m)   Wt 220 lb 8 oz (100 kg)   SpO2 94%   BMI 37.85 kg/m??     General appearance: alert, cooperative, mild distress, tearful  Lungs: clear to auscultation bilaterally  Heart: regular rate and rhythm, S1, S2 normal, no murmur, click, rub or gallop  Abdomen: soft, tender at pigtail site. Skin excoriation at pigtail insertion site. +BS.         Assessment:      61 year old female 8 week status post repair of gastric ulcer   Post op peri-gastric collection +/- fistula  Abdominal drain malpositioned.  DM    Plan:   Admit to Dr. Margaretmary LombardSchmidel   Labs  Ct with contrast today  Zosyn  NPO  IV Protonix BId   Continue drain for now and reinforcing dressing as needed  Further plan after imaging complete per Dr. Olivares/Dr. Amada JupiterShindel.   Discussed with Dr. Nedra HaiLee.       Signed By: Humphrey RollsErin R Terald Jump, NP     July 29, 2017

## 2017-07-29 NOTE — Progress Notes (Signed)
Problem: Impaired Skin Integrity/Pressure Injury Treatment  Goal: *Prevention of pressure injury  Document Braden Scale and appropriate interventions in the flowsheet.  Outcome: Progressing Towards Goal  Pressure Injury Interventions:       Moisture Interventions: Absorbent underpads, Apply protective barrier, creams and emollients, Contain wound drainage, Moisture barrier, Minimize layers, Maintain skin hydration (lotion/cream)    Activity Interventions: Increase time out of bed, Pressure redistribution bed/mattress(bed type)                              Problem: Falls - Risk of  Goal: *Absence of Falls  Document Schmid Fall Risk and appropriate interventions in the flowsheet.  Outcome: Progressing Towards Goal  Fall Risk Interventions:            Medication Interventions: Evaluate medications/consider consulting pharmacy, Patient to call before getting OOB, Teach patient to arise slowly

## 2017-07-29 NOTE — Progress Notes (Signed)
PCP cross cover consult dictated.     1. Intra abdominal abscess under tx by Gen sgy . For Sinus tract dye study tomorrow   Rocephin has been ordered by Gen Sgy     2. Type 2 DM. Will change from Regular Insulin to Humalog Insulin SS    3. Probable Left foot Gout. Better. Colchicine ordered tonight    4. HTN under tx with Norvasc and Triam/Hctz     5. Hx Perforated Gastric Ulcer sgy in September 2018. Agree Shawna Wearing/ IV Protonix     Thank you. Our group will follow

## 2017-07-29 NOTE — H&P (Signed)
??  ??  History and Physical  ??  Subjective:   ??  Dana [name] is a 61 y.o. status post Laparotomy exploratory perforated posterior ulcer repair over sew with patch on 05/12/2017. She was admitted on 07/02/2017 for abdominal pain and 07/17/2017 for peritoneal abscess.  All subsequent imaging reveals persistent perigastric collection +/- fistula. She was last discharged home on 07/26/2017. She presents today with complaints of pus draining from around her pigtail drain site. She states that the skin around the drain is red and irritated. Attempted to flush in office, saline leaked out of drain insertion site. Denies nausea or vomiting. Has tolerated diet at home. +Bm's  ??       Past Medical History:   Diagnosis Date   ??? Diabetes (HCC) 01/24/2013   ??? GERD (gastroesophageal reflux disease) ??   ??? Hives ??   ??? HTN (hypertension) ??   ??? Hypercholesterolemia ??   ??? Perforated gastric ulcer (HCC) ??            Past Surgical History:   Procedure Laterality Date   ??? ABDOMEN SURGERY PROC UNLISTED ?? 05/12/2017   ?? Lap exploratory perforated posterior ulcer repair by Dr. Amada JupiterShindel    ??? HX HYSTERECTOMY ?? ??   ??        Family History   Problem Relation Age of Onset   ??? Hypertension Mother ??   ??? Cancer Father ??   ??     mesothelioma   ??? Diabetes Father ??   ??? Hypertension Father ??      Social History   ??        Tobacco Use   ??? Smoking status: Former Smoker   ?? ?? Packs/day: 1.00   ?? ?? Last attempt to quit: 06/27/2011   ?? ?? Years since quitting: 6.0   ??? Smokeless tobacco: Never Used   Substance Use Topics   ??? Alcohol use: Yes   ?? ?? Alcohol/week: 0.6 - 1.2 oz   ?? ?? Types: 1 - 2 Glasses of wine per week   ?? ?? Comment: Per month                Prior to Admission medications    Medication Sig Start Date End Date Taking? Authorizing Provider   CARAFATE 100 mg/mL suspension TAKE 10 ML BY MOUTH FOUR (4) TIMES DAILY FOR 28 DAYS. INDICATIONS: GASTRIC ULCER 07/26/17 ?? ?? Carolynn SayersParker, George A, MD    HYDROmorphone (DILAUDID) 2 mg tablet Take 0.5 Tabs by mouth every four (4) hours as needed for Pain. Max Daily Amount: 6 mg. 07/26/17 ?? ?? Berneice Gandyogers, Mary S, NP   amLODIPine (NORVASC) 5 mg tablet Take 1 Tab by mouth daily. 07/27/17 ?? ?? Pahle, Davonna BellingNancy J, MD   colchicine 0.6 mg tablet Take 1 Tab by mouth three (3) times daily. 07/26/17 ?? ?? Pahle, Davonna BellingNancy J, MD   trimethoprim-sulfamethoxazole (BACTRIM DS, SEPTRA DS) 160-800 mg per tablet Take 1 Tab by mouth every twelve (12) hours. 07/26/17 ?? ?? Pahle, Davonna BellingNancy J, MD   insulin NPH (HUMULIN N NPH U-100 INSULIN) 100 unit/mL injection 32 Units by SubCUTAneous route nightly. ?? ?? ?? Provider, Historical   pantoprazole (PROTONIX) 40 mg tablet Take 1 Tab by mouth two (2) times a day. 07/14/17 ?? ?? Dorothyann PengMoss, Lisa Z, NP   metFORMIN ER (GLUCOPHAGE XR) 500 mg tablet TAKE 1 TABLET EVERY MORNING AND 2 TABLETS WITH DINNER 05/30/17 ?? ?? Pahle, Davonna BellingNancy J, MD   traMADol Janean Sark(ULTRAM)  50 mg tablet Take 1 Tab by mouth every eight (8) hours as needed for Pain. Max Daily Amount: 150 mg. 05/25/17 ?? ?? [place] Bells[place]le, Nancy J, MD   sertraline (ZOLOFT) 100 mg tablet TAKE 1 TABLET BY MOUTH DAILY 04/20/17 ?? ?? Pahle, Davonna BellingNancy J, MD   triamterene-hydroCHLOROthiazide (MAXZIDE) 37.5-25 mg per tablet TAKE 1 TABLET DAILY 01/03/17 ?? ?? Pahle, Davonna BellingNancy J, MD   ??        Allergies   Allergen Reactions   ??? Adhesive Itching   ??? Dolobid [Diflunisal] Itching   ?? ?? Itching,swelling      ??  Review of Systems:  Pertinent items are noted in the History of Present Illness.   ??  Objective:   ??  ??  Physical Exam:   Visit Vitals  BP 148/90 (BP 1 Location: Left arm, BP Patient Position: Sitting)   Pulse 100   Temp 97.5 ??F (36.4 ??C) (Oral)   Resp 18   Ht 5\' 4"  (1.626 m)   Wt 220 lb 8 oz (100 kg)   SpO2 94%   BMI 37.85 kg/m??   ??  General appearance: alert, cooperative, mild distress, tearful  Lungs: clear to auscultation bilaterally  Heart: regular rate and rhythm, S1, S2 normal, no murmur, click, rub or gallop   Abdomen: soft, tender at pigtail site. Skin excoriation at pigtail insertion site. +BS.   ??  ??  ??  Assessment:   ??  61 year old female 8 week status post repair of gastric ulcer   Post op peri-gastric collection +/- fistula  Abdominal drain malpositioned.  DM  ??  Plan:   Admit to Dr. Margaretmary LombardSchmidel   Labs  Ct with contrast today  Zosyn  NPO  IV Protonix BId   Continue drain for now and reinforcing dressing as needed  Further plan after imaging complete per Dr. Shatto/Dr. Amada JupiterShindel.   Discussed with Dr. Nedra HaiLee.   ??  ??  Signed By: Humphrey RollsErin R Yandriel Boening, NP    ?? July 29, 2017

## 2017-07-29 NOTE — Consults (Signed)
[place] [facility]  CONSULTATION    Name:[name], [name]  MR#: [id number]  DOB: 07/06/1956  ACCOUNT #: [id number]   DATE OF SERVICE: 07/29/2017    ATTENDING GENERAL SURGEON:  Dana FinlayStuart Shindel, MD  PCP: Dr. Fayrene FearingNancy [name], of our group    REASON FOR CONSULTATION:  I am asked to see this delightful 61 year old white female in medical consultation to assist with management of medical.    IMPRESSION:  1.  Intra-abdominal abscess.  Under treatment.  The patient has a pigtail drainage catheter in.  She is scheduled for a sinus tract dye study tomorrow to further evaluate the intra-abdominal abscess.  Rocephin is ordered by General Surgery.  2.  Type 2 diabetes mellitus.  At home, she takes NPH insulin 32 units at bedtime.  I have taken the liberty of changing her regular insulin sliding scale here in the hospital to Humalog sliding scale. Because she will soon be NPO, I agree with holding her NPH Insulin  3.  Recent probable left foot gout.  It is better.  Colchicine is ordered tonight.    4.  Hypertension.  Under treatment with Norvasc and triamterene/hydrochlorothiazide.  I agree with continuing them.  5.  History of 05/2017 surgery for a perforated gastric ulcer.  The patient has been ordered intravenous Protonix, which I agree with.    Thank you for this consultation.  Our group will follow with you.    CHIEF COMPLAINT:  A 61 year old white female admitted with drainage in and around a pigtail catheter site.    HISTORY OF PRESENT ILLNESS:  In 05/2017, the patient underwent exploratory laparotomy for perforated posterior gastric ulcer repair with oversew and patch.  She was then admitted 07/02/2017 for abdominal pain and on 07/17/2012 for a peritoneal abscess.  Her subsequent imaging reveals persistent perigastric collection plus/minus fistula.  She was last discharged home on 07/26/2017 on Bactrim and with local wound care.  She presented to the general surgery office today with complaints of pus  draining around her pigtail drain site and has noted  red and irritated skin around the drain.  In the office, an attempt was made to flush the catheter.  However, saline leaked out of a drain insertion site.   CT abdomen and pelvis today revealed a newly visualized 7 cm thin fluid collection extending caudally from the otherwise stable fluid gas collection of the lower abdomen posterior to the right rectus abdominus muscle.  As mentioned, the newly visualized thin fluid extension extends caudally 7 cm in length and 1 cm in axial dimension.  The fistula appearance from the collection to the stomach remains.  Mild marrow edema of the adjacent transverse colon is unchanged in comparison to a CT scan of 07/25/2017.  Patient was admitted to the general surgery floor and medical consultation was requested.  Of note, 07/18/2017 wound culture grew moderate E. coli, heavy Klebsiella pneumoniae, and moderate beta hemolytic group C streptococcus.  Both the E. coli and Klebsiella were sensitive to ceftriaxone and to trimethoprim sulfa.    PAST MEDICAL HISTORY:  Perforated gastric ulcer as mentioned, hypercholesterolemia, hypertension, urticaria, GERD, diabetes mellitus.    PAST SURGICAL HISTORY:  Hysterectomy; 05/2017, exploratory laparotomy with perforated posterior ulcer repair by Dana [name]; later, the patient has had pigtail catheter placement into the above-mentioned persistent fluid collection.    ALLERGIES:  ADHESIVE, DOLOBID (ITCHING, SWELLING).    HOME MEDICATIONS:  Tramadol 50 mg p.o. q.8 hours p.r.n., Carafate 100 mg per mL p.o. q.i.d.,  hydromorphone 1 mg p.o. q.4 hours p.r.n., amlodipine 5 mg daily, colchicine 0.6 mg p.o. b.i.d., Bactrim DS 1 q.12 hours, insulin NPH 32 units at bedtime, Protonix 40 mg p.o. b.i.d., metformin XR 500 mg q.a.m. and 1000 mg daily at dinner, sertraline 100 mg daily, triamterene/hydrochlorothiazide 1 tablet daily.    FAMILY HISTORY:  Mother, hypertension.  Father, cancer, diabetes,  hypertension.    SOCIAL HISTORY:  Former smoker.  Alcohol:  1-2 glasses of wine per month.    REVIEW OF SYSTEMS:  Patient has noted a new open area to the right of the pigtail catheter insertion site with drainage from the new site and from around the pigtail catheter.  She denies fever or chills.  She had some positional right abdominal discomfort, but denies abdominal pain.  She briefly had nausea earlier this evening, but denies it now.  Bowels have been regular, without diarrhea or rectal bleeding. Denies GU disease  As mentioned, she has had recent probable gout, which has responded to colchicine.    PHYSICAL EXAMINATION:  VITAL SIGNS:  Temperature 97.5, BP 138/84, respiration 18, O2 sat 96% on room air.  HEENT:  Negative.  NECK:  No carotid bruits.  LUNGS:  Clear.  HEART:  Regular, without murmur, gallop, or rub.  ABDOMEN:  Bowel sounds positive.  In the right mid abdomen is an approximately 4 x 4 cm area of erythema and induration surrounding the pigtail catheter insertion site.  To the right of it is an approximately 3 mm open area.  From both the 3 mm diameter area and the pigtail catheter insertion site, purulence is expressible with positive tenderness.    EXTREMITIES:  Left foot is nontender, without synovitis.  Feet 2+ DP pulses.  Mildly dry skin.  Light touch sensation intact in her feet.  NEUROLOGIC:  Grossly nonfocal.    LABORATORY DATA:  CT scan as mentioned per HPI.  Glucose at 1329 was 132, at 1640 was 97.  Sodium 132, AST 40, slightly elevated; ALT normal.  Total protein 8.3, albumin 2.6, globulin 5.7.  Serum WBC 9.4, hemoglobin 12.7, hematocrit 38.8, platelet count 334.     Thank you very much for asking me to see the patient.  Our group will follow along with you.      Aggie CosierWALTER CLIFFORD HENDRIX, MD       [facility]  D: 07/29/2017 20:29     T: 07/29/2017 20:57  JOB #: [id number]  CC: Dana FinlaySTUART SHINDEL MD

## 2017-07-29 NOTE — Telephone Encounter (Signed)
Patient identified with two patient identifiers.  Patient informed she can come to the office now to be seen by NP on call. Patient in agreement will head to the office shortly.

## 2017-07-29 NOTE — Consults (Signed)
[place] [facility]  CONSULTATION    Name:[name], [name]  MR#: [id number]  DOB: 1955-10-16  ACCOUNT #: [id number]   DATE OF SERVICE: 07/29/2017    ATTENDING GENERAL SURGEON:  Dana FinlayStuart Shindel, MD  PCP: Dr. Fayrene FearingNancy [name], of our group    REASON FOR CONSULTATION:  I am asked to see this delightful 61 year old white female in medical consultation to assist with management of medical.    IMPRESSION:  1.  Intra-abdominal abscess.  Under treatment.  The patient has a pigtail drainage catheter in.  She is scheduled for a sinus tract dye study tomorrow to further evaluate the intra-abdominal abscess.  Rocephin is ordered by General Surgery.  2.  Type 2 diabetes mellitus.  At home, she takes NPH insulin 32 units at bedtime.  I have taken the liberty of changing her regular insulin sliding scale here in the hospital to Humalog sliding scale. Because she will soon be NPO, I agree with holding her NPH Insulin  3.  Recent probable left foot gout.  It is better.  Colchicine is ordered tonight.    4.  Hypertension.  Under treatment with Norvasc and triamterene/hydrochlorothiazide.  I agree with continuing them.  5.  History of 05/2017 surgery for a perforated gastric ulcer.  The patient has been ordered intravenous Protonix, which I agree with.    Thank you for this consultation.  Our group will follow with you.    CHIEF COMPLAINT:  A 61 year old white female admitted with drainage in and around a pigtail catheter site.    HISTORY OF PRESENT ILLNESS:  In 05/2017, the patient underwent exploratory laparotomy for perforated posterior gastric ulcer repair with oversew and patch.  She was then admitted 07/02/2017 for abdominal pain and on 07/17/2012 for a peritoneal abscess.  Her subsequent imaging reveals persistent perigastric collection plus/minus fistula.  She was last discharged home on 07/26/2017 on Bactrim and with local wound care.  She presented to the general surgery office today with complaints of pus draining  around her pigtail drain site and has noted  red and irritated skin around the drain.  In the office, an attempt was made to flush the catheter.  However, saline leaked out of a drain insertion site.   CT abdomen and pelvis today revealed a newly visualized 7 cm thin fluid collection extending caudally from the otherwise stable fluid gas collection of the lower abdomen posterior to the right rectus abdominus muscle.  As mentioned, the newly visualized thin fluid extension extends caudally 7 cm in length and 1 cm in axial dimension.  The fistula appearance from the collection to the stomach remains.  Mild marrow edema of the adjacent transverse colon is unchanged in comparison to a CT scan of 07/25/2017.  Patient was admitted to the general surgery floor and medical consultation was requested.  Of note, 07/18/2017 wound culture grew moderate E. coli, heavy Klebsiella pneumoniae, and moderate beta hemolytic group C streptococcus.  Both the E. coli and Klebsiella were sensitive to ceftriaxone and to trimethoprim sulfa.    PAST MEDICAL HISTORY:  Perforated gastric ulcer as mentioned, hypercholesterolemia, hypertension, urticaria, GERD, diabetes mellitus.    PAST SURGICAL HISTORY:  Hysterectomy; 05/2017, exploratory laparotomy with perforated posterior ulcer repair by Dana [name]; later, the patient has had pigtail catheter placement into the above-mentioned persistent fluid collection.    ALLERGIES:  ADHESIVE, DOLOBID (ITCHING, SWELLING).    HOME MEDICATIONS:  Tramadol 50 mg p.o. q.8 hours p.r.n., Carafate 100 mg per mL p.o. q.i.d.,  hydromorphone 1 mg p.o. q.4 hours p.r.n., amlodipine 5 mg daily, colchicine 0.6 mg p.o. b.i.d., Bactrim DS 1 q.12 hours, insulin NPH 32 units at bedtime, Protonix 40 mg p.o. b.i.d., metformin XR 500 mg q.a.m. and 1000 mg daily at dinner, sertraline 100 mg daily, triamterene/hydrochlorothiazide 1 tablet daily.    FAMILY HISTORY:  Mother, hypertension.  Father, cancer, diabetes,  hypertension.    SOCIAL HISTORY:  Former smoker.  Alcohol:  1-2 glasses of wine per month.    REVIEW OF SYSTEMS:  Patient has noted a new open area to the right of the pigtail catheter insertion site with drainage from the new site and from around the pigtail catheter.  She denies fever or chills.  She had some positional right abdominal discomfort, but denies abdominal pain.  She briefly had nausea earlier this evening, but denies it now.  Bowels have been regular, without diarrhea or rectal bleeding. Denies GU disease  As mentioned, she has had recent probable gout, which has responded to colchicine.    PHYSICAL EXAMINATION:  VITAL SIGNS:  Temperature 97.5, BP 138/84, respiration 18, O2 sat 96% on room air.  HEENT:  Negative.  NECK:  No carotid bruits.  LUNGS:  Clear.  HEART:  Regular, without murmur, gallop, or rub.  ABDOMEN:  Bowel sounds positive.  In the right mid abdomen is an approximately 4 x 4 cm area of erythema and induration surrounding the pigtail catheter insertion site.  To the right of it is an approximately 3 mm open area.  From both the 3 mm diameter area and the pigtail catheter insertion site, purulence is expressible with positive tenderness.    EXTREMITIES:  Left foot is nontender, without synovitis.  Feet 2+ DP pulses.  Mildly dry skin.  Light touch sensation intact in her feet.  NEUROLOGIC:  Grossly nonfocal.    LABORATORY DATA:  CT scan as mentioned per HPI.  Glucose at 1329 was 132, at 1640 was 97.  Sodium 132, AST 40, slightly elevated; ALT normal.  Total protein 8.3, albumin 2.6, globulin 5.7.  Serum WBC 9.4, hemoglobin 12.7, hematocrit 38.8, platelet count 334.     Thank you very much for asking me to see the patient.  Our group will follow along with you.      Dana CosierWALTER CLIFFORD HENDRIX, MD       [facility]  D: 07/29/2017 20:29     T: 07/29/2017 20:57  JOB #: [id number]  CC: Dana FinlaySTUART SHINDEL MD

## 2017-07-30 ENCOUNTER — Inpatient Hospital Stay: Admit: 2017-07-30 | Payer: BLUE CROSS/BLUE SHIELD | Primary: Internal Medicine

## 2017-07-30 ENCOUNTER — Inpatient Hospital Stay: Payer: BLUE CROSS/BLUE SHIELD | Primary: Internal Medicine

## 2017-07-30 LAB — CBC WITH AUTOMATED DIFF
ABS. BASOPHILS: 0 10*3/uL (ref 0.0–0.1)
ABS. EOSINOPHILS: 0.1 10*3/uL (ref 0.0–0.4)
ABS. IMM. GRANS.: 0.1 10*3/uL — ABNORMAL HIGH (ref 0.00–0.04)
ABS. LYMPHOCYTES: 1.9 10*3/uL (ref 0.8–3.5)
ABS. MONOCYTES: 0.7 10*3/uL (ref 0.0–1.0)
ABS. NEUTROPHILS: 3.7 10*3/uL (ref 1.8–8.0)
ABSOLUTE NRBC: 0 10*3/uL (ref 0.00–0.01)
BASOPHILS: 1 % (ref 0–1)
EOSINOPHILS: 1 % (ref 0–7)
HCT: 36.2 % (ref 35.0–47.0)
HGB: 11.8 g/dL (ref 11.5–16.0)
IMMATURE GRANULOCYTES: 1 % — ABNORMAL HIGH (ref 0.0–0.5)
LYMPHOCYTES: 29 % (ref 12–49)
MCH: 28.6 PG (ref 26.0–34.0)
MCHC: 32.6 g/dL (ref 30.0–36.5)
MCV: 87.7 FL (ref 80.0–99.0)
MONOCYTES: 11 % (ref 5–13)
MPV: 9.8 FL (ref 8.9–12.9)
NEUTROPHILS: 57 % (ref 32–75)
NRBC: 0 PER 100 WBC
PLATELET: 300 10*3/uL (ref 150–400)
RBC: 4.13 M/uL (ref 3.80–5.20)
RDW: 14.3 % (ref 11.5–14.5)
WBC: 6.5 10*3/uL (ref 3.6–11.0)

## 2017-07-30 LAB — METABOLIC PANEL, COMPREHENSIVE
A-G Ratio: 0.5 — ABNORMAL LOW (ref 1.1–2.2)
ALT (SGPT): 21 U/L (ref 12–78)
AST (SGOT): 11 U/L — ABNORMAL LOW (ref 15–37)
Albumin: 2.4 g/dL — ABNORMAL LOW (ref 3.5–5.0)
Alk. phosphatase: 59 U/L (ref 45–117)
Anion gap: 8 mmol/L (ref 5–15)
BUN/Creatinine ratio: 16 (ref 12–20)
BUN: 10 MG/DL (ref 6–20)
Bilirubin, total: 0.4 MG/DL (ref 0.2–1.0)
CO2: 28 mmol/L (ref 21–32)
Calcium: 8.6 MG/DL (ref 8.5–10.1)
Chloride: 101 mmol/L (ref 97–108)
Creatinine: 0.63 MG/DL (ref 0.55–1.02)
GFR est AA: >60 mL/min/{1.73_m2} (ref >60)
GFR est non-AA: >60 mL/min/{1.73_m2} (ref >60)
Globulin: 4.9 g/dL — ABNORMAL HIGH (ref 2.0–4.0)
Glucose: 124 mg/dL — ABNORMAL HIGH (ref 65–100)
Potassium: 2.9 mmol/L — ABNORMAL LOW (ref 3.5–5.1)
Protein, total: 7.3 g/dL (ref 6.4–8.2)
Sodium: 137 mmol/L (ref 136–145)

## 2017-07-30 LAB — GLUCOSE, POC
Glucose (POC): 138 mg/dL — ABNORMAL HIGH (ref 65–100)
Glucose (POC): 175 mg/dL — ABNORMAL HIGH (ref 65–100)
Glucose (POC): 126 mg/dL — ABNORMAL HIGH (ref 65–100)
Glucose (POC): 106 mg/dL — ABNORMAL HIGH (ref 65–100)

## 2017-07-30 LAB — MAGNESIUM: Magnesium: 1.8 mg/dL (ref 1.6–2.4)

## 2017-07-30 MED ORDER — MIDAZOLAM 1 MG/ML IJ SOLN
1 mg/mL | INTRAMUSCULAR | Status: DC
Start: 2017-07-30 — End: 2017-07-30
  Administered 2017-07-30: 16:00:00

## 2017-07-30 MED ORDER — MIDAZOLAM 1 MG/ML IJ SOLN
1 mg/mL | INTRAMUSCULAR | Status: DC | PRN
Start: 2017-07-30 — End: 2017-07-30
  Administered 2017-07-30 (×5): via INTRAVENOUS

## 2017-07-30 MED ORDER — SODIUM CHLORIDE 0.9 % IV
INTRAVENOUS | Status: DC
Start: 2017-07-30 — End: 2017-07-30
  Administered 2017-07-30: 16:00:00 via INTRAVENOUS

## 2017-07-30 MED ORDER — LIDOCAINE (PF) 10 MG/ML (1 %) IJ SOLN
10 mg/mL (1 %) | INTRAMUSCULAR | Status: DC
Start: 2017-07-30 — End: 2017-07-30
  Administered 2017-07-30: 16:00:00

## 2017-07-30 MED ORDER — SODIUM CHLORIDE 0.9 % IJ SYRG
INTRAMUSCULAR | Status: DC | PRN
Start: 2017-07-30 — End: 2017-07-30

## 2017-07-30 MED ORDER — IOPAMIDOL 61 % IV SOLN
300 mg iodine /mL (61 %) | INTRAVENOUS | Status: DC
Start: 2017-07-30 — End: 2017-07-30
  Administered 2017-07-30: 16:00:00

## 2017-07-30 MED ORDER — POTASSIUM CHLORIDE SR 10 MEQ TAB
10 mEq | Freq: Two times a day (BID) | ORAL | Status: AC
Start: 2017-07-30 — End: 2017-07-31
  Administered 2017-07-30: 23:00:00 via ORAL

## 2017-07-30 MED ORDER — LIDOCAINE (PF) 10 MG/ML (1 %) IJ SOLN
10 mg/mL (1 %) | Freq: Once | INTRAMUSCULAR | Status: AC
Start: 2017-07-30 — End: 2017-07-30
  Administered 2017-07-30: 16:00:00 via SUBCUTANEOUS

## 2017-07-30 MED ORDER — MIDAZOLAM 1 MG/ML IJ SOLN
1 mg/mL | INTRAMUSCULAR | Status: DC | PRN
Start: 2017-07-30 — End: 2017-07-30

## 2017-07-30 MED ORDER — COLCHICINE 0.6 MG TAB
0.6 mg | Freq: Once | ORAL | Status: AC
Start: 2017-07-30 — End: 2017-07-29
  Administered 2017-07-30: 02:00:00 via ORAL

## 2017-07-30 MED ORDER — METRONIDAZOLE IN SODIUM CHLORIDE (ISO-OSM) 500 MG/100 ML IV PIGGY BACK
500 mg/100 mL | Freq: Two times a day (BID) | INTRAVENOUS | Status: DC
Start: 2017-07-30 — End: 2017-08-03
  Administered 2017-07-30 – 2017-08-03 (×9): via INTRAVENOUS

## 2017-07-30 MED ORDER — POTASSIUM CHLORIDE 10 MEQ/100 ML IV PIGGY BACK
10 mEq/0 mL | INTRAVENOUS | Status: AC
Start: 2017-07-30 — End: 2017-07-30
  Administered 2017-07-30 (×4): via INTRAVENOUS

## 2017-07-30 MED ORDER — INSULIN LISPRO 100 UNIT/ML INJECTION
100 unit/mL | Freq: Four times a day (QID) | SUBCUTANEOUS | Status: DC
Start: 2017-07-30 — End: 2017-08-11
  Administered 2017-08-02 – 2017-08-11 (×12): via SUBCUTANEOUS

## 2017-07-30 MED ORDER — IOPAMIDOL 61 % IV SOLN
300 mg iodine /mL (61 %) | Freq: Once | INTRAVENOUS | Status: AC
Start: 2017-07-30 — End: 2017-07-30
  Administered 2017-07-30: 17:00:00

## 2017-07-30 MED ORDER — METRONIDAZOLE IN SODIUM CHLORIDE (ISO-OSM) 500 MG/100 ML IV PIGGY BACK
500 mg/100 mL | Freq: Three times a day (TID) | INTRAVENOUS | Status: DC
Start: 2017-07-30 — End: 2017-07-30

## 2017-07-30 MED ORDER — FENTANYL CITRATE (PF) 50 MCG/ML IJ SOLN
50 mcg/mL | INTRAMUSCULAR | Status: DC | PRN
Start: 2017-07-30 — End: 2017-07-30
  Administered 2017-07-30 (×3): via INTRAVENOUS

## 2017-07-30 MED ORDER — ZOLPIDEM 5 MG TAB
5 mg | Freq: Every evening | ORAL | Status: DC
Start: 2017-07-30 — End: 2017-08-11
  Administered 2017-07-30 – 2017-08-11 (×13): via ORAL

## 2017-07-30 MED FILL — SERTRALINE 50 MG TAB: 50 mg | ORAL | Qty: 2

## 2017-07-30 MED FILL — XYLOCAINE-MPF 10 MG/ML (1 %) INJECTION SOLUTION: 10 mg/mL (1 %) | INTRAMUSCULAR | Qty: 5

## 2017-07-30 MED FILL — NORMAL SALINE FLUSH 0.9 % INJECTION SYRINGE: INTRAMUSCULAR | Qty: 10

## 2017-07-30 MED FILL — ISOVUE-300  61 % INTRAVENOUS SOLUTION: 300 mg iodine /mL (61 %) | INTRAVENOUS | Qty: 100

## 2017-07-30 MED FILL — POTASSIUM CHLORIDE 10 MEQ/100 ML IV PIGGY BACK: 10 mEq/0 mL | INTRAVENOUS | Qty: 100

## 2017-07-30 MED FILL — CEFTRIAXONE 1 GRAM SOLUTION FOR INJECTION: 1 gram | INTRAMUSCULAR | Qty: 1

## 2017-07-30 MED FILL — LORAZEPAM 1 MG TAB: 1 mg | ORAL | Qty: 1

## 2017-07-30 MED FILL — ZOLPIDEM 5 MG TAB: 5 mg | ORAL | Qty: 1

## 2017-07-30 MED FILL — PROTONIX 40 MG INTRAVENOUS SOLUTION: 40 mg | INTRAVENOUS | Qty: 40

## 2017-07-30 MED FILL — MIDAZOLAM 1 MG/ML IJ SOLN: 1 mg/mL | INTRAMUSCULAR | Qty: 5

## 2017-07-30 MED FILL — ACETAMINOPHEN 325 MG TABLET: 325 mg | ORAL | Qty: 2

## 2017-07-30 MED FILL — COLCHICINE 0.6 MG TAB: 0.6 mg | ORAL | Qty: 1

## 2017-07-30 MED FILL — AMLODIPINE 5 MG TAB: 5 mg | ORAL | Qty: 1

## 2017-07-30 MED FILL — FENTANYL CITRATE (PF) 50 MCG/ML IJ SOLN: 50 mcg/mL | INTRAMUSCULAR | Qty: 4

## 2017-07-30 MED FILL — K-TAB 10 MEQ TABLET,EXTENDED RELEASE: 10 mEq | ORAL | Qty: 4

## 2017-07-30 MED FILL — METRO I.V. 500 MG/100 ML INTRAVENOUS PIGGYBACK: 500 mg/100 mL | INTRAVENOUS | Qty: 100

## 2017-07-30 MED FILL — TRIAMTERENE-HYDROCHLOROTHIAZIDE 37.5 MG-25 MG TAB: ORAL | Qty: 1

## 2017-07-30 NOTE — Progress Notes (Signed)
Progress Note    Patient: Dana [name] MRN: [id number]  SSN: [id number]    Date of Birth: 1956-08-22  Age: 61 y.o.  Sex: female      Admit Date: 07/29/2017    * No surgery found *    Procedure:      Subjective:     Patient without new complaints.  Had IR tube exchange today - During the IR procedure the colon was entered with the  Drain. There was no connection to the stomach.     Objective:     Visit Vitals  BP 124/82 (BP 1 Location: Right arm, BP Patient Position: At rest)   Pulse 80   Temp 97.9 ??F (36.6 ??C)   Resp 16   SpO2 97%   Breastfeeding? No       Temp (24hrs), Avg:98.1 ??F (36.7 ??C), Min:97.5 ??F (36.4 ??C), Max:98.3 ??F (36.8 ??C)      Physical Exam:    Gen- Alert in NAD- Tearful  Lungs- CTA  H-RRR  Abd- S/nt/nd JP serosanginous.     Data Review: images and reports reviewed  IR catheter placed into colon during exchange.   Lab Review:   All lab results for the last 24 hours reviewed.  Recent Results (from the past 24 hour(s))   CBC W/O DIFF    Collection Time: 07/29/17  1:29 PM   Result Value Ref Range    WBC 9.4 3.6 - 11.0 K/uL    RBC 4.41 3.80 - 5.20 M/uL    HGB 12.7 11.5 - 16.0 g/dL    HCT 38.8 35.0 - 47.0 %    MCV 88.0 80.0 - 99.0 FL    MCH 28.8 26.0 - 34.0 PG    MCHC 32.7 30.0 - 36.5 g/dL    RDW 14.5 11.5 - 14.5 %    PLATELET 334 150 - 400 K/uL    MPV 10.3 8.9 - 12.9 FL    NRBC 0.0 0 PER 100 WBC    ABSOLUTE NRBC 0.00 0.00 - 7.61 K/uL   METABOLIC PANEL, COMPREHENSIVE    Collection Time: 07/29/17  1:29 PM   Result Value Ref Range    Sodium 132 (L) 136 - 145 mmol/L    Potassium 4.4 3.5 - 5.1 mmol/L    Chloride 95 (L) 97 - 108 mmol/L    CO2 27 21 - 32 mmol/L    Anion gap 10 5 - 15 mmol/L    Glucose 132 (H) 65 - 100 mg/dL    BUN 14 6 - 20 MG/DL    Creatinine 0.94 0.55 - 1.02 MG/DL    BUN/Creatinine ratio 15 12 - 20      GFR est AA >60 >60 ml/min/1.74m    GFR est non-AA >60 >60 ml/min/1.7100m   Calcium 9.2 8.5 - 10.1 MG/DL    Bilirubin, total 0.6 0.2 - 1.0 MG/DL    ALT (SGPT) 26 12 - 78 U/L     AST (SGOT) 40 (H) 15 - 37 U/L    Alk. phosphatase 68 45 - 117 U/L    Protein, total 8.3 (H) 6.4 - 8.2 g/dL    Albumin 2.6 (L) 3.5 - 5.0 g/dL    Globulin 5.7 (H) 2.0 - 4.0 g/dL    A-G Ratio 0.5 (L) 1.1 - 2.2     GLUCOSE, POC    Collection Time: 07/29/17  1:40 PM   Result Value Ref Range    Glucose (POC) 137 (H) 65 - 100 mg/dL  Performed by Romilda Garret NAYGIL    GLUCOSE, POC    Collection Time: 07/29/17  4:40 PM   Result Value Ref Range    Glucose (POC) 97 65 - 100 mg/dL    Performed by Johney Frame    GLUCOSE, POC    Collection Time: 07/29/17  9:59 PM   Result Value Ref Range    Glucose (POC) 138 (H) 65 - 100 mg/dL    Performed by Edmon Crape    CBC WITH AUTOMATED DIFF    Collection Time: 07/30/17  2:50 AM   Result Value Ref Range    WBC 6.5 3.6 - 11.0 K/uL    RBC 4.13 3.80 - 5.20 M/uL    HGB 11.8 11.5 - 16.0 g/dL    HCT 36.2 35.0 - 47.0 %    MCV 87.7 80.0 - 99.0 FL    MCH 28.6 26.0 - 34.0 PG    MCHC 32.6 30.0 - 36.5 g/dL    RDW 14.3 11.5 - 14.5 %    PLATELET 300 150 - 400 K/uL    MPV 9.8 8.9 - 12.9 FL    NRBC 0.0 0 PER 100 WBC    ABSOLUTE NRBC 0.00 0.00 - 0.01 K/uL    NEUTROPHILS 57 32 - 75 %    LYMPHOCYTES 29 12 - 49 %    MONOCYTES 11 5 - 13 %    EOSINOPHILS 1 0 - 7 %    BASOPHILS 1 0 - 1 %    IMMATURE GRANULOCYTES 1 (H) 0.0 - 0.5 %    ABS. NEUTROPHILS 3.7 1.8 - 8.0 K/UL    ABS. LYMPHOCYTES 1.9 0.8 - 3.5 K/UL    ABS. MONOCYTES 0.7 0.0 - 1.0 K/UL    ABS. EOSINOPHILS 0.1 0.0 - 0.4 K/UL    ABS. BASOPHILS 0.0 0.0 - 0.1 K/UL    ABS. IMM. GRANS. 0.1 (H) 0.00 - 0.04 K/UL    DF AUTOMATED     METABOLIC PANEL, COMPREHENSIVE    Collection Time: 07/30/17  2:50 AM   Result Value Ref Range    Sodium 137 136 - 145 mmol/L    Potassium 2.9 (L) 3.5 - 5.1 mmol/L    Chloride 101 97 - 108 mmol/L    CO2 28 21 - 32 mmol/L    Anion gap 8 5 - 15 mmol/L    Glucose 124 (H) 65 - 100 mg/dL    BUN 10 6 - 20 MG/DL    Creatinine 0.63 0.55 - 1.02 MG/DL    BUN/Creatinine ratio 16 12 - 20      GFR est AA >60 >60 ml/min/1.4m     GFR est non-AA >60 >60 ml/min/1.728m   Calcium 8.6 8.5 - 10.1 MG/DL    Bilirubin, total 0.4 0.2 - 1.0 MG/DL    ALT (SGPT) 21 12 - 78 U/L    AST (SGOT) 11 (L) 15 - 37 U/L    Alk. phosphatase 59 45 - 117 U/L    Protein, total 7.3 6.4 - 8.2 g/dL    Albumin 2.4 (L) 3.5 - 5.0 g/dL    Globulin 4.9 (H) 2.0 - 4.0 g/dL    A-G Ratio 0.5 (L) 1.1 - 2.2     MAGNESIUM    Collection Time: 07/30/17  2:50 AM   Result Value Ref Range    Magnesium 1.8 1.6 - 2.4 mg/dL   GLUCOSE, POC    Collection Time: 07/30/17  6:24 AM   Result Value Ref Range  Glucose (POC) 175 (H) 65 - 100 mg/dL    Performed by Edmon Crape    GLUCOSE, POC    Collection Time: 07/30/17 12:31 PM   Result Value Ref Range    Glucose (POC) 126 (H) 65 - 100 mg/dL    Performed by Oliver Barre        Assessment:     Hospital Problems  Date Reviewed: 07/29/17          Codes Class Noted POA    Intra-abdominal abscess [facility]) ICD-10-CM: K65.1  ICD-9-CM: 567.22  07/29/2017 Unknown        Hx of gastric ulcer ICD-10-CM: Z87.19  ICD-9-CM: V12.79  07/29/2017 Unknown              Plan/Recommendations/Medical Decision Making:   Reviewed IR findings with the  Patient. She is quite upset and tearful about this. I explained to her that she will need to be on Bowel rest for now to hope that the colon perforation will seal. The drain is at the site of the perforation  Will add Flagyl to current abx.   Npo for the now  I have asked patient advocacy to see the patient.     Signed By: Merrily Brittle, MD     July 30, 2017

## 2017-07-30 NOTE — Other (Signed)
TRANSFER - OUT REPORT:    Verbal report given to Albin FellingCarla (name) on Neva SeatRebecca D Mato  being transferred to 518(unit) for routine progression of care       Report consisted of patient???s Situation, Background, Assessment and   Recommendations(SBAR).     Information from the following report(s) Procedure Summary and MAR was reviewed with the receiving nurse.    Lines:   Peripheral IV 07/29/17 Anterior;Left Antecubital (Active)   Site Assessment Clean, dry, & intact 07/29/2017  9:48 PM   Phlebitis Assessment 0 07/29/2017  9:48 PM   Infiltration Assessment 0 07/29/2017  9:48 PM   Dressing Status Clean, dry, & intact 07/29/2017  9:48 PM   Dressing Type Trach dressing 07/29/2017  9:48 PM   Hub Color/Line Status Blue;Flushed;End cap changed 07/29/2017 12:12 PM   Action Taken Open ports on tubing capped 07/29/2017 12:12 PM   Alcohol Cap Used Yes 07/29/2017 12:12 PM        Opportunity for questions and clarification was provided, post drain change I IR.    Patient transported with:   O2 @ RA liters

## 2017-07-30 NOTE — Progress Notes (Signed)
Bedside shift change report given to Karla (oncoming nurse) by Amber (offgoing nurse). Report included the following information SBAR, Kardex, Intake/Output, MAR and Recent Results.

## 2017-07-30 NOTE — Progress Notes (Signed)
TRANSFER - IN REPORT:    Verbal report received from Jewel(name) on Dana [name]  being received from IR(unit) for ordered procedure      Report consisted of patient???s Situation, Background, Assessment and   Recommendations(SBAR).     Information from the following report(s) Procedure Summary was reviewed with the receiving nurse.    Opportunity for questions and clarification was provided.      Assessment completed upon patient???s arrival to unit and care assumed.

## 2017-07-30 NOTE — Progress Notes (Signed)
Patient potassium level 2.9. Notified MD. MD telephone order for four runs of IV potassium chloride10 meq. Will continue to monitor pt.

## 2017-07-30 NOTE — Consults (Signed)
[place] - [facility]ITAL[facility]y J. Dagmar Haithanjan, M.D.  [phone]               GASTROENTEROLOGY PROGRESS NOTE      NAME: Dana [name]   DOB:  July 08, 1956   MRN:  [id number]       Subjective:   Consulted for possible gastric fistula. Pt has had multiple admissions since surgery for ulcer with persistent drainage.       Objective:   VITALS:   Last 24hrs VS reviewed. Most recent are:  Visit Vitals  BP 138/84 (BP 1 Location: Right arm, BP Patient Position: At rest)   Pulse 82   Temp 98.3 ??F (36.8 ??C)   Resp 18   SpO2 94%   Breastfeeding? No       Intake/Output Summary (Last 24 hours) at 07/30/2017 1035  Last data filed at 07/29/2017 1914  Gross per 24 hour   Intake 480 ml   Output ???   Net 480 ml       PHYSICAL EXAM:  General: Alert, in no acute distress    Lungs:            CTA Bilaterally  Heart:  Normal S1, S2    Abdomen: Soft, Non distended, tender near drain site.  Normoactive bowel sounds, no HSM,   no rebound/guarding  MSK:   Normal muscle tone  Skin:   Warm to touch  Extremities: Negative bilateral pedal edema   Psych:   Good insight. Not anxious nor agitated.    Lab Data Reviewed:   Recent Labs     07/30/17  0250 07/29/17  1329   WBC 6.5 9.4   HGB 11.8 12.7   HCT 36.2 38.8   PLT 300 334     Recent Labs     07/30/17  0250 07/29/17  1329   NA 137 132*   K 2.9* 4.4   CL 101 95*   CO2 28 27   BUN 10 14   CREA 0.63 0.94   GLU 124* 132*   CA 8.6 9.2   MG 1.8  --      Recent Labs     07/30/17  0250 07/29/17  1329   SGOT 11* 40*   AP 59 68   TP 7.3 8.3*   ALB 2.4* 2.6*   GLOB 4.9* 5.7*     No results for input(s): INR, PTP, APTT in the last 72 hours.    No lab exists for component: INREXT   No results for input(s): FE, TIBC, PSAT, FERR in the last 72 hours.  No results for input(s): CPK, CKMB in the last 72 hours.    No lab exists for component: TOPONINI    See Electronic Medical Record for all procedure/radiology reports and details which were not copied into this note but were reviewed prior to  the creation of the Plan.    Assessment:   ?? Acute gastric ulcer with perforation: Abdominal drain in place without resolution. Possible fistula?  ?? OSA   ?? Morbid obesity  ?? Hypokalemia: pt with significant pain with KCl runs. Pt also receiving oral. Stopped KCl run and notified RN who will speak to Dr. Amada JupiterShindel the admitting attending.     Patient Active Problem List   Diagnosis Code   ??? Hypercholesterolemia E78.00   ??? Diabetes (HCC) E11.9   ??? Sleep apnea G47.30   ??? Hypertension complicating diabetes (HCC) E11.59, I10   ??? Morbid obesity due to excess  calories (HCC) E66.01   ??? Hives L50.9   ??? Acute gastric ulcer with perforation (HCC) K25.1   ??? Arthralgia of both knees M25.561, M25.562   ??? Abdominal pain R10.9   ??? Peritoneal abscess (HCC) K65.1   ??? Intra-abdominal abscess (HCC) K65.1   ??? Hx of gastric ulcer Z87.19       Plan:   ?? Replete K  ?? Await fistula study  ?? If fistula, would speak to my colleague regarding the role of [place] clip  ?? Discussed with Dr. Amada JupiterShindel       Signed by: Dagoberto ReefAndy J Ankur Snowdon, MD         07/30/2017  10:35 AM

## 2017-07-30 NOTE — Progress Notes (Signed)
Medical Progress Note      NAME: Dana [name]   DOB:  [birth year]  MRM:  [id number]    Date/Time: 07/30/2017      Problem List:   Active Problems:    Intra-abdominal abscess ([place]) (07/29/2017)      Hx of gastric ulcer (07/29/2017)           Subjective:     Abdomen feels the same.   Arm burning sensation earlier with IV KCl   K+2.9 this am  Left foot trace tenderness about same     Past Medical History:   Diagnosis Date   ??? Diabetes ([place]) 01/24/2013   ??? GERD (gastroesophageal reflux disease)    ??? Hives    ??? HTN (hypertension)    ??? Hypercholesterolemia    ??? Perforated gastric ulcer ([place])             Objective:         Vitals:      Last 24hrs VS reviewed since prior progress note. Most recent are:    Visit Vitals  BP (!) 145/92 (BP 1 Location: Right arm, BP Patient Position: At rest;Head of bed elevated (Comment degrees))   Pulse 79   Temp 98.1 ??F (36.7 ??C)   Resp 18   SpO2 95%   Breastfeeding? No     SpO2 Readings from Last 6 Encounters:   07/30/17 95%   07/29/17 94%   07/26/17 96%   07/14/17 96%   06/09/17 98%   05/23/17 98%            Intake/Output Summary (Last 24 hours) at 07/30/2017 0744  Last data filed at 07/29/2017 1914  Gross per 24 hour   Intake 480 ml   Output ???   Net 480 ml                  Exam:      General:  Alert, cooperative, no distress, appears stated age.   Lungs:   Clear to auscultation bilaterally.   Heart:  Regular rate and rhythm, S1, S2 normal, no murmur, click, rub or gallop.   Abdomen:   Soft, dressing in place . Unchanged Minor tenderness over RUQ and over dressing   Bowel sounds normal. No masses,  No organomegaly.   Extremities: Left arm IV site appears WNL.   Left foot nt ; no synovitis. no edema.     Lab Data Reviewed: (see below)  Recent Results (from the past 24 hour(s))   CBC Brooks Kinnan/O DIFF    Collection Time: 07/29/17  1:29 PM   Result Value Ref Range    WBC 9.4 3.6 - 11.0 K/uL    RBC 4.41 3.80 - 5.20 M/uL    HGB 12.7 11.5 - 16.0 g/dL    HCT 38.8 35.0 - 47.0 %    MCV 88.0 80.0 - 99.0 FL     MCH 28.8 26.0 - 34.0 PG    MCHC 32.7 30.0 - 36.5 g/dL    RDW 14.5 11.5 - 14.5 %    PLATELET 334 150 - 400 K/uL    MPV 10.3 8.9 - 12.9 FL    NRBC 0.0 0 PER 100 WBC    ABSOLUTE NRBC 0.00 0.00 - 6.96 K/uL   METABOLIC PANEL, COMPREHENSIVE    Collection Time: 07/29/17  1:29 PM   Result Value Ref Range    Sodium 132 (L) 136 - 145 mmol/L    Potassium 4.4 3.5 - 5.1 mmol/L  Chloride 95 (L) 97 - 108 mmol/L    CO2 27 21 - 32 mmol/L    Anion gap 10 5 - 15 mmol/L    Glucose 132 (H) 65 - 100 mg/dL    BUN 14 6 - 20 MG/DL    Creatinine 0.94 0.55 - 1.02 MG/DL    BUN/Creatinine ratio 15 12 - 20      GFR est AA >60 >60 ml/min/1.22m    GFR est non-AA >60 >60 ml/min/1.735m   Calcium 9.2 8.5 - 10.1 MG/DL    Bilirubin, total 0.6 0.2 - 1.0 MG/DL    ALT (SGPT) 26 12 - 78 U/L    AST (SGOT) 40 (H) 15 - 37 U/L    Alk. phosphatase 68 45 - 117 U/L    Protein, total 8.3 (H) 6.4 - 8.2 g/dL    Albumin 2.6 (L) 3.5 - 5.0 g/dL    Globulin 5.7 (H) 2.0 - 4.0 g/dL    A-G Ratio 0.5 (L) 1.1 - 2.2     GLUCOSE, POC    Collection Time: 07/29/17  1:40 PM   Result Value Ref Range    Glucose (POC) 137 (H) 65 - 100 mg/dL    Performed by MARQUEZ NAYGIL    GLUCOSE, POC    Collection Time: 07/29/17  4:40 PM   Result Value Ref Range    Glucose (POC) 97 65 - 100 mg/dL    Performed by CaJohney Frame  GLUCOSE, POC    Collection Time: 07/29/17  9:59 PM   Result Value Ref Range    Glucose (POC) 138 (H) 65 - 100 mg/dL    Performed by DaEdmon Crape  CBC WITH AUTOMATED DIFF    Collection Time: 07/30/17  2:50 AM   Result Value Ref Range    WBC 6.5 3.6 - 11.0 K/uL    RBC 4.13 3.80 - 5.20 M/uL    HGB 11.8 11.5 - 16.0 g/dL    HCT 36.2 35.0 - 47.0 %    MCV 87.7 80.0 - 99.0 FL    MCH 28.6 26.0 - 34.0 PG    MCHC 32.6 30.0 - 36.5 g/dL    RDW 14.3 11.5 - 14.5 %    PLATELET 300 150 - 400 K/uL    MPV 9.8 8.9 - 12.9 FL    NRBC 0.0 0 PER 100 WBC    ABSOLUTE NRBC 0.00 0.00 - 0.01 K/uL    NEUTROPHILS 57 32 - 75 %    LYMPHOCYTES 29 12 - 49 %    MONOCYTES 11 5 - 13 %     EOSINOPHILS 1 0 - 7 %    BASOPHILS 1 0 - 1 %    IMMATURE GRANULOCYTES 1 (H) 0.0 - 0.5 %    ABS. NEUTROPHILS 3.7 1.8 - 8.0 K/UL    ABS. LYMPHOCYTES 1.9 0.8 - 3.5 K/UL    ABS. MONOCYTES 0.7 0.0 - 1.0 K/UL    ABS. EOSINOPHILS 0.1 0.0 - 0.4 K/UL    ABS. BASOPHILS 0.0 0.0 - 0.1 K/UL    ABS. IMM. GRANS. 0.1 (H) 0.00 - 0.04 K/UL    DF AUTOMATED     METABOLIC PANEL, COMPREHENSIVE    Collection Time: 07/30/17  2:50 AM   Result Value Ref Range    Sodium 137 136 - 145 mmol/L    Potassium 2.9 (L) 3.5 - 5.1 mmol/L    Chloride 101 97 - 108 mmol/L    CO2 28 21 -  32 mmol/L    Anion gap 8 5 - 15 mmol/L    Glucose 124 (H) 65 - 100 mg/dL    BUN 10 6 - 20 MG/DL    Creatinine 0.63 0.55 - 1.02 MG/DL    BUN/Creatinine ratio 16 12 - 20      GFR est AA >60 >60 ml/min/1.55m    GFR est non-AA >60 >60 ml/min/1.713m   Calcium 8.6 8.5 - 10.1 MG/DL    Bilirubin, total 0.4 0.2 - 1.0 MG/DL    ALT (SGPT) 21 12 - 78 U/L    AST (SGOT) 11 (L) 15 - 37 U/L    Alk. phosphatase 59 45 - 117 U/L    Protein, total 7.3 6.4 - 8.2 g/dL    Albumin 2.4 (L) 3.5 - 5.0 g/dL    Globulin 4.9 (H) 2.0 - 4.0 g/dL    A-G Ratio 0.5 (L) 1.1 - 2.2     GLUCOSE, POC    Collection Time: 07/30/17  6:24 AM   Result Value Ref Range    Glucose (POC) 175 (H) 65 - 100 mg/dL    Performed by DaEdmon Crape      Medications Reviewed: (see below)    ______________________________________________________________________    Medications:     Current Facility-Administered Medications   Medication Dose Route Frequency   ??? potassium chloride 10 mEq in 100 ml IVPB  10 mEq IntraVENous Q1H   ??? potassium chloride SR (KLOR-CON 10) tablet 40 mEq  40 mEq Oral BID   ??? sertraline (ZOLOFT) tablet 100 mg  100 mg Oral DAILY   ??? sodium chloride (NS) flush 5-10 mL  5-10 mL IntraVENous Q8H   ??? sodium chloride (NS) flush 5-10 mL  5-10 mL IntraVENous PRN   ??? glucose chewable tablet 16 g  4 Tab Oral PRN   ??? dextrose (D50W) injection syrg 12.5-25 g  12.5-25 g IntraVENous PRN    ??? glucagon (GLUCAGEN) injection 1 mg  1 mg IntraMUSCular PRN   ??? pantoprazole (PROTONIX) 40 mg in sodium chloride 0.9% 10 mL injection  40 mg IntraVENous Q12H   ??? ondansetron (ZOFRAN) injection 4 mg  4 mg IntraVENous Q6H PRN   ??? diphenhydrAMINE (BENADRYL) 12.5 mg/5 mL oral elixir 12.5 mg  12.5 mg Oral Q6H PRN   ??? HYDROmorphone (DILAUDID) injection 1 mg  1 mg IntraVENous Q3H PRN   ??? amLODIPine (NORVASC) tablet 5 mg  5 mg Oral DAILY   ??? triamterene-hydroCHLOROthiazide (MAXZIDE) 37.5-25 mg per tablet 1 Tab  1 Tab Oral DAILY   ??? LORazepam (ATIVAN) tablet 1 mg  1 mg Oral Q8H PRN   ??? lactated Ringers infusion  100 mL/hr IntraVENous CONTINUOUS   ??? cefTRIAXone (ROCEPHIN) 1 g in 0.9% sodium chloride (MBP/ADV) 50 mL  1 g IntraVENous Q12H   ??? acetaminophen (TYLENOL) tablet 650 mg  650 mg Oral Q4H PRN   ??? insulin lispro (HUMALOG) injection   SubCUTAneous AC&HS   ??? zolpidem (AMBIEN) tablet 5 mg  5 mg Oral QHS                   Assessment:   Intra abdominal abscess. For additional   Imaging today. Rocephin continues.     Hypokalemia. Mag level ordered. Oral KCl ordered if OK Yahel Fuston/ Gen Sgy    Left foot probable gout better. For now hold on further Colchicine.     DM. Continue Humalog SS.     Patient Active Problem List  Diagnosis Code   ??? Hypercholesterolemia E78.00   ??? Diabetes ([place]) E11.9   ??? Sleep apnea G47.30   ??? Hypertension complicating diabetes ([place]) E11.59, I10   ??? Morbid obesity due to excess calories (HCC) E66.01   ??? Hives L50.9   ??? Acute gastric ulcer with perforation ([place]) K25.1   ??? Arthralgia of both knees M25.561, M25.562   ??? Abdominal pain R10.9   ??? Peritoneal abscess ([place]) K65.1   ??? Intra-abdominal abscess ([place]) K65.1   ??? Hx of gastric ulcer Z87.19          Plan:     As above                                                       ___________________________________________________      Attending Physician: Darlina Rumpf, MD

## 2017-07-30 NOTE — Consults (Signed)
Consults by  Dagoberto Reefhanjan, Cristino Degroff J, MD at 07/30/17 1035                Author: Dagoberto Reefhanjan, Justiss Gerbino J, MD  Service: Gastroenterology  Author Type: Physician       Filed: 07/30/17 1039  Date of Service: 07/30/17 1035  Status: Signed          Editor: Dagoberto Reefhanjan, Emaya Preston J, MD (Physician)            Consult Orders        1. IP CONSULT TO GASTROENTEROLOGY [[id number]] ordered by Hoy FinlayShindel, Stuart, MD at 07/30/17 0904                               [facility]   Ruben ReasonAndy J. Dagmar Haithanjan, M.D.   [phone]                     GASTROENTEROLOGY PROGRESS NOTE            NAME: Dana [name]    DOB:  03-23-1956    MRN:  [id number]            Subjective:     Consulted for possible gastric fistula. Pt has had multiple admissions since surgery for ulcer with persistent drainage.            Objective:     VITALS:    Last 24hrs VS reviewed. Most recent are:   Visit Vitals      BP  138/84 (BP 1 Location: Right arm, BP Patient Position: At rest)     Pulse  82     Temp  98.3 ??F (36.8 ??C)     Resp  18     SpO2  94%        Breastfeeding?  No           Intake/Output Summary (Last 24 hours) at 07/30/2017 1035   Last data filed at 07/29/2017 1914     Gross per 24 hour        Intake  480 ml        Output  --        Net  480 ml           PHYSICAL EXAM:   General: Alert, in no acute distress     Lungs:            CTA Bilaterally   Heart:  Normal S1, S2      Abdomen: Soft, Non distended, tender near drain site .  Normoactive bowel sounds, no HSM,   no rebound/guarding   MSK:   Normal muscle tone   Skin:   Warm to touch   Extremities: Negative bilateral pedal edema     Psych:   Good insight. Not anxious nor agitated.      Lab Data Reviewed:      Recent Labs            07/30/17   0250  07/29/17   1329     WBC  6.5  9.4     HGB  11.8  12.7     HCT  36.2  38.8         PLT  300  334          Recent Labs            07/30/17   0250  07/29/17   1329  NA  137  132*     K  2.9*  4.4     CL  101  95*     CO2  28  27     BUN  10  14     CREA  0.63   0.94     GLU  124*  132*     CA  8.6  9.2         MG  1.8   --           Recent Labs            07/30/17   0250  07/29/17   1329     SGOT  11*  40*     AP  59  68     TP  7.3  8.3*     ALB  2.4*  2.6*         GLOB  4.9*  5.7*        No results for input(s): INR, PTP, APTT in the last 72 hours.      No lab exists for component: INREXT    No results for input(s): FE, TIBC, PSAT, FERR in the last 72 hours.   No results for input(s): CPK, CKMB in the last 72 hours.      No lab exists for component: TOPONINI      See Electronic Medical Record for all procedure/radiology reports and details which were not copied into this note but were reviewed prior to the creation of the Plan.        Assessment:     ??    Acute gastric ulcer with perforation: Abdominal drain in place without resolution. Possible fistula?   ??  OSA    ??  Morbid obesity   ??  Hypokalemia: pt with significant pain with KCl runs. Pt also receiving oral. Stopped KCl run and notified RN who will speak to Dr. Amada JupiterShindel the admitting attending.          Patient Active Problem List        Diagnosis  Code         ?  Hypercholesterolemia  E78.00     ?  Diabetes (HCC)  E11.9     ?  Sleep apnea  G47.30     ?  Hypertension complicating diabetes (HCC)  E11.59, I10     ?  Morbid obesity due to excess calories (HCC)  E66.01     ?  Hives  L50.9     ?  Acute gastric ulcer with perforation (HCC)  K25.1     ?  Arthralgia of both knees  M25.561, M25.562     ?  Abdominal pain  R10.9     ?  Peritoneal abscess (HCC)  K65.1     ?  Intra-abdominal abscess (HCC)  K65.1         ?  Hx of gastric ulcer  Z87.19             Plan:     ??    Replete K   ??  Await fistula study   ??  If fistula, would speak to my colleague regarding the role of [place] clip   ??  Discussed with Dr. Amada JupiterShindel           Signed by: Dagoberto ReefAndy J Shaneisha Burkel, MD          07/30/2017  10:35 AM

## 2017-07-31 LAB — CBC WITH AUTOMATED DIFF
ABS. BASOPHILS: 0.1 10*3/uL (ref 0.0–0.1)
ABS. EOSINOPHILS: 0.1 10*3/uL (ref 0.0–0.4)
ABS. IMM. GRANS.: 0 10*3/uL (ref 0.00–0.04)
ABS. LYMPHOCYTES: 1.8 10*3/uL (ref 0.8–3.5)
ABS. MONOCYTES: 0.6 10*3/uL (ref 0.0–1.0)
ABS. NEUTROPHILS: 3.7 10*3/uL (ref 1.8–8.0)
ABSOLUTE NRBC: 0 10*3/uL (ref 0.00–0.01)
BASOPHILS: 1 % (ref 0–1)
EOSINOPHILS: 1 % (ref 0–7)
HCT: 36 % (ref 35.0–47.0)
HGB: 11.3 g/dL — ABNORMAL LOW (ref 11.5–16.0)
IMMATURE GRANULOCYTES: 1 % — ABNORMAL HIGH (ref 0.0–0.5)
LYMPHOCYTES: 29 % (ref 12–49)
MCH: 28.1 PG (ref 26.0–34.0)
MCHC: 31.4 g/dL (ref 30.0–36.5)
MCV: 89.6 FL (ref 80.0–99.0)
MONOCYTES: 9 % (ref 5–13)
MPV: 10.3 FL (ref 8.9–12.9)
NEUTROPHILS: 59 % (ref 32–75)
NRBC: 0 PER 100 WBC
PLATELET: 301 10*3/uL (ref 150–400)
RBC: 4.02 M/uL (ref 3.80–5.20)
RDW: 14.4 % (ref 11.5–14.5)
WBC: 6.2 10*3/uL (ref 3.6–11.0)

## 2017-07-31 LAB — METABOLIC PANEL, BASIC
Anion gap: 11 mmol/L (ref 5–15)
BUN/Creatinine ratio: 17 (ref 12–20)
BUN: 9 MG/DL (ref 6–20)
CO2: 24 mmol/L (ref 21–32)
Calcium: 8.2 MG/DL — ABNORMAL LOW (ref 8.5–10.1)
Chloride: 103 mmol/L (ref 97–108)
Creatinine: 0.54 MG/DL — ABNORMAL LOW (ref 0.55–1.02)
GFR est AA: >60 mL/min/{1.73_m2} (ref >60)
GFR est non-AA: >60 mL/min/{1.73_m2} (ref >60)
Glucose: 101 mg/dL — ABNORMAL HIGH (ref 65–100)
Potassium: 3.8 mmol/L (ref 3.5–5.1)
Sodium: 138 mmol/L (ref 136–145)

## 2017-07-31 LAB — GLUCOSE, POC
Glucose (POC): 103 mg/dL — ABNORMAL HIGH (ref 65–100)
Glucose (POC): 118 mg/dL — ABNORMAL HIGH (ref 65–100)
Glucose (POC): 112 mg/dL — ABNORMAL HIGH (ref 65–100)
Glucose (POC): 98 mg/dL (ref 65–100)

## 2017-07-31 LAB — MAGNESIUM: Magnesium: 1.6 mg/dL (ref 1.6–2.4)

## 2017-07-31 LAB — PHOSPHORUS: Phosphorus: 3.9 MG/DL (ref 2.6–4.7)

## 2017-07-31 MED ORDER — MVI,ADULT NO.4,VIT K 3300 UNIT-150 MCG/10 ML INTRAVENOUS SOLUTION
3300 unit- 150 mcg/10 mL | INTRAVENOUS | Status: AC
Start: 2017-07-31 — End: 2017-08-01
  Administered 2017-08-01: 01:00:00 via INTRAVENOUS

## 2017-07-31 MED ORDER — COLCHICINE 0.6 MG TAB
0.6 mg | Freq: Once | ORAL | Status: AC
Start: 2017-07-31 — End: 2017-07-31
  Administered 2017-07-31: 18:00:00 via ORAL

## 2017-07-31 MED ORDER — LACTATED RINGERS IV
INTRAVENOUS | Status: DC
Start: 2017-07-31 — End: 2017-08-11
  Administered 2017-08-01 – 2017-08-11 (×12): via INTRAVENOUS

## 2017-07-31 MED ORDER — POTASSIUM CHLORIDE SR 10 MEQ TAB
10 mEq | Freq: Every day | ORAL | Status: AC
Start: 2017-07-31 — End: 2017-08-02
  Administered 2017-07-31 – 2017-08-02 (×3): via ORAL

## 2017-07-31 MED FILL — ACETAMINOPHEN 325 MG TABLET: 325 mg | ORAL | Qty: 2

## 2017-07-31 MED FILL — AMLODIPINE 5 MG TAB: 5 mg | ORAL | Qty: 1

## 2017-07-31 MED FILL — METRO I.V. 500 MG/100 ML INTRAVENOUS PIGGYBACK: 500 mg/100 mL | INTRAVENOUS | Qty: 100

## 2017-07-31 MED FILL — HYDROMORPHONE 2 MG/ML INJECTION SOLUTION: 2 mg/mL | INTRAMUSCULAR | Qty: 1

## 2017-07-31 MED FILL — SERTRALINE 50 MG TAB: 50 mg | ORAL | Qty: 2

## 2017-07-31 MED FILL — NORMAL SALINE FLUSH 0.9 % INJECTION SYRINGE: INTRAMUSCULAR | Qty: 10

## 2017-07-31 MED FILL — K-TAB 10 MEQ TABLET,EXTENDED RELEASE: 10 mEq | ORAL | Qty: 2

## 2017-07-31 MED FILL — COLCHICINE 0.6 MG TAB: 0.6 mg | ORAL | Qty: 1

## 2017-07-31 MED FILL — PROTONIX 40 MG INTRAVENOUS SOLUTION: 40 mg | INTRAVENOUS | Qty: 40

## 2017-07-31 MED FILL — LACTATED RINGERS IV: INTRAVENOUS | Qty: 1000

## 2017-07-31 MED FILL — ZOLPIDEM 5 MG TAB: 5 mg | ORAL | Qty: 1

## 2017-07-31 MED FILL — AMINOSYN II 10 % IV: 10 % | INTRAVENOUS | Qty: 428.4

## 2017-07-31 MED FILL — TRIAMTERENE-HYDROCHLOROTHIAZIDE 37.5 MG-25 MG TAB: ORAL | Qty: 1

## 2017-07-31 MED FILL — CEFTRIAXONE 1 GRAM SOLUTION FOR INJECTION: 1 gram | INTRAMUSCULAR | Qty: 1

## 2017-07-31 NOTE — Progress Notes (Signed)
Progress Note    Patient: Dana [name] MRN: [id number]  SSN: [id number]    Date of Birth: 1956/02/20  Age: 61 y.o.  Sex: female      Admit Date: 07/29/2017    * No surgery found *    Procedure:      Subjective:     Patient with less abdominal pain today.     Objective:     Visit Vitals  BP 137/88   Pulse 79   Temp 97.3 ??F (36.3 ??C)   Resp 16   SpO2 95%   Breastfeeding? No       Temp (24hrs), Avg:97.8 ??F (36.6 ??C), Min:97.3 ??F (36.3 ??C), Max:98.3 ??F (36.8 ??C)      Physical Exam:    Gen- Alert in NAD  Lungs- CTA  H-RRR  Abd- soft non tender-   Drain- Pink thickish fluid- No bilious material     Data Review: images and reports reviewed  IR- Successful upsize of 10 French pigtail catheter to [address] [place].  2. During exchange of the catheter it migrated into the colon. It is uncertain  how a relatively soft larger plastic catheter could do that other than the fact  that most of them significant weakness in the tissues. Final catheter position  is extracolonic in the original cavity.  3. Sinogram failed to demonstrate the fistula/tract suggested on multiple CT  scans extending to the stomach.  Lab Review:   All lab results for the last 24 hours reviewed.  Recent Results (from the past 24 hour(s))   GLUCOSE, POC    Collection Time: 07/30/17  5:32 PM   Result Value Ref Range    Glucose (POC) 106 (H) 65 - 100 mg/dL    Performed by Olive BassKenny Marc    GLUCOSE, POC    Collection Time: 07/30/17 10:32 PM   Result Value Ref Range    Glucose (POC) 103 (H) 65 - 100 mg/dL    Performed by Olive BassKenny Marc    MAGNESIUM    Collection Time: 07/31/17 12:30 AM   Result Value Ref Range    Magnesium 1.6 1.6 - 2.4 mg/dL   PHOSPHORUS    Collection Time: 07/31/17 12:30 AM   Result Value Ref Range    Phosphorus 3.9 2.6 - 4.7 MG/DL   METABOLIC PANEL, BASIC    Collection Time: 07/31/17 12:30 AM   Result Value Ref Range    Sodium 138 136 - 145 mmol/L    Potassium 3.8 3.5 - 5.1 mmol/L    Chloride 103 97 - 108 mmol/L    CO2 24 21 - 32 mmol/L     Anion gap 11 5 - 15 mmol/L    Glucose 101 (H) 65 - 100 mg/dL    BUN 9 6 - 20 MG/DL    Creatinine 1.300.54 (L) 0.55 - 1.02 MG/DL    BUN/Creatinine ratio 17 12 - 20      GFR est AA >60 >60 ml/min/1.6973m2    GFR est non-AA >60 >60 ml/min/1.6573m2    Calcium 8.2 (L) 8.5 - 10.1 MG/DL   CBC WITH AUTOMATED DIFF    Collection Time: 07/31/17 12:30 AM   Result Value Ref Range    WBC 6.2 3.6 - 11.0 K/uL    RBC 4.02 3.80 - 5.20 M/uL    HGB 11.3 (L) 11.5 - 16.0 g/dL    HCT 86.536.0 78.435.0 - 69.647.0 %    MCV 89.6 80.0 - 99.0 FL    MCH 28.1 26.0 -  34.0 PG    MCHC 31.4 30.0 - 36.5 g/dL    RDW 16.114.4 09.611.5 - 04.514.5 %    PLATELET 301 150 - 400 K/uL    MPV 10.3 8.9 - 12.9 FL    NRBC 0.0 0 PER 100 WBC    ABSOLUTE NRBC 0.00 0.00 - 0.01 K/uL    NEUTROPHILS 59 32 - 75 %    LYMPHOCYTES 29 12 - 49 %    MONOCYTES 9 5 - 13 %    EOSINOPHILS 1 0 - 7 %    BASOPHILS 1 0 - 1 %    IMMATURE GRANULOCYTES 1 (H) 0.0 - 0.5 %    ABS. NEUTROPHILS 3.7 1.8 - 8.0 K/UL    ABS. LYMPHOCYTES 1.8 0.8 - 3.5 K/UL    ABS. MONOCYTES 0.6 0.0 - 1.0 K/UL    ABS. EOSINOPHILS 0.1 0.0 - 0.4 K/UL    ABS. BASOPHILS 0.1 0.0 - 0.1 K/UL    ABS. IMM. GRANS. 0.0 0.00 - 0.04 K/UL    DF AUTOMATED     GLUCOSE, POC    Collection Time: 07/31/17  7:06 AM   Result Value Ref Range    Glucose (POC) 118 (H) 65 - 100 mg/dL    Performed by Tobias AlexanderMiller Katarina    GLUCOSE, POC    Collection Time: 07/31/17 12:12 PM   Result Value Ref Range    Glucose (POC) 112 (H) 65 - 100 mg/dL    Performed by Dairl PonderWOODS NIKEISHA        Assessment:     Hospital Problems  Date Reviewed: 07/13/2017          Codes Class Noted POA    Intra-abdominal abscess (HCC) ICD-10-CM: K65.1  ICD-9-CM: 567.22  07/29/2017 Unknown        Hx of gastric ulcer ICD-10-CM: Z87.19  ICD-9-CM: V12.79  07/29/2017 Unknown              Plan/Recommendations/Medical Decision Making:   Seems to be stable after the colon perforation yesterday.    Will continue NPO today.  Start PPN  May be able to start sips if stable tomorrow.   Continue ABX and drain.        Signed By: Hoy FinlayStuart Braniya Farrugia, MD     July 31, 2017

## 2017-07-31 NOTE — Progress Notes (Signed)
Medical Progress Note      NAME: Dana SeatRebecca D Dziedzic   DOB:  1956/01/08  MRM:  [id number]    Date/Time: 07/31/2017      Problem List:   Active Problems:    Intra-abdominal abscess (HCC) (07/29/2017)      Hx of gastric ulcer (07/29/2017)           Subjective:     Events noted. Yesterday during exchange of the pigtail catheter, the colon was entered.  Today , she reports less abdominal pain . Denies nausea, indigestion. Minor left foot tenderness unchanged. No Colchicine yesterday     Past Medical History:   Diagnosis Date   ??? Diabetes (HCC) 01/24/2013   ??? GERD (gastroesophageal reflux disease)    ??? Hives    ??? HTN (hypertension)    ??? Hypercholesterolemia    ??? Perforated gastric ulcer (HCC)             Objective:         Vitals:      Last 24hrs VS reviewed since prior progress note. Most recent are:    Visit Vitals  BP 137/88   Pulse 79   Temp 97.3 ??F (36.3 ??C)   Resp 16   SpO2 95%   Breastfeeding? No     SpO2 Readings from Last 6 Encounters:   07/31/17 95%   07/29/17 94%   07/26/17 96%   07/14/17 96%   06/09/17 98%   05/23/17 98%    O2 Flow Rate (L/min): 3 l/min       Intake/Output Summary (Last 24 hours) at 07/31/2017 1010  Last data filed at 07/31/2017 0755  Gross per 24 hour   Intake 320 ml   Output 25 ml   Net 295 ml                  Exam:      General:  Alert, cooperative, no distress, appears stated age.   Lungs:   Clear to auscultation bilaterally.   Heart:  Regular rate and rhythm, S1, S2 normal, no murmur, click, rub or gallop.   Abdomen:   Minor  RUQ tenderness unchanged. Very little tenderness over abdominal dressing. Dressing in place   Drain with small amount of sanguinous material   BS+ soft, no HSM.     No masses,  No organomegaly.   Extremities: Left dorsal lateral foot. Focal area of  Trace tenderness, Ruhama Lehew/o acute deformity, unchanged.   No synovitis. No b/l pedal edema.     Lab Data Reviewed: (see below)  Recent Results (from the past 24 hour(s))   GLUCOSE, POC    Collection Time: 07/30/17 12:31 PM    Result Value Ref Range    Glucose (POC) 126 (H) 65 - 100 mg/dL    Performed by Memory Argueobinson Tammy R    GLUCOSE, POC    Collection Time: 07/30/17  5:32 PM   Result Value Ref Range    Glucose (POC) 106 (H) 65 - 100 mg/dL    Performed by Olive BassKenny Marc    GLUCOSE, POC    Collection Time: 07/30/17 10:32 PM   Result Value Ref Range    Glucose (POC) 103 (H) 65 - 100 mg/dL    Performed by Olive BassKenny Marc    MAGNESIUM    Collection Time: 07/31/17 12:30 AM   Result Value Ref Range    Magnesium 1.6 1.6 - 2.4 mg/dL   PHOSPHORUS    Collection Time: 07/31/17 12:30 AM   Result  Value Ref Range    Phosphorus 3.9 2.6 - 4.7 MG/DL   METABOLIC PANEL, BASIC    Collection Time: 07/31/17 12:30 AM   Result Value Ref Range    Sodium 138 136 - 145 mmol/L    Potassium 3.8 3.5 - 5.1 mmol/L    Chloride 103 97 - 108 mmol/L    CO2 24 21 - 32 mmol/L    Anion gap 11 5 - 15 mmol/L    Glucose 101 (H) 65 - 100 mg/dL    BUN 9 6 - 20 MG/DL    Creatinine 1.610.54 (L) 0.55 - 1.02 MG/DL    BUN/Creatinine ratio 17 12 - 20      GFR est AA >60 >60 ml/min/1.3073m2    GFR est non-AA >60 >60 ml/min/1.2973m2    Calcium 8.2 (L) 8.5 - 10.1 MG/DL   CBC WITH AUTOMATED DIFF    Collection Time: 07/31/17 12:30 AM   Result Value Ref Range    WBC 6.2 3.6 - 11.0 K/uL    RBC 4.02 3.80 - 5.20 M/uL    HGB 11.3 (L) 11.5 - 16.0 g/dL    HCT 09.636.0 04.535.0 - 40.947.0 %    MCV 89.6 80.0 - 99.0 FL    MCH 28.1 26.0 - 34.0 PG    MCHC 31.4 30.0 - 36.5 g/dL    RDW 81.114.4 91.411.5 - 78.214.5 %    PLATELET 301 150 - 400 K/uL    MPV 10.3 8.9 - 12.9 FL    NRBC 0.0 0 PER 100 WBC    ABSOLUTE NRBC 0.00 0.00 - 0.01 K/uL    NEUTROPHILS 59 32 - 75 %    LYMPHOCYTES 29 12 - 49 %    MONOCYTES 9 5 - 13 %    EOSINOPHILS 1 0 - 7 %    BASOPHILS 1 0 - 1 %    IMMATURE GRANULOCYTES 1 (H) 0.0 - 0.5 %    ABS. NEUTROPHILS 3.7 1.8 - 8.0 K/UL    ABS. LYMPHOCYTES 1.8 0.8 - 3.5 K/UL    ABS. MONOCYTES 0.6 0.0 - 1.0 K/UL    ABS. EOSINOPHILS 0.1 0.0 - 0.4 K/UL    ABS. BASOPHILS 0.1 0.0 - 0.1 K/UL    ABS. IMM. GRANS. 0.0 0.00 - 0.04 K/UL     DF AUTOMATED     GLUCOSE, POC    Collection Time: 07/31/17  7:06 AM   Result Value Ref Range    Glucose (POC) 118 (H) 65 - 100 mg/dL    Performed by Tobias AlexanderMiller Katarina        Medications Reviewed: (see below)    ______________________________________________________________________    Medications:     Current Facility-Administered Medications   Medication Dose Route Frequency   ??? potassium chloride SR (KLOR-CON 10) tablet 20 mEq  20 mEq Oral DAILY   ??? colchicine tablet 0.6 mg  0.6 mg Oral ONCE   ??? metroNIDAZOLE (FLAGYL) IVPB premix 500 mg  500 mg IntraVENous Q12H   ??? sertraline (ZOLOFT) tablet 100 mg  100 mg Oral DAILY   ??? sodium chloride (NS) flush 5-10 mL  5-10 mL IntraVENous Q8H   ??? sodium chloride (NS) flush 5-10 mL  5-10 mL IntraVENous PRN   ??? glucose chewable tablet 16 g  4 Tab Oral PRN   ??? dextrose (D50W) injection syrg 12.5-25 g  12.5-25 g IntraVENous PRN   ??? glucagon (GLUCAGEN) injection 1 mg  1 mg IntraMUSCular PRN   ??? pantoprazole (PROTONIX) 40 mg in  sodium chloride 0.9% 10 mL injection  40 mg IntraVENous Q12H   ??? ondansetron (ZOFRAN) injection 4 mg  4 mg IntraVENous Q6H PRN   ??? diphenhydrAMINE (BENADRYL) 12.5 mg/5 mL oral elixir 12.5 mg  12.5 mg Oral Q6H PRN   ??? HYDROmorphone (DILAUDID) injection 1 mg  1 mg IntraVENous Q3H PRN   ??? amLODIPine (NORVASC) tablet 5 mg  5 mg Oral DAILY   ??? triamterene-hydroCHLOROthiazide (MAXZIDE) 37.5-25 mg per tablet 1 Tab  1 Tab Oral DAILY   ??? LORazepam (ATIVAN) tablet 1 mg  1 mg Oral Q8H PRN   ??? lactated Ringers infusion  100 mL/hr IntraVENous CONTINUOUS   ??? cefTRIAXone (ROCEPHIN) 1 g in 0.9% sodium chloride (MBP/ADV) 50 mL  1 g IntraVENous Q12H   ??? acetaminophen (TYLENOL) tablet 650 mg  650 mg Oral Q4H PRN   ??? insulin lispro (HUMALOG) injection   SubCUTAneous AC&HS   ??? zolpidem (AMBIEN) tablet 5 mg  5 mg Oral QHS                   Assessment:     Intra abdominal abscess, under treatment with Pigtail catheter. Colon entered yesterday during Pigtail catheter exchange.    Flagyl was added yesterday. Rocephin continues .     Left foot probable gout improved versus earlier in the week. Colchicine last given 2 nights ago. Will give one more dose today .     Hypokalemia. Better. KCl ordered daily     HTN controlled.     DM controlled on SS Humalog   Patient Active Problem List   Diagnosis Code   ??? Hypercholesterolemia E78.00   ??? Diabetes (HCC) E11.9   ??? Sleep apnea G47.30   ??? Hypertension complicating diabetes (HCC) E11.59, I10   ??? Morbid obesity due to excess calories (HCC) E66.01   ??? Hives L50.9   ??? Acute gastric ulcer with perforation (HCC) K25.1   ??? Arthralgia of both knees M25.561, M25.562   ??? Abdominal pain R10.9   ??? Peritoneal abscess (HCC) K65.1   ??? Intra-abdominal abscess (HCC) K65.1   ??? Hx of gastric ulcer Z87.19          Plan:     F/u labs .                                                        ___________________________________________________      Attending Physician: Ysidro Evert, MD

## 2017-07-31 NOTE — Progress Notes (Signed)
Bedside and Verbal shift change report given to Karla, RN (oncoming nurse) by Dezi Brauner, RN (offgoing nurse). Report included the following information SBAR, Kardex, ED Summary, Intake/Output, MAR and Recent Results.

## 2017-07-31 NOTE — Progress Notes (Signed)
[facility]ITAL[facility]y J. Dagmar Haithanjan, M.D.  [phone]               GASTROENTEROLOGY PROGRESS NOTE      NAME: Dana [name]   DOB:  06/24/1956   MRN:  [id number]       Subjective:   Reviewed IR eval with patient.       Objective:   VITALS:   Last 24hrs VS reviewed. Most recent are:  Visit Vitals  BP 137/88   Pulse 79   Temp 97.3 ??F (36.3 ??C)   Resp 16   SpO2 95%   Breastfeeding? No       Intake/Output Summary (Last 24 hours) at 07/31/2017 0923  Last data filed at 07/31/2017 0755  Gross per 24 hour   Intake 320 ml   Output 25 ml   Net 295 ml       PHYSICAL EXAM:  General: Alert, in no acute distress    HEENT: Anicteric conjunctiva.  Lungs:            CTA Bilaterally  Heart:  Normal S1, S2    Abdomen: Soft, Non distended, generalized tender.  Normoactive bowel sounds, no rebound/guarding  MSK:   Normal muscle tone  Skin:   Warm to touch  Psych:   Good insight. Not anxious nor agitated.    Lab Data Reviewed:   Recent Labs     07/31/17  0030 07/30/17  0250   WBC 6.2 6.5   HGB 11.3* 11.8   HCT 36.0 36.2   PLT 301 300     Recent Labs     07/31/17  0030 07/30/17  0250   NA 138 137   K 3.8 2.9*   CL 103 101   CO2 24 28   BUN 9 10   CREA 0.54* 0.63   GLU 101* 124*   CA 8.2* 8.6   MG 1.6 1.8   PHOS 3.9  --      Recent Labs     07/30/17  0250 07/29/17  1329   SGOT 11* 40*   AP 59 68   TP 7.3 8.3*   ALB 2.4* 2.6*   GLOB 4.9* 5.7*     No results for input(s): INR, PTP, APTT in the last 72 hours.    No lab exists for component: INREXT   No results for input(s): FE, TIBC, PSAT, FERR in the last 72 hours.  No results for input(s): CPK, CKMB in the last 72 hours.    No lab exists for component: TOPONINI    See Electronic Medical Record for all procedure/radiology reports and details which were not copied into this note but were reviewed prior to the creation of the Plan.    Assessment:    ?? Acute gastric ulcer with perforation: Abdominal drain in place without  resolution-adjusted. No fistula seen on sinogram. Colon perforation.  ?? OSA   ?? Morbid obesity   ??       Patient Active Problem List   Diagnosis Code   ??? Hypercholesterolemia E78.00   ??? Diabetes (HCC) E11.9   ??? Sleep apnea G47.30   ??? Hypertension complicating diabetes (HCC) E11.59, I10   ??? Morbid obesity due to excess calories (HCC) E66.01   ??? Hives L50.9   ??? Acute gastric ulcer with perforation (HCC) K25.1   ??? Arthralgia of both knees M25.561, M25.562   ??? Abdominal pain R10.9   ??? Peritoneal abscess (HCC) K65.1   ???  Intra-abdominal abscess (HCC) K65.1   ??? Hx of gastric ulcer Z87.19   ??  ??      Plan:    ?? Will sign off. Please call with any questions.         Signed by: Dagoberto ReefAndy J Corin Tilly, MD         07/31/2017  9:23 AM

## 2017-08-01 LAB — METABOLIC PANEL, BASIC
Anion gap: 10 mmol/L (ref 5–15)
BUN/Creatinine ratio: 22 — ABNORMAL HIGH (ref 12–20)
BUN: 9 MG/DL (ref 6–20)
CO2: 27 mmol/L (ref 21–32)
Calcium: 8.6 MG/DL (ref 8.5–10.1)
Chloride: 103 mmol/L (ref 97–108)
Creatinine: 0.41 MG/DL — ABNORMAL LOW (ref 0.55–1.02)
GFR est AA: >60 mL/min/{1.73_m2} (ref >60)
GFR est non-AA: >60 mL/min/{1.73_m2} (ref >60)
Glucose: 151 mg/dL — ABNORMAL HIGH (ref 65–100)
Potassium: 3.5 mmol/L (ref 3.5–5.1)
Sodium: 140 mmol/L (ref 136–145)

## 2017-08-01 LAB — CBC W/O DIFF
ABSOLUTE NRBC: 0 10*3/uL (ref 0.00–0.01)
HCT: 36.9 % (ref 35.0–47.0)
HGB: 11.6 g/dL (ref 11.5–16.0)
MCH: 28.8 PG (ref 26.0–34.0)
MCHC: 31.4 g/dL (ref 30.0–36.5)
MCV: 91.6 FL (ref 80.0–99.0)
MPV: 9.8 FL (ref 8.9–12.9)
NRBC: 0 PER 100 WBC
PLATELET: 280 10*3/uL (ref 150–400)
RBC: 4.03 M/uL (ref 3.80–5.20)
RDW: 14.4 % (ref 11.5–14.5)
WBC: 5.2 10*3/uL (ref 3.6–11.0)

## 2017-08-01 LAB — GLUCOSE, POC
Glucose (POC): 130 mg/dL — ABNORMAL HIGH (ref 65–100)
Glucose (POC): 161 mg/dL — ABNORMAL HIGH (ref 65–100)
Glucose (POC): 154 mg/dL — ABNORMAL HIGH (ref 65–100)
Glucose (POC): 161 mg/dL — ABNORMAL HIGH (ref 65–100)

## 2017-08-01 MED ORDER — DEXTROSE 70% IN WATER (D70W) IV
10010 mg/mL (10%) | INTRAVENOUS | Status: AC
Start: 2017-08-01 — End: 2017-08-02
  Administered 2017-08-01: via INTRAVENOUS

## 2017-08-01 MED FILL — CEFTRIAXONE 1 GRAM SOLUTION FOR INJECTION: 1 gram | INTRAMUSCULAR | Qty: 1

## 2017-08-01 MED FILL — PROTONIX 40 MG INTRAVENOUS SOLUTION: 40 mg | INTRAVENOUS | Qty: 40

## 2017-08-01 MED FILL — NORMAL SALINE FLUSH 0.9 % INJECTION SYRINGE: INTRAMUSCULAR | Qty: 10

## 2017-08-01 MED FILL — SERTRALINE 50 MG TAB: 50 mg | ORAL | Qty: 2

## 2017-08-01 MED FILL — ZOLPIDEM 5 MG TAB: 5 mg | ORAL | Qty: 1

## 2017-08-01 MED FILL — TRIAMTERENE-HYDROCHLOROTHIAZIDE 37.5 MG-25 MG TAB: ORAL | Qty: 1

## 2017-08-01 MED FILL — K-TAB 10 MEQ TABLET,EXTENDED RELEASE: 10 mEq | ORAL | Qty: 2

## 2017-08-01 MED FILL — DIPHENHYDRAMINE 12.5 MG/5 ML ELIXIR: 12.5 mg/5 mL | ORAL | Qty: 5

## 2017-08-01 MED FILL — METRO I.V. 500 MG/100 ML INTRAVENOUS PIGGYBACK: 500 mg/100 mL | INTRAVENOUS | Qty: 100

## 2017-08-01 MED FILL — AMLODIPINE 5 MG TAB: 5 mg | ORAL | Qty: 1

## 2017-08-01 MED FILL — AMINOSYN II 10 % IV: 10 % | INTRAVENOUS | Qty: 428.4

## 2017-08-01 MED FILL — ACETAMINOPHEN 325 MG TABLET: 325 mg | ORAL | Qty: 2

## 2017-08-01 NOTE — Progress Notes (Signed)
Medical Progress Note      NAME: Dana [name]   DOB:  December 30, 1955  MRM:  [id number]    Date/Time: 08/01/2017  1:54 PM    Problem List:   Active Problems:    Intra-abdominal abscess (HCC) (07/29/2017)      Hx of gastric ulcer (07/29/2017)           Subjective:     Patient feeling ok, has slight  Pain in foot,    Past Medical History:   Diagnosis Date   ??? Diabetes (HCC) 01/24/2013   ??? GERD (gastroesophageal reflux disease)    ??? Hives    ??? HTN (hypertension)    ??? Hypercholesterolemia    ??? Perforated gastric ulcer (HCC)        ROS:  General: negative for fever, chills, sweats, weakness  Respiratory:  negative for cough, sputum production, SOB, wheezing, DOE, pleuritic pain  Cardiology:  negative for chest pain, palpitations, orthopnea, PND, edema, syncope   Gastrointestinal: negative for abdominal pain, N/V, dysphagia, change in bowel habits, bleeding         Objective:       Vitals:          Last 24hrs VS reviewed since prior progress note. Most recent are:    Visit Vitals  BP (!) 128/93 (BP 1 Location: Right arm, BP Patient Position: At rest)   Pulse 83   Temp 97.8 ??F (36.6 ??C)   Resp 19   Wt 220 lb 6.4 oz (100 kg)   SpO2 96%   Breastfeeding? No   BMI 37.83 kg/m??     SpO2 Readings from Last 6 Encounters:   08/01/17 96%   07/29/17 94%   07/26/17 96%   07/14/17 96%   06/09/17 98%   05/23/17 98%    O2 Flow Rate (L/min): 3 l/min       Intake/Output Summary (Last 24 hours) at 08/01/2017 1354  Last data filed at 08/01/2017 0400  Gross per 24 hour   Intake 820 ml   Output 50 ml   Net 770 ml          Exam:     General   well developed, well nourished, appears stated age, in no acute distress  Respiratory   Clear To Auscultation bilaterally - no wheezes, rales, rhonchi, or crackles  Cardiology  Regular Rate and Rythmn  - no murmurs, rubs or gallops  Abdominal  Soft, dec bs. Dressing over catheter in place  Extremities  No clubbing, cyanosis, or edema. Pulses intact.    Lab Data Reviewed: (see below)     Medications Reviewed: (see below)    ______________________________________________________________________    Medications:     Current Facility-Administered Medications   Medication Dose Route Frequency   ??? TPN ADULT - PERIPHERAL   IntraVENous CONTINUOUS   ??? potassium chloride SR (KLOR-CON 10) tablet 20 mEq  20 mEq Oral DAILY   ??? TPN ADULT - PERIPHERAL   IntraVENous CONTINUOUS   ??? lactated Ringers infusion  50 mL/hr IntraVENous CONTINUOUS   ??? metroNIDAZOLE (FLAGYL) IVPB premix 500 mg  500 mg IntraVENous Q12H   ??? sertraline (ZOLOFT) tablet 100 mg  100 mg Oral DAILY   ??? sodium chloride (NS) flush 5-10 mL  5-10 mL IntraVENous Q8H   ??? sodium chloride (NS) flush 5-10 mL  5-10 mL IntraVENous PRN   ??? glucose chewable tablet 16 g  4 Tab Oral PRN   ??? dextrose (D50W) injection syrg 12.5-25 g  12.5-25  g IntraVENous PRN   ??? glucagon (GLUCAGEN) injection 1 mg  1 mg IntraMUSCular PRN   ??? pantoprazole (PROTONIX) 40 mg in sodium chloride 0.9% 10 mL injection  40 mg IntraVENous Q12H   ??? ondansetron (ZOFRAN) injection 4 mg  4 mg IntraVENous Q6H PRN   ??? diphenhydrAMINE (BENADRYL) 12.5 mg/5 mL oral elixir 12.5 mg  12.5 mg Oral Q6H PRN   ??? HYDROmorphone (DILAUDID) injection 1 mg  1 mg IntraVENous Q3H PRN   ??? amLODIPine (NORVASC) tablet 5 mg  5 mg Oral DAILY   ??? triamterene-hydroCHLOROthiazide (MAXZIDE) 37.5-25 mg per tablet 1 Tab  1 Tab Oral DAILY   ??? LORazepam (ATIVAN) tablet 1 mg  1 mg Oral Q8H PRN   ??? lactated Ringers infusion  100 mL/hr IntraVENous CONTINUOUS   ??? cefTRIAXone (ROCEPHIN) 1 g in 0.9% sodium chloride (MBP/ADV) 50 mL  1 g IntraVENous Q12H   ??? acetaminophen (TYLENOL) tablet 650 mg  650 mg Oral Q4H PRN   ??? insulin lispro (HUMALOG) injection   SubCUTAneous AC&HS   ??? zolpidem (AMBIEN) tablet 5 mg  5 mg Oral QHS            Lab Review:     Recent Labs     08/01/17  0335 07/31/17  0030 07/30/17  0250   WBC 5.2 6.2 6.5   HGB 11.6 11.3* 11.8   HCT 36.9 36.0 36.2   PLT 280 301 300     Recent Labs     08/01/17  0335 07/31/17   0030 07/30/17  0250   NA 140 138 137   K 3.5 3.8 2.9*   CL 103 103 101   CO2 27 24 28    GLU 151* 101* 124*   BUN 9 9 10    CREA 0.41* 0.54* 0.63   CA 8.6 8.2* 8.6   MG  --  1.6 1.8   PHOS  --  3.9  --    ALB  --   --  2.4*   TBILI  --   --  0.4   SGOT  --   --  11*   ALT  --   --  21     Lab Results   Component Value Date/Time    Glucose (POC) 154 (H) 08/01/2017 11:31 AM    Glucose (POC) 161 (H) 08/01/2017 06:07 AM    Glucose (POC) 130 (H) 07/31/2017 10:28 PM    Glucose (POC) 98 07/31/2017 05:21 PM    Glucose (POC) 112 (H) 07/31/2017 12:12 PM     No results for input(s): PH, PCO2, PO2, HCO3, FIO2 in the last 72 hours.  No results for input(s): INR in the last 72 hours.    No lab exists for component: INREXT    Other pertinent lab: NA         Assessment:     Patient Active Problem List   Diagnosis Code   ??? Hypercholesterolemia E78.00   ??? Diabetes (HCC) E11.9   ??? Sleep apnea G47.30   ??? Hypertension complicating diabetes (HCC) E11.59, I10   ??? Morbid obesity due to excess calories (HCC) E66.01   ??? Hives L50.9   ??? Acute gastric ulcer with perforation (HCC) K25.1   ??? Arthralgia of both knees M25.561, M25.562   ??? Abdominal pain R10.9   ??? Peritoneal abscess (HCC) K65.1   ??? Intra-abdominal abscess (HCC) K65.1   ??? Hx of gastric ulcer Z87.19          Plan:  1. Abdominal abscess- perc drainage  2. Nutrition- on tpn  3. Diabetes- insulin                ___________________________________________________    Attending Physician: Lake BellsNancy J Aasha Dina, MD

## 2017-08-01 NOTE — Progress Notes (Signed)
Bedside shift change report given to Roxanne  (Cabin crewoncoming nurse) by Lanora ManisElizabeth  (offgoing nurse). Report included the following information SBAR, Kardex, Intake/Output and MAR.

## 2017-08-01 NOTE — Progress Notes (Signed)
Reason for Readmission:  61 year old female 8 week status post repair of gastric ulcer   Post op peri-gastric collection +/- fistula  Abdominal drain malpositioned            RRAT Score and Risk Level:   12       Level of Readmission:   1       Care Conference scheduled:   Care conference held daily with patient/physician       Resources/supports as identified by patient/family:   Patient lives with significant other       Top Challenges facing patient (as identified by patient/family and CM):         Finances/Medication cost? 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  Good support  Living arrangements?   Lives with significant other        Self-care/ADLs/Cognition?  Independent and employed          Current Advanced Directive/Advance Care Plan: Not on file            Plan for utilizing home health:  Open to At Home Care             Likelihood of additional readmission:   mod             Transition of Care Plan:   Patient ws recently hospitalized from 11/11-11/20 due to peritoneal abscess with history of acute gastric ulcer with perforation in September. This is patient 4th hospitalization in 3 months. Patient was discharged home with home health services when she experience increased drainage around catheter in the abscess cavity. Currently patient has had catheter up sized and is on clears with IV ABX. CM to follow for discharge needs. Will need home health resumption orders at discharge.     Care Management Interventions  MyChart Signup: No  Physical Therapy Consult: No  Occupational Therapy Consult: No  Speech Therapy Consult: No  Current Support Network: Other(Lives with significant other)  Plan discussed with Pt/Family/Caregiver: Yes  Freedom of Choice Offered: Yes     [phone]

## 2017-08-01 NOTE — Wound Image (Signed)
WOCN Note:     Follow-up visit for pigtail drain site with periwound erosion/MASD.     Chart shows:  Laparotomy exploratory perforated posterior ulcer repair over sew with patch on 05/12/2017. She was admitted on 07/02/2017 for abdominal pain and 07/17/2017 for peritoneal abscess. 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 She was last discharged home on 07/26/2017. She presents today with complaints of pus draining from around her pigtail drain site  Pigtail drain exchange 07/29/17 with colon perforation.   ??  Assessment:   Patient is A&O x 4, continent and mobile and continent.   Patient reports tenderness to skin around drain site.   ??  1. POA, central abdominal drain site with a 2.5 x 2.5 cm field (smaller) of induration and moist red desquamation from effluent.  There is a pinhole very close to the drain insertion site that may be where a stitch pulled loose and then a second hole laterally with small amount of purulent malodorous exudate similar to that coming out of drain. The amount from adjacent hole is much reduced from my last assessment.   Marathon applied to protect skin and dry cover dressing applied.    Recommendations:    Continue to use Marathon skin protectant as needed every 2-3 days after it flakes off around drain and leaking adjacent site.     Discussed above plan with patient.    Transition of Care: Plan to follow weekly and as needed while admitted to hospital.     Rodman PickleJulie Kasarah Sitts, BSN, RN, [facility]  Certified Wound, Ostomy, Continence Nurse  office [phone]  pager [phone] or call operator to page

## 2017-08-01 NOTE — Progress Notes (Signed)
[facility]      Subjective     Less drainage, NPO on ice, no N/V, on PPN    Objective     Patient Vitals for the past 24 hrs:   Temp Pulse Resp BP SpO2   08/01/17 0901 97.8 ??F (36.6 ??C) 83 19 (!) 128/93 96 %   08/01/17 0325 98.2 ??F (36.8 ??C) 75 18 (!) 147/98 92 %   07/31/17 1725 97.5 ??F (36.4 ??C) 81 18 135/90 94 %       Date 07/31/17 0700 - 08/01/17 0659 08/01/17 0700 - 08/02/17 0659   Shift 0700-1859 1900-0659 24 Hour Total 0700-1859 1900-0659 24 Hour Total   INTAKE   I.V.(mL/kg/hr) 800(0.7)  800(0.3)        Volume (lactated Ringers infusion) 800  800      NG/GT 10 10 20         Intake (ml) (Drain mid abdominal abcess drain 07/30/17 Mid;Upper Abdomen) 10 10 20       Shift Total(mL/kg) 810(8.1) 10(0.1) 820(8.2)      OUTPUT   Urine(mL/kg/hr)           Urine Occurrence(s) 4 x  4 x      Drains 40 30 70        Output (ml) (Drain mid abdominal abcess drain 07/30/17 Mid;Upper Abdomen) 40 30 70      Stool           Stool Occurrence(s) 1 x  1 x      Shift Total(mL/kg) 40(0.4) 30(0.3) 70(0.7)      NET 770 -20 750      Weight (kg) 100 100 100 100 100 100       PE  Pulm - CTAB  CV - RRR  Abd - soft, ND, BS present, Drain in place with pururlent drainage    Labs  Recent Results (from the past 12 hour(s))   METABOLIC PANEL, BASIC    Collection Time: 08/01/17  3:35 AM   Result Value Ref Range    Sodium 140 136 - 145 mmol/L    Potassium 3.5 3.5 - 5.1 mmol/L    Chloride 103 97 - 108 mmol/L    CO2 27 21 - 32 mmol/L    Anion gap 10 5 - 15 mmol/L    Glucose 151 (H) 65 - 100 mg/dL    BUN 9 6 - 20 MG/DL    Creatinine 1.610.41 (L) 0.55 - 1.02 MG/DL    BUN/Creatinine ratio 22 (H) 12 - 20      GFR est AA >60 >60 ml/min/1.4973m2    GFR est non-AA >60 >60 ml/min/1.1573m2    Calcium 8.6 8.5 - 10.1 MG/DL   CBC W/O DIFF    Collection Time: 08/01/17  3:35 AM   Result Value Ref Range    WBC 5.2 3.6 - 11.0 K/uL    RBC 4.03 3.80 - 5.20 M/uL    HGB 11.6 11.5 - 16.0 g/dL     HCT 09.636.9 04.535.0 - 40.947.0 %    MCV 91.6 80.0 - 99.0 FL    MCH 28.8 26.0 - 34.0 PG    MCHC 31.4 30.0 - 36.5 g/dL    RDW 81.114.4 91.411.5 - 78.214.5 %    PLATELET 280 150 - 400 K/uL    MPV 9.8 8.9 - 12.9 FL    NRBC 0.0 0 PER 100 WBC    ABSOLUTE NRBC 0.00 0.00 - 0.01 K/uL   GLUCOSE, POC    Collection  Time: 08/01/17  6:07 AM   Result Value Ref Range    Glucose (POC) 161 (H) 65 - 100 mg/dL    Performed by Karma GanjaYoung Latoya          Assessment     Dana [name] is a 61 y.o.yr old female with abdominal abscess    Plan     Keeping the drainage catheter in place, it is upsized  Continue PPN and start clears today  Cont IV abx  No feculent drainage present    Marton RedwoodNathan Jaquasha Carnevale, MD

## 2017-08-02 ENCOUNTER — Inpatient Hospital Stay: Admit: 2017-08-02 | Payer: BLUE CROSS/BLUE SHIELD | Primary: Internal Medicine

## 2017-08-02 LAB — METABOLIC PANEL, BASIC
Anion gap: 13 mmol/L (ref 5–15)
BUN/Creatinine ratio: 20 (ref 12–20)
BUN: 9 MG/DL (ref 6–20)
CO2: 20 mmol/L — ABNORMAL LOW (ref 21–32)
Calcium: 8.4 MG/DL — ABNORMAL LOW (ref 8.5–10.1)
Chloride: 106 mmol/L (ref 97–108)
Creatinine: 0.46 MG/DL — ABNORMAL LOW (ref 0.55–1.02)
GFR est AA: >60 mL/min/{1.73_m2} (ref >60)
GFR est non-AA: >60 mL/min/{1.73_m2} (ref >60)
Glucose: 165 mg/dL — ABNORMAL HIGH (ref 65–100)
Potassium: 4 mmol/L (ref 3.5–5.1)
Sodium: 139 mmol/L (ref 136–145)

## 2017-08-02 LAB — CBC W/O DIFF
ABSOLUTE NRBC: 0 10*3/uL (ref 0.00–0.01)
HCT: 36.4 % (ref 35.0–47.0)
HGB: 11.5 g/dL (ref 11.5–16.0)
MCH: 28.3 PG (ref 26.0–34.0)
MCHC: 31.6 g/dL (ref 30.0–36.5)
MCV: 89.4 FL (ref 80.0–99.0)
MPV: 9.8 FL (ref 8.9–12.9)
NRBC: 0 PER 100 WBC
PLATELET: 280 10*3/uL (ref 150–400)
RBC: 4.07 M/uL (ref 3.80–5.20)
RDW: 14.8 % — ABNORMAL HIGH (ref 11.5–14.5)
WBC: 5.9 10*3/uL (ref 3.6–11.0)

## 2017-08-02 LAB — GLUCOSE, POC
Glucose (POC): 170 mg/dL — ABNORMAL HIGH (ref 65–100)
Glucose (POC): 203 mg/dL — ABNORMAL HIGH (ref 65–100)
Glucose (POC): 149 mg/dL — ABNORMAL HIGH (ref 65–100)

## 2017-08-02 MED ORDER — DEXTROSE 70% IN WATER (D70W) IV
2 mEq/mL | INTRAVENOUS | Status: AC
Start: 2017-08-02 — End: 2017-08-03
  Administered 2017-08-02: 23:00:00 via INTRAVENOUS

## 2017-08-02 MED ORDER — POTASSIUM ACETATE 2 MEQ/ML IV
2 mEq/mL | INTRAVENOUS | Status: DC
Start: 2017-08-02 — End: 2017-08-04
  Administered 2017-08-04: via INTRAVENOUS

## 2017-08-02 MED ORDER — IOHEXOL 350 MG IODINE/ML INTRAVENOUS SOLUTION
350 mg iodine/mL | Freq: Once | INTRAVENOUS | Status: AC
Start: 2017-08-02 — End: 2017-08-02
  Administered 2017-08-02: 19:00:00 via ORAL

## 2017-08-02 MED FILL — NORMAL SALINE FLUSH 0.9 % INJECTION SYRINGE: INTRAMUSCULAR | Qty: 10

## 2017-08-02 MED FILL — TRIAMTERENE-HYDROCHLOROTHIAZIDE 37.5 MG-25 MG TAB: ORAL | Qty: 1

## 2017-08-02 MED FILL — PROTONIX 40 MG INTRAVENOUS SOLUTION: 40 mg | INTRAVENOUS | Qty: 40

## 2017-08-02 MED FILL — AMLODIPINE 5 MG TAB: 5 mg | ORAL | Qty: 1

## 2017-08-02 MED FILL — INSULIN LISPRO 100 UNIT/ML INJECTION: 100 unit/mL | SUBCUTANEOUS | Qty: 1

## 2017-08-02 MED FILL — K-TAB 10 MEQ TABLET,EXTENDED RELEASE: 10 mEq | ORAL | Qty: 2

## 2017-08-02 MED FILL — DIPHENHYDRAMINE 12.5 MG/5 ML ELIXIR: 12.5 mg/5 mL | ORAL | Qty: 5

## 2017-08-02 MED FILL — OMNIPAQUE 350 MG IODINE/ML INTRAVENOUS SOLUTION: 350 mg iodine/mL | INTRAVENOUS | Qty: 100

## 2017-08-02 MED FILL — CEFTRIAXONE 1 GRAM SOLUTION FOR INJECTION: 1 gram | INTRAMUSCULAR | Qty: 1

## 2017-08-02 MED FILL — AMINOSYN II 10 % IV: 10 % | INTRAVENOUS | Qty: 428.4

## 2017-08-02 MED FILL — METRO I.V. 500 MG/100 ML INTRAVENOUS PIGGYBACK: 500 mg/100 mL | INTRAVENOUS | Qty: 100

## 2017-08-02 MED FILL — SERTRALINE 50 MG TAB: 50 mg | ORAL | Qty: 2

## 2017-08-02 MED FILL — ZOLPIDEM 5 MG TAB: 5 mg | ORAL | Qty: 1

## 2017-08-02 MED FILL — ACETAMINOPHEN 325 MG TABLET: 325 mg | ORAL | Qty: 2

## 2017-08-02 NOTE — Progress Notes (Signed)
Bedside and Verbal shift change report given to Ricard DillonElizabeth Rn (oncoming nurse) by Leandro Reasoneroxanne  (offgoing nurse). Report included the following information SBAR.

## 2017-08-02 NOTE — Procedures (Signed)
PICC Placement Note    PRE-PROCEDURE VERIFICATION    1335: Order for PICC verified. Consent obtained from patient after risks, benefits, and procedure was explained. Time given to answer questions and/or concerns.    Correct Procedure: yes  Correct Site:  yes  Temperature: Temp: 97.8 ??F (36.6 ??C), Temperature Source: Temp Source: Oral  Recent Labs     08/02/17  0830 08/02/17  0700   BUN  --  9   CREA  --  0.46*   PLT 280  --    WBC 5.9  --      Allergies: Adhesive and Dolobid [diflunisal]  Education materials, including PICC Booklet, for PICC Care given to patient: yes.   See Patient Education activity for further details.    PROCEDURE DETAIL  A double lumen PICC line was started for vascular access, TPN, desire for reliable access and nurse/physician request. The following documentation is in addition to the PICC properties in the lines/airways flowsheet :  Lot #: [id number]  Was xylocaine 1% used intradermally:  yes  Catheter Length: 38 cm  External Catheter Length: 0cm  Vein Selection for PICC: right brachial  Central Line Bundle followed yes  Complication Related to Insertion: none    The placement was verified by ECG/[employer]:  The tip location is in the superior vena cava. See ECG results for PICC tip placement.    Report given to Elizabeth, RN.    Line is okay to use.    Dana [name], BSN, RN, VA-BC  Vascular Access Team

## 2017-08-02 NOTE — Progress Notes (Addendum)
[name]  [address]  [place], [place] [id number]       GI PROGRESS NOTE  Will Broadus JohnWarren, [phone] office  [phone] NP/PA in-[name] cell phone M-F until 4:30PM  After 5PM or on weekends, please call operator for physician on call    NAME: Neva SeatRebecca D Gilardi   DOB:  [birth year]   MRN:  [id number]       Subjective:   Patient is sitting upright in bed.  She denies nausea, vomiting, or abdominal pain.  She has no complaints and is tolerating clear liquids.    Objective:     VITALS:   Last 24hrs VS reviewed since prior progress note. Most recent are:  Visit Vitals  BP (!) 138/95 (BP 1 Location: Right arm, BP Patient Position: Head of bed elevated (Comment degrees))   Pulse 88   Temp 97.5 ??F (36.4 ??C)   Resp 16   Ht 5\' 4"  (1.626 m)   Wt 91.5 kg (201 lb 11.5 oz)   SpO2 97%   Breastfeeding? No   BMI 34.63 kg/m??       PHYSICAL EXAM:  General: Cooperative, no acute distress  Neurologic:?? Alert and oriented  HEENT: EOMI, no scleral icterus   Lungs:  CTA bilaterally anteriorly  Heart:  S1 S2  Abdomen: Soft, non-distended, no tenderness, no guarding, no rebound. +Bowel sounds. Dressing and drain in place with minimal purulent output in drainage bag (drained this morning).   Extremities: Warm  Psych:???? Not anxious or agitated    Lab Data Reviewed:     Recent Results (from the past 24 hour(s))   GLUCOSE, POC    Collection Time: 08/01/17  4:24 PM   Result Value Ref Range    Glucose (POC) 161 (H) 65 - 100 mg/dL    Performed by Vinetta Bergamoarrington  Tameisha    GLUCOSE, POC    Collection Time: 08/01/17  9:31 PM   Result Value Ref Range    Glucose (POC) 170 (H) 65 - 100 mg/dL    Performed by Popejoy  Roxanne    METABOLIC PANEL, BASIC    Collection Time: 08/02/17  7:00 AM   Result Value Ref Range    Sodium 139 136 - 145 mmol/L    Potassium 4.0 3.5 - 5.1 mmol/L    Chloride 106 97 - 108 mmol/L    CO2 20 (L) 21 - 32 mmol/L    Anion gap 13 5 - 15 mmol/L    Glucose 165 (H) 65 - 100 mg/dL    BUN 9 6 - 20 MG/DL     Creatinine 2.130.46 (L) 0.55 - 1.02 MG/DL    BUN/Creatinine ratio 20 12 - 20      GFR est AA >60 >60 ml/min/1.3173m2    GFR est non-AA >60 >60 ml/min/1.1173m2    Calcium 8.4 (L) 8.5 - 10.1 MG/DL   CBC W/O DIFF    Collection Time: 08/02/17  8:30 AM   Result Value Ref Range    WBC 5.9 3.6 - 11.0 K/uL    RBC 4.07 3.80 - 5.20 M/uL    HGB 11.5 11.5 - 16.0 g/dL    HCT 08.636.4 57.835.0 - 46.947.0 %    MCV 89.4 80.0 - 99.0 FL    MCH 28.3 26.0 - 34.0 PG    MCHC 31.6 30.0 - 36.5 g/dL    RDW 62.914.8 (H) 52.811.5 - 14.5 %    PLATELET 280 150 - 400 K/uL    MPV 9.8  8.9 - 12.9 FL    NRBC 0.0 0 PER 100 WBC    ABSOLUTE NRBC 0.00 0.00 - 0.01 K/uL   GLUCOSE, POC    Collection Time: 08/02/17 11:57 AM   Result Value Ref Range    Glucose (POC) 203 (H) 65 - 100 mg/dL    Performed by Reed BreechJACKSON TONIA        Assessment:   ?? Intra-abdominal abscess: drainage catheter in place, on antibiotics.   ?? History of perforated posterior prepyloric ulcer with repair (05/13/17)  ?? Obstructive sleep apnea  ?? Morbid obesity     Patient Active Problem List   Diagnosis Code   ??? Hypercholesterolemia E78.00   ??? Diabetes (HCC) E11.9   ??? Sleep apnea G47.30   ??? Hypertension complicating diabetes (HCC) E11.59, I10   ??? Morbid obesity due to excess calories (HCC) E66.01   ??? Hives L50.9   ??? Acute gastric ulcer with perforation (HCC) K25.1   ??? Arthralgia of both knees M25.561, M25.562   ??? Abdominal pain R10.9   ??? Peritoneal abscess (HCC) K65.1   ??? Intra-abdominal abscess (HCC) K65.1   ??? Hx of gastric ulcer Z87.19     Plan:   ?? On clear liquids  ?? On IV antibiotics  ?? Will obtain UGI series     Signed By: Joylene JohnWilliam C [place], PA     08/02/2017  1:50 PM       GI attending note:    I personally examined the patient. Agree with the physical, assessment, and plan of the APP. Negative UGI/CT abd for fistula. Dr. Tally Joealreja to follow tomorrow.    Dr. Dagmar Haithanjan

## 2017-08-02 NOTE — Progress Notes (Signed)
Bedside shift change report given to Roxanne (Cabin crewoncoming nurse) by Lanora ManisElizabeth  (offgoing nurse). Report included the following information SBAR, Kardex, Procedure Summary and Intake/Output.

## 2017-08-02 NOTE — Progress Notes (Addendum)
General Surgery Daily Progress Note  Admit Date: 07/29/2017  Post-Operative Day: * No surgery found * from * No surgery found *     Subjective:     Last 24 hrs: Pt is w/o complaints.  On rocephin and flagyl.  On clear liq       Objective:     Blood pressure 151/89, pulse 72, temperature 97.6 ??F (36.4 ??C), resp. rate 16, weight 201 lb 11.5 oz (91.5 kg), SpO2 95 %, not currently breastfeeding.  Temp (24hrs), Avg:97.3 ??F (36.3 ??C), Min:96.3 ??F (35.7 ??C), Max:97.9 ??F (36.6 ??C)      _____________________  Physical Exam:     Alert and Oriented, x3, in no acute distress.  Cardiovascular: RRR, no peripheral edema  Abdomen: soft, nl BS, drain in ant abdominal area - brown drainage in bag      Assessment:   Active Problems:    Intra-abdominal abscess (HCC) (07/29/2017)      Hx of gastric ulcer (07/29/2017)            Plan:     Cont abx  ? Adv diet  Re CT at some point to assess resolution - will d/w Dr Dyke MaesShindel  Gi ppx    Data Review:    Recent Labs     08/02/17  0830 08/01/17  0335 07/31/17  0030   WBC 5.9 5.2 6.2   HGB 11.5 11.6 11.3*   HCT 36.4 36.9 36.0   PLT 280 280 301     Recent Labs     08/02/17  0700 08/01/17  0335 07/31/17  0030   NA 139 140 138   K 4.0 3.5 3.8   CL 106 103 103   CO2 20* 27 24   GLU 165* 151* 101*   BUN 9 9 9    CREA 0.46* 0.41* 0.54*   CA 8.4* 8.6 8.2*   MG  --   --  1.6   PHOS  --   --  3.9     No results for input(s): AML, LPSE in the last 72 hours.        ______________________  Medications:    Current Facility-Administered Medications   Medication Dose Route Frequency   ??? TPN ADULT - PERIPHERAL   IntraVENous CONTINUOUS   ??? lactated Ringers infusion  50 mL/hr IntraVENous CONTINUOUS   ??? metroNIDAZOLE (FLAGYL) IVPB premix 500 mg  500 mg IntraVENous Q12H   ??? sertraline (ZOLOFT) tablet 100 mg  100 mg Oral DAILY   ??? sodium chloride (NS) flush 5-10 mL  5-10 mL IntraVENous Q8H   ??? sodium chloride (NS) flush 5-10 mL  5-10 mL IntraVENous PRN   ??? glucose chewable tablet 16 g  4 Tab Oral PRN    ??? dextrose (D50W) injection syrg 12.5-25 g  12.5-25 g IntraVENous PRN   ??? glucagon (GLUCAGEN) injection 1 mg  1 mg IntraMUSCular PRN   ??? pantoprazole (PROTONIX) 40 mg in sodium chloride 0.9% 10 mL injection  40 mg IntraVENous Q12H   ??? ondansetron (ZOFRAN) injection 4 mg  4 mg IntraVENous Q6H PRN   ??? diphenhydrAMINE (BENADRYL) 12.5 mg/5 mL oral elixir 12.5 mg  12.5 mg Oral Q6H PRN   ??? HYDROmorphone (DILAUDID) injection 1 mg  1 mg IntraVENous Q3H PRN   ??? amLODIPine (NORVASC) tablet 5 mg  5 mg Oral DAILY   ??? triamterene-hydroCHLOROthiazide (MAXZIDE) 37.5-25 mg per tablet 1 Tab  1 Tab Oral DAILY   ??? LORazepam (ATIVAN) tablet 1 mg  1 mg Oral Q8H PRN   ??? lactated Ringers infusion  100 mL/hr IntraVENous CONTINUOUS   ??? cefTRIAXone (ROCEPHIN) 1 g in 0.9% sodium chloride (MBP/ADV) 50 mL  1 g IntraVENous Q12H   ??? acetaminophen (TYLENOL) tablet 650 mg  650 mg Oral Q4H PRN   ??? insulin lispro (HUMALOG) injection   SubCUTAneous AC&HS   ??? zolpidem (AMBIEN) tablet 5 mg  5 mg Oral QHS       Berneice GandyMary S Rogers, NP  08/02/2017      Pt seen and examined  Agree with above  No new complaints.   Still having 25-30 out of the drain.  Gen- Alert in NAD  Lungs- CTA  H-RRR   Abd- S/nt/nd Small amount of skin breakdown near the drain.   Drain purulent- no sign of enteric content  D/w Dr.Thanjan who would like UGI to r/u fistula- If this is negative will advance diet to fulls.

## 2017-08-02 NOTE — Progress Notes (Signed)
NUTRITION COMPLETE ASSESSMENT    RECOMMENDATIONS:   1. If to remain on clear liquids today increase PPN to better meet protein needs:  4.25%AA, D10 @ 52m/hr   - If unable to advance diet to full liquids in 48hrs recommend PICC with full TPN (see below for recommendations)    2. Diet advancement per surgery + Ensure Enlive BID  3. Daily MVI  4. Weigh daily on standing scale     Interventions/Plan:   Food/Nutrient Delivery:    Commercial supplement(Ensure Clear TID)     Modify rate, concentration, composition, and schedule    Assessment:   Reason for Assessment: [x] Other: PPN    Diet: Clear liquids  Supplements: none  PPN: 4.25%AA, D10 @ 48mhr  Nutritionally Significant Medications: [x]  Reviewed & Includes: ceftriaxone, SSI, flagyl, protonix, zoloft, LR @ 1005mr, LR @ 93m32m   Meal Intake:   Patient Vitals for the past 100 hrs:   % Diet Eaten   07/29/17 1914 100 %   07/29/17 1145 0 %     Pre-Hospitalization:  Usual Appetite: Good  Diet at Home: regular  Vitamins/Supplements: No  Subjective: "I haven't had any nutrition issues, I was eating good before."    Objective:  Pt admitted for peritoneal abscess. PMHx: gastric ulcer with perforation, DM, HTN, hypercholesterolemia. Recent admit for same with ulcer repair about 9 weeks ago (05/2017). Readmitted for abscess with drainage around drain site. Surgery following. S/p drain adjustment on 11/24 with perforation of colon at that time. Sinogram negative for fistula per GI notes.     PPN started on 11/25 with clear liquids started yesterday. PPN providing: 510kcal, 43g protein. Pt agreeable to trying Ensure Clear so will add TID for 720kcal, 24g protein.   - If to remain on clear liquids today increase PPN to better meet protein needs:  4.25%AA, D10 @ 83ml60m[1020kcal, 85g protein]   - If unable to advance diet to full liquids in 48hrs recommend PICC with full TPN: 5%AA, D15 @ 90ml/45m 293ml, 77mlipids 3x/week [1748kcal, 108g protein]     Unsure if current wt is accurate since bedscale used. If accurate indicates severe wt loss of 14% x 2 weeks noted, 19% x 4 months.   Wt Readings from Last 10 Encounters:   08/02/17 91.5 kg (201 lb 11.5 oz)   07/29/17 100 kg (220 lb 8 oz)   07/17/17 106 kg (233 lb 11 oz)   07/14/17 106 kg (233 lb 9.6 oz)   07/01/17 105.2 kg (232 lb)   06/09/17 107 kg (236 lb)   05/24/17 105.2 kg (232 lb)   05/23/17 106.6 kg (235 lb)   05/16/17 115.3 kg (254 lb 3.1 oz)   03/17/17 113.4 kg (250 lb)     Will continue to follow diet advancement vs TPN, PO intake and supplements, wt trends.   Estimated Nutrition Needs:   Kcals/day: 1760 Kc7893day(1613-1760kcal)  Protein: 110 g(1.2g/kg)  Fluid: 1800 ml(1ml/kca3m Based On: [place] St Jeor(x 1.1-1.2)  Weight Used: Actual wt(91.5kg)    Pt expected to meet estimated nutrient needs:  []    Yes     [x]   No - with current PPN/diet []  Unable to predict at this time  Nutrition Diagnosis:   1. Inadequate protein-energy intake related to altered GI fx as evidenced by peritoneal abcess s/p colon perf from drain; clear liquids w/ PPN'; ?severe wt loss x 4 months  Goals:     TPN/PO to meet at least 90% needs until able to tolerate  at least 60% needs orally; controlled wt loss of 1-2# per week     Monitoring & Evaluation:    - Total energy intake, Liquid meal replacement, Enteral/parenteral nutrition intake   - Weight/weight change, GI    Previous Nutrition Goals Met:   N/A  Previous Recommendations:    N/A    Education & Discharge Needs:   [x]  None Identified   []  Identified and addressed    []  Participated in care plan, discharge planning, and/or interdisciplinary rounds        Cultural, religious and ethnic food preferences identified: None    Skin Integrity: [x] Intact  [] Other  Edema: [x] None [] Other  Last BM: 11/27 - loose  Food Allergies: [x] None [] Other  Diet Restrictions: Cultural/Religious Preference(s): None      Anthropometrics:     Weight Loss Metrics 08/02/2017 07/29/2017 07/29/2017 07/29/2017 07/17/2017 07/14/2017 07/01/2017   Today's Wt 201 lb 11.5 oz - 220 lb 8 oz - 233 lb 11 oz 233 lb 9.6 oz 232 lb   BMI - 34.63 kg/m2 - 37.85 kg/m2 40.11 kg/m2 40.1 kg/m2 39.82 kg/m2      Weight Source: Bed  Height: 5' 4"  (162.6 cm),    Body mass index is 34.63 kg/m??.   ,     ,      Labs:    Lab Results   Component Value Date/Time    Sodium 139 08/02/2017 07:00 AM    Potassium 4.0 08/02/2017 07:00 AM    Chloride 106 08/02/2017 07:00 AM    CO2 20 (L) 08/02/2017 07:00 AM    Glucose 165 (H) 08/02/2017 07:00 AM    BUN 9 08/02/2017 07:00 AM    Creatinine 0.46 (L) 08/02/2017 07:00 AM    Calcium 8.4 (L) 08/02/2017 07:00 AM    Magnesium 1.6 07/31/2017 12:30 AM    Phosphorus 3.9 07/31/2017 12:30 AM    Albumin 2.4 (L) 07/30/2017 02:50 AM     Lab Results   Component Value Date/Time    Hemoglobin A1c 7.3 (H) 05/13/2017 03:04 AM    Hemoglobin A1c (POC) 8.4 (A) 07/01/2017 02:50 PM     Dorette Grate, RD Pager [phone] or [phone]

## 2017-08-02 NOTE — Progress Notes (Signed)
Medical Progress Note      NAME: Dana [name]   DOB:  11-14-1955  MRM:  [id number]    Date/Time: 08/02/2017  8:51 AM    Problem List:   Active Problems:    Intra-abdominal abscess (HCC) (07/29/2017)      Hx of gastric ulcer (07/29/2017)           Subjective:     Patient feeling ok.    Past Medical History:   Diagnosis Date   ??? Diabetes (HCC) 01/24/2013   ??? GERD (gastroesophageal reflux disease)    ??? Hives    ??? HTN (hypertension)    ??? Hypercholesterolemia    ??? Perforated gastric ulcer (HCC)        ROS:  General: negative for fever, chills, sweats, weakness  Respiratory:  negative for cough, sputum production, SOB, wheezing, DOE, pleuritic pain  Cardiology:  negative for chest pain, palpitations, orthopnea, PND, edema, syncope   Gastrointestinal: negative for abdominal pain, N/V, dysphagia, change in bowel habits, bleeding         Objective:       Vitals:          Last 24hrs VS reviewed since prior progress note. Most recent are:    Visit Vitals  BP 151/89 (BP 1 Location: Right arm, BP Patient Position: Head of bed elevated (Comment degrees))   Pulse 72   Temp 97.6 ??F (36.4 ??C)   Resp 16   Wt 201 lb 11.5 oz (91.5 kg)   SpO2 95%   Breastfeeding? No   BMI 34.63 kg/m??     SpO2 Readings from Last 6 Encounters:   08/02/17 95%   07/29/17 94%   07/26/17 96%   07/14/17 96%   06/09/17 98%   05/23/17 98%    O2 Flow Rate (L/min): 3 l/min       Intake/Output Summary (Last 24 hours) at 08/02/2017 0851  Last data filed at 08/02/2017 [id number]  Gross per 24 hour   Intake ???   Output 55 ml   Net -55 ml          Exam:     General   well developed, well nourished, appears stated age, in no acute distress  Respiratory   Clear To Auscultation bilaterally - no wheezes, rales, rhonchi, or crackles  Cardiology  Regular Rate   Abdominal  Soft, non-tender, non-distended, positive bowel sounds, perc cath covered with dressing. Draining.  Extremities  No clubbing, cyanosis, or edema. Pulses intact.    Lab Data Reviewed: (see below)     Medications Reviewed: (see below)    ______________________________________________________________________    Medications:     Current Facility-Administered Medications   Medication Dose Route Frequency   ??? TPN ADULT - PERIPHERAL   IntraVENous CONTINUOUS   ??? potassium chloride SR (KLOR-CON 10) tablet 20 mEq  20 mEq Oral DAILY   ??? lactated Ringers infusion  50 mL/hr IntraVENous CONTINUOUS   ??? metroNIDAZOLE (FLAGYL) IVPB premix 500 mg  500 mg IntraVENous Q12H   ??? sertraline (ZOLOFT) tablet 100 mg  100 mg Oral DAILY   ??? sodium chloride (NS) flush 5-10 mL  5-10 mL IntraVENous Q8H   ??? sodium chloride (NS) flush 5-10 mL  5-10 mL IntraVENous PRN   ??? glucose chewable tablet 16 g  4 Tab Oral PRN   ??? dextrose (D50W) injection syrg 12.5-25 g  12.5-25 g IntraVENous PRN   ??? glucagon (GLUCAGEN) injection 1 mg  1 mg IntraMUSCular PRN   ???  pantoprazole (PROTONIX) 40 mg in sodium chloride 0.9% 10 mL injection  40 mg IntraVENous Q12H   ??? ondansetron (ZOFRAN) injection 4 mg  4 mg IntraVENous Q6H PRN   ??? diphenhydrAMINE (BENADRYL) 12.5 mg/5 mL oral elixir 12.5 mg  12.5 mg Oral Q6H PRN   ??? HYDROmorphone (DILAUDID) injection 1 mg  1 mg IntraVENous Q3H PRN   ??? amLODIPine (NORVASC) tablet 5 mg  5 mg Oral DAILY   ??? triamterene-hydroCHLOROthiazide (MAXZIDE) 37.5-25 mg per tablet 1 Tab  1 Tab Oral DAILY   ??? LORazepam (ATIVAN) tablet 1 mg  1 mg Oral Q8H PRN   ??? lactated Ringers infusion  100 mL/hr IntraVENous CONTINUOUS   ??? cefTRIAXone (ROCEPHIN) 1 g in 0.9% sodium chloride (MBP/ADV) 50 mL  1 g IntraVENous Q12H   ??? acetaminophen (TYLENOL) tablet 650 mg  650 mg Oral Q4H PRN   ??? insulin lispro (HUMALOG) injection   SubCUTAneous AC&HS   ??? zolpidem (AMBIEN) tablet 5 mg  5 mg Oral QHS            Lab Review:     Recent Labs     08/01/17  0335 07/31/17  0030   WBC 5.2 6.2   HGB 11.6 11.3*   HCT 36.9 36.0   PLT 280 301     Recent Labs     08/02/17  0700 08/01/17  0335 07/31/17  0030   NA 139 140 138   K 4.0 3.5 3.8   CL 106 103 103   CO2 20* 27 24    GLU 165* 151* 101*   BUN 9 9 9    CREA 0.46* 0.41* 0.54*   CA 8.4* 8.6 8.2*   MG  --   --  1.6   PHOS  --   --  3.9     Lab Results   Component Value Date/Time    Glucose (POC) 170 (H) 08/01/2017 09:31 PM    Glucose (POC) 161 (H) 08/01/2017 04:24 PM    Glucose (POC) 154 (H) 08/01/2017 11:31 AM    Glucose (POC) 161 (H) 08/01/2017 06:07 AM    Glucose (POC) 130 (H) 07/31/2017 10:28 PM     No results for input(s): PH, PCO2, PO2, HCO3, FIO2 in the last 72 hours.  No results for input(s): INR in the last 72 hours.    No lab exists for component: INREXT    Other pertinent lab: NA         Assessment:     Patient Active Problem List   Diagnosis Code   ??? Hypercholesterolemia E78.00   ??? Diabetes (HCC) E11.9   ??? Sleep apnea G47.30   ??? Hypertension complicating diabetes (HCC) E11.59, I10   ??? Morbid obesity due to excess calories (HCC) E66.01   ??? Hives L50.9   ??? Acute gastric ulcer with perforation (HCC) K25.1   ??? Arthralgia of both knees M25.561, M25.562   ??? Abdominal pain R10.9   ??? Peritoneal abscess (HCC) K65.1   ??? Intra-abdominal abscess (HCC) K65.1   ??? Hx of gastric ulcer Z87.19          Plan:                 1. abd abscess- s/p perc drainage, continue abx  2. Diabetes- add lantus when eating                ___________________________________________________    Attending Physician: Lake BellsNancy J Urbano Milhouse, MD

## 2017-08-02 NOTE — Progress Notes (Signed)
Spiritual Care Partner Volunteer visited patient in Rm 518 on 08/02/2017.  Documented by:  Clancy GourdLarry Johnson, Chaplain, MDiv, MS, BCC  [phone]

## 2017-08-02 NOTE — Procedures (Signed)
PICC Placement Note    PRE-PROCEDURE VERIFICATION    1335: Order for PICC verified. Consent obtained from patient after risks, benefits, and procedure was explained. Time given to answer questions and/or concerns.    Correct Procedure: yes  Correct Site:  yes  Temperature: Temp: 97.8 F (36.6 C), Temperature Source: Temp Source: Oral  Recent Labs     08/02/17  0830 08/02/17  0700   BUN  --  9   CREA  --  0.46*   PLT 280  --    WBC 5.9  --      Allergies: Adhesive and Dolobid [diflunisal]  Education materials, including PICC Booklet, for PICC Care given to patient: yes.   See Patient Education activity for further details.    PROCEDURE DETAIL  A double lumen PICC line was started for vascular access, TPN, desire for reliable access and nurse/physician request. The following documentation is in addition to the PICC properties in the lines/airways flowsheet :  Lot #: [id number]  Was xylocaine 1% used intradermally:  yes  Catheter Length: 38 cm  External Catheter Length: 0cm  Vein Selection for PICC: right brachial  Central Line Bundle followed yes  Complication Related to Insertion: none    The placement was verified by ECG/[employer]:  The tip location is in the superior vena cava. See ECG results for PICC tip placement.    Report given to [facility], [place].    Line is okay to use.    Caprice Beaver, BSN, RN, VA-BC  Vascular Access Team

## 2017-08-03 ENCOUNTER — Inpatient Hospital Stay: Admit: 2017-08-03 | Payer: BLUE CROSS/BLUE SHIELD | Primary: Internal Medicine

## 2017-08-03 LAB — GLUCOSE, POC
Glucose (POC): 178 mg/dL — ABNORMAL HIGH (ref 65–100)
Glucose (POC): 193 mg/dL — ABNORMAL HIGH (ref 65–100)
Glucose (POC): 282 mg/dL — ABNORMAL HIGH (ref 65–100)
Glucose (POC): 131 mg/dL — ABNORMAL HIGH (ref 65–100)

## 2017-08-03 MED ORDER — IOHEXOL 350 MG IODINE/ML INTRAVENOUS SOLUTION
350 mg iodine/mL | Freq: Once | INTRAVENOUS | Status: AC
Start: 2017-08-03 — End: 2017-08-03
  Administered 2017-08-03: 19:00:00 via ORAL

## 2017-08-03 MED ORDER — INSULIN GLARGINE 100 UNIT/ML INJECTION
100 unit/mL | Freq: Every day | SUBCUTANEOUS | Status: DC
Start: 2017-08-03 — End: 2017-08-04
  Administered 2017-08-03 – 2017-08-04 (×2): via SUBCUTANEOUS

## 2017-08-03 MED ORDER — METRONIDAZOLE 250 MG TAB
250 mg | Freq: Two times a day (BID) | ORAL | Status: DC
Start: 2017-08-03 — End: 2017-08-11
  Administered 2017-08-04 – 2017-08-11 (×17): via ORAL

## 2017-08-03 MED ORDER — PANTOPRAZOLE 40 MG TAB, DELAYED RELEASE
40 mg | Freq: Two times a day (BID) | ORAL | Status: DC
Start: 2017-08-03 — End: 2017-08-11
  Administered 2017-08-04 – 2017-08-11 (×16): via ORAL

## 2017-08-03 MED FILL — AMLODIPINE 5 MG TAB: 5 mg | ORAL | Qty: 1

## 2017-08-03 MED FILL — ACETAMINOPHEN 325 MG TABLET: 325 mg | ORAL | Qty: 2

## 2017-08-03 MED FILL — PROTONIX 40 MG INTRAVENOUS SOLUTION: 40 mg | INTRAVENOUS | Qty: 40

## 2017-08-03 MED FILL — NORMAL SALINE FLUSH 0.9 % INJECTION SYRINGE: INTRAMUSCULAR | Qty: 10

## 2017-08-03 MED FILL — ZOLPIDEM 5 MG TAB: 5 mg | ORAL | Qty: 1

## 2017-08-03 MED FILL — AMINOSYN II 10 % IV: 10 % | INTRAVENOUS | Qty: 765

## 2017-08-03 MED FILL — LANTUS U-100 INSULIN 100 UNIT/ML SUBCUTANEOUS SOLUTION: 100 unit/mL | SUBCUTANEOUS | Qty: 0.1

## 2017-08-03 MED FILL — INSULIN LISPRO 100 UNIT/ML INJECTION: 100 unit/mL | SUBCUTANEOUS | Qty: 1

## 2017-08-03 MED FILL — OMNIPAQUE 350 MG IODINE/ML INTRAVENOUS SOLUTION: 350 mg iodine/mL | INTRAVENOUS | Qty: 100

## 2017-08-03 MED FILL — METRO I.V. 500 MG/100 ML INTRAVENOUS PIGGYBACK: 500 mg/100 mL | INTRAVENOUS | Qty: 100

## 2017-08-03 MED FILL — CEFTRIAXONE 1 GRAM SOLUTION FOR INJECTION: 1 gram | INTRAMUSCULAR | Qty: 1

## 2017-08-03 MED FILL — TRIAMTERENE-HYDROCHLOROTHIAZIDE 37.5 MG-25 MG TAB: ORAL | Qty: 1

## 2017-08-03 MED FILL — SERTRALINE 50 MG TAB: 50 mg | ORAL | Qty: 2

## 2017-08-03 NOTE — Progress Notes (Signed)
Medical Progress Note      NAME: Dana [name]   DOB:  02/08/1956  MRM:  [id number]    Date/Time: 08/03/2017  6:59 AM    Problem List:   Active Problems:    Intra-abdominal abscess (HCC) (07/29/2017)      Hx of gastric ulcer (07/29/2017)           Subjective:     Patient c/o some pain at cath site    Past Medical History:   Diagnosis Date   ??? Diabetes (HCC) 01/24/2013   ??? GERD (gastroesophageal reflux disease)    ??? Hives    ??? HTN (hypertension)    ??? Hypercholesterolemia    ??? Perforated gastric ulcer (HCC)        ROS:  General: negative for fever, chills, sweats, weakness  Respiratory:  negative for cough, sputum production, SOB, wheezing, DOE, pleuritic pain  Cardiology:  negative for chest pain, palpitations, orthopnea, PND, edema, syncope   Gastrointestinal: positive for minimal discomfort at catheter site         Objective:       Vitals:          Last 24hrs VS reviewed since prior progress note. Most recent are:    Visit Vitals  BP (!) 156/102 (BP 1 Location: Left arm, BP Patient Position: At rest)   Pulse 82   Temp 98.2 ??F (36.8 ??C)   Resp 16   Ht 5\' 4"  (1.626 m)   Wt 201 lb 11.5 oz (91.5 kg)   SpO2 93%   Breastfeeding? No   BMI 34.63 kg/m??     SpO2 Readings from Last 6 Encounters:   08/03/17 93%   07/29/17 94%   07/26/17 96%   07/14/17 96%   06/09/17 98%   05/23/17 98%    O2 Flow Rate (L/min): 3 l/min       Intake/Output Summary (Last 24 hours) at 08/03/2017 [id number]  Last data filed at 08/02/2017 2002  Gross per 24 hour   Intake ???   Output 40 ml   Net -40 ml          Exam:     General   well developed, well nourished, appears stated age, in no acute distress  Respiratory   Clear To Auscultation bilaterally - no wheezes, rales, rhonchi, or crackles  Cardiology  Regular Rate and Rythmn  - no murmurs, rubs or gallops  Abdominal  Perc cath in place abdomen soft  Extremities  No clubbing, cyanosis, or edema. Pulses intact.    Lab Data Reviewed: (see below)    Medications Reviewed: (see below)     ______________________________________________________________________    Medications:     Current Facility-Administered Medications   Medication Dose Route Frequency   ??? insulin glargine (LANTUS) injection 10 Units  10 Units SubCUTAneous DAILY   ??? TPN ADULT - PERIPHERAL   IntraVENous CONTINUOUS   ??? TPN ADULT - PERIPHERAL   IntraVENous CONTINUOUS   ??? lactated Ringers infusion  50 mL/hr IntraVENous CONTINUOUS   ??? metroNIDAZOLE (FLAGYL) IVPB premix 500 mg  500 mg IntraVENous Q12H   ??? sertraline (ZOLOFT) tablet 100 mg  100 mg Oral DAILY   ??? sodium chloride (NS) flush 5-10 mL  5-10 mL IntraVENous Q8H   ??? sodium chloride (NS) flush 5-10 mL  5-10 mL IntraVENous PRN   ??? glucose chewable tablet 16 g  4 Tab Oral PRN   ??? dextrose (D50W) injection syrg 12.5-25 g  12.5-25 g IntraVENous PRN   ???  glucagon (GLUCAGEN) injection 1 mg  1 mg IntraMUSCular PRN   ??? pantoprazole (PROTONIX) 40 mg in sodium chloride 0.9% 10 mL injection  40 mg IntraVENous Q12H   ??? ondansetron (ZOFRAN) injection 4 mg  4 mg IntraVENous Q6H PRN   ??? diphenhydrAMINE (BENADRYL) 12.5 mg/5 mL oral elixir 12.5 mg  12.5 mg Oral Q6H PRN   ??? HYDROmorphone (DILAUDID) injection 1 mg  1 mg IntraVENous Q3H PRN   ??? amLODIPine (NORVASC) tablet 5 mg  5 mg Oral DAILY   ??? triamterene-hydroCHLOROthiazide (MAXZIDE) 37.5-25 mg per tablet 1 Tab  1 Tab Oral DAILY   ??? LORazepam (ATIVAN) tablet 1 mg  1 mg Oral Q8H PRN   ??? lactated Ringers infusion  100 mL/hr IntraVENous CONTINUOUS   ??? cefTRIAXone (ROCEPHIN) 1 g in 0.9% sodium chloride (MBP/ADV) 50 mL  1 g IntraVENous Q12H   ??? acetaminophen (TYLENOL) tablet 650 mg  650 mg Oral Q4H PRN   ??? insulin lispro (HUMALOG) injection   SubCUTAneous AC&HS   ??? zolpidem (AMBIEN) tablet 5 mg  5 mg Oral QHS            Lab Review:     Recent Labs     08/02/17  0830 08/01/17  0335   WBC 5.9 5.2   HGB 11.5 11.6   HCT 36.4 36.9   PLT 280 280     Recent Labs     08/02/17  0700 08/01/17  0335   NA 139 140   K 4.0 3.5   CL 106 103   CO2 20* 27    GLU 165* 151*   BUN 9 9   CREA 0.46* 0.41*   CA 8.4* 8.6     Lab Results   Component Value Date/Time    Glucose (POC) 193 (H) 08/03/2017 06:22 AM    Glucose (POC) 178 (H) 08/02/2017 10:47 PM    Glucose (POC) 149 (H) 08/02/2017 04:02 PM    Glucose (POC) 203 (H) 08/02/2017 11:57 AM    Glucose (POC) 170 (H) 08/01/2017 09:31 PM     No results for input(s): PH, PCO2, PO2, HCO3, FIO2 in the last 72 hours.  No results for input(s): INR in the last 72 hours.    No lab exists for component: INREXT    Other pertinent lab: NA         Assessment:     Patient Active Problem List   Diagnosis Code   ??? Hypercholesterolemia E78.00   ??? Diabetes (HCC) E11.9   ??? Sleep apnea G47.30   ??? Hypertension complicating diabetes (HCC) E11.59, I10   ??? Morbid obesity due to excess calories (HCC) E66.01   ??? Hives L50.9   ??? Acute gastric ulcer with perforation (HCC) K25.1   ??? Arthralgia of both knees M25.561, M25.562   ??? Abdominal pain R10.9   ??? Peritoneal abscess (HCC) K65.1   ??? Intra-abdominal abscess (HCC) K65.1   ??? Hx of gastric ulcer Z87.19          Plan:                 1. intrabd abscess- s/p perc drainage  2. Diabetes- add back lantus  3. Hypertension- continue meds                ___________________________________________________    Attending Physician: Lake BellsNancy J Carsten Carstarphen, MD

## 2017-08-03 NOTE — Progress Notes (Signed)
Bedside shift change report given to Leandro Reasoneroxanne, Charity fundraiserN (Cabin crewoncoming nurse) by Carlena SaxBlair, RN (offgoing nurse). Report included the following information SBAR, Kardex, OR Summary, Intake/Output, MAR and Recent Results.

## 2017-08-03 NOTE — Progress Notes (Signed)
Reviewed chart and patient remains on clears and IV ABX. Per Dr. Dagmar Haithanjan a UGI series will be obtained. CM continuing to follow for assessment of discharge needs. Patient will need resumption of home health orders at discharge.     [phone]

## 2017-08-03 NOTE — Progress Notes (Signed)
Bedside and Verbal shift change report given to Carlena SaxBlair (Cabin crewoncoming nurse) by Nelly Laurenceoxanne RN (offgoing nurse). Report included the following information SBAR.

## 2017-08-03 NOTE — Wound Image (Signed)
WOCN Note:     Follow-up visit for pigtail drain site with periwound erosion/MASD.   ??  Chart shows:  Laparotomy exploratory perforated posterior ulcer repair over sew with patch on 05/12/2017. She was admitted on 07/02/2017 for abdominal pain and 07/17/2017 for peritoneal abscess. 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 She was last discharged home on 07/26/2017. She presents today with complaints of pus draining from around her pigtail drain site  Pigtail drain exchange 07/29/17 with colon perforation.   Awaiting sinogram results today.   ??  Assessment:   Patient is A&O x??4, continent and mobile and continent.   Patient reports??tenderness to skin around drain site but less so than before.   ??  1. POA,??central abdominal drain site with redness immediate to margins but much reduced skin irritation than before. Some induration.  Creamy tan effluent in equal amounts from both around drain and second hole as well.    Remnants of Marathon remain.   Marathon applied to protect skin and dry cover dressing applied.  ??  Recommendations:    Continue to use Marathon skin protectant as needed every 2-3 days after it flakes off around drain and leaking adjacent site.   ??  Discussed above plan with patient.  ??  Transition of Care: Plan to follow weekly and as needed while admitted to hospital.     Rodman PickleJulie Frankie Zito, BSN, RN, [facility]OCN  Certified Wound, Ostomy, Continence Nurse  office [phone]  pager [phone] or call operator to page

## 2017-08-03 NOTE — Progress Notes (Signed)
NUTRITION brief  Recommendations:   1. Document all PO intake with adjustment to PN as needed    - pt with good appetite on past admit when on diet   - if intake poor or not able to tolerate diet may need to increase to full TPN  2. Monitor BG with insulin adjustment PRN  3. Daily weights while on TPN     Diet: GI lite  Supplements: Ensure Clear TID  PPN: 4.25%AA, D10 @ 7575ml/hr  Medications: ceftriaxone, lantus (10 units), SSI, flagyl, zoloft, LR @ 11300ml/hr, LR @ 5250ml/hr     Chart reviewed for PN check. UGI and sinogram negative. Plans to continue drain for now per surgery notes. PICC line placed yesterday. PPN increased to provide: 979kcal, 77g protein, however happy to see that diet also advanced to GI lite by surgery.     Since diet advanced will switch supplements to Glucerna TID (660kcal, 30g protein) which has lower CHO content than Ensure Clear since BG elevated with PPN. Diet adjusted to just low fiber since low fat, low lactose not indicated at this time. Hope that intake will be good with diet. If no may need to increase to full TPN, goal would be: 5%AA, D15 @ 3790ml/hr + 250ml, 20% lipids 3x/week.     Will continue to follow for PO intake, supplement acceptance, BG and PN adjustment. See previous note for goals and monitoring/evaluation.  Estimated Nutrition Needs:   Kcals/day: 1760 Kcals/day(1613-1760kcal)  Protein: 110 g(1.2g/kg)  Fluid: 1800 ml(491ml/kcal)  Based On: [place] Jeor(x 1.1-1.2)  Weight Used: Actual wt(91.5kg)    Leone PayorJulia E Levana Minetti, RD

## 2017-08-03 NOTE — Progress Notes (Addendum)
[place]   [facility]  [address]  [place], [place] [id number]       GI PROGRESS NOTE  Will Broadus JohnWarren, [place]-[phone] office  [phone] NP/PA in-hospital cell phone M-F until 4:30PM  After 5PM or on weekends, please call operator for physician on call    NAME: Dana [name]   DOB:  [birth year]   MRN:  [id number]       Subjective:   Patient is sitting upright in bed and appears comfortable.  No nausea, vomiting, or abdominal pain.  She reports some discomfort at the drain site.     Objective:     VITALS:   Last 24hrs VS reviewed since prior progress note. Most recent are:  Visit Vitals  BP (!) 138/92 (BP 1 Location: Left arm, BP Patient Position: Head of bed elevated (Comment degrees))   Pulse 79   Temp 97.7 ??F (36.5 ??C)   Resp 16   Ht 5\' 4"  (1.626 m)   Wt 99.9 kg (220 lb 3.2 oz)   SpO2 95%   Breastfeeding? No   BMI 37.80 kg/m??       PHYSICAL EXAM:  General: Cooperative, no acute distress????  Neurologic:?? Alert and oriented  HEENT: EOMI, no scleral icterus   Lungs:  CTA bilaterally anteriorly  Heart:  S1 S2  Abdomen: Soft, non-distended, no tenderness, no guarding, no rebound. +Bowel sounds. New dressing in place. Drain in place with purulent drainage.   Extremities: Warm  Psych:???? Not anxious or agitated    Lab Data Reviewed:     Recent Results (from the past 24 hour(s))   GLUCOSE, POC    Collection Time: 08/02/17  4:02 PM   Result Value Ref Range    Glucose (POC) 149 (H) 65 - 100 mg/dL    Performed by Vinetta Bergamoarrington  Tameisha    GLUCOSE, POC    Collection Time: 08/02/17 10:47 PM   Result Value Ref Range    Glucose (POC) 178 (H) 65 - 100 mg/dL    Performed by Clarise CruzPopejoy  Roxanne    GLUCOSE, POC    Collection Time: 08/03/17  6:22 AM   Result Value Ref Range    Glucose (POC) 193 (H) 65 - 100 mg/dL    Performed by Clarise CruzPopejoy  Roxanne    GLUCOSE, POC    Collection Time: 08/03/17 11:46 AM   Result Value Ref Range    Glucose (POC) 282 (H) 65 - 100 mg/dL    Performed by Reed BreechJACKSON TONIA        Assessment:    ?? Intra-abdominal abscess: drainage catheter in place, on antibiotics. UGI series (08/02/17): no definite extravasation identified. CT abdomen without contrast (08/02/17): no evidence for an ulcer or leak; abscess now measures no greater than 2 cm with pigtail drain in place.  ?? History of perforated posterior prepyloric ulcer with repair (05/13/17)  ?? Obstructive sleep apnea  ?? Morbid obesity     Patient Active Problem List   Diagnosis Code   ??? Hypercholesterolemia E78.00   ??? Diabetes (HCC) E11.9   ??? Sleep apnea G47.30   ??? Hypertension complicating diabetes (HCC) E11.59, I10   ??? Morbid obesity due to excess calories (HCC) E66.01   ??? Hives L50.9   ??? Acute gastric ulcer with perforation (HCC) K25.1   ??? Arthralgia of both knees M25.561, M25.562   ??? Abdominal pain R10.9   ??? Peritoneal abscess (HCC) K65.1   ??? Intra-abdominal abscess (HCC) K65.1   ??? Hx of gastric ulcer  Z87.19     Plan:   ?? Regular diet was ordered for this afternoon  ?? PPI  ?? On IV antibiotics  ?? Plan for EGD on Friday with Dr. Tally Joealreja. Patient is in agreement to proceed.      Signed By: Joylene JohnWilliam C Warren, PA     08/03/2017  12:24 PM       GI Attending: Patient seen and examined on 11/28 by me. I discussed radiological findings with her and that the indication for EGD would be to evaluate her underlying ulcer disease and also to see if there is a persistent fistula seen on the gastric side that is perhaps not appreciated on the UGI series, but is repeatedly seen on CT. I gave her the option of doing this procedure now vs waiting and continuing observation for now. She has expressed that she would like to have endoscopy at this time. Risks were discussed and she expressed understanding. Plan for EGD with possible over the scope clip placement on Friday at 730 AM.    Ulus Hazen P. Tally Joealreja, MD

## 2017-08-03 NOTE — Progress Notes (Addendum)
General Surgery Daily Progress Note  Admit Date: 07/29/2017  Post-Operative Day: * No surgery found * from * No surgery found *     Subjective:     Last 24 hrs: Wants the drain out. Otherwise feeling good. No nausea or vomiting.     Objective:     Blood pressure (!) 138/92, pulse 79, temperature 97.7 ??F (36.5 ??C), resp. rate 16, height 5\' 4"  (1.626 m), weight 220 lb 3.2 oz (99.9 kg), SpO2 95 %, not currently breastfeeding.  Temp (24hrs), Avg:97.8 ??F (36.6 ??C), Min:97.5 ??F (36.4 ??C), Max:98.2 ??F (36.8 ??C)      _____________________  Physical Exam:     Alert and Oriented x3, in no acute distress.  Cardiovascular: RRR, no peripheral edema  Lungs:CTAB   Abdomen: soft, drain with 20 cc purulent drainage, leaking when flushed, small breakdown posterior to drain site, sites indurated and hardened, active bowel sounds       Assessment:   Active Problems:    Intra-abdominal abscess (HCC) (07/29/2017)      Hx of gastric ulcer (07/29/2017)            Plan:     IR to check drain placement   OOB as tolerated  Pain management   Cont abx    Data Review:    Recent Labs     08/02/17  0830 08/01/17  0335   WBC 5.9 5.2   HGB 11.5 11.6   HCT 36.4 36.9   PLT 280 280     Recent Labs     08/02/17  0700 08/01/17  0335   NA 139 140   K 4.0 3.5   CL 106 103   CO2 20* 27   GLU 165* 151*   BUN 9 9   CREA 0.46* 0.41*   CA 8.4* 8.6     No results for input(s): AML, LPSE in the last 72 hours.        ______________________  Medications:    Current Facility-Administered Medications   Medication Dose Route Frequency   ??? insulin glargine (LANTUS) injection 10 Units  10 Units SubCUTAneous DAILY   ??? TPN ADULT - PERIPHERAL   IntraVENous CONTINUOUS   ??? TPN ADULT - PERIPHERAL   IntraVENous CONTINUOUS   ??? lactated Ringers infusion  50 mL/hr IntraVENous CONTINUOUS   ??? metroNIDAZOLE (FLAGYL) IVPB premix 500 mg  500 mg IntraVENous Q12H   ??? sertraline (ZOLOFT) tablet 100 mg  100 mg Oral DAILY   ??? sodium chloride (NS) flush 5-10 mL  5-10 mL IntraVENous Q8H    ??? sodium chloride (NS) flush 5-10 mL  5-10 mL IntraVENous PRN   ??? glucose chewable tablet 16 g  4 Tab Oral PRN   ??? dextrose (D50W) injection syrg 12.5-25 g  12.5-25 g IntraVENous PRN   ??? glucagon (GLUCAGEN) injection 1 mg  1 mg IntraMUSCular PRN   ??? pantoprazole (PROTONIX) 40 mg in sodium chloride 0.9% 10 mL injection  40 mg IntraVENous Q12H   ??? ondansetron (ZOFRAN) injection 4 mg  4 mg IntraVENous Q6H PRN   ??? diphenhydrAMINE (BENADRYL) 12.5 mg/5 mL oral elixir 12.5 mg  12.5 mg Oral Q6H PRN   ??? HYDROmorphone (DILAUDID) injection 1 mg  1 mg IntraVENous Q3H PRN   ??? amLODIPine (NORVASC) tablet 5 mg  5 mg Oral DAILY   ??? triamterene-hydroCHLOROthiazide (MAXZIDE) 37.5-25 mg per tablet 1 Tab  1 Tab Oral DAILY   ??? LORazepam (ATIVAN) tablet 1 mg  1 mg Oral  Q8H PRN   ??? lactated Ringers infusion  100 mL/hr IntraVENous CONTINUOUS   ??? cefTRIAXone (ROCEPHIN) 1 g in 0.9% sodium chloride (MBP/ADV) 50 mL  1 g IntraVENous Q12H   ??? acetaminophen (TYLENOL) tablet 650 mg  650 mg Oral Q4H PRN   ??? insulin lispro (HUMALOG) injection   SubCUTAneous AC&HS   ??? zolpidem (AMBIEN) tablet 5 mg  5 mg Oral QHS       Symone M Hopkins  08/03/2017      Agree w/ assessment and plan  D/w Dr Amada JupiterShindel - will get sinogram to assess drain position        Pt seen and examined  Agree with above  She complains of some pain at the drain site   Awaiting Sinogram results but it seems to be in the cavity.   Abd- S/NT/ND  The  Drain site hole has enlarged and there is some drainage from it. The lower hole also has drainage.   Plan  Will need to continue drain for sometime  Ok to  Have diet   Will have wound care see again to see if there is anything they can do for the drainage  D/w dr.talreja - he will do EGD on Friday.

## 2017-08-03 NOTE — Progress Notes (Signed)
Clinical Pharmacy Note: Re: IV to PO Automatic Conversion    Please note: Aida RaiderRebecca D Flurry???s medications (metronidazole, pantoprazole) has/have been changed from IV to PO based on the following criteria:    The patient:  1.   Has received IV therapy for at least 48 hours   2.   Has a functioning GI tract  - Taking scheduled oral medications  - Tolerating tube feeds at goal rate or a full liquid, soft, or regular diet         3. Is clinically stable        - Temperature < 100.49F for at least 24 hours        - WBC is trending down    This IV to PO conversion is based on the [employer] approved automatic conversion policy for eligible patients.  Please call with questions.

## 2017-08-04 ENCOUNTER — Encounter

## 2017-08-04 ENCOUNTER — Inpatient Hospital Stay

## 2017-08-04 LAB — CULTURE, FUNGUS

## 2017-08-04 LAB — GLUCOSE, POC
Glucose (POC): 250 mg/dL — ABNORMAL HIGH (ref 65–100)
Glucose (POC): 254 mg/dL — ABNORMAL HIGH (ref 65–100)
Glucose (POC): 173 mg/dL — ABNORMAL HIGH (ref 65–100)
Glucose (POC): 251 mg/dL — ABNORMAL HIGH (ref 65–100)

## 2017-08-04 MED ORDER — INSULIN GLARGINE 100 UNIT/ML INJECTION
100 unit/mL | Freq: Every day | SUBCUTANEOUS | Status: DC
Start: 2017-08-04 — End: 2017-08-05
  Administered 2017-08-05: 16:00:00 via SUBCUTANEOUS

## 2017-08-04 MED FILL — TRIAMTERENE-HYDROCHLOROTHIAZIDE 37.5 MG-25 MG TAB: ORAL | Qty: 1

## 2017-08-04 MED FILL — INSULIN LISPRO 100 UNIT/ML INJECTION: 100 unit/mL | SUBCUTANEOUS | Qty: 1

## 2017-08-04 MED FILL — METRONIDAZOLE 250 MG TAB: 250 mg | ORAL | Qty: 2

## 2017-08-04 MED FILL — AMLODIPINE 5 MG TAB: 5 mg | ORAL | Qty: 1

## 2017-08-04 MED FILL — SERTRALINE 50 MG TAB: 50 mg | ORAL | Qty: 2

## 2017-08-04 MED FILL — NORMAL SALINE FLUSH 0.9 % INJECTION SYRINGE: INTRAMUSCULAR | Qty: 10

## 2017-08-04 MED FILL — PANTOPRAZOLE 40 MG TAB, DELAYED RELEASE: 40 mg | ORAL | Qty: 1

## 2017-08-04 MED FILL — CEFTRIAXONE 1 GRAM SOLUTION FOR INJECTION: 1 gram | INTRAMUSCULAR | Qty: 1

## 2017-08-04 MED FILL — ACETAMINOPHEN 325 MG TABLET: 325 mg | ORAL | Qty: 2

## 2017-08-04 MED FILL — ZOLPIDEM 5 MG TAB: 5 mg | ORAL | Qty: 1

## 2017-08-04 MED FILL — NORMAL SALINE FLUSH 0.9 % INJECTION SYRINGE: INTRAMUSCULAR | Qty: 30

## 2017-08-04 MED FILL — LANTUS U-100 INSULIN 100 UNIT/ML SUBCUTANEOUS SOLUTION: 100 unit/mL | SUBCUTANEOUS | Qty: 0.1

## 2017-08-04 NOTE — Progress Notes (Signed)
Bedside shift change report given to Lourdes Sledgeorinne, Charity fundraiserN (Cabin crewoncoming nurse) by Carlena SaxBlair, RN (offgoing nurse). Report included the following information SBAR, Kardex, OR Summary, Procedure Summary, MAR and Recent Results.

## 2017-08-04 NOTE — Progress Notes (Signed)
Bedside and Verbal shift change report given to Blair Rn (oncoming nurse) by Roxanne RN (offgoing nurse). Report included the following information SBAR.

## 2017-08-04 NOTE — Progress Notes (Addendum)
[place]   [facility]  [address]  [place], [place] [id number]       GI PROGRESS NOTE  Will Broadus JohnWarren, [place]-[phone] office  [phone] NP/PA in-hospital cell phone M-F until 4:30PM  After 5PM or on weekends, please call operator for physician on call    NAME: Dana SeatRebecca D Saks   DOB:  Mar 03, 1956   MRN:  [id number]       Subjective:   Patient is sitting upright in bed and appears comfortable.  Denies nausea, vomiting, or abdominal pain.  She reports some tenderness at drain site.  She is tolerating a regular diet.    Objective:     VITALS:   Last 24hrs VS reviewed since prior progress note. Most recent are:  Visit Vitals  BP 117/80 (BP 1 Location: Left arm, BP Patient Position: Head of bed elevated (Comment degrees))   Pulse 86   Temp 97.7 ??F (36.5 ??C)   Resp 16   Ht 5\' 4"  (1.626 m)   Wt 100.4 kg (221 lb 6.4 oz)   SpO2 97%   Breastfeeding? No   BMI 38.00 kg/m??       PHYSICAL EXAM:  General: Cooperative, no acute distress????  Neurologic:?? Alert and oriented  HEENT: EOMI, no scleral icterus   Lungs:  CTA bilaterally anteriorly  Heart:  S1 S2  Abdomen: Soft, non-distended, no tenderness, no guarding, no rebound. +Bowel sounds. Dressing in place. Drain with a small amount of purulent drainage.  Extremities: Warm  Psych:???? Not anxious or agitated    Lab Data Reviewed:     Recent Results (from the past 24 hour(s))   GLUCOSE, POC    Collection Time: 08/03/17 11:46 AM   Result Value Ref Range    Glucose (POC) 282 (H) 65 - 100 mg/dL    Performed by Reed BreechJACKSON TONIA    GLUCOSE, POC    Collection Time: 08/03/17  4:56 PM   Result Value Ref Range    Glucose (POC) 131 (H) 65 - 100 mg/dL    Performed by Jeneen RinksAKUNWAFOR LUCY(CON)    GLUCOSE, POC    Collection Time: 08/03/17  9:49 PM   Result Value Ref Range    Glucose (POC) 250 (H) 65 - 100 mg/dL    Performed by Janett Labellaaniesha Beaver    GLUCOSE, POC    Collection Time: 08/04/17  6:38 AM   Result Value Ref Range    Glucose (POC) 254 (H) 65 - 100 mg/dL    Performed by Janett Labellaaniesha Beaver         Assessment:   ?? Intra-abdominal abscess: drainage catheter in place, on antibiotics. UGI series (08/02/17): no definite extravasation identified. CT abdomen without contrast (08/02/17): no evidence for an ulcer or leak; abscess now measures no greater than 2 cm with pigtail drain in place.  ?? History of perforated posterior prepyloric ulcer??with repair (05/13/17)  ?? Obstructive sleep apnea  ?? Morbid obesity     Patient Active Problem List   Diagnosis Code   ??? Hypercholesterolemia E78.00   ??? Diabetes (HCC) E11.9   ??? Sleep apnea G47.30   ??? Hypertension complicating diabetes (HCC) E11.59, I10   ??? Morbid obesity due to excess calories (HCC) E66.01   ??? Hives L50.9   ??? Acute gastric ulcer with perforation (HCC) K25.1   ??? Arthralgia of both knees M25.561, M25.562   ??? Abdominal pain R10.9   ??? Peritoneal abscess (HCC) K65.1   ??? Intra-abdominal abscess (HCC) K65.1   ??? Hx of gastric  ulcer Z87.19     Plan:   ?? NPO after midnight  ?? PPI  ?? On antibiotics  ?? Plan for EGD with possible over the scope clip placement with Dr. Tally Joealreja tomorrow. Risks of the procedure were again discussed with the patient. Patient understands and is in agreement to proceed.      Signed By: Joylene JohnWilliam C Warren, PA     08/04/2017  9:56 AM         GI Attending: Agree with above. Plan for EGD with possible over-the-scope clip placement tomorrow. Please keep NPO after midnight.    Oree Mirelez P. Tally Joealreja, MD

## 2017-08-04 NOTE — Other (Signed)
DTC Progress Note    Recommendations/ Comments: Chart reviewed due to hyperglycemia. Blood sugars  131-254 mg/dl over the last 24 hours. If appropriate, please consider:  1. Add carb consistent to diet order  2. Increasing Lantus to 15 units  3. Changing correction scale to Humalog with normal sensitivity ac and hs.     Current hospital DM medication: Lantus 10 units and correction scale Humalog.     Chart reviewed on Dana [name].    Patient is a 61 y.o. female with known diabetes on Metformin 500 mg at breakfast and 1000 mg at dinner and NPH 32 units hs at home.    A1c:   Lab Results   Component Value Date/Time    Hemoglobin A1c 7.3 (H) 05/13/2017 03:04 AM    Hemoglobin A1c 7.2 (H) 01/31/2015 03:52 PM       Recent Glucose Results:   Lab Results   Component Value Date/Time    GLUCPOC 173 (H) 08/04/2017 11:30 AM    GLUCPOC 254 (H) 08/04/2017 06:38 AM    GLUCPOC 250 (H) 08/03/2017 09:49 PM        Lab Results   Component Value Date/Time    Creatinine 0.46 (L) 08/02/2017 07:00 AM     Estimated Creatinine Clearance: 151.8 mL/min (A) (based on SCr of 0.46 mg/dL (L)).    Active Orders   Diet    DIET NPO    DIET REGULAR Low Fiber        PO intake: No data found.    Will continue to follow as needed.    Thank you    Barbra SarksAmy Song Myre, RD, CDE        Time spent: 6 minutes

## 2017-08-04 NOTE — Progress Notes (Addendum)
Daily Progress Note  [facility]  Admit Date: 07/29/2017  She presented with perforated gastric ulcer and underwent ex laparotomy repair of posterior ulcer with oversew and onlay omental patch on 05/13/17.  She's had persistent perigastric fluid collection, s/p placement of percutaneous drains with no resolution, and colon perforation.  Sinogram on 08/03/17 negative for fistula.        Subjective:     Last 24 hrs: She is frustrated with erosion of tissue, pain, and ongoing drainage from around the drain insertion site. She notes plan for EGD tomorrow. WBC 5.9, T max 99.5.  On flagyl and rocephin.        Objective:     Blood pressure 117/80, pulse 86, temperature 97.7 ??F (36.5 ??C), resp. rate 16, height 5\' 4"  (1.626 m), weight 100.4 kg (221 lb 6.4 oz), SpO2 97 %, not currently breastfeeding.  Temp (24hrs), Avg:98.4 ??F (36.9 ??C), Min:97.7 ??F (36.5 ??C), Max:99.5 ??F (37.5 ??C)      _____________________  Physical Exam:     Alert and Oriented, sitting up in bed, no acute distress.  Cardiovascular: RRR  Lungs:CTAB   Abdomen: soft.  Tender around drain insertion site.  Surrounding skin with excoriation and purulent drainage both within the drainage catheter and leaking around the insertion site.       Assessment:   Active Problems:    Intra-abdominal abscess (HCC) (07/29/2017)      Hx of gastric ulcer (07/29/2017)            Plan:     EGD in am   Will ask wound care to pouch area to help prevent further excoriation  Continue regular diet for now  NPO at midnight        Bary RichardLisa Moss, ACNP - [facility]  [facility]  [address],  [place], [address]  [place], [place]  [phone]    Data Review:    Recent Labs     08/02/17  0830   WBC 5.9   HGB 11.5   HCT 36.4   PLT 280     Recent Labs     08/02/17  0700   NA 139   K 4.0   CL 106   CO2 20*   GLU 165*   BUN 9   CREA 0.46*   CA 8.4*     No results for input(s): AML, LPSE in the last 72 hours.        ______________________   Medications:    Current Facility-Administered Medications   Medication Dose Route Frequency   ??? insulin glargine (LANTUS) injection 10 Units  10 Units SubCUTAneous DAILY   ??? metroNIDAZOLE (FLAGYL) tablet 500 mg  500 mg Oral Q12H   ??? pantoprazole (PROTONIX) tablet 40 mg  40 mg Oral Q12H   ??? TPN ADULT - PERIPHERAL   IntraVENous CONTINUOUS   ??? lactated Ringers infusion  50 mL/hr IntraVENous CONTINUOUS   ??? sertraline (ZOLOFT) tablet 100 mg  100 mg Oral DAILY   ??? sodium chloride (NS) flush 5-10 mL  5-10 mL IntraVENous Q8H   ??? sodium chloride (NS) flush 5-10 mL  5-10 mL IntraVENous PRN   ??? glucose chewable tablet 16 g  4 Tab Oral PRN   ??? dextrose (D50W) injection syrg 12.5-25 g  12.5-25 g IntraVENous PRN   ??? glucagon (GLUCAGEN) injection 1 mg  1 mg IntraMUSCular PRN   ??? ondansetron (ZOFRAN) injection 4 mg  4 mg IntraVENous Q6H PRN   ??? diphenhydrAMINE (BENADRYL) 12.5 mg/5 mL oral elixir 12.5 mg  12.5 mg Oral Q6H PRN   ??? HYDROmorphone (DILAUDID) injection 1 mg  1 mg IntraVENous Q3H PRN   ??? amLODIPine (NORVASC) tablet 5 mg  5 mg Oral DAILY   ??? triamterene-hydroCHLOROthiazide (MAXZIDE) 37.5-25 mg per tablet 1 Tab  1 Tab Oral DAILY   ??? LORazepam (ATIVAN) tablet 1 mg  1 mg Oral Q8H PRN   ??? lactated Ringers infusion  100 mL/hr IntraVENous CONTINUOUS   ??? cefTRIAXone (ROCEPHIN) 1 g in 0.9% sodium chloride (MBP/ADV) 50 mL  1 g IntraVENous Q12H   ??? acetaminophen (TYLENOL) tablet 650 mg  650 mg Oral Q4H PRN   ??? insulin lispro (HUMALOG) injection   SubCUTAneous AC&HS   ??? zolpidem (AMBIEN) tablet 5 mg  5 mg Oral QHS       Pt seen and examined  Tolerating regular diet   Still having some drainage from around the wound.  Gen- Alert in NAD  Lungs- CTA  H-RRR   Abd- S/nt/nd minimal drainage from around the tube.   Plan for EGD tomorrow. Depending on findings amy be able to be discharged home after

## 2017-08-04 NOTE — Progress Notes (Signed)
Medical Progress Note      NAME: Dana [name]   DOB:  1956/06/08  MRM:  [id number]    Date/Time: 08/04/2017  5:12 PM    Problem List:   Active Problems:    Intra-abdominal abscess (HCC) (07/29/2017)      Hx of gastric ulcer (07/29/2017)           Subjective:     Patient feeling about the same    Past Medical History:   Diagnosis Date   ??? Diabetes (HCC) 01/24/2013   ??? GERD (gastroesophageal reflux disease)    ??? Gout    ??? Hives    ??? HTN (hypertension)    ??? Hypercholesterolemia    ??? Perforated gastric ulcer (HCC)    ??? Postprocedural intraabdominal abscess        ROS:  General: negative for fever, chills, sweats, weakness  Respiratory:  negative for cough, sputum production, SOB, wheezing, DOE, pleuritic pain  Cardiology:  negative for chest pain, palpitations, orthopnea, PND, edema, syncope   Gastrointestinal: negative for abdominal pain, N/V, dysphagia, change in bowel habits, bleeding         Objective:       Vitals:          Last 24hrs VS reviewed since prior progress note. Most recent are:    Visit Vitals  BP 124/85 (BP 1 Location: Left arm, BP Patient Position: Head of bed elevated (Comment degrees))   Pulse 89   Temp 97.9 ??F (36.6 ??C)   Resp 16   Ht 5\' 4"  (1.626 m)   Wt 221 lb 6.4 oz (100.4 kg)   SpO2 97%   Breastfeeding? No   BMI 38.00 kg/m??     SpO2 Readings from Last 6 Encounters:   08/04/17 97%   07/29/17 94%   07/26/17 96%   07/14/17 96%   06/09/17 98%   05/23/17 98%    O2 Flow Rate (L/min): 3 l/min       Intake/Output Summary (Last 24 hours) at 08/04/2017 1712  Last data filed at 08/04/2017 1627  Gross per 24 hour   Intake 20 ml   Output 95 ml   Net -75 ml          Exam:     General   well developed, well nourished, appears stated age, in no acute distress  Respiratory   Clear To Auscultation bilaterally - no wheezes, rales, rhonchi, or crackles  Cardiology  regular  Abdominal  Catheter in place with dressing intact  Extremities  No clubbing, cyanosis, or edema. Pulses intact.     Lab Data Reviewed: (see below)    Medications Reviewed: (see below)    ______________________________________________________________________    Medications:     Current Facility-Administered Medications   Medication Dose Route Frequency   ??? [START ON 08/05/2017] insulin glargine (LANTUS) injection 15 Units  15 Units SubCUTAneous DAILY   ??? metroNIDAZOLE (FLAGYL) tablet 500 mg  500 mg Oral Q12H   ??? pantoprazole (PROTONIX) tablet 40 mg  40 mg Oral Q12H   ??? lactated Ringers infusion  50 mL/hr IntraVENous CONTINUOUS   ??? sertraline (ZOLOFT) tablet 100 mg  100 mg Oral DAILY   ??? sodium chloride (NS) flush 5-10 mL  5-10 mL IntraVENous Q8H   ??? sodium chloride (NS) flush 5-10 mL  5-10 mL IntraVENous PRN   ??? glucose chewable tablet 16 g  4 Tab Oral PRN   ??? dextrose (D50W) injection syrg 12.5-25 g  12.5-25 g IntraVENous PRN   ???  glucagon (GLUCAGEN) injection 1 mg  1 mg IntraMUSCular PRN   ??? ondansetron (ZOFRAN) injection 4 mg  4 mg IntraVENous Q6H PRN   ??? diphenhydrAMINE (BENADRYL) 12.5 mg/5 mL oral elixir 12.5 mg  12.5 mg Oral Q6H PRN   ??? HYDROmorphone (DILAUDID) injection 1 mg  1 mg IntraVENous Q3H PRN   ??? amLODIPine (NORVASC) tablet 5 mg  5 mg Oral DAILY   ??? triamterene-hydroCHLOROthiazide (MAXZIDE) 37.5-25 mg per tablet 1 Tab  1 Tab Oral DAILY   ??? LORazepam (ATIVAN) tablet 1 mg  1 mg Oral Q8H PRN   ??? cefTRIAXone (ROCEPHIN) 1 g in 0.9% sodium chloride (MBP/ADV) 50 mL  1 g IntraVENous Q12H   ??? acetaminophen (TYLENOL) tablet 650 mg  650 mg Oral Q4H PRN   ??? insulin lispro (HUMALOG) injection   SubCUTAneous AC&HS   ??? zolpidem (AMBIEN) tablet 5 mg  5 mg Oral QHS            Lab Review:     Recent Labs     08/02/17  0830   WBC 5.9   HGB 11.5   HCT 36.4   PLT 280     Recent Labs     08/02/17  0700   NA 139   K 4.0   CL 106   CO2 20*   GLU 165*   BUN 9   CREA 0.46*   CA 8.4*     Lab Results   Component Value Date/Time    Glucose (POC) 251 (H) 08/04/2017 04:01 PM    Glucose (POC) 173 (H) 08/04/2017 11:30 AM     Glucose (POC) 254 (H) 08/04/2017 06:38 AM    Glucose (POC) 250 (H) 08/03/2017 09:49 PM    Glucose (POC) 131 (H) 08/03/2017 04:56 PM     No results for input(s): PH, PCO2, PO2, HCO3, FIO2 in the last 72 hours.  No results for input(s): INR in the last 72 hours.    No lab exists for component: INREXT    Other pertinent lab: NA         Assessment:     Patient Active Problem List   Diagnosis Code   ??? Hypercholesterolemia E78.00   ??? Diabetes (HCC) E11.9   ??? Sleep apnea G47.30   ??? Hypertension complicating diabetes (HCC) E11.59, I10   ??? Morbid obesity due to excess calories (HCC) E66.01   ??? Hives L50.9   ??? Acute gastric ulcer with perforation (HCC) K25.1   ??? Arthralgia of both knees M25.561, M25.562   ??? Abdominal pain R10.9   ??? Peritoneal abscess (HCC) K65.1   ??? Intra-abdominal abscess (HCC) K65.1   ??? Hx of gastric ulcer Z87.19          Plan:                 1. Intra - abd abscess- on iv abx, per surgery  2. Diabetes- increase lantus to 15 units                ___________________________________________________    Attending Physician: Lake BellsNancy J Quavion Boule, MD

## 2017-08-05 LAB — GLUCOSE, POC
Glucose (POC): 191 mg/dL — ABNORMAL HIGH (ref 65–100)
Glucose (POC): 192 mg/dL — ABNORMAL HIGH (ref 65–100)
Glucose (POC): 186 mg/dL — ABNORMAL HIGH (ref 65–100)
Glucose (POC): 160 mg/dL — ABNORMAL HIGH (ref 65–100)
Glucose (POC): 168 mg/dL — ABNORMAL HIGH (ref 65–100)

## 2017-08-05 MED ORDER — SODIUM CHLORIDE 0.9 % IJ SYRG
Freq: Three times a day (TID) | INTRAMUSCULAR | Status: AC
Start: 2017-08-05 — End: 2017-08-05
  Administered 2017-08-05: 12:00:00 via INTRAVENOUS

## 2017-08-05 MED ORDER — ATROPINE 0.1 MG/ML SYRINGE
0.1 mg/mL | Freq: Once | INTRAMUSCULAR | Status: DC | PRN
Start: 2017-08-05 — End: 2017-08-05

## 2017-08-05 MED ORDER — NALOXONE 0.4 MG/ML INJECTION
0.4 mg/mL | INTRAMUSCULAR | Status: DC | PRN
Start: 2017-08-05 — End: 2017-08-05

## 2017-08-05 MED ORDER — LACTATED RINGERS IV
INTRAVENOUS | Status: DC | PRN
Start: 2017-08-05 — End: 2017-08-05
  Administered 2017-08-05: 13:00:00 via INTRAVENOUS

## 2017-08-05 MED ORDER — PROPOFOL 10 MG/ML IV EMUL
10 mg/mL | INTRAVENOUS | Status: DC | PRN
Start: 2017-08-05 — End: 2017-08-05
  Administered 2017-08-05 (×9): via INTRAVENOUS

## 2017-08-05 MED ORDER — FENTANYL CITRATE (PF) 50 MCG/ML IJ SOLN
50 mcg/mL | INTRAMUSCULAR | Status: DC | PRN
Start: 2017-08-05 — End: 2017-08-05
  Administered 2017-08-05 (×2): via INTRAVENOUS

## 2017-08-05 MED ORDER — TRIMETHOPRIM-SULFAMETHOXAZOLE 160 MG-800 MG TAB
160-800 mg | ORAL_TABLET | Freq: Two times a day (BID) | ORAL | 0 refills | Status: AC
Start: 2017-08-05 — End: 2017-08-19

## 2017-08-05 MED ORDER — DIPHENHYDRAMINE HCL 50 MG/ML IJ SOLN
50 mg/mL | Freq: Once | INTRAMUSCULAR | Status: DC
Start: 2017-08-05 — End: 2017-08-05

## 2017-08-05 MED ORDER — FLUMAZENIL 0.1 MG/ML IV SOLN
0.1 mg/mL | INTRAVENOUS | Status: DC | PRN
Start: 2017-08-05 — End: 2017-08-05

## 2017-08-05 MED ORDER — EPINEPHRINE 0.1 MG/ML SYRINGE
0.1 mg/mL | Freq: Once | INTRAMUSCULAR | Status: DC | PRN
Start: 2017-08-05 — End: 2017-08-05

## 2017-08-05 MED ORDER — TRIMETHOPRIM-SULFAMETHOXAZOLE 160 MG-800 MG TAB
160-800 mg | ORAL_TABLET | Freq: Two times a day (BID) | ORAL | 0 refills | Status: AC
Start: 2017-08-05 — End: 2017-08-15

## 2017-08-05 MED ORDER — SODIUM CHLORIDE 0.9 % IJ SYRG
INTRAMUSCULAR | Status: AC | PRN
Start: 2017-08-05 — End: 2017-08-05

## 2017-08-05 MED ORDER — MIDAZOLAM 1 MG/ML IJ SOLN
1 mg/mL | INTRAMUSCULAR | Status: DC | PRN
Start: 2017-08-05 — End: 2017-08-05

## 2017-08-05 MED ORDER — FENTANYL CITRATE (PF) 50 MCG/ML IJ SOLN
50 mcg/mL | INTRAMUSCULAR | Status: DC | PRN
Start: 2017-08-05 — End: 2017-08-05

## 2017-08-05 MED ORDER — SIMETHICONE 40 MG/0.6 ML ORAL DROPS, SUSP
40 mg/0.6 mL | ORAL | Status: DC | PRN
Start: 2017-08-05 — End: 2017-08-05
  Administered 2017-08-05: 13:00:00 via ORAL

## 2017-08-05 MED FILL — FENTANYL CITRATE (PF) 50 MCG/ML IJ SOLN: 50 mcg/mL | INTRAMUSCULAR | Qty: 2

## 2017-08-05 MED FILL — NORMAL SALINE FLUSH 0.9 % INJECTION SYRINGE: INTRAMUSCULAR | Qty: 10

## 2017-08-05 MED FILL — DIPRIVAN 10 MG/ML INTRAVENOUS EMULSION: 10 mg/mL | INTRAVENOUS | Qty: 27

## 2017-08-05 MED FILL — METRONIDAZOLE 250 MG TAB: 250 mg | ORAL | Qty: 2

## 2017-08-05 MED FILL — SERTRALINE 50 MG TAB: 50 mg | ORAL | Qty: 2

## 2017-08-05 MED FILL — INSULIN LISPRO 100 UNIT/ML INJECTION: 100 unit/mL | SUBCUTANEOUS | Qty: 1

## 2017-08-05 MED FILL — AMLODIPINE 5 MG TAB: 5 mg | ORAL | Qty: 1

## 2017-08-05 MED FILL — HYDROMORPHONE 2 MG/ML INJECTION SOLUTION: 2 mg/mL | INTRAMUSCULAR | Qty: 1

## 2017-08-05 MED FILL — PANTOPRAZOLE 40 MG TAB, DELAYED RELEASE: 40 mg | ORAL | Qty: 1

## 2017-08-05 MED FILL — TRIAMTERENE-HYDROCHLOROTHIAZIDE 37.5 MG-25 MG TAB: ORAL | Qty: 1

## 2017-08-05 MED FILL — CEFTRIAXONE 1 GRAM SOLUTION FOR INJECTION: 1 gram | INTRAMUSCULAR | Qty: 1

## 2017-08-05 MED FILL — LACTATED RINGERS IV: INTRAVENOUS | Qty: 1000

## 2017-08-05 MED FILL — LANTUS U-100 INSULIN 100 UNIT/ML SUBCUTANEOUS SOLUTION: 100 unit/mL | SUBCUTANEOUS | Qty: 0.15

## 2017-08-05 MED FILL — ZOLPIDEM 5 MG TAB: 5 mg | ORAL | Qty: 1

## 2017-08-05 NOTE — Anesthesia Post-Procedure Evaluation (Signed)
Procedure(s):  ESOPHAGOGASTRODUODENOSCOPY (EGD)  ESOPHAGOGASTRODUODENAL (EGD) BIOPSY.    Anesthesia Post Evaluation        Patient location during evaluation: PACU  Patient participation: complete - patient participated  Level of consciousness: awake  Pain management: adequate  Airway patency: patent  Anesthetic complications: no  Cardiovascular status: hemodynamically stable  Respiratory status: acceptable  Hydration status: acceptable  Comments: I have seen and evaluated the patient. The patient is ready for PACU discharge.  Timoteo Carreiro I Carney Saxton, DO                         Visit Vitals  BP 98/67   Pulse 84   Temp 36.6 ??C (97.9 ??F)   Resp 22   Ht 5\' 4"  (1.626 m)   Wt 100.4 kg (221 lb 6.4 oz)   SpO2 94%   Breastfeeding? No   BMI 38.00 kg/m??

## 2017-08-05 NOTE — Anesthesia Pre-Procedure Evaluation (Signed)
Anesthetic History   No history of anesthetic complications            Review of Systems / Medical History  Patient summary reviewed, nursing notes reviewed and pertinent labs reviewed    Pulmonary  Within defined limits      Sleep apnea           Neuro/Psych   Within defined limits           Cardiovascular  Within defined limits  Hypertension              Exercise tolerance: >4 METS     GI/Hepatic/Renal  Within defined limits   GERD      PUD     Endo/Other  Within defined limits  Diabetes    Morbid obesity     Other Findings              Physical Exam    Airway  Mallampati: III  TM Distance: > 6 cm  Neck ROM: normal range of motion   Mouth opening: Normal     Cardiovascular  Regular rate and rhythm,  S1 and S2 normal,  no murmur, click, rub, or gallop             Dental  No notable dental hx       Pulmonary  Breath sounds clear to auscultation               Abdominal  GI exam deferred       Other Findings            Anesthetic Plan    ASA: 3  Anesthesia type: general          Induction: Intravenous  Anesthetic plan and risks discussed with: Patient

## 2017-08-05 NOTE — Progress Notes (Signed)
[facility]        Subjective     No new changes, still some pain at the drain site    Objective     Patient Vitals for the past 24 hrs:   Temp Pulse Resp BP SpO2   08/05/17 1133 98 ??F (36.7 ??C) 76 16 106/68 97 %   08/05/17 0940 97.8 ??F (36.6 ??C) 80 16 138/90 97 %   08/05/17 0923 ??? 78 17 (!) 144/95 94 %   08/05/17 0913 ??? 71 18 (!) 142/94 95 %   08/05/17 0903 ??? 75 17 121/83 95 %   08/05/17 0853 ??? 76 16 136/86 96 %   08/05/17 0843 ??? 89 21 (!) 154/93 95 %   08/05/17 0833 ??? 82 14 128/87 95 %   08/05/17 0820 ??? 84 22 98/67 94 %   08/05/17 0740 ??? 82 18 133/87 96 %   08/04/17 2353 97.9 ??F (36.6 ??C) 82 16 127/86 97 %   08/04/17 2051 97.7 ??F (36.5 ??C) 87 16 133/60 98 %   08/04/17 1557 97.9 ??F (36.6 ??C) 89 16 124/85 97 %       Date 08/04/17 0700 - 08/05/17 0659 08/05/17 0700 - 08/06/17 0659   Shift 0700-1859 1900-0659 24 Hour Total 0700-1859 1900-0659 24 Hour Total   INTAKE   P.O.    350  350     P.O.    350  350   I.V.(mL/kg/hr)    530  530     I.V.    230  230     Volume (lactated Ringers infusion)    300  300   NG/GT 10  10        Intake (ml) (Drain mid abdominal abcess drain 07/30/17 Mid;Upper Abdomen) 10  10      Shift Total(mL/kg) 10(0.1)  10(0.1) 880(8.7)  880(8.7)   OUTPUT   Urine(mL/kg/hr)           Urine Occurrence(s)  2 x 2 x      Drains 35  35        Output (ml) (Drain mid abdominal abcess drain 07/30/17 Mid;Upper Abdomen) 35  35      Shift Total(mL/kg) 35(0.3)  35(0.3)      NET -25  -25 880  880   Weight (kg) 100.4 100.4 100.4 101.1 101.1 101.1       PE  Pulm - CTAB  CV - RRR  Abd - soft, ND, BS present, drain in place with surrounding erythema, small open tract above the drain with purulent fluid    Labs  Recent Results (from the past 12 hour(s))   GLUCOSE, POC    Collection Time: 08/05/17  6:58 AM   Result Value Ref Range    Glucose (POC) 192 (H) 65 - 100 mg/dL    Performed by AGNEW CONNIE    GLUCOSE, POC    Collection Time: 08/05/17  9:59 AM    Result Value Ref Range    Glucose (POC) 186 (H) 65 - 100 mg/dL    Performed by Dairl PonderWOODS NIKEISHA    GLUCOSE, POC    Collection Time: 08/05/17 11:47 AM   Result Value Ref Range    Glucose (POC) 160 (H) 65 - 100 mg/dL    Performed by Dairl PonderWOODS NIKEISHA          Assessment     Dana [name] is a 61 y.o.yr old female with abscess after repair of gastric perforation  Plan     -EGD today did not show any obvious fistula but did have an ulcer present at the site of the perforation  -She will cont PPIs for the ulcer  -The drain is not draining fluid very well and seems to be causing her a lot of pain currently  -I have discussed her with Rodman PickleJulie Grey and she will pouch the area around the drain for now  -I would consider removing the drain since it is not draining well on its own and because the drain is draining around the wound already. She does not appear on imaging to have a fistula but it just seems likely that she does but it is too small to see on films.  Maybe removing the drain will allow the drainage to be managed a little better with a pouch seal and a smaller pouch to collect the purulent fluid.  It is likely to keep draining without the drain    Dana RedwoodNathan Cory Kitt, MD

## 2017-08-05 NOTE — Wound Image (Signed)
WOCN Note:     Follow-up visit for??pigtail drain site with periwound erosion/MASD.??  ??  Chart shows:  exploratory laparotomy perforated posterior ulcer repair over sew with patch on 05/12/2017. She was admitted on 07/02/2017 for abdominal pain and 07/17/2017 for peritoneal abscess. 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 She was last discharged home on 07/26/2017. She presents with complaints of pus draining from around her pigtail drain site  Pigtail drain exchange 07/29/17 with colon perforation.??  EGD showed ulceration.   ??  Assessment:   Patient is A&O x??4, continent and mobile??and continent.??  Patient reports??tenderness to skin around drain site but reduced  ??  1. POA,??central abdominal drain site with redness immediate to margins but much reduced skin irritation than before. Some induration.  Creamy tan effluent in equal amounts from both around drain and second hole as well.     Marathon applied to protect skin and then pouched using 2.75" 2-piece flat pouch with tubing adaptor to allow the drain to exit the pouch.    ??  Recommendations:????  Maintain pouch and, if lost, cover with dry dressing.   ??  Discussed above plan with patient and Dr. Nedra HaiLee.  ??  Transition of Care:??Plan to follow weekly and as needed while admitted to hospital.     Rodman PickleJulie Ermias Tomeo, BSN, RN, [facility]  Certified Wound, Ostomy, Continence Nurse  office [phone]  pager [phone] or call operator to page

## 2017-08-05 NOTE — Procedures (Signed)
Dana [name], Dana [name]        [facility]- Gastroduodenoscopy (EGD) Procedure Note    Dana [name]  March 09, 1956  [id number]      Procedure: Endoscopic Gastroduodenoscopy --diagnostic, with biopsy    Indication: history of perforated gastric ulcer with abdominal abscess. Concern for gastric fistula on CT, but no contrast extravasation seen on UGI series.    Pre-operative Diagnosis: see indication above    Post-operative Diagnosis: see findings below    Operator: Dana Romain P. Elisha Ponder, MD    Referring Provider:  Genene Churn, MD      Anesthesia/Sedation:  MAC anesthesia Propofol        Procedure Details     After informed consent was obtained for the procedure, with all risks and benefits of procedure explained the patient was taken to the endoscopy suite and placed in the left lateral decubitus position.  Following sequential administration of sedation as per above, the endoscope was inserted into the mouth and advanced under direct vision to second portion of the duodenum.  A careful inspection was made as the gastroscope was withdrawn, including a retroflexed view of the proximal stomach; findings and interventions are described below.      Findings:   Esophagus:normal  Stomach: in the pre-pyloric antrum, a punctate clean-based ulceration with excavation was seen in the region of operative repair. 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 No purulent drainage was seen. An over-the-scope clip was not indicated. Endoscopically, this appeared to be a closed site. Separately, cold biopsies were taken of the antrum given her ulcer disease.  Duodenum: mild duodenitis      Therapies:  As above    Specimens: 1. gastric         EBL: None      Complications:   None; patient tolerated the procedure well.           Impression:    1. Small ulcer seen at site of what was likely gastric side of the fistula.     Recommendations:   1. Follow up surgical pathology  2. Treat for H pylori, if positive  3. Continue PPI for at least 2 months  4. Avoid non-essential NSAID's  5. Advance diet as tolerated.  6. Weekend team to see on request. Please call with any questions    Signed By: Birdie Hopes. Elisha Ponder, MD     08/05/2017  8:15 AM

## 2017-08-05 NOTE — Progress Notes (Signed)
Bedside and Verbal shift change report given to Rachel Kamal, RN (oncoming nurse) by Katherine Dabney, RN (offgoing nurse). Report included the following information SBAR, Kardex, Intake/Output, MAR, Accordion, Recent Results and Med Rec Status.

## 2017-08-05 NOTE — Progress Notes (Signed)
Medical Progress Note      NAME: Dana [name]   DOB:  15-Jan-1956  MRM:  [id number]    Date/Time: 08/05/2017  8:31 PM    Problem List:   Active Problems:    Intra-abdominal abscess (HCC) (07/29/2017)      Hx of gastric ulcer (07/29/2017)           Subjective:     Patient c/o pain at drain site    Past Medical History:   Diagnosis Date   ??? Diabetes (HCC) 01/24/2013   ??? GERD (gastroesophageal reflux disease)    ??? Gout    ??? Hives    ??? HTN (hypertension)    ??? Hypercholesterolemia    ??? Perforated gastric ulcer (HCC)    ??? Postprocedural intraabdominal abscess        ROS:  General: negative for fever, chills, sweats, weakness  Ear Nose and Throat: negative for rhinorrhea, pharyngitis, otalgia, tinnitus, speech or swallowing difficulties  Cardiology:  negative for chest pain, palpitations, orthopnea, PND, edema, syncope   Gastrointestinal: positive for  Pain around drain         Objective:       Vitals:          Last 24hrs VS reviewed since prior progress note. Most recent are:    Visit Vitals  BP 122/86 (BP 1 Location: Left arm, BP Patient Position: At rest)   Pulse 89   Temp 98.5 ??F (36.9 ??C)   Resp 16   Ht 5\' 4"  (1.626 m)   Wt 222 lb 12.8 oz (101.1 kg)   SpO2 96%   Breastfeeding? No   BMI 38.24 kg/m??     SpO2 Readings from Last 6 Encounters:   08/05/17 96%   07/29/17 94%   07/26/17 96%   07/14/17 96%   06/09/17 98%   05/23/17 98%    O2 Flow Rate (L/min): 3 l/min       Intake/Output Summary (Last 24 hours) at 08/05/2017 2031  Last data filed at 08/05/2017 1824  Gross per 24 hour   Intake 1940 ml   Output 45 ml   Net 1895 ml          Exam:     General   well developed, well nourished, appears stated age, in no acute distress  Respiratory   Clear To Auscultation bilaterally - no wheezes, rales, rhonchi, or crackles  Cardiology  regular  Abdominal  Soft, non-tender, non-distended, positive bowel sounds, no hepatosplenomegaly  Extremities  No clubbing, cyanosis, or edema. Pulses intact.    Lab Data Reviewed: (see below)     Medications Reviewed: (see below)    ______________________________________________________________________    Medications:     Current Facility-Administered Medications   Medication Dose Route Frequency   ??? [START ON 08/06/2017] insulin glargine (LANTUS) injection 20 Units  20 Units SubCUTAneous DAILY   ??? metroNIDAZOLE (FLAGYL) tablet 500 mg  500 mg Oral Q12H   ??? pantoprazole (PROTONIX) tablet 40 mg  40 mg Oral Q12H   ??? lactated Ringers infusion  50 mL/hr IntraVENous CONTINUOUS   ??? sertraline (ZOLOFT) tablet 100 mg  100 mg Oral DAILY   ??? sodium chloride (NS) flush 5-10 mL  5-10 mL IntraVENous Q8H   ??? sodium chloride (NS) flush 5-10 mL  5-10 mL IntraVENous PRN   ??? glucose chewable tablet 16 g  4 Tab Oral PRN   ??? dextrose (D50W) injection syrg 12.5-25 g  12.5-25 g IntraVENous PRN   ??? glucagon (  GLUCAGEN) injection 1 mg  1 mg IntraMUSCular PRN   ??? ondansetron (ZOFRAN) injection 4 mg  4 mg IntraVENous Q6H PRN   ??? diphenhydrAMINE (BENADRYL) 12.5 mg/5 mL oral elixir 12.5 mg  12.5 mg Oral Q6H PRN   ??? HYDROmorphone (DILAUDID) injection 1 mg  1 mg IntraVENous Q3H PRN   ??? amLODIPine (NORVASC) tablet 5 mg  5 mg Oral DAILY   ??? triamterene-hydroCHLOROthiazide (MAXZIDE) 37.5-25 mg per tablet 1 Tab  1 Tab Oral DAILY   ??? LORazepam (ATIVAN) tablet 1 mg  1 mg Oral Q8H PRN   ??? cefTRIAXone (ROCEPHIN) 1 g in 0.9% sodium chloride (MBP/ADV) 50 mL  1 g IntraVENous Q12H   ??? acetaminophen (TYLENOL) tablet 650 mg  650 mg Oral Q4H PRN   ??? insulin lispro (HUMALOG) injection   SubCUTAneous AC&HS   ??? zolpidem (AMBIEN) tablet 5 mg  5 mg Oral QHS            Lab Review:     No results for input(s): WBC, HGB, HCT, PLT, HGBEXT, HCTEXT, PLTEXT in the last 72 hours.  No results for input(s): NA, K, CL, CO2, GLU, BUN, CREA, CA, MG, PHOS, ALB, TBIL, TBILI, SGOT, ALT, INR in the last 72 hours.    No lab exists for component: INREXT  Lab Results   Component Value Date/Time    Glucose (POC) 168 (H) 08/05/2017 04:09 PM     Glucose (POC) 160 (H) 08/05/2017 11:47 AM    Glucose (POC) 186 (H) 08/05/2017 09:59 AM    Glucose (POC) 192 (H) 08/05/2017 06:58 AM    Glucose (POC) 191 (H) 08/04/2017 09:30 PM     No results for input(s): PH, PCO2, PO2, HCO3, FIO2 in the last 72 hours.  No results for input(s): INR in the last 72 hours.    No lab exists for component: INREXT    Other pertinent lab: NA         Assessment:     Patient Active Problem List   Diagnosis Code   ??? Hypercholesterolemia E78.00   ??? Diabetes (HCC) E11.9   ??? Sleep apnea G47.30   ??? Hypertension complicating diabetes (HCC) E11.59, I10   ??? Morbid obesity due to excess calories (HCC) E66.01   ??? Hives L50.9   ??? Acute gastric ulcer with perforation (HCC) K25.1   ??? Arthralgia of both knees M25.561, M25.562   ??? Abdominal pain R10.9   ??? Peritoneal abscess (HCC) K65.1   ??? Intra-abdominal abscess (HCC) K65.1   ??? Hx of gastric ulcer Z87.19          Plan:                 1. abd abscess- s/p drain  2. Diabetes- increase lantus to 20 units                ___________________________________________________    Attending Physician: Lake BellsNancy J Alinda Egolf, MD

## 2017-08-05 NOTE — Progress Notes (Signed)
Attempt made to call SBAR out report to floor nurse.  Nurse currently unavailable and will return call at [phone].

## 2017-08-05 NOTE — Other (Signed)
Dana [name]  08/19/1956  [id number]    Situation:  Verbal report received from: Hadassah  Procedure: Procedure(s):  ESOPHAGOGASTRODUODENOSCOPY (EGD)  ESOPHAGOGASTRODUODENAL (EGD) BIOPSY    Background:    Preoperative diagnosis: Gastric Fistula  Postoperative diagnosis: 1.  Gastric Ulcer    Operator:  Dr. Tally Joealreja  Assistant(s): Endoscopy Technician-1: Ancil LinseyGiles, Larmar  Endoscopy RN-1: Red ChristiansMartin, Hadassah B, RN    Specimens:   ID Type Source Tests Collected by Time Destination   1 : Gastric Biopsy Preservative   Erling Contealreja, Jayant P., MD 08/05/2017 [id number] Pathology     H. Pylori  no    Assessment:  Intra-procedure medications     Anesthesia gave intra-procedure sedation and medications, see anesthesia flow sheet yes    Intravenous fluids: NS@ KVO     Vital signs stable yes    Abdominal assessment: round and soft yes    Recommendation:  Discharge patient per MD order na.  Return to floor yes  Family or Friend na  Permission to share finding with family or friend yes

## 2017-08-05 NOTE — Progress Notes (Signed)
Bedside shift change report given to Ashley (oncoming nurse) by Cathy (offgoing nurse). Report included the following information SBAR.

## 2017-08-05 NOTE — Progress Notes (Signed)
Neva SeatRebecca D Girvan  November 30, 1955  [id number]    Situation:    Scheduled Procedure: Procedure(s):  ESOPHAGOGASTRODUODENOSCOPY (EGD) with [place] Clip  Verbal report received from: , RN  Preoperative diagnosis: Intra-abdominal abscess  Accordion drain to replace, perforated bowel.     Background:    Procedure: Procedure(s):  ESOPHAGOGASTRODUODENOSCOPY (EGD) with [place] Clip  Physician performing procedure; Dr. Tally Joealreja    SBAR QUESTIONS FLOOR TO ENDO RN    NPO Status/Last PO Intake: Since midnight    Pregnancy Test:Not applicable If yes, result: none    Is the patient taking Blood Thinners: NO If yes  Is the patient diabetic:yes       If yes, what was the last BS: 191  Time taken?  2200 Anything given? yes 1unit, insulin          Does the patient have a Pacemaker/Defibrillator in place?: no   Does the patient need antibiotics before/during/after procedure: yes   Does the patient have SCD in place:no   Is patient on CONTACT precautions:no    Assessment:  Are the vital signs stable prior to patient coming to ENDO?  yes  Is the patient alert/oriented and able to sign consent for the procedures:yes  Does the patient have a patient IV in place? PICC    Recommendation:  Family or Friend present no     Permission to share finding with Family or Friend n/a

## 2017-08-05 NOTE — Progress Notes (Signed)
Bedside shift change report given to Katie,RN (oncoming nurse) by Corinne Agnew,RN (offgoing nurse). Report included the following information SBAR, Kardex, Intake/Output, MAR and Recent Results.

## 2017-08-05 NOTE — Procedures (Signed)
Procedures  by Marlise Eves., MD at 08/05/17 [phone]                Author: Marlise Eves., MD  Service: --  Author Type: Physician       Filed: 08/05/17 0822  Date of Service: 08/05/17 0814  Status: Signed          Editor: Marlise Eves., MD (Physician)            Pre-procedure Diagnoses        1. Gastric ulcer with perforation, unspecified chronicity ([place]) [K25.5]                           Post-procedure Diagnoses        1. Gastric ulcer without hemorrhage or perforation, unspecified chronicity [K25.9]                           Procedures        1. UPPER GI ENDOSCOPY,BIOPSY [[id number]]                              [name], [name]           [facility]- Gastroduodenoscopy (EGD) Procedure Note      Dana [name]   February 25, 1956   [id number]         Procedure: Endoscopic Gastroduodenoscopy --diagnostic, with biopsy      Indication: history of perforated gastric ulcer with abdominal abscess. Concern for gastric fistula on CT, but no contrast extravasation seen on UGI series.      Pre-operative Diagnosis: see indication above      Post-operative Diagnosis: see findings below      Operator: Haidee Stogsdill P. Elisha Ponder, MD      Referring Provider:  Genene Churn, MD         Anesthesia/Sedation:  MAC anesthesia Propofol           Procedure Details       After informed consent was obtained for the procedure, with all risks and benefits of procedure explained the patient was taken to the endoscopy suite and placed in the left lateral decubitus position.  Following sequential administration of sedation  as per above, the endoscope was inserted into the mouth and advanced under direct vision to second portion of the duodenum.  A careful inspection was made as the gastroscope was withdrawn, including a retroflexed view of the proximal stomach; findings  and interventions are described below.        Findings:    Esophagus:normal   Stomach: in the pre-pyloric antrum, a  punctate clean-based ulceration with excavation was seen in the region of operative repair. 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 No purulent drainage was seen. An over-the-scope  clip was not indicated. Endoscopically, this appeared to be a closed site. Separately, cold biopsies were taken of the antrum given her ulcer disease.   Duodenum: mild duodenitis         Therapies:  As above      Specimens: 1. gastric           EBL: None        Complications:   None; patient tolerated the procedure well.  Impression:     1. Small ulcer seen at site of what was likely gastric side of the fistula.       Recommendations:   1. Follow up surgical pathology   2. Treat for H pylori, if positive   3. Continue PPI for at least 2 months   4. Avoid non-essential NSAID's   5. Advance diet as tolerated.   6. Weekend team to see on request. Please call with any questions         Signed By:  Birdie Hopes. Elisha Ponder, MD           08/05/2017  8:15 AM

## 2017-08-06 ENCOUNTER — Inpatient Hospital Stay: Admit: 2017-08-06 | Payer: BLUE CROSS/BLUE SHIELD | Primary: Internal Medicine

## 2017-08-06 LAB — GLUCOSE, POC
Glucose (POC): 182 mg/dL — ABNORMAL HIGH (ref 65–100)
Glucose (POC): 172 mg/dL — ABNORMAL HIGH (ref 65–100)
Glucose (POC): 158 mg/dL — ABNORMAL HIGH (ref 65–100)
Glucose (POC): 130 mg/dL — ABNORMAL HIGH (ref 65–100)

## 2017-08-06 MED ORDER — INSULIN GLARGINE 100 UNIT/ML INJECTION
100 unit/mL | Freq: Every day | SUBCUTANEOUS | Status: DC
Start: 2017-08-06 — End: 2017-08-09
  Administered 2017-08-06 – 2017-08-08 (×3): via SUBCUTANEOUS

## 2017-08-06 MED FILL — INSULIN LISPRO 100 UNIT/ML INJECTION: 100 unit/mL | SUBCUTANEOUS | Qty: 1

## 2017-08-06 MED FILL — HYDROMORPHONE 2 MG/ML INJECTION SOLUTION: 2 mg/mL | INTRAMUSCULAR | Qty: 1

## 2017-08-06 MED FILL — SERTRALINE 50 MG TAB: 50 mg | ORAL | Qty: 2

## 2017-08-06 MED FILL — CEFTRIAXONE 1 GRAM SOLUTION FOR INJECTION: 1 gram | INTRAMUSCULAR | Qty: 1

## 2017-08-06 MED FILL — AMLODIPINE 5 MG TAB: 5 mg | ORAL | Qty: 1

## 2017-08-06 MED FILL — ZOLPIDEM 5 MG TAB: 5 mg | ORAL | Qty: 1

## 2017-08-06 MED FILL — LANTUS U-100 INSULIN 100 UNIT/ML SUBCUTANEOUS SOLUTION: 100 unit/mL | SUBCUTANEOUS | Qty: 0.2

## 2017-08-06 MED FILL — METRONIDAZOLE 250 MG TAB: 250 mg | ORAL | Qty: 2

## 2017-08-06 MED FILL — LORAZEPAM 1 MG TAB: 1 mg | ORAL | Qty: 1

## 2017-08-06 MED FILL — PANTOPRAZOLE 40 MG TAB, DELAYED RELEASE: 40 mg | ORAL | Qty: 1

## 2017-08-06 MED FILL — TRIAMTERENE-HYDROCHLOROTHIAZIDE 37.5 MG-25 MG TAB: ORAL | Qty: 1

## 2017-08-06 MED FILL — NORMAL SALINE FLUSH 0.9 % INJECTION SYRINGE: INTRAMUSCULAR | Qty: 10

## 2017-08-06 NOTE — Progress Notes (Signed)
1330 Patient's Xray confirms that PICC line overlies that SVC. Not[facility] DecemberSharon [facility].

## 2017-08-06 NOTE — Progress Notes (Signed)
Chest xray states PICC is in the brachiocephalic vein. Both lumens flushed with 20 cc normal saline and chest xray ordered stat. PICC is positonal and when patient raises her arm has positive brisk blood return and flushes easily

## 2017-08-06 NOTE — Progress Notes (Signed)
Bedside shift change report given to Ashley, RN (oncoming nurse) by Cathy, RN (offgoing nurse). Report included the following information SBAR, Kardex, Intake/Output and Recent Results.

## 2017-08-06 NOTE — Progress Notes (Signed)
PICC is now in the SVC per chest xray. Nurse aware

## 2017-08-06 NOTE — Progress Notes (Signed)
Dr's Loletta Specteritus, Hendrix, Mayford Knifeurner, Pahle & Corabelle Spackman  Admit Date: 07/29/2017    Subjective:     Pt's main c/o is continued pain at the drain site.  Pus is still collecting, mainly from around the drain.  No new complaints.      Current Facility-Administered Medications   Medication Dose Route Frequency   ??? insulin glargine (LANTUS) injection 20 Units  20 Units SubCUTAneous DAILY   ??? metroNIDAZOLE (FLAGYL) tablet 500 mg  500 mg Oral Q12H   ??? pantoprazole (PROTONIX) tablet 40 mg  40 mg Oral Q12H   ??? lactated Ringers infusion  50 mL/hr IntraVENous CONTINUOUS   ??? sertraline (ZOLOFT) tablet 100 mg  100 mg Oral DAILY   ??? sodium chloride (NS) flush 5-10 mL  5-10 mL IntraVENous Q8H   ??? sodium chloride (NS) flush 5-10 mL  5-10 mL IntraVENous PRN   ??? glucose chewable tablet 16 g  4 Tab Oral PRN   ??? dextrose (D50W) injection syrg 12.5-25 g  12.5-25 g IntraVENous PRN   ??? glucagon (GLUCAGEN) injection 1 mg  1 mg IntraMUSCular PRN   ??? ondansetron (ZOFRAN) injection 4 mg  4 mg IntraVENous Q6H PRN   ??? diphenhydrAMINE (BENADRYL) 12.5 mg/5 mL oral elixir 12.5 mg  12.5 mg Oral Q6H PRN   ??? HYDROmorphone (DILAUDID) injection 1 mg  1 mg IntraVENous Q3H PRN   ??? amLODIPine (NORVASC) tablet 5 mg  5 mg Oral DAILY   ??? triamterene-hydroCHLOROthiazide (MAXZIDE) 37.5-25 mg per tablet 1 Tab  1 Tab Oral DAILY   ??? LORazepam (ATIVAN) tablet 1 mg  1 mg Oral Q8H PRN   ??? cefTRIAXone (ROCEPHIN) 1 g in 0.9% sodium chloride (MBP/ADV) 50 mL  1 g IntraVENous Q12H   ??? acetaminophen (TYLENOL) tablet 650 mg  650 mg Oral Q4H PRN   ??? insulin lispro (HUMALOG) injection   SubCUTAneous AC&HS   ??? zolpidem (AMBIEN) tablet 5 mg  5 mg Oral QHS          Objective:     Patient Vitals for the past 8 hrs:   BP Temp Pulse Resp SpO2 Weight   08/06/17 1147 (!) 170/118 98.2 ??F (36.8 ??C) 75 18 96 % ???   08/06/17 0738 121/86 98.1 ??F (36.7 ??C) 82 16 93 % ???   08/06/17 0722 ??? ??? ??? ??? ??? 225 lb 1.4 oz (102.1 kg)     12/01 0701 - 12/01 1900  In: 350 [P.O.:350]  Out: -    11/29 1901 - 12/01 0700  In: 8717.5 [P.O.:1170; I.V.:7527.5]  Out: 65 [Drains:65]    Physical Exam: NAD. A&O.                             Neck -- Supple.  No JVD.                             Heart -- RRR.                             Lungs -- CTA.                             Abd -- Drain in place.  Ext -- No LE edema, b/l.      Data Review   Recent Results (from the past 24 hour(s))   GLUCOSE, POC    Collection Time: 08/05/17  4:09 PM   Result Value Ref Range    Glucose (POC) 168 (H) 65 - 100 mg/dL    Performed by Dairl PonderWOODS NIKEISHA    GLUCOSE, POC    Collection Time: 08/05/17  9:08 PM   Result Value Ref Range    Glucose (POC) 182 (H) 65 - 100 mg/dL    Performed by Janett Labellaaniesha Beaver    GLUCOSE, POC    Collection Time: 08/06/17  6:37 AM   Result Value Ref Range    Glucose (POC) 172 (H) 65 - 100 mg/dL    Performed by Philomena Courserent Kyra    GLUCOSE, POC    Collection Time: 08/06/17 10:54 AM   Result Value Ref Range    Glucose (POC) 158 (H) 65 - 100 mg/dL    Performed by Oswaldo ConroyBurton Heather            Assessment:     Principal Problem:    Intra-abdominal abscess (HCC) (07/29/2017)      Active Problems:    Diabetes (HCC) (01/24/2013)      Hypertension complicating diabetes (HCC) (05/25/2016)      Hx of gastric ulcer (07/29/2017)        Plan:     1. On IV Rocephin & PO Flagyl.  2. Management per surgery.          Nelida GoresJ Stephen Teryn Gust, MD

## 2017-08-06 NOTE — Progress Notes (Addendum)
General Surgery Daily Progress Note  Admit Date: 07/29/2017  Post-Operative Day: 1 Day Post-Op from Procedure(s):  ESOPHAGOGASTRODUODENOSCOPY (EGD)  ESOPHAGOGASTRODUODENAL (EGD) BIOPSY     Subjective:     Last 24 hrs: Pt w/ pain on any movement from drain.  Would like drain removed   Once she gets upright and walks she is okay    Objective:     Blood pressure 121/86, pulse 82, temperature 98.1 ??F (36.7 ??C), resp. rate 16, height 5\' 4"  (1.626 m), weight 225 lb 1.4 oz (102.1 kg), SpO2 93 %, not currently breastfeeding.  Temp (24hrs), Avg:98.1 ??F (36.7 ??C), Min:97.9 ??F (36.6 ??C), Max:98.5 ??F (36.9 ??C)      _____________________  Physical Exam:     Alert and Oriented, x3, in no acute distress.  Cardiovascular: RRR, no peripheral edema  Abdomen: soft, R drain site w/ pouch w/ tan drainage; no new drainage in accordian drain      Assessment:   Active Problems:    Intra-abdominal abscess (HCC) (07/29/2017)      Hx of gastric ulcer (07/29/2017)            Plan:     Pain mgmt  Consider removing drain - not really doing anything  Cont abx  PPI  ambulate    Data Review:    No results for input(s): WBC, HGB, HCT, PLT, HGBEXT, HCTEXT, PLTEXT in the last 72 hours.  No results for input(s): NA, K, CL, CO2, GLU, BUN, CREA, CA, MG, PHOS, ALB, TBIL, SGOT, ALT, INR in the last 72 hours.    No lab exists for component: INREXT  No results for input(s): AML, LPSE in the last 72 hours.        ______________________  Medications:    Current Facility-Administered Medications   Medication Dose Route Frequency   ??? insulin glargine (LANTUS) injection 20 Units  20 Units SubCUTAneous DAILY   ??? metroNIDAZOLE (FLAGYL) tablet 500 mg  500 mg Oral Q12H   ??? pantoprazole (PROTONIX) tablet 40 mg  40 mg Oral Q12H   ??? lactated Ringers infusion  50 mL/hr IntraVENous CONTINUOUS   ??? sertraline (ZOLOFT) tablet 100 mg  100 mg Oral DAILY   ??? sodium chloride (NS) flush 5-10 mL  5-10 mL IntraVENous Q8H    ??? sodium chloride (NS) flush 5-10 mL  5-10 mL IntraVENous PRN   ??? glucose chewable tablet 16 g  4 Tab Oral PRN   ??? dextrose (D50W) injection syrg 12.5-25 g  12.5-25 g IntraVENous PRN   ??? glucagon (GLUCAGEN) injection 1 mg  1 mg IntraMUSCular PRN   ??? ondansetron (ZOFRAN) injection 4 mg  4 mg IntraVENous Q6H PRN   ??? diphenhydrAMINE (BENADRYL) 12.5 mg/5 mL oral elixir 12.5 mg  12.5 mg Oral Q6H PRN   ??? HYDROmorphone (DILAUDID) injection 1 mg  1 mg IntraVENous Q3H PRN   ??? amLODIPine (NORVASC) tablet 5 mg  5 mg Oral DAILY   ??? triamterene-hydroCHLOROthiazide (MAXZIDE) 37.5-25 mg per tablet 1 Tab  1 Tab Oral DAILY   ??? LORazepam (ATIVAN) tablet 1 mg  1 mg Oral Q8H PRN   ??? cefTRIAXone (ROCEPHIN) 1 g in 0.9% sodium chloride (MBP/ADV) 50 mL  1 g IntraVENous Q12H   ??? acetaminophen (TYLENOL) tablet 650 mg  650 mg Oral Q4H PRN   ??? insulin lispro (HUMALOG) injection   SubCUTAneous AC&HS   ??? zolpidem (AMBIEN) tablet 5 mg  5 mg Oral QHS       Dana GandyMary S Rogers, NP  08/06/2017        ADDENDUM:  Dana CockingSihong Lyndsi Altic, MD  Pt seen and examined.  Pt is very tearful and reported significant pain from Pigtail drain.  Pt reported drain is so painful it's preventing her from moving.  Indicated to patient that she will need to have to drain because if it's taken out too early the skin may close over and the abscess will advance intra-peritoneal and soft tissue causing sepsis.  Will plan to repeat CT scan on Monday.  If scan shows improvement then will plan to exchange drain with penrose drain.  Continue antibiotic and local wound care.  Pain control.  Ativan prn for anxiety.

## 2017-08-07 LAB — GLUCOSE, POC
Glucose (POC): 176 mg/dL — ABNORMAL HIGH (ref 65–100)
Glucose (POC): 195 mg/dL — ABNORMAL HIGH (ref 65–100)
Glucose (POC): 166 mg/dL — ABNORMAL HIGH (ref 65–100)
Glucose (POC): 155 mg/dL — ABNORMAL HIGH (ref 65–100)

## 2017-08-07 MED ORDER — COLCHICINE 0.6 MG TAB
0.6 mg | Freq: Two times a day (BID) | ORAL | Status: DC | PRN
Start: 2017-08-07 — End: 2017-08-11
  Administered 2017-08-07 – 2017-08-11 (×7): via ORAL

## 2017-08-07 MED FILL — METRONIDAZOLE 250 MG TAB: 250 mg | ORAL | Qty: 2

## 2017-08-07 MED FILL — PANTOPRAZOLE 40 MG TAB, DELAYED RELEASE: 40 mg | ORAL | Qty: 1

## 2017-08-07 MED FILL — CEFTRIAXONE 1 GRAM SOLUTION FOR INJECTION: 1 gram | INTRAMUSCULAR | Qty: 1

## 2017-08-07 MED FILL — NORMAL SALINE FLUSH 0.9 % INJECTION SYRINGE: INTRAMUSCULAR | Qty: 10

## 2017-08-07 MED FILL — COLCHICINE 0.6 MG TAB: 0.6 mg | ORAL | Qty: 1

## 2017-08-07 MED FILL — ZOLPIDEM 5 MG TAB: 5 mg | ORAL | Qty: 1

## 2017-08-07 MED FILL — HYDROMORPHONE 2 MG/ML INJECTION SOLUTION: 2 mg/mL | INTRAMUSCULAR | Qty: 1

## 2017-08-07 MED FILL — ACETAMINOPHEN 325 MG TABLET: 325 mg | ORAL | Qty: 2

## 2017-08-07 MED FILL — TRIAMTERENE-HYDROCHLOROTHIAZIDE 37.5 MG-25 MG TAB: ORAL | Qty: 1

## 2017-08-07 MED FILL — LANTUS U-100 INSULIN 100 UNIT/ML SUBCUTANEOUS SOLUTION: 100 unit/mL | SUBCUTANEOUS | Qty: 0.2

## 2017-08-07 MED FILL — SERTRALINE 50 MG TAB: 50 mg | ORAL | Qty: 2

## 2017-08-07 MED FILL — AMLODIPINE 5 MG TAB: 5 mg | ORAL | Qty: 1

## 2017-08-07 NOTE — Progress Notes (Signed)
Progress Note    Patient: Dana [name] MRN: [id number]  SSN: [id number]    Date of Birth: September 26, 1955  Age: 61 y.o.  Sex: female      Admit Date: 07/29/2017    2 Days Post-Op    Procedure:  Procedure(s):  ESOPHAGOGASTRODUODENOSCOPY (EGD)  ESOPHAGOGASTRODUODENAL (EGD) BIOPSY    Subjective:     No acute surgical issues.  Pt is in better spirit today.  Still reporting pain from pigtail drain.       Objective:     Visit Vitals  BP 143/88 (BP 1 Location: Left arm, BP Patient Position: At rest)   Pulse 81   Temp 98.1 ??F (36.7 ??C)   Resp 18   Ht 5\' 4"  (1.626 m)   Wt 232 lb 12.9 oz (105.6 kg)   SpO2 96%   Breastfeeding? No   BMI 39.96 kg/m??       Temp (24hrs), Avg:98.1 ??F (36.7 ??C), Min:97.6 ??F (36.4 ??C), Max:98.5 ??F (36.9 ??C)        Physical Exam:    Gen:  NAD  Pulm:  Unlabored  Abd:  S/ND/obese/moderate TTP at drain site  Wound:  Seropurulent drainage from pigtail siteC/D/I    Recent Results (from the past 24 hour(s))   GLUCOSE, POC    Collection Time: 08/06/17  4:13 PM   Result Value Ref Range    Glucose (POC) 130 (H) 65 - 100 mg/dL    Performed by Cahoon  Swazil[place]Jordan    GLUCOSE, POC    Collection Time: 08/06/17  9:29 PM   Result Value Ref Range    Glucose (POC) 176 (H) 65 - 100 mg/dL    Performed by Olive BassKenny Marc    GLUCOSE, POC    Collection Time: 08/07/17  6:46 AM   Result Value Ref Range    Glucose (POC) 195 (H) 65 - 100 mg/dL    Performed by Nehemiah SettleHennlein Kaitlyn    GLUCOSE, POC    Collection Time: 08/07/17 12:41 PM   Result Value Ref Range    Glucose (POC) 166 (H) 65 - 100 mg/dL    Performed by Iona HansenKamal McClure Rachel          Assessment:     Hospital Problems  Date Reviewed: 07/13/2017          Codes Class Noted POA    * (Principal) Intra-abdominal abscess (HCC) ICD-10-CM: K65.1  ICD-9-CM: 567.22  07/29/2017 Unknown        Hx of gastric ulcer ICD-10-CM: Z87.19  ICD-9-CM: V12.79  07/29/2017 Unknown        Hypertension complicating diabetes (HCC) ICD-10-CM: E11.59, I10  ICD-9-CM: 250.80, 401.9  05/25/2016 Yes         Diabetes (HCC) ICD-10-CM: E11.9  ICD-9-CM: 250.00  01/24/2013 Yes              Plan/Recommendations/Medical Decision Making:     - Abdominal wound:  As there is persistent leakage around drain, 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  Pt indicated pigtail drain is causing severe pain and she cannot tolerate having drain there much longer.    - Will obtain repeat CT scan tomorrow to evaluate for fistula or intra-abbdominal abscess  - IF CT scan is okay tomorrow then will plan to exchange pigtail drain for penrose drain on Tuesday under monitored anesthesia care in OR.  - Continue antibiotic diet as tolerated  - NPO at 5 am on Tuesday morning    Signed By: Kandis CockingSihong Kassey Laforest, MD  August 07, 2017

## 2017-08-07 NOTE — Progress Notes (Signed)
Bedside shift change report given to Corinne (oncoming nurse) by Cathy (offgoing nurse). Report included the following information SBAR.

## 2017-08-07 NOTE — Other (Addendum)
10 PM  Drain flushed with 10cc saline, no output in accordion drain but 70 cc tan output from bag over wound.    Bedside shift change report given to Fleet Contrasachel RN (oncoming nurse) by Martie LeeSabrina RN (offgoing nurse). Report included the following information SBAR, Kardex, Intake/Output, Accordion and Recent Results.

## 2017-08-07 NOTE — Progress Notes (Signed)
Dr's Leane Paraitus, Hendrix, Turner, Pahle & Luanne Krzyzanowski  Admit Date: 07/29/2017    Subjective:     Pt doing OK.  Only new c/o is gout pain in her L foot -- She says colchicine has previously helped.        Current Facility-Administered Medications   Medication Dose Route Frequency   ??? colchicine tablet 0.6 mg  0.6 mg Oral BID PRN   ??? insulin glargine (LANTUS) injection 20 Units  20 Units SubCUTAneous DAILY   ??? metroNIDAZOLE (FLAGYL) tablet 500 mg  500 mg Oral Q12H   ??? pantoprazole (PROTONIX) tablet 40 mg  40 mg Oral Q12H   ??? lactated Ringers infusion  50 mL/hr IntraVENous CONTINUOUS   ??? sertraline (ZOLOFT) tablet 100 mg  100 mg Oral DAILY   ??? sodium chloride (NS) flush 5-10 mL  5-10 mL IntraVENous Q8H   ??? sodium chloride (NS) flush 5-10 mL  5-10 mL IntraVENous PRN   ??? glucose chewable tablet 16 g  4 Tab Oral PRN   ??? dextrose (D50W) injection syrg 12.5-25 g  12.5-25 g IntraVENous PRN   ??? glucagon (GLUCAGEN) injection 1 mg  1 mg IntraMUSCular PRN   ??? ondansetron (ZOFRAN) injection 4 mg  4 mg IntraVENous Q6H PRN   ??? diphenhydrAMINE (BENADRYL) 12.5 mg/5 mL oral elixir 12.5 mg  12.5 mg Oral Q6H PRN   ??? HYDROmorphone (DILAUDID) injection 1 mg  1 mg IntraVENous Q3H PRN   ??? amLODIPine (NORVASC) tablet 5 mg  5 mg Oral DAILY   ??? triamterene-hydroCHLOROthiazide (MAXZIDE) 37.5-25 mg per tablet 1 Tab  1 Tab Oral DAILY   ??? LORazepam (ATIVAN) tablet 1 mg  1 mg Oral Q8H PRN   ??? cefTRIAXone (ROCEPHIN) 1 g in 0.9% sodium chloride (MBP/ADV) 50 mL  1 g IntraVENous Q12H   ??? acetaminophen (TYLENOL) tablet 650 mg  650 mg Oral Q4H PRN   ??? insulin lispro (HUMALOG) injection   SubCUTAneous AC&HS   ??? zolpidem (AMBIEN) tablet 5 mg  5 mg Oral QHS          Objective:     Patient Vitals for the past 8 hrs:   BP Temp Pulse Resp SpO2 Weight   08/07/17 0842 143/88 98.1 ??F (36.7 ??C) 81 18 96 % 232 lb 12.9 oz (105.6 kg)     No intake/output data recorded.  11/30 1901 - 12/02 0700  In: 7847.5 [P.O.:1570; I.V.:6247.5]  Out: 110 [Drains:110]     Physical Exam: NAD. A&O.                             Neck -- Supple.  No JVD.                             Heart -- RRR.                             Lungs -- CTA.                             Abd -- Drain in place.                             Ext -- No LE edema, b/l.  Mild tenderness of L lateral foot.  Data Review   Recent Results (from the past 24 hour(s))   GLUCOSE, POC    Collection Time: 08/06/17  4:13 PM   Result Value Ref Range    Glucose (POC) 130 (H) 65 - 100 mg/dL    Performed by Cahoon  [place]Jordan    GLUCOSE, POC    Collection Time: 08/06/17  9:29 PM   Result Value Ref Range    Glucose (POC) 176 (H) 65 - 100 mg/dL    Performed by Olive BassKenny Marc    GLUCOSE, POC    Collection Time: 08/07/17  6:46 AM   Result Value Ref Range    Glucose (POC) 195 (H) 65 - 100 mg/dL    Performed by Nehemiah SettleHennlein Kaitlyn            Assessment:     Principal Problem:    Intra-abdominal abscess (HCC) (07/29/2017)      Active Problems:    Diabetes (HCC) (01/24/2013)      Hypertension complicating diabetes (HCC) (05/25/2016)      Hx of gastric ulcer (07/29/2017)      Suspected gout of L foot.        Plan:     1. On IV Rocephin & PO Flagyl.  2. Management per surgery.  3. I have added BID PRN Colchicine.          Nelida GoresJ Stephen Ainsley Sanguinetti, MD

## 2017-08-08 ENCOUNTER — Inpatient Hospital Stay: Payer: BLUE CROSS/BLUE SHIELD | Primary: Internal Medicine

## 2017-08-08 ENCOUNTER — Inpatient Hospital Stay: Admit: 2017-08-08 | Payer: BLUE CROSS/BLUE SHIELD | Primary: Internal Medicine

## 2017-08-08 LAB — GLUCOSE, POC
Glucose (POC): 164 mg/dL — ABNORMAL HIGH (ref 65–100)
Glucose (POC): 158 mg/dL — ABNORMAL HIGH (ref 65–100)
Glucose (POC): 139 mg/dL — ABNORMAL HIGH (ref 65–100)
Glucose (POC): 174 mg/dL — ABNORMAL HIGH (ref 65–100)

## 2017-08-08 MED ORDER — SODIUM CHLORIDE 0.9 % IJ SYRG
Freq: Once | INTRAMUSCULAR | Status: AC
Start: 2017-08-08 — End: 2017-08-08
  Administered 2017-08-08: 17:00:00 via INTRAVENOUS

## 2017-08-08 MED ORDER — IOPAMIDOL 76 % IV SOLN
370 mg iodine /mL (76 %) | Freq: Once | INTRAVENOUS | Status: AC
Start: 2017-08-08 — End: 2017-08-08
  Administered 2017-08-08: 17:00:00 via INTRAVENOUS

## 2017-08-08 MED ORDER — SODIUM CHLORIDE 0.9% BOLUS IV
0.9 % | Freq: Once | INTRAVENOUS | Status: AC
Start: 2017-08-08 — End: 2017-08-08
  Administered 2017-08-08: 17:00:00 via INTRAVENOUS

## 2017-08-08 MED ORDER — IOHEXOL 240 MG/ML IV SOLN
240 mg iodine/mL | Freq: Once | INTRAVENOUS | Status: AC
Start: 2017-08-08 — End: 2017-08-08
  Administered 2017-08-08: 15:00:00 via ORAL

## 2017-08-08 MED FILL — SERTRALINE 50 MG TAB: 50 mg | ORAL | Qty: 2

## 2017-08-08 MED FILL — AMLODIPINE 5 MG TAB: 5 mg | ORAL | Qty: 1

## 2017-08-08 MED FILL — PANTOPRAZOLE 40 MG TAB, DELAYED RELEASE: 40 mg | ORAL | Qty: 1

## 2017-08-08 MED FILL — LANTUS U-100 INSULIN 100 UNIT/ML SUBCUTANEOUS SOLUTION: 100 unit/mL | SUBCUTANEOUS | Qty: 0.2

## 2017-08-08 MED FILL — OMNIPAQUE 240 MG IODINE/ML INTRAVENOUS SOLUTION: 240 mg iodine/mL | INTRAVENOUS | Qty: 50

## 2017-08-08 MED FILL — COLCHICINE 0.6 MG TAB: 0.6 mg | ORAL | Qty: 1

## 2017-08-08 MED FILL — NORMAL SALINE FLUSH 0.9 % INJECTION SYRINGE: INTRAMUSCULAR | Qty: 10

## 2017-08-08 MED FILL — HYDROMORPHONE 2 MG/ML INJECTION SOLUTION: 2 mg/mL | INTRAMUSCULAR | Qty: 1

## 2017-08-08 MED FILL — ZOLPIDEM 5 MG TAB: 5 mg | ORAL | Qty: 1

## 2017-08-08 MED FILL — ISOVUE-370  76 % INTRAVENOUS SOLUTION: 370 mg iodine /mL (76 %) | INTRAVENOUS | Qty: 100

## 2017-08-08 MED FILL — NORMAL SALINE FLUSH 0.9 % INJECTION SYRINGE: INTRAMUSCULAR | Qty: 20

## 2017-08-08 MED FILL — METRONIDAZOLE 250 MG TAB: 250 mg | ORAL | Qty: 2

## 2017-08-08 MED FILL — CEFTRIAXONE 1 GRAM SOLUTION FOR INJECTION: 1 gram | INTRAMUSCULAR | Qty: 1

## 2017-08-08 MED FILL — TRIAMTERENE-HYDROCHLOROTHIAZIDE 37.5 MG-25 MG TAB: ORAL | Qty: 1

## 2017-08-08 MED FILL — NORMAL SALINE FLUSH 0.9 % INJECTION SYRINGE: INTRAMUSCULAR | Qty: 40

## 2017-08-08 MED FILL — SODIUM CHLORIDE 0.9 % IV: INTRAVENOUS | Qty: 100

## 2017-08-08 NOTE — Progress Notes (Addendum)
General Surgery Daily Progress Note  Admit Date: 07/29/2017  Post-Operative Day: 3 Days Post-Op from Procedure(s):  ESOPHAGOGASTRODUODENOSCOPY (EGD)  ESOPHAGOGASTRODUODENAL (EGD) BIOPSY     Subjective:     Last 24 hrs: Pt in good spirits.  Happy there is a plan.  Awaiting CT scan  Still has pain at drain site     Objective:     Blood pressure (!) 139/96, pulse 100, temperature 97.7 ??F (36.5 ??C), resp. rate 16, height 5\' 4"  (1.626 m), weight 232 lb 12.9 oz (105.6 kg), SpO2 95 %, not currently breastfeeding.  Temp (24hrs), Avg:98 ??F (36.7 ??C), Min:97.7 ??F (36.5 ??C), Max:98.4 ??F (36.9 ??C)      _____________________  Physical Exam:     Alert and Oriented, x3, in no acute distress.  Cardiovascular: RRR, no peripheral edema  Abdomen: soft, perc drain w/o drainage; pouch w/ tan drainage - 45cc total out yesterday    Assessment:   Principal Problem:    Intra-abdominal abscess (HCC) (07/29/2017)    Active Problems:    Diabetes (HCC) (01/24/2013)      Hypertension complicating diabetes (HCC) (05/25/2016)      Hx of gastric ulcer (07/29/2017)            Plan:     CT today to look for abscess and/or fistula  Npo past MN - for penrose drain placement and drain removal Tuesday if neg CT scan  Cont abx    Data Review:    No results for input(s): WBC, HGB, HCT, PLT, HGBEXT, HCTEXT, PLTEXT in the last 72 hours.  No results for input(s): NA, K, CL, CO2, GLU, BUN, CREA, CA, MG, PHOS, ALB, TBIL, SGOT, ALT, INR in the last 72 hours.    No lab exists for component: INREXT  No results for input(s): AML, LPSE in the last 72 hours.        ______________________  Medications:    Current Facility-Administered Medications   Medication Dose Route Frequency   ??? colchicine tablet 0.6 mg  0.6 mg Oral BID PRN   ??? insulin glargine (LANTUS) injection 20 Units  20 Units SubCUTAneous DAILY   ??? metroNIDAZOLE (FLAGYL) tablet 500 mg  500 mg Oral Q12H   ??? pantoprazole (PROTONIX) tablet 40 mg  40 mg Oral Q12H    ??? lactated Ringers infusion  50 mL/hr IntraVENous CONTINUOUS   ??? sertraline (ZOLOFT) tablet 100 mg  100 mg Oral DAILY   ??? sodium chloride (NS) flush 5-10 mL  5-10 mL IntraVENous Q8H   ??? sodium chloride (NS) flush 5-10 mL  5-10 mL IntraVENous PRN   ??? glucose chewable tablet 16 g  4 Tab Oral PRN   ??? dextrose (D50W) injection syrg 12.5-25 g  12.5-25 g IntraVENous PRN   ??? glucagon (GLUCAGEN) injection 1 mg  1 mg IntraMUSCular PRN   ??? ondansetron (ZOFRAN) injection 4 mg  4 mg IntraVENous Q6H PRN   ??? diphenhydrAMINE (BENADRYL) 12.5 mg/5 mL oral elixir 12.5 mg  12.5 mg Oral Q6H PRN   ??? HYDROmorphone (DILAUDID) injection 1 mg  1 mg IntraVENous Q3H PRN   ??? amLODIPine (NORVASC) tablet 5 mg  5 mg Oral DAILY   ??? triamterene-hydroCHLOROthiazide (MAXZIDE) 37.5-25 mg per tablet 1 Tab  1 Tab Oral DAILY   ??? LORazepam (ATIVAN) tablet 1 mg  1 mg Oral Q8H PRN   ??? cefTRIAXone (ROCEPHIN) 1 g in 0.9% sodium chloride (MBP/ADV) 50 mL  1 g IntraVENous Q12H   ??? acetaminophen (TYLENOL) tablet 650 mg  650 mg Oral Q4H PRN   ??? insulin lispro (HUMALOG) injection   SubCUTAneous AC&HS   ??? zolpidem (AMBIEN) tablet 5 mg  5 mg Oral QHS       Berneice GandyMary S Rogers, NP  08/08/2017    I have independently examined the patient and have reviewed the chart.  I agree with the above plan.  Doing well, bag is working well.  CT scan for today pending.  [facility] for penrose drain placement.    Marton RedwoodNathan Breyson Kelm, MD

## 2017-08-08 NOTE — Wound Image (Signed)
Checked on her today to see how fistula pouch held up around drain tube.  Still has a good seal and is managing the odor better as well as quantifying the output. She is to have a CT today and based on results per Dr. Hulen LusterSuy's note, may exchange pigtail drain for a Penrose tomorrow.  Available as needed.   Rodman PickleJulie Cherilynn Schomburg, CWOCN

## 2017-08-08 NOTE — Progress Notes (Signed)
I agree with Sara Cole's charting

## 2017-08-08 NOTE — Progress Notes (Addendum)
[place]   [facility]  [address]  [place], [place] [id number]       GI PROGRESS NOTE  Will Broadus JohnWarren, [phone] office  [phone] NP/PA in-hospital cell phone M-F until 4:30PM  After 5PM or on weekends, please call operator for physician on call    NAME: Dana SeatRebecca D [name]   DOB:  1955-09-30   MRN:  [id number]       Subjective:   Patient appears comfortable.  No new complaints.  No nausea, vomiting, or abdominal pain.  She reports some persistent pain at the drain site.  She has been tolerating a diet.    Objective:     VITALS:   Last 24hrs VS reviewed since prior progress note. Most recent are:  Visit Vitals  BP (!) 139/96 (BP 1 Location: Left arm, BP Patient Position: Sitting)   Pulse 100   Temp 97.7 ??F (36.5 ??C)   Resp 16   Ht 5\' 4"  (1.626 m)   Wt 101.8 kg (224 lb 8 oz)   SpO2 95%   Breastfeeding? No   BMI 38.54 kg/m??       PHYSICAL EXAM:  General: Cooperative, no acute distress??  Neurologic:?? Alert and oriented  HEENT: EOMI, no scleral icterus   Lungs:  CTA bilaterally anteriorly  Heart:  S1 S2  Abdomen: Soft, non-distended, no tenderness, no guarding, no rebound. +Bowel sounds. Drain in place, pouch with purulent drainage. Mild tenderness at drain site.   Extremities: Warm  Psych:???? Not anxious or agitated    Lab Data Reviewed:     Recent Results (from the past 24 hour(s))   GLUCOSE, POC    Collection Time: 08/07/17 12:41 PM   Result Value Ref Range    Glucose (POC) 166 (H) 65 - 100 mg/dL    Performed by Iona HansenKamal McClure Rachel    GLUCOSE, POC    Collection Time: 08/07/17  5:39 PM   Result Value Ref Range    Glucose (POC) 155 (H) 65 - 100 mg/dL    Performed by Nelson ChimesFURR CATHERINE    GLUCOSE, POC    Collection Time: 08/07/17 10:29 PM   Result Value Ref Range    Glucose (POC) 164 (H) 65 - 100 mg/dL    Performed by Janett Labellaaniesha Beaver    GLUCOSE, POC    Collection Time: 08/08/17  6:25 AM   Result Value Ref Range    Glucose (POC) 158 (H) 65 - 100 mg/dL    Performed by Janett Labellaaniesha Beaver        Assessment:    ?? Intra-abdominal abscess: drainage catheter in place, on antibiotics.??UGI series (08/02/17): no definite extravasation identified. CT abdomen without contrast (08/02/17): no evidence for an ulcer or leak; abscess now measures no greater than 2 cm with pigtail drain in place. EGD (08/05/17): small ulcer seen at site of what was likely gastric side of the fistula. Pathology showed features of reactive gastropathy.  ?? History of perforated posterior prepyloric ulcer??with repair (05/13/17)  ?? Obstructive sleep apnea  ?? Morbid obesity     Patient Active Problem List   Diagnosis Code   ??? Hypercholesterolemia E78.00   ??? Diabetes (HCC) E11.9   ??? Sleep apnea G47.30   ??? Hypertension complicating diabetes (HCC) E11.59, I10   ??? Morbid obesity due to excess calories (HCC) E66.01   ??? Hives L50.9   ??? Acute gastric ulcer with perforation (HCC) K25.1   ??? Arthralgia of both knees M25.561, M25.562   ??? Abdominal pain R10.9   ???  Peritoneal abscess (HCC) K65.1   ??? Intra-abdominal abscess (HCC) K65.1   ??? Hx of gastric ulcer Z87.19     Plan:   ?? Continue PPI  ?? On antibiotics  ?? Avoid non-essential NSAIDs  ?? Further management per surgery     Signed By: Joylene JohnWilliam C Warren, PA     08/08/2017  11:19 AM       GI Attending: Agree with above. Will sign off for now. Please call with any questions.    Yogesh Cominsky P. Tally Joealreja, MD

## 2017-08-08 NOTE — Progress Notes (Signed)
Medical Progress Note      NAME: Neva SeatRebecca D Hurlbut   DOB:  Jun 23, 1956  MRM:  [id number]    Date/Time: 08/08/2017  12:34 PM    Problem List:   Principal Problem:    Intra-abdominal abscess (HCC) (07/29/2017)    Active Problems:    Diabetes (HCC) (01/24/2013)      Hypertension complicating diabetes (HCC) (05/25/2016)      Hx of gastric ulcer (07/29/2017)           Subjective:     Patient feeling ok,  Mild pain at drain site    Past Medical History:   Diagnosis Date   ??? Diabetes (HCC) 01/24/2013   ??? GERD (gastroesophageal reflux disease)    ??? Gout    ??? Hives    ??? HTN (hypertension)    ??? Hypercholesterolemia    ??? Perforated gastric ulcer (HCC)    ??? Postprocedural intraabdominal abscess        ROS:  General: negative for fever, chills, sweats, weakness  Respiratory:  negative for cough, sputum production, SOB, wheezing, DOE, pleuritic pain  Cardiology:  negative for chest pain, palpitations, orthopnea, PND, edema, syncope   Gastrointestinal: negative for abdominal pain, N/V, dysphagia, change in bowel habits, bleeding         Objective:       Vitals:          Last 24hrs VS reviewed since prior progress note. Most recent are:    Visit Vitals  BP (!) 139/96 (BP 1 Location: Left arm, BP Patient Position: Sitting)   Pulse 100   Temp 97.7 ??F (36.5 ??C)   Resp 16   Ht 5\' 4"  (1.626 m)   Wt 224 lb 8 oz (101.8 kg)   SpO2 95%   Breastfeeding? No   BMI 38.54 kg/m??     SpO2 Readings from Last 6 Encounters:   08/08/17 95%   07/29/17 94%   07/26/17 96%   07/14/17 96%   06/09/17 98%   05/23/17 98%    O2 Flow Rate (L/min): 3 l/min       Intake/Output Summary (Last 24 hours) at 08/08/2017 1234  Last data filed at 08/07/2017 2234  Gross per 24 hour   Intake 3548.33 ml   Output 15 ml   Net 3533.33 ml          Exam:     General   well developed, well nourished, appears stated age, in no acute distress  Respiratory   Clear To Auscultation bilaterally - no wheezes, rales, rhonchi, or crackles  Cardiology  regular   Abdominal  Soft, non-tender, non-distended, positive bowel sounds, no hepatosplenomegaly  Extremities  No clubbing, cyanosis, or edema. Pulses intact.    Lab Data Reviewed: (see below)    Medications Reviewed: (see below)    ______________________________________________________________________    Medications:     Current Facility-Administered Medications   Medication Dose Route Frequency   ??? colchicine tablet 0.6 mg  0.6 mg Oral BID PRN   ??? insulin glargine (LANTUS) injection 20 Units  20 Units SubCUTAneous DAILY   ??? metroNIDAZOLE (FLAGYL) tablet 500 mg  500 mg Oral Q12H   ??? pantoprazole (PROTONIX) tablet 40 mg  40 mg Oral Q12H   ??? lactated Ringers infusion  50 mL/hr IntraVENous CONTINUOUS   ??? sertraline (ZOLOFT) tablet 100 mg  100 mg Oral DAILY   ??? sodium chloride (NS) flush 5-10 mL  5-10 mL IntraVENous Q8H   ??? sodium chloride (NS) flush 5-10  mL  5-10 mL IntraVENous PRN   ??? glucose chewable tablet 16 g  4 Tab Oral PRN   ??? dextrose (D50W) injection syrg 12.5-25 g  12.5-25 g IntraVENous PRN   ??? glucagon (GLUCAGEN) injection 1 mg  1 mg IntraMUSCular PRN   ??? ondansetron (ZOFRAN) injection 4 mg  4 mg IntraVENous Q6H PRN   ??? diphenhydrAMINE (BENADRYL) 12.5 mg/5 mL oral elixir 12.5 mg  12.5 mg Oral Q6H PRN   ??? HYDROmorphone (DILAUDID) injection 1 mg  1 mg IntraVENous Q3H PRN   ??? amLODIPine (NORVASC) tablet 5 mg  5 mg Oral DAILY   ??? triamterene-hydroCHLOROthiazide (MAXZIDE) 37.5-25 mg per tablet 1 Tab  1 Tab Oral DAILY   ??? LORazepam (ATIVAN) tablet 1 mg  1 mg Oral Q8H PRN   ??? cefTRIAXone (ROCEPHIN) 1 g in 0.9% sodium chloride (MBP/ADV) 50 mL  1 g IntraVENous Q12H   ??? acetaminophen (TYLENOL) tablet 650 mg  650 mg Oral Q4H PRN   ??? insulin lispro (HUMALOG) injection   SubCUTAneous AC&HS   ??? zolpidem (AMBIEN) tablet 5 mg  5 mg Oral QHS            Lab Review:     No results for input(s): WBC, HGB, HCT, PLT, HGBEXT, HCTEXT, PLTEXT in the last 72 hours.  No results for input(s): NA, K, CL, CO2, GLU, BUN, CREA, CA, MG, PHOS,  ALB, TBIL, TBILI, SGOT, ALT, INR in the last 72 hours.    No lab exists for component: INREXT  Lab Results   Component Value Date/Time    Glucose (POC) 158 (H) 08/08/2017 06:25 AM    Glucose (POC) 164 (H) 08/07/2017 10:29 PM    Glucose (POC) 155 (H) 08/07/2017 05:39 PM    Glucose (POC) 166 (H) 08/07/2017 12:41 PM    Glucose (POC) 195 (H) 08/07/2017 06:46 AM     No results for input(s): PH, PCO2, PO2, HCO3, FIO2 in the last 72 hours.  No results for input(s): INR in the last 72 hours.    No lab exists for component: INREXT    Other pertinent lab: NA         Assessment:     Patient Active Problem List   Diagnosis Code   ??? Hypercholesterolemia E78.00   ??? Diabetes (HCC) E11.9   ??? Sleep apnea G47.30   ??? Hypertension complicating diabetes (HCC) E11.59, I10   ??? Morbid obesity due to excess calories (HCC) E66.01   ??? Hives L50.9   ??? Acute gastric ulcer with perforation (HCC) K25.1   ??? Arthralgia of both knees M25.561, M25.562   ??? Abdominal pain R10.9   ??? Peritoneal abscess (HCC) K65.1   ??? Intra-abdominal abscess (HCC) K65.1   ??? Hx of gastric ulcer Z87.19          Plan:                 1. Intra-abd abscess- draining  2. Diabetes- controlled  3. Gout- recurring,  Will inject if worse. Continue colchicine.                ___________________________________________________    Attending Physician: Lake BellsNancy J Gracianna Vink, MD

## 2017-08-08 NOTE — Progress Notes (Signed)
Bedside shift change report given to Corinne (oncoming nurse) by Gibson (offgoing nurse). Report included the following information SBAR, Kardex and MAR.

## 2017-08-08 NOTE — Progress Notes (Signed)
Bedside shift change report given to Gibson,RN (oncoming nurse) by Corinne Agnew,RN (offgoing nurse). Report included the following information SBAR, Kardex, Intake/Output, MAR and Recent Results.

## 2017-08-09 LAB — GLUCOSE, POC
Glucose (POC): 190 mg/dL — ABNORMAL HIGH (ref 65–100)
Glucose (POC): 175 mg/dL — ABNORMAL HIGH (ref 65–100)
Glucose (POC): 149 mg/dL — ABNORMAL HIGH (ref 65–100)
Glucose (POC): 124 mg/dL — ABNORMAL HIGH (ref 65–100)

## 2017-08-09 MED ORDER — SODIUM CHLORIDE 0.9 % IRRIGATION SOLN
0.9 % | Status: DC | PRN
Start: 2017-08-09 — End: 2017-08-09
  Administered 2017-08-09: 21:00:00

## 2017-08-09 MED ORDER — SODIUM CHLORIDE 0.9 % IJ SYRG
INTRAMUSCULAR | Status: DC | PRN
Start: 2017-08-09 — End: 2017-08-09

## 2017-08-09 MED ORDER — HYDROMORPHONE (PF) 2 MG/ML IJ SOLN
2 mg/mL | INTRAMUSCULAR | Status: AC
Start: 2017-08-09 — End: ?

## 2017-08-09 MED ORDER — MIDAZOLAM 1 MG/ML IJ SOLN
1 mg/mL | INTRAMUSCULAR | Status: AC
Start: 2017-08-09 — End: ?

## 2017-08-09 MED ORDER — BUPIVACAINE-EPINEPHRINE (PF) 0.5 %-1:200,000 IJ SOLN
0.5 %-1:200,000 | Freq: Once | INTRAMUSCULAR | Status: AC
Start: 2017-08-09 — End: 2017-08-09
  Administered 2017-08-09: 21:00:00 via SUBCUTANEOUS

## 2017-08-09 MED ORDER — FENTANYL CITRATE (PF) 50 MCG/ML IJ SOLN
50 mcg/mL | INTRAMUSCULAR | Status: DC | PRN
Start: 2017-08-09 — End: 2017-08-09
  Administered 2017-08-09 (×2): via INTRAVENOUS

## 2017-08-09 MED ORDER — MIDAZOLAM 1 MG/ML IJ SOLN
1 mg/mL | INTRAMUSCULAR | Status: DC | PRN
Start: 2017-08-09 — End: 2017-08-09

## 2017-08-09 MED ORDER — MIDAZOLAM 1 MG/ML IJ SOLN
1 mg/mL | INTRAMUSCULAR | Status: DC | PRN
Start: 2017-08-09 — End: 2017-08-09
  Administered 2017-08-09: 20:00:00 via INTRAVENOUS

## 2017-08-09 MED ORDER — ONDANSETRON (PF) 4 MG/2 ML INJECTION
4 mg/2 mL | INTRAMUSCULAR | Status: DC | PRN
Start: 2017-08-09 — End: 2017-08-09
  Administered 2017-08-09: 21:00:00 via INTRAVENOUS

## 2017-08-09 MED ORDER — INSULIN GLARGINE 100 UNIT/ML INJECTION
100 unit/mL | Freq: Every day | SUBCUTANEOUS | Status: DC
Start: 2017-08-09 — End: 2017-08-09

## 2017-08-09 MED ORDER — INSULIN GLARGINE 100 UNIT/ML INJECTION
100 unit/mL | Freq: Every day | SUBCUTANEOUS | Status: DC
Start: 2017-08-09 — End: 2017-08-11
  Administered 2017-08-10 – 2017-08-11 (×2): via SUBCUTANEOUS

## 2017-08-09 MED ORDER — LACTATED RINGERS IV
INTRAVENOUS | Status: DC
Start: 2017-08-09 — End: 2017-08-09

## 2017-08-09 MED ORDER — BUPIVACAINE-EPINEPHRINE (PF) 0.5 %-1:200,000 IJ SOLN
0.5 %-1:200,000 | INTRAMUSCULAR | Status: AC
Start: 2017-08-09 — End: ?

## 2017-08-09 MED ORDER — SODIUM CHLORIDE 0.9 % IJ SYRG
Freq: Three times a day (TID) | INTRAMUSCULAR | Status: DC
Start: 2017-08-09 — End: 2017-08-09

## 2017-08-09 MED ORDER — PROPOFOL 10 MG/ML IV EMUL
10 mg/mL | INTRAVENOUS | Status: DC | PRN
Start: 2017-08-09 — End: 2017-08-09
  Administered 2017-08-09: 20:00:00 via INTRAVENOUS

## 2017-08-09 MED ORDER — LIDOCAINE (PF) 20 MG/ML (2 %) IJ SOLN
20 mg/mL (2 %) | INTRAMUSCULAR | Status: DC | PRN
Start: 2017-08-09 — End: 2017-08-09
  Administered 2017-08-09: 20:00:00 via INTRAVENOUS

## 2017-08-09 MED ORDER — DEXTROSE 5%-LACTATED RINGERS IV
INTRAVENOUS | Status: DC
Start: 2017-08-09 — End: 2017-08-09

## 2017-08-09 MED ORDER — FENTANYL CITRATE (PF) 50 MCG/ML IJ SOLN
50 mcg/mL | INTRAMUSCULAR | Status: AC
Start: 2017-08-09 — End: ?

## 2017-08-09 MED ORDER — SODIUM CHLORIDE 0.9 % INJECTION
INTRAMUSCULAR | Status: AC
Start: 2017-08-09 — End: ?

## 2017-08-09 MED ORDER — TRIAMCINOLONE ACETONIDE 40 MG/ML SUSP FOR INJECTION
40 mg/mL | Freq: Once | INTRAMUSCULAR | Status: AC
Start: 2017-08-09 — End: 2017-08-10

## 2017-08-09 MED ORDER — FENTANYL CITRATE (PF) 50 MCG/ML IJ SOLN
50 mcg/mL | INTRAMUSCULAR | Status: DC | PRN
Start: 2017-08-09 — End: 2017-08-09

## 2017-08-09 MED ORDER — HYDROMORPHONE (PF) 2 MG/ML IJ SOLN
2 mg/mL | INTRAMUSCULAR | Status: DC | PRN
Start: 2017-08-09 — End: 2017-08-09
  Administered 2017-08-09 (×2): via INTRAVENOUS

## 2017-08-09 MED ORDER — ONDANSETRON (PF) 4 MG/2 ML INJECTION
4 mg/2 mL | INTRAMUSCULAR | Status: DC | PRN
Start: 2017-08-09 — End: 2017-08-09

## 2017-08-09 MED ORDER — OXYCODONE 5 MG TAB
5 mg | ORAL | Status: DC | PRN
Start: 2017-08-09 — End: 2017-08-09

## 2017-08-09 MED ORDER — LIDOCAINE HCL 1 % (10 MG/ML) IJ SOLN
10 mg/mL (1 %) | Freq: Once | INTRAMUSCULAR | Status: AC
Start: 2017-08-09 — End: 2017-08-10

## 2017-08-09 MED ORDER — SUCCINYLCHOLINE CHLORIDE 20 MG/ML INJECTION
20 mg/mL | INTRAMUSCULAR | Status: DC | PRN
Start: 2017-08-09 — End: 2017-08-09
  Administered 2017-08-09: 20:00:00 via INTRAVENOUS

## 2017-08-09 MED ORDER — BACITRACIN 50,000 UNIT IM
50000 unit | INTRAMUSCULAR | Status: AC
Start: 2017-08-09 — End: ?

## 2017-08-09 MED ORDER — MORPHINE 10 MG/ML INJ SOLUTION
10 mg/ml | INTRAMUSCULAR | Status: DC | PRN
Start: 2017-08-09 — End: 2017-08-09

## 2017-08-09 MED ORDER — ROCURONIUM 10 MG/ML IV
10 mg/mL | INTRAVENOUS | Status: DC | PRN
Start: 2017-08-09 — End: 2017-08-09
  Administered 2017-08-09: 20:00:00 via INTRAVENOUS

## 2017-08-09 MED ORDER — LIDOCAINE (PF) 10 MG/ML (1 %) IJ SOLN
10 mg/mL (1 %) | INTRAMUSCULAR | Status: DC | PRN
Start: 2017-08-09 — End: 2017-08-09

## 2017-08-09 MED ORDER — LACTATED RINGERS IV
INTRAVENOUS | Status: DC
Start: 2017-08-09 — End: 2017-08-09
  Administered 2017-08-09: 20:00:00 via INTRAVENOUS

## 2017-08-09 MED FILL — DEXTROSE 5%-LACTATED RINGERS IV: INTRAVENOUS | Qty: 1000

## 2017-08-09 MED FILL — INSULIN LISPRO 100 UNIT/ML INJECTION: 100 unit/mL | SUBCUTANEOUS | Qty: 1

## 2017-08-09 MED FILL — HYDROMORPHONE 2 MG/ML INJECTION SOLUTION: 2 mg/mL | INTRAMUSCULAR | Qty: 1

## 2017-08-09 MED FILL — INSULIN GLARGINE 100 UNIT/ML INJECTION: 100 unit/mL | SUBCUTANEOUS | Qty: 0.2

## 2017-08-09 MED FILL — INSULIN GLARGINE 100 UNIT/ML INJECTION: 100 unit/mL | SUBCUTANEOUS | Qty: 0.24

## 2017-08-09 MED FILL — NORMAL SALINE FLUSH 0.9 % INJECTION SYRINGE: INTRAMUSCULAR | Qty: 10

## 2017-08-09 MED FILL — HYDROMORPHONE (PF) 2 MG/ML IJ SOLN: 2 mg/mL | INTRAMUSCULAR | Qty: 1

## 2017-08-09 MED FILL — ACETAMINOPHEN 325 MG TABLET: 325 mg | ORAL | Qty: 2

## 2017-08-09 MED FILL — LACTATED RINGERS IV: INTRAVENOUS | Qty: 1000

## 2017-08-09 MED FILL — NORMAL SALINE FLUSH 0.9 % INJECTION SYRINGE: INTRAMUSCULAR | Qty: 40

## 2017-08-09 MED FILL — MIDAZOLAM 1 MG/ML IJ SOLN: 1 mg/mL | INTRAMUSCULAR | Qty: 2

## 2017-08-09 MED FILL — SENSORCAINE-MPF/EPINEPHRINE 0.5 %-1:200,000 INJECTION SOLUTION: 0.5 %-1:200,000 | INTRAMUSCULAR | Qty: 30

## 2017-08-09 MED FILL — SERTRALINE 50 MG TAB: 50 mg | ORAL | Qty: 2

## 2017-08-09 MED FILL — ZOLPIDEM 5 MG TAB: 5 mg | ORAL | Qty: 1

## 2017-08-09 MED FILL — FENTANYL CITRATE (PF) 50 MCG/ML IJ SOLN: 50 mcg/mL | INTRAMUSCULAR | Qty: 5

## 2017-08-09 MED FILL — PANTOPRAZOLE 40 MG TAB, DELAYED RELEASE: 40 mg | ORAL | Qty: 1

## 2017-08-09 MED FILL — COLCHICINE 0.6 MG TAB: 0.6 mg | ORAL | Qty: 1

## 2017-08-09 MED FILL — SODIUM CHLORIDE 0.9 % INJECTION: INTRAMUSCULAR | Qty: 10

## 2017-08-09 MED FILL — TRIAMTERENE-HYDROCHLOROTHIAZIDE 37.5 MG-25 MG TAB: ORAL | Qty: 1

## 2017-08-09 MED FILL — AMLODIPINE 5 MG TAB: 5 mg | ORAL | Qty: 1

## 2017-08-09 MED FILL — BACITRACIN 50,000 UNIT IM: 50000 unit | INTRAMUSCULAR | Qty: 50000

## 2017-08-09 MED FILL — CEFTRIAXONE 1 GRAM SOLUTION FOR INJECTION: 1 gram | INTRAMUSCULAR | Qty: 1

## 2017-08-09 MED FILL — METRONIDAZOLE 250 MG TAB: 250 mg | ORAL | Qty: 2

## 2017-08-09 NOTE — Other (Signed)
Patient: Dana [name] MRN: [id number]  SSN: [id number]   Date of Birth: [birth year]  Age: 61 y.o.  Sex: female     Patient is status post Procedure(s) with comments:  ABDOMINAL DRAIN REPLACEMENT POSSIBLE EXPLORATORY LAPAROTOMY (WANTS MINOR TRAY)  REQ 1200 NOON - Superficial debridement of Necrolized Tissue with [employer]  .    Surgeon(s) and Role:     * Suy, Hansel StarlingSihong, MD - Primary    Local/Dose/Irrigation:bupivacaine-EPINEPHrine (PF) (SENSORCAINE PF) 0.5 %-1:200,000 injection 150 mg ;   bacitracin 50,000 Units in sodium chloride irrigation 0.9 % 250 mL Irrigation            PICC Double Lumen 08/02/17 Right;Brachial (Active)   Central Line Being Utilized Yes 08/09/2017 12:15 AM   Criteria for Appropriate Use Limited/no vessel suitable for conventional peripheral access 08/09/2017 12:37 PM   Site Assessment Clean;Dry 08/09/2017 12:37 PM   Phlebitis Assessment 0 08/09/2017 12:37 PM   Infiltration Assessment 0 08/09/2017 12:37 PM   Date of Last Dressing Change 08/09/17 08/09/2017 12:37 PM   Dressing Status New;Occlusive 08/09/2017 12:37 PM   Action Taken Dressing changed 08/09/2017 12:37 PM   External Catheter Length (cm) 0 centimeters 08/09/2017 12:37 PM   Dressing Type Disk with Chlorhexadine gluconate (CHG);Stabilization/securement device;Transparent 08/09/2017 12:37 PM   Hub Color/Line Status Purple;Infusing 08/09/2017 12:37 PM   Positive Blood Return (Site #1) Yes 08/08/2017  2:19 PM   Hub Color/Line Status Red;Infusing 08/09/2017 12:37 PM   Positive Blood Return (Site #2) Yes 08/08/2017  2:19 PM   Alcohol Cap Used Yes 08/08/2017  2:19 PM                  Airway - Endotracheal Tube 08/09/17 Oral (Active)                   Dressing/Packing:  [REMOVED] Wound Abdomen Inferior;Lower;Medial;Right-DRESSING TYPE: 4 x 4;Dry dressing;Special tape (comment) (07/31/17 0303)  Wound Abdomen Mid;Right-DRESSING TYPE: Other (Comment)(New colostomy bag) (08/09/17 1500)  Splint/Cast:  ]    Other:

## 2017-08-09 NOTE — Other (Signed)
TRANSFER - OUT REPORT:    Verbal report given to Gibson(name) on Dana [name]  being transferred to 518(unit) for routine post - op       Report consisted of patient???s Situation, Background, Assessment and   Recommendations(SBAR).     Time Pre op antibiotic given:na  Anesthesia Stop time: na  Foley Present on Transfer to floor:na  Order for Foley on Chart:na  Discharge Prescriptions with Chart:na    Information from the following report(s) SBAR, Kardex, OR Summary, Procedure Summary, Intake/Output, MAR, Recent Results and Med Rec Status was reviewed with the receiving nurse.    Opportunity for questions and clarification was provided.     Is the patient on 02? NO       L/Min        Other     Is the patient on a monitor? NO    Is the nurse transporting with the patient? NO    Surgical Waiting Area notified of patient's transfer from PACU? YES      The following personal items collected during your admission accompanied patient upon transfer:   Dental Appliance: Dental Appliances: None  Vision: Visual Aid: None  Hearing Aid:    Jewelry: Jewelry: None  Clothing: Clothing: At bedside  Other Valuables: Other Valuables: None  Valuables sent to safe: Personal Items Sent to Safe: none     No belongings in PACU.

## 2017-08-09 NOTE — Anesthesia Pre-Procedure Evaluation (Signed)
Anesthetic History   No history of anesthetic complications            Review of Systems / Medical History  Patient summary reviewed, nursing notes reviewed and pertinent labs reviewed    Pulmonary  Within defined limits      Sleep apnea           Neuro/Psych   Within defined limits           Cardiovascular  Within defined limits  Hypertension              Exercise tolerance: >4 METS     GI/Hepatic/Renal  Within defined limits   GERD      PUD     Endo/Other  Within defined limits  Diabetes    Morbid obesity     Other Findings              Physical Exam    Airway  Mallampati: III  TM Distance: > 6 cm  Neck ROM: normal range of motion   Mouth opening: Normal     Cardiovascular  Regular rate and rhythm,  S1 and S2 normal,  no murmur, click, rub, or gallop             Dental  No notable dental hx       Pulmonary  Breath sounds clear to auscultation               Abdominal  GI exam deferred       Other Findings            Anesthetic Plan    ASA: 3  Anesthesia type: general          Induction: Intravenous  Anesthetic plan and risks discussed with: Patient

## 2017-08-09 NOTE — Brief Op Note (Signed)
BRIEF OPERATIVE NOTE    Date of Procedure: 08/09/2017   Preoperative Diagnosis: Intra-abdominal abscess and abdominal wall abscess  Postoperative Diagnosis: Intra-abdominal abscess and abdominal wall abscess      Procedure(s):  ABDOMINAL DRAIN REPLACEMENT  Surgeon(s) and Role:     * Meesha Sek, MD - Primary         Surgical Assistant: Alphonzo LemmingsHoldsworth, Katelyn    Surgical Staff:  Circ-1: Derwood Kaplanate, Traci L  Scrub Tech-1: Donetta PottsSmith, Ayanna  Surg Asst-1: Alphonzo LemmingsHoldsworth, Katelyn H  Event Time In Time Out   Incision Start 08/09/2017 1544    Incision Close 08/09/2017 1607      Anesthesia: General   Estimated Blood Loss: Minimal  Specimens: * No specimens in log *   Findings: There was an abscess pocket encountered along the fistula tract which was opened and approximately 5 ml of purulent fluid was expressed.  Complications: None  Implants: * No implants in log *

## 2017-08-09 NOTE — Progress Notes (Signed)
TRANSFER - IN REPORT:    Verbal report received from KATIE,RN(name) on Dana [name]  being received from 5E(unit) for ordered procedure      Report consisted of patient???s Situation, Background, Assessment and   Recommendations(SBAR).     Information from the following report(s) SBAR, Kardex, Intake/Output, MAR, Recent Results and Pre Procedure Checklist was reviewed with the receiving nurse.    Opportunity for questions and clarification was provided.      Assessment completed upon patient???s arrival to unit and care assumed.

## 2017-08-09 NOTE — Progress Notes (Signed)
Medical Progress Note      NAME: Dana [name]   DOB:  [birth year]  MRM:  [id number]    Date/Time: 08/09/2017  9:48 AM    Problem List:   Principal Problem:    Intra-abdominal abscess (HCC) (07/29/2017)    Active Problems:    Diabetes (HCC) (01/24/2013)      Hypertension complicating diabetes (HCC) (05/25/2016)      Hx of gastric ulcer (07/29/2017)           Subjective:     Patient c/o pain at drain site    Past Medical History:   Diagnosis Date   ??? Atherosclerosis of abdominal aorta (HCC)    ??? Diabetes (HCC) 01/24/2013   ??? GERD (gastroesophageal reflux disease)    ??? Gout    ??? Hives    ??? HTN (hypertension)    ??? Hypercholesterolemia    ??? Perforated gastric ulcer (HCC)    ??? Postprocedural intraabdominal abscess        ROS:  General: negative for fever, chills, sweats, weakness  Respiratory:  negative for cough, sputum production, SOB, wheezing, DOE, pleuritic pain  Cardiology:  negative for chest pain, palpitations, orthopnea, PND, edema, syncope   Gastrointestinal: positive for pain around drain         Objective:       Vitals:          Last 24hrs VS reviewed since prior progress note. Most recent are:    Visit Vitals  BP 130/85 (BP 1 Location: Left arm, BP Patient Position: At rest)   Pulse 73   Temp 98.3 ??F (36.8 ??C)   Resp 16   Ht 5\' 4"  (1.626 m)   Wt 224 lb 8 oz (101.8 kg)   SpO2 93%   Breastfeeding? No   BMI 38.54 kg/m??     SpO2 Readings from Last 6 Encounters:   08/09/17 93%   07/29/17 94%   07/26/17 96%   07/14/17 96%   06/09/17 98%   05/23/17 98%    O2 Flow Rate (L/min): 3 l/min       Intake/Output Summary (Last 24 hours) at 08/09/2017 0948  Last data filed at 08/09/2017 0703  Gross per 24 hour   Intake 20 ml   Output 1935 ml   Net -1915 ml          Exam:     General   well developed, well nourished, appears stated age, in no acute distress  Respiratory   Clear To Auscultation bilaterally - no wheezes, rales, rhonchi, or crackles  Cardiology  Regular Rate and rhythm   Abdominal  Soft, non-tender, non-distended, positive bowel sounds, no hepatosplenomegaly  Extremities  No clubbing, cyanosis, or edema. Pulses intact.    Lab Data Reviewed: (see below)    Medications Reviewed: (see below)    ______________________________________________________________________    Medications:     Current Facility-Administered Medications   Medication Dose Route Frequency   ??? colchicine tablet 0.6 mg  0.6 mg Oral BID PRN   ??? insulin glargine (LANTUS) injection 20 Units  20 Units SubCUTAneous DAILY   ??? metroNIDAZOLE (FLAGYL) tablet 500 mg  500 mg Oral Q12H   ??? pantoprazole (PROTONIX) tablet 40 mg  40 mg Oral Q12H   ??? lactated Ringers infusion  50 mL/hr IntraVENous CONTINUOUS   ??? sertraline (ZOLOFT) tablet 100 mg  100 mg Oral DAILY   ??? sodium chloride (NS) flush 5-10 mL  5-10 mL IntraVENous Q8H   ??? sodium chloride (  NS) flush 5-10 mL  5-10 mL IntraVENous PRN   ??? glucose chewable tablet 16 g  4 Tab Oral PRN   ??? dextrose (D50W) injection syrg 12.5-25 g  12.5-25 g IntraVENous PRN   ??? glucagon (GLUCAGEN) injection 1 mg  1 mg IntraMUSCular PRN   ??? ondansetron (ZOFRAN) injection 4 mg  4 mg IntraVENous Q6H PRN   ??? diphenhydrAMINE (BENADRYL) 12.5 mg/5 mL oral elixir 12.5 mg  12.5 mg Oral Q6H PRN   ??? HYDROmorphone (DILAUDID) injection 1 mg  1 mg IntraVENous Q3H PRN   ??? amLODIPine (NORVASC) tablet 5 mg  5 mg Oral DAILY   ??? triamterene-hydroCHLOROthiazide (MAXZIDE) 37.5-25 mg per tablet 1 Tab  1 Tab Oral DAILY   ??? LORazepam (ATIVAN) tablet 1 mg  1 mg Oral Q8H PRN   ??? cefTRIAXone (ROCEPHIN) 1 g in 0.9% sodium chloride (MBP/ADV) 50 mL  1 g IntraVENous Q12H   ??? acetaminophen (TYLENOL) tablet 650 mg  650 mg Oral Q4H PRN   ??? insulin lispro (HUMALOG) injection   SubCUTAneous AC&HS   ??? zolpidem (AMBIEN) tablet 5 mg  5 mg Oral QHS            Lab Review:     No results for input(s): WBC, HGB, HCT, PLT, HGBEXT, HCTEXT, PLTEXT in the last 72 hours.  No results for input(s): NA, K, CL, CO2, GLU, BUN, CREA, CA, MG, PHOS,  ALB, TBIL, TBILI, SGOT, ALT, INR in the last 72 hours.    No lab exists for component: INREXT  Lab Results   Component Value Date/Time    Glucose (POC) 175 (H) 08/09/2017 06:47 AM    Glucose (POC) 190 (H) 08/08/2017 10:26 PM    Glucose (POC) 174 (H) 08/08/2017 05:27 PM    Glucose (POC) 139 (H) 08/08/2017 12:42 PM    Glucose (POC) 158 (H) 08/08/2017 06:25 AM     No results for input(s): PH, PCO2, PO2, HCO3, FIO2 in the last 72 hours.  No results for input(s): INR in the last 72 hours.    No lab exists for component: INREXT    Other pertinent lab: NA         Assessment:     Patient Active Problem List   Diagnosis Code   ??? Hypercholesterolemia E78.00   ??? Diabetes (HCC) E11.9   ??? Sleep apnea G47.30   ??? Hypertension complicating diabetes (HCC) E11.59, I10   ??? Morbid obesity due to excess calories (HCC) E66.01   ??? Hives L50.9   ??? Acute gastric ulcer with perforation (HCC) K25.1   ??? Arthralgia of both knees M25.561, M25.562   ??? Abdominal pain R10.9   ??? Peritoneal abscess (HCC) K65.1   ??? Intra-abdominal abscess (HCC) K65.1   ??? Hx of gastric ulcer Z87.19          Plan:                 1. Intra-abd abscess- on rocephin and drain   2. Diabetes- improving   3. Gout- worse, will inject tomorrow  4. Hypertension- adjust meds if trending higher                ___________________________________________________    Attending Physician: Lake BellsNancy J Jemya Depierro, MD

## 2017-08-09 NOTE — Op Note (Signed)
[facility]  OPERATIVE REPORT    Name:Dana [name], Dana [name].  MR#: [id number]  DOB: Jan 12, 1956  ACCOUNT #: [id number]   DATE OF SERVICE: 08/09/2017    PREOPERATIVE DIAGNOSES:  Intraabdominal abscess and abdominal wall abscess.    POSTOPERATIVE DIAGNOSES:  Intraabdominal abscess and abdominal wall abscess.    PROCEDURE PERFORMED:  Abdominal drain replacement with irrigation of abdominal fistula tract.    SURGEON:  Dana CockingSihong Irby Fails, MD    ASSISTANRenita Papa:  [name].     ANESTHESIA:  General anesthesia.    ESTIMATED BLOOD LOSS:  Minimal.    SPECIMENS REMOVED:  None.    FINDINGS:  There was an abscess pocket encountered along the fistula tract, which was opened, and approximately 5 mL of purulent fluid was expressed.  The fistula tract extends from the right abdomen to the peritoneal cavity.    COMPLICATIONS:  None.    IMPLANTS:  None.    DRAINS AND TUBES:  A 1/4-inch Penrose drain was placed into the superficial surface of the peritoneal cavity and sutured to the abdominal wall using 2-0 nylon.    INDICATION FOR OPERATION:  The patient is a 61 year old woman who had a complicated course of perforated gastric ulcer.  The patient had a gastric ulcer repair done by Dr. Amada [name] in 05/2017.  She had a complicated course secondary to fistulization and abscess of the perforation site.  She was admitted to the hospital for IV antibiotic and also for management of the fistula.  A pigtail drain was then placed to assist with drainage of the abscess; however, there was a colonic injury during the placement of the pigtail drain.  The patient's repeat CT scan shows a small abscess pocket; however, the pigtail drain was causing significant pain.  She was taken to the operating room for replacement of the pigtail drain to a Penrose drain as the pigtail drain was causing so much pain it was interfering with her daily functioning.    PROCEDURE IN DETAIL:  After informed consent was obtained from the  patient, the patient was taken to the operating room and placed in supine position on the operating room table.  She underwent general anesthesia with endotracheal intubation without any complications.  The abdomen was then prepped and draped in the usual sterile surgical fashion.  A surgical timeout was performed.  Preoperative antibiotic was given prior to skin incision.    After the timeout was performed, the pigtail was cut and was then removed.  There was a fistula tract extending into the peritoneal cavity.  This tract was gently massaged and we used a Kelly clamp to gently spread the wound tract open.  A 1 x 3 cm elliptical incision was made over the site of the excoriated fistula tract.  This was done using a 15 scalpel with electrocautery.  We then probed the wound with a Tresa EndoKelly.  The wound was bluntly dissected and an abscess pocket was then encountered along the fistula tract.  We drained approximately 5 mL of abscess from this site.  The wound was then copiously irrigated with bacitracin and saline.  After the wound was irrigated, we then brought into the operative field a Penrose drain.  A 1/4-inch Penrose drain was then guided with a Kelly clamp into the peritoneal cavity.  This was done bluntly and gently.  Once the Penrose had been placed, we then secured it with 2-0 nylon to the abdominal wall.  The wound was then pouched with an ostomy appliance.  The patient tolerated the procedure well.  There were no complications associated with the operation.  Dr. Kandis CockingSihong Elba [name], the attending surgeon, was present during the entirety of the case.  All lap, needle, and instrument counts were correct x2.  The patient was successfully extubated and was then transported to PACU in stable condition.      Dana CockingSIHONG Chanel Mckesson, MD       SS / DN  [name]: 08/16/2017 15:47     T: 08/16/2017 17:23  JOB #: [id number]

## 2017-08-09 NOTE — Progress Notes (Signed)
I spoke with Kirt BoysMolly, NP and she said to hold the 20 units of Lantus this AM because the patient is going to surgery and is NPO.

## 2017-08-09 NOTE — Other (Signed)
Bedside shift change report given to Katie,RN (oncoming nurse) by Corinne Agnew,RN (offgoing nurse). Report included the following information SBAR, Kardex, Intake/Output, MAR and Recent Results.

## 2017-08-09 NOTE — Progress Notes (Signed)
Patient remains on IV ABX and has drainage catheter. Continuing to follow for will need resumption of home health at discharge.    [phone]

## 2017-08-09 NOTE — Progress Notes (Signed)
Progress Note    Patient: Dana [name] MRN: [id number]  SSN: [id number]    Date of Birth: 1956-01-18  Age: 61 y.o.  Sex: female      Admit Date: 07/29/2017    4 Days Post-Op    Procedure:  Procedure(s):  ESOPHAGOGASTRODUODENOSCOPY (EGD)  ESOPHAGOGASTRODUODENAL (EGD) BIOPSY    Subjective:     No acute surgical issues.  Awaiting OR for replacement of abdominal drain under anesthesia.      Objective:     Visit Vitals  BP 110/75 (BP 1 Location: Left arm, BP Patient Position: At rest)   Pulse 75   Temp 98 ??F (36.7 ??C)   Resp 16   Ht 5\' 4"  (1.626 m)   Wt 224 lb 8 oz (101.8 kg)   SpO2 95%   Breastfeeding? No   BMI 38.54 kg/m??       Temp (24hrs), Avg:98.2 ??F (36.8 ??C), Min:97.9 ??F (36.6 ??C), Max:98.4 ??F (36.9 ??C)        Physical Exam:    Gen:  NAD  Pulm:  Unlabored  Abd:  S/ND/appropriate TTP  Wound:  With seropurulent drainage around drain    Recent Results (from the past 24 hour(s))   GLUCOSE, POC    Collection Time: 08/08/17 12:42 PM   Result Value Ref Range    Glucose (POC) 139 (H) 65 - 100 mg/dL    Performed by Marlinda MikeBURLEE HILL GIBSON    GLUCOSE, POC    Collection Time: 08/08/17  5:27 PM   Result Value Ref Range    Glucose (POC) 174 (H) 65 - 100 mg/dL    Performed by Marlinda MikeBURLEE HILL GIBSON    GLUCOSE, POC    Collection Time: 08/08/17 10:26 PM   Result Value Ref Range    Glucose (POC) 190 (H) 65 - 100 mg/dL    Performed by AGNEW CONNIE    GLUCOSE, POC    Collection Time: 08/09/17  6:47 AM   Result Value Ref Range    Glucose (POC) 175 (H) 65 - 100 mg/dL    Performed by AGNEW CONNIE    GLUCOSE, POC    Collection Time: 08/09/17 11:44 AM   Result Value Ref Range    Glucose (POC) 149 (H) 65 - 100 mg/dL    Performed by Dwana Melenaabney Katherine            Assessment:     Hospital Problems  Date Reviewed: 07/13/2017          Codes Class Noted POA    * (Principal) Intra-abdominal abscess (HCC) ICD-10-CM: K65.1  ICD-9-CM: 567.22  07/29/2017 Unknown        Hx of gastric ulcer ICD-10-CM: Z87.19  ICD-9-CM: V12.79  07/29/2017 Unknown         Hypertension complicating diabetes (HCC) ICD-10-CM: E11.59, I10  ICD-9-CM: 250.80, 401.9  05/25/2016 Yes        Diabetes (HCC) ICD-10-CM: E11.9  ICD-9-CM: 250.00  01/24/2013 Yes              Plan/Recommendations/Medical Decision Making:     - Abdominal wound:  To OR for replacement of pigtail drain  - Labs in am  - continue antibiotic therapy  - NPO until after operation    Signed By: Kandis CockingSihong Theo Krumholz, MD     August 09, 2017

## 2017-08-09 NOTE — Telephone Encounter (Signed)
Louisa from [employer] needs to know the start date of patients work restriction please    Sallye OberLouise ph# [phone] ext. [phone]

## 2017-08-09 NOTE — Anesthesia Post-Procedure Evaluation (Signed)
Procedure(s):  ABDOMINAL DRAIN REPLACEMENT POSSIBLE EXPLORATORY LAPAROTOMY (WANTS MINOR TRAY)  REQ 1200 NOON.    Anesthesia Post Evaluation        Patient location during evaluation: PACU  Patient participation: complete - patient participated  Level of consciousness: awake and alert  Pain management: adequate  Airway patency: patent  Anesthetic complications: no  Cardiovascular status: acceptable  Respiratory status: acceptable  Hydration status: acceptable  Comments: I have seen and evaluated the patient and is ready for discharge. Chyrel MassonMichael D Jaeshaun Riva, MD    Post anesthesia nausea and vomiting:  none      Visit Vitals  BP 165/90   Pulse (!) 101   Temp 36.9 ??C (98.4 ??F)   Resp 14   Ht 5\' 4"  (1.626 m)   Wt 101.8 kg (224 lb 8 oz)   SpO2 97%   Breastfeeding? No   BMI 38.54 kg/m??

## 2017-08-09 NOTE — Progress Notes (Signed)
Bedside and Verbal shift change report given to Gibson, RN (oncoming nurse) by Katherine Dabney, RN (offgoing nurse). Report included the following information SBAR, Kardex, Intake/Output, MAR, Accordion, Recent Results and Med Rec Status.

## 2017-08-09 NOTE — Progress Notes (Signed)
Dr. Cherly HensenPahle called and asked me to place orders for Kenolog and Lidocaine to be ordered for tomorrow. She will be at the patient's bedside in the early AM to inject the patient. She also needs 1.5 inch gauge needle, 3 cc syringe, alcohol, betadine, gauze, and a bandaid to be placed in the room.

## 2017-08-09 NOTE — Progress Notes (Signed)
NUTRITION COMPLETE ASSESSMENT    RECOMMENDATIONS:   1. Resume diet after drain placement  2. Daily MVI  3. Weigh daily on standing scale     Interventions/Plan:   Food/Nutrient Delivery:  (continue current diet) (-)     (-)    Assessment:   Reason for Assessment: [x] Reassessment    Diet: (low fiber)  Supplements: Glucerna TID  Nutritionally Significant Medications: [x]  Reviewed & Includes: ceftriaxone, lantus (24 units), SSI, flagyl, protonix, zoloft, LR @ 19m/hr   Meal Intake:   Patient Vitals for the past 100 hrs:   % Diet Eaten   08/07/17 1737 100 %   08/07/17 1245 100 %   08/07/17 0842 50 %   08/06/17 1758 75 %   08/06/17 1347 100 %   08/06/17 0928 100 %   08/05/17 1824 100 %   08/05/17 1325 50 %     Subjective: Pt off the floor. Spoke with visitor who confirms pt with good appetite and enjoying food.     Objective:  Pt admitted for peritoneal abscess. PMHx: gastric ulcer with perforation, DM, HTN, hypercholesterolemia. Recent admit for same with ulcer repair about 9 weeks ago (05/2017). Readmitted for abscess with drainage around drain site. Surgery following. S/p drain adjustment on 11/24 with perforation of colon at that time. Plans for replacement of drain in OR noted.     PPN 11/25-11/29.  Diet advanced and appetite has been good (see above). Glucerna in place TID (660kcal, 30g protein) - since eating well will reduce Glucerna to just 1x/day (220kcal, 10g protein). BG well controlled in with current insulin.     Wt loss of 11% x 5 months noted. Weight stable this admit.   Wt Readings:   08/08/17 101.8 kg (224 lb 8 oz)   07/14/17 106 kg (233 lb 9.6 oz)   07/01/17 105.2 kg (232 lb)   05/16/17 115.3 kg (254 lb 3.1 oz)   03/17/17 113.4 kg (250 lb)     Will continue to follow diet advancement vs TPN, PO intake and supplements, wt trends.   Estimated Nutrition Needs:   Kcals/day: 11610Kcals/day(1613-1760kcal)  Protein: 110 g(1.2g/kg)  Fluid: 1800 ml(166mkcal)  Based On: [place] St Jeor(x 1.1-1.2)   Weight Used: Actual wt(91.5kg)    Pt expected to meet estimated nutrient needs:  [x]    Yes     []   No  []  Unable to predict at this time  Nutrition Diagnosis:   1. Inadequate protein-energy intake(resolved) related to altered GI fx as evidenced by peritoneal abcess s/p colon perf from drain; wt loss x 4 months; now consuming >75% meals      Goals:     Continued consumption of at least 75% meals      Monitoring & Evaluation:    - Total energy intake   - Weight/weight change    Previous Nutrition Goals Met:   Yes  Previous Recommendations:    N/A     Education & Discharge Needs:   [x]  None Identified   []  Identified and addressed    []  Participated in care plan, discharge planning, and/or interdisciplinary rounds        Cultural, religious and ethnic food preferences identified: None    Skin Integrity: [x] Intact  [] Other  Edema: [x] None [] Other  Last BM: 12/3 - loose  Food Allergies: [x] None [] Other  Diet Restrictions: Cultural/Religious Preference(s): None      Anthropometrics:    Weight Loss Metrics 08/08/2017 07/29/2017 07/29/2017 07/29/2017 07/17/2017 07/14/2017 07/01/2017   Today's  Wt 224 lb 8 oz - 220 lb 8 oz - 233 lb 11 oz 233 lb 9.6 oz 232 lb   BMI - 38.54 kg/m2 - 37.85 kg/m2 40.11 kg/m2 40.1 kg/m2 39.82 kg/m2      Weight Source: Bed  Height: 5' 4"  (162.6 cm),    Body mass index is 38.54 kg/m??.   ,     ,      Labs:    Lab Results   Component Value Date/Time    Sodium 139 08/02/2017 07:00 AM    Potassium 4.0 08/02/2017 07:00 AM    Chloride 106 08/02/2017 07:00 AM    CO2 20 (L) 08/02/2017 07:00 AM    Glucose 165 (H) 08/02/2017 07:00 AM    BUN 9 08/02/2017 07:00 AM    Creatinine 0.46 (L) 08/02/2017 07:00 AM    Calcium 8.4 (L) 08/02/2017 07:00 AM    Magnesium 1.6 07/31/2017 12:30 AM    Phosphorus 3.9 07/31/2017 12:30 AM    Albumin 2.4 (L) 07/30/2017 02:50 AM     Lab Results   Component Value Date/Time    Hemoglobin A1c 7.3 (H) 05/13/2017 03:04 AM    Hemoglobin A1c (POC) 8.4 (A) 07/01/2017 02:50 PM      Dorette Grate, RD Pager [phone] or [phone]

## 2017-08-09 NOTE — Op Note (Signed)
[facility]  OPERATIVE REPORT    Name:Ashurst, Hennie D.  MR#: [id number]  DOB: Oct 15, 1955  ACCOUNT #: [id number]   DATE OF SERVICE: 08/09/2017    PREOPERATIVE DIAGNOSES:  Intraabdominal abscess and abdominal wall abscess.    POSTOPERATIVE DIAGNOSES:  Intraabdominal abscess and abdominal wall abscess.    PROCEDURE PERFORMED:  Abdominal drain replacement with irrigation of abdominal fistula tract.    SURGEON:  Kandis CockingSihong Nolah Krenzer, MD    ASSISTANRenita Papa:  Caitlyn Holdsworth.     ANESTHESIA:  General anesthesia.    ESTIMATED BLOOD LOSS:  Minimal.    SPECIMENS REMOVED:  None.    FINDINGS:  There was an abscess pocket encountered along the fistula tract, which was opened, and approximately 5 mL of purulent fluid was expressed.  The fistula tract extends from the right abdomen to the peritoneal cavity.    COMPLICATIONS:  None.    IMPLANTS:  None.    DRAINS AND TUBES:  A 1/4-inch Penrose drain was placed into the superficial surface of the peritoneal cavity and sutured to the abdominal wall using 2-0 nylon.    INDICATION FOR OPERATION:  The patient is a 61 year old woman who had a complicated course of perforated gastric ulcer.  The patient had a gastric ulcer repair done by Dr. Amada JupiterShindel in 05/2017.  She had a complicated course secondary to fistulization and abscess of the perforation site.  She was admitted to the hospital for IV antibiotic and also for management of the fistula.  A pigtail drain was then placed to assist with drainage of the abscess; however, there was a colonic injury during the placement of the pigtail drain.  The patient's repeat CT scan shows a small abscess pocket; however, the pigtail drain was causing significant pain.  She was taken to the operating room for replacement of the pigtail drain to a Penrose drain as the pigtail drain was causing so much pain it was interfering with her daily functioning.    PROCEDURE IN DETAIL:  After informed consent was obtained from the patient, the patient was  taken to the operating room and placed in supine position on the operating room table.  She underwent general anesthesia with endotracheal intubation without any complications.  The abdomen was then prepped and draped in the usual sterile surgical fashion.  A surgical timeout was performed.  Preoperative antibiotic was given prior to skin incision.    After the timeout was performed, the pigtail was cut and was then removed.  There was a fistula tract extending into the peritoneal cavity.  This tract was gently massaged and we used a Kelly clamp to gently spread the wound tract open.  A 1 x 3 cm elliptical incision was made over the site of the excoriated fistula tract.  This was done using a 15 scalpel with electrocautery.  We then probed the wound with a Tresa EndoKelly.  The wound was bluntly dissected and an abscess pocket was then encountered along the fistula tract.  We drained approximately 5 mL of abscess from this site.  The wound was then copiously irrigated with bacitracin and saline.  After the wound was irrigated, we then brought into the operative field a Penrose drain.  A 1/4-inch Penrose drain was then guided with a Kelly clamp into the peritoneal cavity.  This was done bluntly and gently.  Once the Penrose had been placed, we then secured it with 2-0 nylon to the abdominal wall.  The wound was then pouched with an ostomy appliance.  The patient tolerated the procedure well.  There were no complications associated with the operation.  Dr. Kandis CockingSihong Yakov Bergen, the attending surgeon, was present during the entirety of the case.  All lap, needle, and instrument counts were correct x2.  The patient was successfully extubated and was then transported to PACU in stable condition.      Kandis CockingSIHONG Shevaun Lovan, MD       SS / DN  D: 08/16/2017 15:47     T: 08/16/2017 17:23  JOB #: [id number]

## 2017-08-10 LAB — CBC W/O DIFF
ABSOLUTE NRBC: 0 10*3/uL (ref 0.00–0.01)
HCT: 34.4 % — ABNORMAL LOW (ref 35.0–47.0)
HGB: 10.5 g/dL — ABNORMAL LOW (ref 11.5–16.0)
MCH: 28 PG (ref 26.0–34.0)
MCHC: 30.5 g/dL (ref 30.0–36.5)
MCV: 91.7 FL (ref 80.0–99.0)
MPV: 9.8 FL (ref 8.9–12.9)
NRBC: 0 PER 100 WBC
PLATELET: 207 10*3/uL (ref 150–400)
RBC: 3.75 M/uL — ABNORMAL LOW (ref 3.80–5.20)
RDW: 15 % — ABNORMAL HIGH (ref 11.5–14.5)
WBC: 6.1 10*3/uL (ref 3.6–11.0)

## 2017-08-10 LAB — METABOLIC PANEL, BASIC
Anion gap: 7 mmol/L (ref 5–15)
BUN/Creatinine ratio: 13 (ref 12–20)
BUN: 8 MG/DL (ref 6–20)
CO2: 30 mmol/L (ref 21–32)
Calcium: 8.2 MG/DL — ABNORMAL LOW (ref 8.5–10.1)
Chloride: 102 mmol/L (ref 97–108)
Creatinine: 0.61 MG/DL (ref 0.55–1.02)
GFR est AA: >60 mL/min/{1.73_m2} (ref >60)
GFR est non-AA: >60 mL/min/{1.73_m2} (ref >60)
Glucose: 212 mg/dL — ABNORMAL HIGH (ref 65–100)
Potassium: 3.4 mmol/L — ABNORMAL LOW (ref 3.5–5.1)
Sodium: 139 mmol/L (ref 136–145)

## 2017-08-10 LAB — GLUCOSE, POC
Glucose (POC): 187 mg/dL — ABNORMAL HIGH (ref 65–100)
Glucose (POC): 146 mg/dL — ABNORMAL HIGH (ref 65–100)
Glucose (POC): 212 mg/dL — ABNORMAL HIGH (ref 65–100)
Glucose (POC): 168 mg/dL — ABNORMAL HIGH (ref 65–100)

## 2017-08-10 MED ORDER — HYDROMORPHONE 2 MG TAB
2 mg | ORAL | Status: DC | PRN
Start: 2017-08-10 — End: 2017-08-11
  Administered 2017-08-10: 16:00:00 via ORAL

## 2017-08-10 MED FILL — NORMAL SALINE FLUSH 0.9 % INJECTION SYRINGE: INTRAMUSCULAR | Qty: 40

## 2017-08-10 MED FILL — HYDROMORPHONE 2 MG/ML INJECTION SOLUTION: 2 mg/mL | INTRAMUSCULAR | Qty: 1

## 2017-08-10 MED FILL — LANTUS U-100 INSULIN 100 UNIT/ML SUBCUTANEOUS SOLUTION: 100 unit/mL | SUBCUTANEOUS | Qty: 0.24

## 2017-08-10 MED FILL — PANTOPRAZOLE 40 MG TAB, DELAYED RELEASE: 40 mg | ORAL | Qty: 1

## 2017-08-10 MED FILL — HYDROMORPHONE 2 MG TAB: 2 mg | ORAL | Qty: 1

## 2017-08-10 MED FILL — NORMAL SALINE FLUSH 0.9 % INJECTION SYRINGE: INTRAMUSCULAR | Qty: 10

## 2017-08-10 MED FILL — ZOLPIDEM 5 MG TAB: 5 mg | ORAL | Qty: 1

## 2017-08-10 MED FILL — METRONIDAZOLE 250 MG TAB: 250 mg | ORAL | Qty: 2

## 2017-08-10 MED FILL — INSULIN LISPRO 100 UNIT/ML INJECTION: 100 unit/mL | SUBCUTANEOUS | Qty: 1

## 2017-08-10 MED FILL — COLCHICINE 0.6 MG TAB: 0.6 mg | ORAL | Qty: 1

## 2017-08-10 MED FILL — AMLODIPINE 5 MG TAB: 5 mg | ORAL | Qty: 1

## 2017-08-10 MED FILL — CEFTRIAXONE 1 GRAM SOLUTION FOR INJECTION: 1 gram | INTRAMUSCULAR | Qty: 1

## 2017-08-10 MED FILL — DIPRIVAN 10 MG/ML INTRAVENOUS EMULSION: 10 mg/mL | INTRAVENOUS | Qty: 15

## 2017-08-10 MED FILL — TRIAMCINOLONE ACETONIDE 40 MG/ML SUSP FOR INJECTION: 40 mg/mL | INTRAMUSCULAR | Qty: 1

## 2017-08-10 MED FILL — ROCURONIUM 10 MG/ML IV: 10 mg/mL | INTRAVENOUS | Qty: 1

## 2017-08-10 MED FILL — SERTRALINE 50 MG TAB: 50 mg | ORAL | Qty: 2

## 2017-08-10 MED FILL — XYLOCAINE-MPF 20 MG/ML (2 %) INJECTION SOLUTION: 20 mg/mL (2 %) | INTRAMUSCULAR | Qty: 3

## 2017-08-10 MED FILL — LIDOCAINE HCL 1 % (10 MG/ML) IJ SOLN: 10 mg/mL (1 %) | INTRAMUSCULAR | Qty: 10

## 2017-08-10 MED FILL — TRIAMTERENE-HYDROCHLOROTHIAZIDE 37.5 MG-25 MG TAB: ORAL | Qty: 1

## 2017-08-10 MED FILL — QUELICIN 20 MG/ML INJECTION SOLUTION: 20 mg/mL | INTRAMUSCULAR | Qty: 6

## 2017-08-10 MED FILL — ONDANSETRON (PF) 4 MG/2 ML INJECTION: 4 mg/2 mL | INTRAMUSCULAR | Qty: 2

## 2017-08-10 NOTE — Wound Image (Signed)
Consult for education for home care with Penrose drain and pouch application.   Will plan to change pouch tomorrow prior to discharge and will teach her how to pouch at that time.  Will also send her with supplies to last about 1 month and give her ordering information.   Rodman PickleJulie Georgeann Brinkman, CWOCN

## 2017-08-10 NOTE — Progress Notes (Signed)
Medical Progress Note      NAME: Neva SeatRebecca D Devincenzi   DOB:  1956/07/25  MRM:  [id number]    Date/Time: 08/10/2017  8:58 AM    Problem List:   Principal Problem:    Intra-abdominal abscess (HCC) (07/29/2017)    Active Problems:    Diabetes (HCC) (01/24/2013)      Hypertension complicating diabetes (HCC) (05/25/2016)      Hx of gastric ulcer (07/29/2017)           Subjective:     Patient feeling ok, thinks foot is better    Past Medical History:   Diagnosis Date   ??? Atherosclerosis of abdominal aorta (HCC)    ??? Diabetes (HCC) 01/24/2013   ??? GERD (gastroesophageal reflux disease)    ??? Gout    ??? Hives    ??? HTN (hypertension)    ??? Hypercholesterolemia    ??? Perforated gastric ulcer (HCC)    ??? Postprocedural intraabdominal abscess        ROS:  General: negative for fever, chills, sweats, weakness  Respiratory:  negative for cough, sputum production, SOB, wheezing, DOE, pleuritic pain  Cardiology:  negative for chest pain, palpitations, orthopnea, PND, edema, syncope   Gastrointestinal: negative for abdominal pain, N/V, dysphagia, change in bowel habits, bleeding         Objective:       Vitals:          Last 24hrs VS reviewed since prior progress note. Most recent are:    Visit Vitals  BP 120/81 (BP 1 Location: Left arm, BP Patient Position: Head of bed elevated (Comment degrees))   Pulse 81   Temp 98.1 ??F (36.7 ??C)   Resp 18   Ht 5\' 4"  (1.626 m)   Wt 224 lb (101.6 kg)   SpO2 96%   Breastfeeding? No   BMI 38.45 kg/m??     SpO2 Readings from Last 6 Encounters:   08/10/17 96%   07/29/17 94%   07/26/17 96%   07/14/17 96%   06/09/17 98%   05/23/17 98%    O2 Flow Rate (L/min): 2 l/min       Intake/Output Summary (Last 24 hours) at 08/10/2017 0858  Last data filed at 08/10/2017 0148  Gross per 24 hour   Intake 550 ml   Output 1020 ml   Net -470 ml          Exam:     General   well developed, well nourished, appears stated age, in no acute distress  Respiratory   Clear To Auscultation bilaterally - no wheezes, rales, rhonchi, or crackles   Cardiology  regular  Abdominal  Drain intact  Left foot- sl erythematous    Lab Data Reviewed: (see below)    Medications Reviewed: (see below)    ______________________________________________________________________    Medications:     Current Facility-Administered Medications   Medication Dose Route Frequency   ??? triamcinolone acetonide (KENALOG-40) 40 mg/mL injection 20 mg  20 mg Intra artICUlar ONCE   ??? lidocaine (XYLOCAINE) 10 mg/mL (1 %) injection 1 mL  1 mL Intra artICUlar ONCE   ??? insulin glargine (LANTUS) injection 24 Units  24 Units SubCUTAneous DAILY   ??? colchicine tablet 0.6 mg  0.6 mg Oral BID PRN   ??? metroNIDAZOLE (FLAGYL) tablet 500 mg  500 mg Oral Q12H   ??? pantoprazole (PROTONIX) tablet 40 mg  40 mg Oral Q12H   ??? lactated Ringers infusion  50 mL/hr IntraVENous CONTINUOUS   ???  sertraline (ZOLOFT) tablet 100 mg  100 mg Oral DAILY   ??? sodium chloride (NS) flush 5-10 mL  5-10 mL IntraVENous Q8H   ??? sodium chloride (NS) flush 5-10 mL  5-10 mL IntraVENous PRN   ??? glucose chewable tablet 16 g  4 Tab Oral PRN   ??? dextrose (D50W) injection syrg 12.5-25 g  12.5-25 g IntraVENous PRN   ??? glucagon (GLUCAGEN) injection 1 mg  1 mg IntraMUSCular PRN   ??? ondansetron (ZOFRAN) injection 4 mg  4 mg IntraVENous Q6H PRN   ??? diphenhydrAMINE (BENADRYL) 12.5 mg/5 mL oral elixir 12.5 mg  12.5 mg Oral Q6H PRN   ??? HYDROmorphone (DILAUDID) injection 1 mg  1 mg IntraVENous Q3H PRN   ??? amLODIPine (NORVASC) tablet 5 mg  5 mg Oral DAILY   ??? triamterene-hydroCHLOROthiazide (MAXZIDE) 37.5-25 mg per tablet 1 Tab  1 Tab Oral DAILY   ??? LORazepam (ATIVAN) tablet 1 mg  1 mg Oral Q8H PRN   ??? cefTRIAXone (ROCEPHIN) 1 g in 0.9% sodium chloride (MBP/ADV) 50 mL  1 g IntraVENous Q12H   ??? acetaminophen (TYLENOL) tablet 650 mg  650 mg Oral Q4H PRN   ??? insulin lispro (HUMALOG) injection   SubCUTAneous AC&HS   ??? zolpidem (AMBIEN) tablet 5 mg  5 mg Oral QHS            Lab Review:      No results for input(s): WBC, HGB, HCT, PLT, HGBEXT, HCTEXT, PLTEXT in the last 72 hours.  No results for input(s): NA, K, CL, CO2, GLU, BUN, CREA, CA, MG, PHOS, ALB, TBIL, TBILI, SGOT, ALT, INR in the last 72 hours.    No lab exists for component: INREXT  Lab Results   Component Value Date/Time    Glucose (POC) 146 (H) 08/10/2017 06:04 AM    Glucose (POC) 187 (H) 08/09/2017 08:51 PM    Glucose (POC) 124 (H) 08/09/2017 04:40 PM    Glucose (POC) 149 (H) 08/09/2017 11:44 AM    Glucose (POC) 175 (H) 08/09/2017 06:47 AM     No results for input(s): PH, PCO2, PO2, HCO3, FIO2 in the last 72 hours.  No results for input(s): INR in the last 72 hours.    No lab exists for component: INREXT    Other pertinent lab: NA         Assessment:     Patient Active Problem List   Diagnosis Code   ??? Hypercholesterolemia E78.00   ??? Diabetes (HCC) E11.9   ??? Sleep apnea G47.30   ??? Hypertension complicating diabetes (HCC) E11.59, I10   ??? Morbid obesity due to excess calories (HCC) E66.01   ??? Hives L50.9   ??? Acute gastric ulcer with perforation (HCC) K25.1   ??? Arthralgia of both knees M25.561, M25.562   ??? Abdominal pain R10.9   ??? Peritoneal abscess (HCC) K65.1   ??? Intra-abdominal abscess (HCC) K65.1   ??? Hx of gastric ulcer Z87.19          Plan:                 1. Intra -abd abscess- now with soft drain and pouch  2. Diabetes- controlled  3. Gout- improving on colchicine, hold injection                ___________________________________________________    Attending Physician: Lake BellsNancy J Brindle Leyba, MD

## 2017-08-10 NOTE — Progress Notes (Signed)
I agree with Sara Cole's charting

## 2017-08-10 NOTE — Progress Notes (Signed)
Patient told me that she had a bath today even though there is not one charted

## 2017-08-10 NOTE — Progress Notes (Signed)
Bedside shift change report given to Sabrina (oncoming nurse) by Gibson (offgoing nurse). Report included the following information SBAR, Kardex and MAR.

## 2017-08-10 NOTE — Progress Notes (Signed)
Progress Note    Patient: Dana SeatRebecca D Corey MRN: [id number]  SSN: [id number]    Date of Birth: [birth year]  Age: 61 y.o.  Sex: female      Admit Date: 07/29/2017    1 Day Post-Op    Procedure:  Procedure(s):  ABDOMINAL DRAIN REPLACEMENT POSSIBLE EXPLORATORY LAPAROTOMY (WANTS MINOR TRAY)  REQ 1200 NOON    Subjective:     No acute surgical issues.  Pt doing better.  Tolerating diet without nausea or vomiting.  Pain is under control.    Objective:     Visit Vitals  BP 124/78   Pulse 81   Temp 98 ??F (36.7 ??C)   Resp 18   Ht 5\' 4"  (1.626 m)   Wt 224 lb (101.6 kg)   SpO2 98%   Breastfeeding? No   BMI 38.45 kg/m??       Temp (24hrs), Avg:98.3 ??F (36.8 ??C), Min:98 ??F (36.7 ??C), Max:98.9 ??F (37.2 ??C)        Physical Exam:    Gen:  NAD  Pulm:  Unlabored  Abd:  S/ND/appropriate TTP  Wound:  Clean with serosanguinous fluid    Recent Results (from the past 24 hour(s))   GLUCOSE, POC    Collection Time: 08/09/17 11:44 AM   Result Value Ref Range    Glucose (POC) 149 (H) 65 - 100 mg/dL    Performed by Dwana Melenaabney Katherine    GLUCOSE, POC    Collection Time: 08/09/17  4:40 PM   Result Value Ref Range    Glucose (POC) 124 (H) 65 - 100 mg/dL    Performed by Jamison OkaStephanie Sill    GLUCOSE, POC    Collection Time: 08/09/17  8:51 PM   Result Value Ref Range    Glucose (POC) 187 (H) 65 - 100 mg/dL    Performed by Valeda MalmHolmes Regina    GLUCOSE, POC    Collection Time: 08/10/17  6:04 AM   Result Value Ref Range    Glucose (POC) 146 (H) 65 - 100 mg/dL    Performed by [facility] KRISTI          Assessment:     Hospital Problems  Date Reviewed: 08/09/2017          Codes Class Noted POA    * (Principal) Intra-abdominal abscess (HCC) ICD-10-CM: K65.1  ICD-9-CM: 567.22  07/29/2017 Unknown        Hx of gastric ulcer ICD-10-CM: Z87.19  ICD-9-CM: V12.79  07/29/2017 Unknown        Hypertension complicating diabetes (HCC) ICD-10-CM: E11.59, I10  ICD-9-CM: 250.80, 401.9  05/25/2016 Yes        Diabetes (HCC) ICD-10-CM: E11.9  ICD-9-CM: 250.00  01/24/2013 Yes               Plan/Recommendations/Medical Decision Making:     - Diet as tolerated  - Switch to oral pain medication with IV for breakthrough pain  - Repeat labs today  - Hopefully can DC home tomorrow  - Repeat CT scan in 1-2 weeks to evaluate for resolution    Signed By: Kandis CockingSihong Naoko Diperna, MD     August 10, 2017

## 2017-08-11 ENCOUNTER — Ambulatory Visit: Payer: BLUE CROSS/BLUE SHIELD | Primary: Internal Medicine

## 2017-08-11 LAB — GLUCOSE, POC
Glucose (POC): 159 mg/dL — ABNORMAL HIGH (ref 65–100)
Glucose (POC): 169 mg/dL — ABNORMAL HIGH (ref 65–100)
Glucose (POC): 229 mg/dL — ABNORMAL HIGH (ref 65–100)

## 2017-08-11 MED ORDER — COLCHICINE 0.6 MG TAB
0.6 mg | ORAL_TABLET | ORAL | 0 refills | Status: DC
Start: 2017-08-11 — End: 2017-08-19

## 2017-08-11 MED ORDER — HYDROMORPHONE 2 MG TAB
2 mg | ORAL_TABLET | ORAL | 0 refills | Status: DC | PRN
Start: 2017-08-11 — End: 2018-03-23

## 2017-08-11 MED ORDER — DOCUSATE SODIUM 100 MG CAP
100 mg | ORAL_CAPSULE | Freq: Two times a day (BID) | ORAL | 0 refills | Status: DC | PRN
Start: 2017-08-11 — End: 2018-03-23

## 2017-08-11 MED ORDER — AMOXICILLIN CLAVULANATE 875 MG-125 MG TAB
875-125 mg | ORAL_TABLET | Freq: Two times a day (BID) | ORAL | 0 refills | Status: AC
Start: 2017-08-11 — End: 2017-08-21

## 2017-08-11 MED FILL — LANTUS U-100 INSULIN 100 UNIT/ML SUBCUTANEOUS SOLUTION: 100 unit/mL | SUBCUTANEOUS | Qty: 0.24

## 2017-08-11 MED FILL — METRONIDAZOLE 250 MG TAB: 250 mg | ORAL | Qty: 2

## 2017-08-11 MED FILL — HYDROMORPHONE 2 MG/ML INJECTION SOLUTION: 2 mg/mL | INTRAMUSCULAR | Qty: 1

## 2017-08-11 MED FILL — PANTOPRAZOLE 40 MG TAB, DELAYED RELEASE: 40 mg | ORAL | Qty: 1

## 2017-08-11 MED FILL — CEFTRIAXONE 1 GRAM SOLUTION FOR INJECTION: 1 gram | INTRAMUSCULAR | Qty: 1

## 2017-08-11 MED FILL — NORMAL SALINE FLUSH 0.9 % INJECTION SYRINGE: INTRAMUSCULAR | Qty: 10

## 2017-08-11 MED FILL — INSULIN LISPRO 100 UNIT/ML INJECTION: 100 unit/mL | SUBCUTANEOUS | Qty: 1

## 2017-08-11 MED FILL — AMLODIPINE 5 MG TAB: 5 mg | ORAL | Qty: 1

## 2017-08-11 MED FILL — COLCHICINE 0.6 MG TAB: 0.6 mg | ORAL | Qty: 1

## 2017-08-11 MED FILL — TRIAMTERENE-HYDROCHLOROTHIAZIDE 37.5 MG-25 MG TAB: ORAL | Qty: 1

## 2017-08-11 MED FILL — SERTRALINE 50 MG TAB: 50 mg | ORAL | Qty: 2

## 2017-08-11 MED FILL — ZOLPIDEM 5 MG TAB: 5 mg | ORAL | Qty: 1

## 2017-08-11 NOTE — Progress Notes (Signed)
Medical Progress Note      NAME: Dana [name]   DOB:  [birth year]  MRM:  [id number]    Date/Time: 08/11/2017  9:34 AM    Problem List:   Principal Problem:    Intra-abdominal abscess (HCC) (07/29/2017)    Active Problems:    Diabetes (HCC) (01/24/2013)      Hypertension complicating diabetes (HCC) (05/25/2016)      Hx of gastric ulcer (07/29/2017)           Subjective:     Patient feeling better.  Foot not as sore    Past Medical History:   Diagnosis Date   ??? Atherosclerosis of abdominal aorta (HCC)    ??? Diabetes (HCC) 01/24/2013   ??? GERD (gastroesophageal reflux disease)    ??? Gout    ??? Hives    ??? HTN (hypertension)    ??? Hypercholesterolemia    ??? Perforated gastric ulcer (HCC)    ??? Postprocedural intraabdominal abscess        ROS:  General: negative for fever, chills, sweats, weakness  Respiratory:  negative for cough, sputum production, SOB, wheezing, DOE, pleuritic pain  Cardiology:  negative for chest pain, palpitations, orthopnea, PND, edema, syncope   Gastrointestinal: negative for abdominal pain, N/V, dysphagia, change in bowel habits, bleeding         Objective:       Vitals:          Last 24hrs VS reviewed since prior progress note. Most recent are:    Visit Vitals  BP (!) 154/98 (BP 1 Location: Left arm, BP Patient Position: At rest)   Pulse 70   Temp 98.3 ??F (36.8 ??C)   Resp 16   Ht 5\' 4"  (1.626 m)   Wt 225 lb 14.4 oz (102.5 kg)   SpO2 95%   Breastfeeding? No   BMI 38.78 kg/m??     SpO2 Readings from Last 6 Encounters:   08/11/17 95%   07/29/17 94%   07/26/17 96%   07/14/17 96%   06/09/17 98%   05/23/17 98%    O2 Flow Rate (L/min): 2 l/min       Intake/Output Summary (Last 24 hours) at 08/11/2017 0934  Last data filed at 08/10/2017 1138  Gross per 24 hour   Intake ???   Output 820 ml   Net -820 ml          Exam:     General   well developed, well nourished, appears stated age, in no acute distress  Respiratory   Clear To Auscultation bilaterally - no wheezes, rales, rhonchi, or crackles  Cardiology  regular   Abdominal  Soft, non-tender, non-distended, positive bowel sounds, no hepatosplenomegaly  Extremities  Left foot- 3 cm erythematous tender area, good pulses    Lab Data Reviewed: (see below)    Medications Reviewed: (see below)    ______________________________________________________________________    Medications:     Current Facility-Administered Medications   Medication Dose Route Frequency   ??? HYDROmorphone (DILAUDID) tablet 2 mg  2 mg Oral Q4H PRN   ??? insulin glargine (LANTUS) injection 24 Units  24 Units SubCUTAneous DAILY   ??? colchicine tablet 0.6 mg  0.6 mg Oral BID PRN   ??? metroNIDAZOLE (FLAGYL) tablet 500 mg  500 mg Oral Q12H   ??? pantoprazole (PROTONIX) tablet 40 mg  40 mg Oral Q12H   ??? lactated Ringers infusion  50 mL/hr IntraVENous CONTINUOUS   ??? sertraline (ZOLOFT) tablet 100 mg  100 mg  Oral DAILY   ??? sodium chloride (NS) flush 5-10 mL  5-10 mL IntraVENous Q8H   ??? sodium chloride (NS) flush 5-10 mL  5-10 mL IntraVENous PRN   ??? glucose chewable tablet 16 g  4 Tab Oral PRN   ??? dextrose (D50W) injection syrg 12.5-25 g  12.5-25 g IntraVENous PRN   ??? glucagon (GLUCAGEN) injection 1 mg  1 mg IntraMUSCular PRN   ??? ondansetron (ZOFRAN) injection 4 mg  4 mg IntraVENous Q6H PRN   ??? diphenhydrAMINE (BENADRYL) 12.5 mg/5 mL oral elixir 12.5 mg  12.5 mg Oral Q6H PRN   ??? HYDROmorphone (DILAUDID) injection 1 mg  1 mg IntraVENous Q3H PRN   ??? amLODIPine (NORVASC) tablet 5 mg  5 mg Oral DAILY   ??? triamterene-hydroCHLOROthiazide (MAXZIDE) 37.5-25 mg per tablet 1 Tab  1 Tab Oral DAILY   ??? LORazepam (ATIVAN) tablet 1 mg  1 mg Oral Q8H PRN   ??? cefTRIAXone (ROCEPHIN) 1 g in 0.9% sodium chloride (MBP/ADV) 50 mL  1 g IntraVENous Q12H   ??? acetaminophen (TYLENOL) tablet 650 mg  650 mg Oral Q4H PRN   ??? insulin lispro (HUMALOG) injection   SubCUTAneous AC&HS   ??? zolpidem (AMBIEN) tablet 5 mg  5 mg Oral QHS            Lab Review:     Recent Labs     08/10/17  1130   WBC 6.1   HGB 10.5*   HCT 34.4*   PLT 207     Recent Labs      08/10/17  1130   NA 139   K 3.4*   CL 102   CO2 30   GLU 212*   BUN 8   CREA 0.61   CA 8.2*     Lab Results   Component Value Date/Time    Glucose (POC) 169 (H) 08/11/2017 06:07 AM    Glucose (POC) 159 (H) 08/10/2017 09:21 PM    Glucose (POC) 168 (H) 08/10/2017 04:41 PM    Glucose (POC) 212 (H) 08/10/2017 11:32 AM    Glucose (POC) 146 (H) 08/10/2017 06:04 AM     No results for input(s): PH, PCO2, PO2, HCO3, FIO2 in the last 72 hours.  No results for input(s): INR in the last 72 hours.    No lab exists for component: INREXT    Other pertinent lab: NA         Assessment:     Patient Active Problem List   Diagnosis Code   ??? Hypercholesterolemia E78.00   ??? Diabetes (HCC) E11.9   ??? Sleep apnea G47.30   ??? Hypertension complicating diabetes (HCC) E11.59, I10   ??? Morbid obesity due to excess calories (HCC) E66.01   ??? Hives L50.9   ??? Acute gastric ulcer with perforation (HCC) K25.1   ??? Arthralgia of both knees M25.561, M25.562   ??? Abdominal pain R10.9   ??? Peritoneal abscess (HCC) K65.1   ??? Intra-abdominal abscess (HCC) K65.1   ??? Hx of gastric ulcer Z87.19          Plan:                 1. Intra-abd abscess- draining in to pouch.    2. Diabetes- controlled  3. Hypertension- monitor  4. Gout- stable. Hold off on injecting steroids                ___________________________________________________    Attending Physician: Lake BellsNancy J Kyllie Pettijohn, MD

## 2017-08-11 NOTE — Other (Signed)
Patient's pain well controlled on prn medication, patient pleased with new drain appliance.    Bedside shift change report given to Vance GatherShelley RN (oncoming nurse) by Martie LeeSabrina RN (offgoing nurse). Report included the following information SBAR, Kardex, Intake/Output, MAR and Recent Results.

## 2017-08-11 NOTE — Progress Notes (Signed)
Patient has been cleared for discharge home. Upon interview patient felt confident with wound care and did not think home health was necessary any longer. Patient completed a request for her medical records and this was delivered to Medical records by Clinical research associatewriter.    Care Management Interventions  MyChart Signup: No  Physical Therapy Consult: No  Occupational Therapy Consult: No  Speech Therapy Consult: No  Current Support Network: Other(Lives with significant other)  Plan discussed with Pt/Family/Caregiver: Yes  Freedom of Choice Offered: Yes  Discharge Location  Discharge Placement: Home

## 2017-08-11 NOTE — Discharge Summary (Signed)
Physician Discharge Summary     Patient ID:  Dana [name]  [id number]  61 y.o.  [birth year]    Allergies: Adhesive and Dolobid [diflunisal]    Admit Date: 07/29/2017    Discharge Date:08/11/2017    * Admission Diagnoses: Peritoneal Abcess;Intra-abdominal abscess (HCC);Intra-abdominal abscess (HCC)    * Discharge Diagnoses:    Hospital Problems as of 08/11/2017 Date Reviewed: 08/09/2017          Codes Class Noted - Resolved POA    * (Principal) Intra-abdominal abscess (HCC) ICD-10-CM: K65.1  ICD-9-CM: 567.22  07/29/2017 - Present Unknown        Hx of gastric ulcer ICD-10-CM: Z87.19  ICD-9-CM: V12.79  07/29/2017 - Present Unknown        Hypertension complicating diabetes (HCC) ICD-10-CM: E11.59, I10  ICD-9-CM: 250.80, 401.9  05/25/2016 - Present Yes        Diabetes (HCC) ICD-10-CM: E11.9  ICD-9-CM: 250.00  01/24/2013 - Present Yes               Admission Condition: Fair    * Discharge Condition: improved    * Procedures: Procedure(s) with comments:  ABDOMINAL DRAIN REPLACEMENT POSSIBLE EXPLORATORY LAPAROTOMY (WANTS MINOR TRAY)  REQ 1200 NOON - Superficial debrid[facility] of [facility]rolized Tissue [facility]      * Hospital Course:   The patient presented to the office complaining of drainage around her PIgtail catheter from a previous intra-abdominal abscess- Ct showed that there was an increased collection. She was sent to IR who attempted to upsize the drain but ended up  entering the colon. She did not get septic from this. She was kept on abx,.NPO. She did ok from this. She was started slowly back on a diet. Repeat UGI did not show a leak. GI was consulted for a possible endo clipping. She had an EGD which showed that the ulcer had healed . She continued ot have pain at her drain site and so she was taken to the OR and had the pigtail changed to a penrose drain. She tolerated this well.Marland Kitchen. She was placed back on a diet and discharged home. During her stay she was followed by Dr. Cherly HensenPahle , her PCP and her team for  help in managing her chronic medical problems.     Consults: Gastroenterology and Internal Medicine    Significant Diagnostic Studies: radiology: Ct showing larger collection  UGI that showed no fistula    * Disposition: Home    Discharge Medications:   Discharge Medication List as of 08/11/2017  2:52 PM      START taking these medications    Details   amoxicillin-clavulanate (AUGMENTIN) 875-125 mg per tablet Take 1 Tab by mouth every twelve (12) hours for 10 days., Print, Disp-20 Tab, R-0      docusate sodium (COLACE) 100 mg capsule Take 1 Cap by mouth two (2) times daily as needed for Constipation., Print, Disp-30 Cap, R-0         CONTINUE these medications which have CHANGED    Details   HYDROmorphone (DILAUDID) 2 mg tablet Take 1 Tab by mouth every four (4) hours as needed for Pain. Max Daily Amount: 12 mg., Print, Disp-40 Tab, R-0      !! trimethoprim-sulfamethoxazole (BACTRIM DS, SEPTRA DS) 160-800 mg per tablet Take 1 Tab by mouth two (2) times a day for 10 days., Normal, Disp-20 Tab, R-0      !! trimethoprim-sulfamethoxazole (BACTRIM DS, SEPTRA DS) 160-800 mg per tablet Take 1 Tab by  mouth two (2) times a day for 10 days., Normal, Disp-20 Tab, R-0      !! trimethoprim-sulfamethoxazole (BACTRIM DS, SEPTRA DS) 160-800 mg per tablet Take 1 Tab by mouth two (2) times a day for 14 days., Print, Disp-28 Tab, R-0       !! - Potential duplicate medications found. Please discuss with provider.      CONTINUE these medications which have NOT CHANGED    Details   CARAFATE 100 mg/mL suspension TAKE 10 ML BY MOUTH FOUR (4) TIMES DAILY FOR 28 DAYS. INDICATIONS: GASTRIC ULCER, Normal, Disp-1120 mL, R-0      amLODIPine (NORVASC) 5 mg tablet Take 1 Tab by mouth daily., Normal, Disp-30 Tab, R-0      colchicine 0.6 mg tablet Take 1 Tab by mouth three (3) times daily., Normal, Disp-12 Tab, R-0      insulin NPH (HUMULIN N NPH U-100 INSULIN) 100 unit/mL injection 32 Units by SubCUTAneous route nightly., Historical Med       pantoprazole (PROTONIX) 40 mg tablet Take 1 Tab by mouth two (2) times a day., Normal, Disp-30 Tab, R-1      metFORMIN ER (GLUCOPHAGE XR) 500 mg tablet TAKE 1 TABLET EVERY MORNING AND 2 TABLETS WITH DINNER, Normal, Disp-270 Tab, R-2      traMADol (ULTRAM) 50 mg tablet Take 1 Tab by mouth every eight (8) hours as needed for Pain. Max Daily Amount: 150 mg., Print, Disp-30 Tab, R-0      sertraline (ZOLOFT) 100 mg tablet TAKE 1 TABLET BY MOUTH DAILY, Normal, Disp-90 Tab, R-3      triamterene-hydroCHLOROthiazide (MAXZIDE) 37.5-25 mg per tablet TAKE 1 TABLET DAILY, Normal, Disp-90 Tab, R-2             * Follow-up Care/Patient Instructions:  Activity: See surgical instructions  Diet: Diabetic Diet  Wound Care: As directed    Follow-up Information     Follow up With Specialties Details Why Contact Info    Hoy FinlayShindel, Jun Osment, MD General Surgery In 1 week  [address]  [address]  [place] [id number]  [phone]      Lake BellsPahle, Nancy J, MD Internal Medicine   [address]  [place] [address]  [phone]          Follow-up tests/labs     Signed:  Hoy FinlayStuart Jerrid Forgette, MD  08/14/2017  11:34 PM

## 2017-08-11 NOTE — Progress Notes (Signed)
Progress Note    Patient: Dana [name] MRN: [id number]  SSN: [id number]    Date of Birth: Jan 15, 1956  Age: 61 y.o.  Sex: female      Admit Date: 07/29/2017    2 Day Post-Op    Procedure:  Procedure(s):  ABDOMINAL DRAIN REPLACEMENT POSSIBLE EXPLORATORY LAPAROTOMY (WANTS MINOR TRAY)  REQ 1200 NOON    Subjective:     No acute surgical issues.  Pt doing well. Tolerating diet without nausea or vomiting.  Pain is under control.  Pt reported some gouty pain    Objective:     Visit Vitals  BP (!) 154/98 (BP 1 Location: Left arm, BP Patient Position: At rest)   Pulse 70   Temp 98.3 ??F (36.8 ??C)   Resp 16   Ht 5\' 4"  (1.626 m)   Wt 225 lb 14.4 oz (102.5 kg)   SpO2 95%   Breastfeeding? No   BMI 38.78 kg/m??       Temp (24hrs), Avg:98.2 ??F (36.8 ??C), Min:98.1 ??F (36.7 ??C), Max:98.3 ??F (36.8 ??C)        Physical Exam:    Gen:  NAD  Pulm:  Unlabored  Abd:  S/ND/appropriate TTP  Wound:  Clean with serosanguinous fluid    Recent Results (from the past 24 hour(s))   CBC W/O DIFF    Collection Time: 08/10/17 11:30 AM   Result Value Ref Range    WBC 6.1 3.6 - 11.0 K/uL    RBC 3.75 (L) 3.80 - 5.20 M/uL    HGB 10.5 (L) 11.5 - 16.0 g/dL    HCT 78.234.4 (L) 95.635.0 - 47.0 %    MCV 91.7 80.0 - 99.0 FL    MCH 28.0 26.0 - 34.0 PG    MCHC 30.5 30.0 - 36.5 g/dL    RDW 21.315.0 (H) 08.611.5 - 14.5 %    PLATELET 207 150 - 400 K/uL    MPV 9.8 8.9 - 12.9 FL    NRBC 0.0 0 PER 100 WBC    ABSOLUTE NRBC 0.00 0.00 - 0.01 K/uL   METABOLIC PANEL, BASIC    Collection Time: 08/10/17 11:30 AM   Result Value Ref Range    Sodium 139 136 - 145 mmol/L    Potassium 3.4 (L) 3.5 - 5.1 mmol/L    Chloride 102 97 - 108 mmol/L    CO2 30 21 - 32 mmol/L    Anion gap 7 5 - 15 mmol/L    Glucose 212 (H) 65 - 100 mg/dL    BUN 8 6 - 20 MG/DL    Creatinine 5.780.61 4.690.55 - 1.02 MG/DL    BUN/Creatinine ratio 13 12 - 20      GFR est AA >60 >60 ml/min/1.8673m2    GFR est non-AA >60 >60 ml/min/1.4073m2    Calcium 8.2 (L) 8.5 - 10.1 MG/DL   GLUCOSE, POC    Collection Time: 08/10/17 11:32 AM    Result Value Ref Range    Glucose (POC) 212 (H) 65 - 100 mg/dL    Performed by Helyn AppPOWELL  PATRICIA    GLUCOSE, POC    Collection Time: 08/10/17  4:41 PM   Result Value Ref Range    Glucose (POC) 168 (H) 65 - 100 mg/dL    Performed by Valeda MalmHolmes Regina    GLUCOSE, POC    Collection Time: 08/10/17  9:21 PM   Result Value Ref Range    Glucose (POC) 159 (H) 65 - 100 mg/dL    Performed  by Valeda MalmHolmes Regina    GLUCOSE, POC    Collection Time: 08/11/17  6:07 AM   Result Value Ref Range    Glucose (POC) 169 (H) 65 - 100 mg/dL    Performed by Tobias AlexanderMiller Katarina          Assessment:     Hospital Problems  Date Reviewed: 08/09/2017          Codes Class Noted POA    * (Principal) Intra-abdominal abscess (HCC) ICD-10-CM: K65.1  ICD-9-CM: 567.22  07/29/2017 Unknown        Hx of gastric ulcer ICD-10-CM: Z87.19  ICD-9-CM: V12.79  07/29/2017 Unknown        Hypertension complicating diabetes (HCC) ICD-10-CM: E11.59, I10  ICD-9-CM: 250.80, 401.9  05/25/2016 Yes        Diabetes (HCC) ICD-10-CM: E11.9  ICD-9-CM: 250.00  01/24/2013 Yes              Plan/Recommendations/Medical Decision Making:     - Diet as tolerated  - Switch to oral pain medication with IV for breakthrough pain  - DC home today  - Repeat CT scan in 2 weeks to evaluate for resolution    Signed By: Kandis CockingSihong Tavarius Grewe, MD     August 11, 2017

## 2017-08-11 NOTE — Wound Image (Signed)
Taught her home pouch changes today and supplied her with a months-worth of supplies of Activelife and Marathon.   She is comfortable with this process and eager to go home.   New slim Penrose drain sutured in place within 1 x 1.5 cm fistula site.   Rodman PickleJulie Lakela [name], CWOCN

## 2017-08-11 NOTE — Progress Notes (Signed)
Spiritual Care Assessment/Progress Note  [facility]      NAME: Neva SeatRebecca D Boeding      MRN: [id number]  AGE: 61 y.o. SEX: female  Religious Affiliation: Methodist   Language: English     08/11/2017     Total Time (in minutes): 5     [facility] through conversation with:         [x] Patient        []  Family    []  Friend(s)        Reason for Consult: Initial/Spiritual assessment, patient floor     Spiritual beliefs: (Please include comment if needed)     [x]  Identifies with a faith tradition:         []  Supported by a faith community:            []  Claims no spiritual orientation:           []  Seeking spiritual identity:                []  Adheres to an individual form of spirituality:           []  Not able to assess:                           Identified resources for coping:      []  Prayer                               []  Music                  []  Guided Imagery     [x]  Family/friends                 []  Pet visits     []  Devotional reading                         []  Unknown     []  Other:                                              Interventions offered during this visit: (See comments for more details)    Patient Interventions: Affirmation of emotions/emotional suffering, Affirmation of faith, Normalization of emotional/spiritual concerns, Prayer (assurance of)           Plan of Care:     []  Support spiritual and/or cultural needs    []  Support AMD and/or advance care planning process      []  Support grieving process   []  Coordinate Rites and/or Rituals    []  Coordination with community clergy   []  No spiritual needs identified at this time   []  Detailed Plan of Care below (See Comments)  []  Make referral to Music Therapy  []  Make referral to Pet Therapy     []  Make referral to Addiction services  []  Make referral to [facility]  []  Make referral to Spiritual Care Partner  []  No future visits requested        [x]  Follow up visits as needed      Comments: Chaplain for initial visit. Pt was lying in bed and looking forward to being discharged soon. Pt shared her appreciation of the Spiritual Care  Volunteers. Let pt know of chaplain availability. Chaplain follow up as needed.     MedtronicChaplain Whitney Caswell, M.Div, MACE   [phone]

## 2017-08-11 NOTE — Discharge Summary (Signed)
Physician Discharge Summary     Patient ID:  Dana [name]  [id number]  61 y.o.  May 14, 1956    Allergies: Adhesive and Dolobid [diflunisal]    Admit Date: 07/29/2017    Discharge Date:08/11/2017    * Admission Diagnoses: Peritoneal Abcess;Intra-abdominal abscess (HCC);Intra-abdominal abscess (HCC)    * Discharge Diagnoses:    Hospital Problems as of 08/11/2017 Date Reviewed: 08/09/2017          Codes Class Noted - Resolved POA    * (Principal) Intra-abdominal abscess (HCC) ICD-10-CM: K65.1  ICD-9-CM: 567.22  07/29/2017 - Present Unknown        Hx of gastric ulcer ICD-10-CM: Z87.19  ICD-9-CM: V12.79  07/29/2017 - Present Unknown        Hypertension complicating diabetes (HCC) ICD-10-CM: E11.59, I10  ICD-9-CM: 250.80, 401.9  05/25/2016 - Present Yes        Diabetes (HCC) ICD-10-CM: E11.9  ICD-9-CM: 250.00  01/24/2013 - Present Yes               Admission Condition: Fair    * Discharge Condition: improved    * Procedures: Procedure(s) with comments:  ABDOMINAL DRAIN REPLACEMENT POSSIBLE EXPLORATORY LAPAROTOMY (WANTS MINOR TRAY)  REQ 1200 NOON - Superficial [facility]      * Hospital Course:   The patient presented to the office complaining of drainage around her PIgtail catheter from a previous intra-abdominal abscess- Ct showed that there was an increased collection. She was sent to IR who attempted to upsize the drain but ended up  entering the colon. She did not get septic from this. She was kept on abx,.NPO. She did ok from this. She was started slowly back on a diet. Repeat UGI did not show a leak. GI was consulted for a possible endo clipping. She had an EGD which showed that the ulcer had healed . She continued ot have pain at her drain site and so she was taken to the OR and had the pigtail changed to a penrose drain. She tolerated this well.Marland Kitchen. She was placed back on a diet and discharged home. During her stay she was followed by Dr. Cherly HensenPahle , her PCP and her team for help in  managing her chronic medical problems.     Consults: Gastroenterology and Internal Medicine    Significant Diagnostic Studies: radiology: Ct showing larger collection  UGI that showed no fistula    * Disposition: Home    Discharge Medications:   Discharge Medication List as of 08/11/2017  2:52 PM      START taking these medications    Details   amoxicillin-clavulanate (AUGMENTIN) 875-125 mg per tablet Take 1 Tab by mouth every twelve (12) hours for 10 days., Print, Disp-20 Tab, R-0      docusate sodium (COLACE) 100 mg capsule Take 1 Cap by mouth two (2) times daily as needed for Constipation., Print, Disp-30 Cap, R-0         CONTINUE these medications which have CHANGED    Details   HYDROmorphone (DILAUDID) 2 mg tablet Take 1 Tab by mouth every four (4) hours as needed for Pain. Max Daily Amount: 12 mg., Print, Disp-40 Tab, R-0      !! trimethoprim-sulfamethoxazole (BACTRIM DS, SEPTRA DS) 160-800 mg per tablet Take 1 Tab by mouth two (2) times a day for 10 days., Normal, Disp-20 Tab, R-0      !! trimethoprim-sulfamethoxazole (BACTRIM DS, SEPTRA DS) 160-800 mg per tablet Take 1 Tab by  mouth two (2) times a day for 10 days., Normal, Disp-20 Tab, R-0      !! trimethoprim-sulfamethoxazole (BACTRIM DS, SEPTRA DS) 160-800 mg per tablet Take 1 Tab by mouth two (2) times a day for 14 days., Print, Disp-28 Tab, R-0       !! - Potential duplicate medications found. Please discuss with provider.      CONTINUE these medications which have NOT CHANGED    Details   CARAFATE 100 mg/mL suspension TAKE 10 ML BY MOUTH FOUR (4) TIMES DAILY FOR 28 DAYS. INDICATIONS: GASTRIC ULCER, Normal, Disp-1120 mL, R-0      amLODIPine (NORVASC) 5 mg tablet Take 1 Tab by mouth daily., Normal, Disp-30 Tab, R-0      colchicine 0.6 mg tablet Take 1 Tab by mouth three (3) times daily., Normal, Disp-12 Tab, R-0      insulin NPH (HUMULIN N NPH U-100 INSULIN) 100 unit/mL injection 32 Units by SubCUTAneous route nightly., Historical Med      pantoprazole  (PROTONIX) 40 mg tablet Take 1 Tab by mouth two (2) times a day., Normal, Disp-30 Tab, R-1      metFORMIN ER (GLUCOPHAGE XR) 500 mg tablet TAKE 1 TABLET EVERY MORNING AND 2 TABLETS WITH DINNER, Normal, Disp-270 Tab, R-2      traMADol (ULTRAM) 50 mg tablet Take 1 Tab by mouth every eight (8) hours as needed for Pain. Max Daily Amount: 150 mg., Print, Disp-30 Tab, R-0      sertraline (ZOLOFT) 100 mg tablet TAKE 1 TABLET BY MOUTH DAILY, Normal, Disp-90 Tab, R-3      triamterene-hydroCHLOROthiazide (MAXZIDE) 37.5-25 mg per tablet TAKE 1 TABLET DAILY, Normal, Disp-90 Tab, R-2             * Follow-up Care/Patient Instructions:  Activity: See surgical instructions  Diet: Diabetic Diet  Wound Care: As directed    Follow-up Information     Follow up With Specialties Details Why Contact Info    Hoy FinlayShindel, Diannah Rindfleisch, MD General Surgery In 1 week  [address]  [address]  [place] [id number]  [phone]      Lake BellsPahle, Nancy J, MD Internal Medicine   [address]  [place] [address]  [phone]          Follow-up tests/labs     Signed:  Hoy FinlayStuart Micca Matura, MD  08/14/2017  11:34 PM

## 2017-08-16 MED ORDER — HYDROXYZINE 50 MG TAB
50 mg | ORAL_TABLET | ORAL | 2 refills | Status: DC
Start: 2017-08-16 — End: 2018-03-23

## 2017-08-16 NOTE — Telephone Encounter (Signed)
Barbara from the [employer] [place] called in refrence to this patients stay. She was partially denied for her stay (only approved for three days) & stayed in the hospital for almost a month. A peer to peer is being requested by her. If you can call when youre available.

## 2017-08-17 NOTE — Telephone Encounter (Signed)
Spoke with Britta MccreedyBarbara with [employer] Dept with authorization number [id number]. Regarding Peer to Peer. Hospitalization is not being paid for entire hospital stay. She has spoken with Dr.Shindel and Lesle ChrisSandy Owens regarding this issues.

## 2017-08-19 ENCOUNTER — Ambulatory Visit: Admit: 2017-08-19 | Payer: PRIVATE HEALTH INSURANCE | Attending: Internal Medicine | Primary: Internal Medicine

## 2017-08-19 DIAGNOSIS — M1A0720 Idiopathic chronic gout, left ankle and foot, without tophus (tophi): Secondary | ICD-10-CM

## 2017-08-19 LAB — AMB POC HEMOGLOBIN A1C: Hemoglobin A1c (POC): 7.4 % — AB (ref 4.8–5.6)

## 2017-08-19 LAB — AMB POC COMPLETE CBC,AUTOMATED ENTER
ABS. GRANS (POC): 4.8 10*3/uL (ref 1.4–6.5)
ABS. LYMPHS (POC): 2.4 10*3/uL (ref 1.2–3.4)
ABS. MONOS (POC): 0.5 10*3/uL (ref 0.1–0.6)
GRANULOCYTES (POC): 62.4 % (ref 42.2–75.2)
HCT (POC): 39.3 % (ref 35.0–60.0)
HGB (POC): 13 g/dL (ref 11–18)
LYMPHOCYTES (POC): 31.3 % (ref 20.5–51.1)
MCH (POC): 28.9 pg (ref 27.0–31.0)
MCHC (POC): 33.2 g/dL (ref 33.0–37.0)
MCV (POC): 87 fL (ref 80.0–99.9)
MONOCYTES (POC): 6.3 % (ref 1.7–9.3)
MPV (POC): 7.9 fL (ref 7.8–11)
PLATELET (POC): 325 10*3/uL (ref 150–450)
RBC (POC): 4.52 M/uL (ref 4.00–6.00)
RDW (POC): 17.1 % — AB (ref 11.6–13.7)
WBC (POC): 7.7 10*3/uL (ref 4.5–10.5)

## 2017-08-19 MED ORDER — COLCHICINE 0.6 MG TAB
0.6 mg | ORAL_TABLET | ORAL | 0 refills | Status: DC
Start: 2017-08-19 — End: 2017-08-25

## 2017-08-19 NOTE — Progress Notes (Signed)
Dana [name] is a 61 y.o. female and presents with   Chief Complaint   Patient presents with   ??? Other     F/U hospital   .  Patient doing well-  Drain is not putting much out.  She is not checking blood sugars.  She is feeling pretty washed out.  Needs a refill on colchicine,  Foot is better.  Just has one small sore spot.      Current Outpatient Medications   Medication Sig Dispense Refill   ??? colchicine 0.6 mg tablet TAKE 1 TABLET BY MOUTH TWICE A DAY 12 Tab 0   ??? hydrOXYzine HCl (ATARAX) 50 mg tablet TAKE 1 TABLET BY MOUTH EVERY SIX (6) HOURS AS NEEDED FOR ITCHING FOR UP TO 10 DAYS. 100 Tab 2   ??? HYDROmorphone (DILAUDID) 2 mg tablet Take 1 Tab by mouth every four (4) hours as needed for Pain. Max Daily Amount: 12 mg. 40 Tab 0   ??? amoxicillin-clavulanate (AUGMENTIN) 875-125 mg per tablet Take 1 Tab by mouth every twelve (12) hours for 10 days. 20 Tab 0   ??? docusate sodium (COLACE) 100 mg capsule Take 1 Cap by mouth two (2) times daily as needed for Constipation. 30 Cap 0   ??? trimethoprim-sulfamethoxazole (BACTRIM DS, SEPTRA DS) 160-800 mg per tablet Take 1 Tab by mouth two (2) times a day for 14 days. 28 Tab 0   ??? CARAFATE 100 mg/mL suspension TAKE 10 ML BY MOUTH FOUR (4) TIMES DAILY FOR 28 DAYS. INDICATIONS: GASTRIC ULCER 1120 mL 0   ??? amLODIPine (NORVASC) 5 mg tablet Take 1 Tab by mouth daily. 30 Tab 0   ??? insulin NPH (HUMULIN N NPH U-100 INSULIN) 100 unit/mL injection 32 Units by SubCUTAneous route nightly.     ??? pantoprazole (PROTONIX) 40 mg tablet Take 1 Tab by mouth two (2) times a day. 30 Tab 1   ??? metFORMIN ER (GLUCOPHAGE XR) 500 mg tablet TAKE 1 TABLET EVERY MORNING AND 2 TABLETS WITH DINNER 270 Tab 2   ??? traMADol (ULTRAM) 50 mg tablet Take 1 Tab by mouth every eight (8) hours as needed for Pain. Max Daily Amount: 150 mg. 30 Tab 0   ??? sertraline (ZOLOFT) 100 mg tablet TAKE 1 TABLET BY MOUTH DAILY 90 Tab 3   ??? triamterene-hydroCHLOROthiazide (MAXZIDE) 37.5-25 mg per tablet TAKE 1 TABLET DAILY 90 Tab 2      Allergies   Allergen Reactions   ??? Adhesive Itching   ??? Dolobid [Diflunisal] Itching     Itching,swelling     Past Medical History:   Diagnosis Date   ??? Atherosclerosis of abdominal aorta (HCC)    ??? Diabetes (HCC) 01/24/2013   ??? GERD (gastroesophageal reflux disease)    ??? Gout    ??? Hives    ??? HTN (hypertension)    ??? Hypercholesterolemia    ??? Perforated gastric ulcer (HCC)    ??? Postprocedural intraabdominal abscess      Past Surgical History:   Procedure Laterality Date   ??? ABDOMEN SURGERY PROC UNLISTED  05/12/2017    Lap exploratory perforated posterior ulcer repair by Dr. Amada JupiterShindel    ??? HX HYSTERECTOMY       Family History   Problem Relation Age of Onset   ??? Hypertension Mother    ??? Cancer Father         mesothelioma   ??? Diabetes Father    ??? Hypertension Father      Social History  Tobacco Use   ??? Smoking status: Former Smoker     Packs/day: 1.00     Last attempt to quit: 06/27/2011     Years since quitting: 6.1   ??? Smokeless tobacco: Never Used   Substance Use Topics   ??? Alcohol use: Yes     Alcohol/week: 0.6 - 1.2 oz     Types: 1 - 2 Glasses of wine per week     Comment: Per month       The patient does not have a history of falls. A plan of care for falls was documented..  Depression screen positive- treated      Objective:  Visit Vitals  BP 138/88   Ht 5' 3.5" (1.613 m)   Wt 223 lb (101.2 kg)   BMI 38.88 kg/m??     wdwn  61 yo wf  In NAD. A&O.  HEENT -- Pupils round.  O/P Clear.  Neck -- Supple. No JVD.  Heart -- RRR. No R/M/G.  Lungs -- CTA.  Abdomen -- Soft. Non-tender  Drain intact.. Non-distended. No masses. Bowel sounds present.  Extremities -- No edema. +1 pulses      Results for orders placed or performed in visit on 08/19/17   AMB POC COMPLETE CBC,AUTOMATED ENTER   Result Value Ref Range    WBC (POC) 7.7 4.5 - 10.5 K/uL    LYMPHOCYTES (POC) 31.3 20.5 - 51.1 %    MONOCYTES (POC) 6.3 1.7 - 9.3 %    GRANULOCYTES (POC) 62.4 42.2 - 75.2 %    ABS. LYMPHS (POC) 2.4 1.2 - 3.4 K/uL     ABS. MONOS (POC) 0.5 0.1 - 0.6 10^3/ul    ABS. GRANS (POC) 4.8 1.4 - 6.5 10^3/ul    RBC (POC) 4.52 4.00 - 6.00 M/uL    HGB (POC) 13.0 11 - 18 g/dL    HCT (POC) 16.139.3 09.635.0 - 60.0 %    MCV (POC) 87.0 80.0 - 99.9 fL    MCH (POC) 28.9 27.0 - 31.0 pg    MCHC (POC) 33.2 33.0 - 37.0 g/dL    RDW (POC) 04.517.1 (A) 40.911.6 - 13.7 %    PLATELET (POC) 325 150 - 450 K/uL    MPV (POC) 7.9 7.8 - 11 fL   AMB POC HEMOGLOBIN A1C   Result Value Ref Range    Hemoglobin A1c (POC) 7.4 (A) 4.8 - 5.6 %       Assessment/Plan:    ICD-10-CM ICD-9-CM    1. Chronic gout of left foot, unspecified cause M1A.0720 274.02 colchicine 0.6 mg tablet   2. Hypertension complicating diabetes (HCC) E11.59 250.80     I10 401.9    3. Type 2 diabetes mellitus with hyperglycemia, with long-term current use of insulin (HCC) E11.65 250.00 METABOLIC PANEL, COMPREHENSIVE    Z79.4 790.29 AMB POC HEMOGLOBIN A1C     V58.67    4. Obstructive sleep apnea syndrome G47.33 327.23    5. Morbid obesity due to excess calories (HCC) E66.01 278.01 COLLECTION VENOUS BLOOD,VENIPUNCTURE   6. Peritoneal abscess (HCC) K65.1 567.22 AMB POC COMPLETE CBC,AUTOMATED ENTER   7. Intra-abdominal abscess (HCC) K65.1 567.22      Follow up one month.  Note to RTW  Jan 2nd      Author:  Lake BellsNancy J Welles Walthall, MD 08/19/2017 2:57 PM

## 2017-08-19 NOTE — Progress Notes (Signed)
pls call= kidney and liver function fine.

## 2017-08-19 NOTE — Patient Instructions (Signed)
Results for orders placed or performed in visit on 08/19/17   AMB POC COMPLETE CBC,AUTOMATED ENTER   Result Value Ref Range    WBC (POC) 7.7 4.5 - 10.5 K/uL    LYMPHOCYTES (POC) 31.3 20.5 - 51.1 %    MONOCYTES (POC) 6.3 1.7 - 9.3 %    GRANULOCYTES (POC) 62.4 42.2 - 75.2 %    ABS. LYMPHS (POC) 2.4 1.2 - 3.4 K/uL    ABS. MONOS (POC) 0.5 0.1 - 0.6 10^3/ul    ABS. GRANS (POC) 4.8 1.4 - 6.5 10^3/ul    RBC (POC) 4.52 4.00 - 6.00 M/uL    HGB (POC) 13.0 11 - 18 g/dL    HCT (POC) 16.139.3 09.635.0 - 60.0 %    MCV (POC) 87.0 80.0 - 99.9 fL    MCH (POC) 28.9 27.0 - 31.0 pg    MCHC (POC) 33.2 33.0 - 37.0 g/dL    RDW (POC) 04.517.1 (A) 40.911.6 - 13.7 %    PLATELET (POC) 325 150 - 450 K/uL    MPV (POC) 7.9 7.8 - 11 fL     Restart simvastatin

## 2017-08-19 NOTE — Progress Notes (Signed)
Called patient

## 2017-08-20 LAB — METABOLIC PANEL, COMPREHENSIVE
A-G Ratio: 1.4 (ref 1.2–2.2)
ALT (SGPT): 30 IU/L (ref 0–32)
AST (SGOT): 26 IU/L (ref 0–40)
Albumin: 4 g/dL (ref 3.5–5.5)
Alk. phosphatase: 49 IU/L (ref 39–117)
BUN/Creatinine ratio: 15 (ref 9–23)
BUN: 13 mg/dL (ref 6–24)
Bilirubin, total: 0.3 mg/dL (ref 0.0–1.2)
CO2: 22 mmol/L (ref 20–29)
Calcium: 9.5 mg/dL (ref 8.7–10.2)
Chloride: 103 mmol/L (ref 96–106)
Creatinine: 0.88 mg/dL (ref 0.57–1.00)
GFR est AA: 83 mL/min/{1.73_m2} (ref >59)
GFR est non-AA: 72 mL/min/{1.73_m2} (ref >59)
GLOBULIN, TOTAL: 2.8 g/dL (ref 1.5–4.5)
Glucose: 98 mg/dL (ref 65–99)
Potassium: 4 mmol/L (ref 3.5–5.2)
Protein, total: 6.8 g/dL (ref 6.0–8.5)
Sodium: 140 mmol/L (ref 134–144)

## 2017-08-23 ENCOUNTER — Ambulatory Visit
Admit: 2017-08-23 | Discharge: 2017-08-23 | Payer: PRIVATE HEALTH INSURANCE | Attending: Surgery | Primary: Internal Medicine

## 2017-08-23 DIAGNOSIS — K651 Peritoneal abscess: Secondary | ICD-10-CM

## 2017-08-23 NOTE — Progress Notes (Signed)
1. Have you been to the ER, urgent care clinic since your last visit?  Hospitalized since your last visit?No    2. Have you seen or consulted any other health care providers outside of the [facility] since your last visit?  Include any pap smears or colon screening. No

## 2017-08-23 NOTE — Progress Notes (Signed)
Reason for Visit:  Follow-up abdominal wall abscess    Brief History:  61-year-old woman who had a complicated course of perforated gastric ulcer.  The patient had a gastric ulcer repair done by Dr. Shindel in 05/2017.  She had a complicated course secondary to fistulization and abscess of the perforation site.  She was taken to OR for abdominal wall washout and placement of penrose drain.  Patient is doing better and pain is better control.  No nausea or vomiting.  No fever or chills.  Pt does still have some drainage from penrose wound.      Visit Vitals  BP 138/88 (BP 1 Location: Right arm, BP Patient Position: Sitting)   Pulse 79   Temp 97.6 ??F (36.4 ??C) (Oral)   Resp 18   Ht 5' 3.5" (1.613 m)   Wt 223 lb (101.2 kg)   SpO2 98%   BMI 38.88 kg/m??         Physical Exam:    Gen:  NAD  Pulm:  Unlabored  Abd:  S/ND/appropriate TTP  Wound:  Abdominal wound still with seropurulent drainage    AP: 61 yo woman with gastric perforation s/p repair and subsequent drainage of abscess    - No acute surgical issues  - Gastric fistula:  There is still residue abscess so keep penrose drain for now  - Continue antibiotic therapy  - Follow-up in two weeks for penrose drain removal.

## 2017-08-23 NOTE — Progress Notes (Signed)
Reason for Visit:  Follow-up abdominal wall abscess    Brief History:  61 year old woman who had a complicated course of perforated gastric ulcer.  The patient had a gastric ulcer repair done by Dr. Amada JupiterShindel in 05/2017.  She had a complicated course secondary to fistulization and abscess of the perforation site.  She was taken to OR for abdominal wall washout and placement of penrose drain.  Patient is doing better and pain is better control.  No nausea or vomiting.  No fever or chills.  Pt does still have some drainage from penrose wound.      Visit Vitals  BP 138/88 (BP 1 Location: Right arm, BP Patient Position: Sitting)   Pulse 79   Temp 97.6 ??F (36.4 ??C) (Oral)   Resp 18   Ht 5' 3.5" (1.613 m)   Wt 223 lb (101.2 kg)   SpO2 98%   BMI 38.88 kg/m??         Physical Exam:    Gen:  NAD  Pulm:  Unlabored  Abd:  S/ND/appropriate TTP  Wound:  Abdominal wound still with seropurulent drainage    AP: 61 yo woman with gastric perforation s/p repair and subsequent drainage of abscess    - No acute surgical issues  - Gastric fistula:  There is still residue abscess so keep penrose drain for now  - Continue antibiotic therapy  - Follow-up in two weeks for penrose drain removal.

## 2017-08-23 NOTE — Progress Notes (Signed)
1. Have you been to the ER, urgent care clinic since your last visit?  Hospitalized since your last visit?No    2. Have you seen or consulted any other health care providers outside of the [facility] since your last visit?  Include any pap smears or colon screening. No

## 2017-08-25 ENCOUNTER — Encounter

## 2017-08-26 MED ORDER — COLCHICINE 0.6 MG TAB
0.6 mg | ORAL_TABLET | ORAL | 0 refills | Status: DC
Start: 2017-08-26 — End: 2017-09-23

## 2017-08-31 ENCOUNTER — Ambulatory Visit: Payer: BLUE CROSS/BLUE SHIELD | Primary: Internal Medicine

## 2017-09-01 MED ORDER — AMLODIPINE 5 MG TAB
5 mg | ORAL_TABLET | ORAL | 0 refills | Status: DC
Start: 2017-09-01 — End: 2017-10-02

## 2017-09-01 MED ORDER — FLUCONAZOLE 150 MG TAB
150 mg | ORAL_TABLET | Freq: Every day | ORAL | 1 refills | Status: AC
Start: 2017-09-01 — End: 2017-09-02

## 2017-09-01 NOTE — Telephone Encounter (Signed)
Melissa from [facility] is reporting that there is a interaction between medications Amlodipine 5 mg and simvastatin 40 mg and needs to know what Dr. Cherly HensenPahle wants them to do.    Melissa ph# [phone]

## 2017-09-01 NOTE — Telephone Encounter (Signed)
Continue the meds. njp

## 2017-09-02 NOTE — Progress Notes (Signed)
Home health orders reviewed and signed.

## 2017-09-09 ENCOUNTER — Inpatient Hospital Stay: Admit: 2017-09-09 | Payer: BLUE CROSS/BLUE SHIELD | Attending: Surgery | Primary: Internal Medicine

## 2017-09-09 ENCOUNTER — Ambulatory Visit

## 2017-09-09 DIAGNOSIS — K651 Peritoneal abscess: Secondary | ICD-10-CM

## 2017-09-09 MED ORDER — SODIUM CHLORIDE 0.9% BOLUS IV
0.9 % | Freq: Once | INTRAVENOUS | Status: AC
Start: 2017-09-09 — End: 2017-09-09
  Administered 2017-09-09: 17:00:00 via INTRAVENOUS

## 2017-09-09 MED ORDER — SODIUM CHLORIDE 0.9 % IJ SYRG
Freq: Once | INTRAMUSCULAR | Status: AC
Start: 2017-09-09 — End: 2017-09-09
  Administered 2017-09-09: 17:00:00 via INTRAVENOUS

## 2017-09-09 MED ORDER — IOPAMIDOL 76 % IV SOLN
370 mg iodine /mL (76 %) | Freq: Once | INTRAVENOUS | Status: AC
Start: 2017-09-09 — End: 2017-09-09
  Administered 2017-09-09: 17:00:00 via INTRAVENOUS

## 2017-09-09 MED ORDER — IOHEXOL 240 MG/ML IV SOLN
240 mg iodine/mL | Freq: Once | INTRAVENOUS | Status: AC
Start: 2017-09-09 — End: 2017-09-09
  Administered 2017-09-09: 17:00:00 via ORAL

## 2017-09-09 MED FILL — SODIUM CHLORIDE 0.9 % IV: INTRAVENOUS | Qty: 100

## 2017-09-09 MED FILL — ISOVUE-370  76 % INTRAVENOUS SOLUTION: 370 mg iodine /mL (76 %) | INTRAVENOUS | Qty: 100

## 2017-09-09 MED FILL — OMNIPAQUE 240 MG IODINE/ML INTRAVENOUS SOLUTION: 240 mg iodine/mL | INTRAVENOUS | Qty: 50

## 2017-09-09 MED FILL — NORMAL SALINE FLUSH 0.9 % INJECTION SYRINGE: INTRAMUSCULAR | Qty: 10

## 2017-09-13 ENCOUNTER — Ambulatory Visit
Admit: 2017-09-13 | Discharge: 2017-09-13 | Payer: PRIVATE HEALTH INSURANCE | Attending: Surgery | Primary: Internal Medicine

## 2017-09-13 DIAGNOSIS — L02211 Cutaneous abscess of abdominal wall: Secondary | ICD-10-CM

## 2017-09-13 NOTE — Progress Notes (Signed)
Reason for Visit:  Follow-up gastric fistula and abdominal wall abscess    Brief History: 61-year-old woman who had a complicated course of perforated gastric ulcer.  The patient had a gastric ulcer repair done by Dr. Shindel in 05/2017.  She had a complicated course secondary to fistulization and abscess of the perforation site.  She was admitted to the hospital for IV antibiotic and also for management of the fistula.  She was taken to OR for drainage of abdominal wall abscess and placement of penrose drain.  Pt was still having some purulent fluid so penrose drain was kept in lace and CT scan as ordered.  CT scan showed resolution of abscess.  Pt reported abdominal pain is much improved. No nausea or vomiting.  No fever or chills.    Visit Vitals  BP 130/86 (BP 1 Location: Left arm, BP Patient Position: Sitting)   Pulse 86   Temp 97.8 ??F (36.6 ??C) (Oral)   Resp 18   Ht 5' 3.5" (1.613 m)   Wt 222 lb (100.7 kg)   SpO2 97%   BMI 38.71 kg/m??         Physical Exam:    Gen:  NAD  Pulm:  Unlabored  Abd:  S/ND/appropriate TTP  Wound:  Abdominal wound without drainage.  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

## 2017-09-13 NOTE — Progress Notes (Signed)
1. Have you been to the ER, urgent care clinic since your last visit?  Hospitalized since your last visit?No    2. Have you seen or consulted any other health care providers outside of the [facility] since your last visit?  Include any pap smears or colon screening. No

## 2017-09-13 NOTE — Progress Notes (Signed)
1. Have you been to the ER, urgent care clinic since your last visit?  Hospitalized since your last visit?No    2. Have you seen or consulted any other health care providers outside of the [facility] since your last visit?  Include any pap smears or colon screening. No

## 2017-09-13 NOTE — Progress Notes (Signed)
Reason for Visit:  Follow-up gastric fistula and abdominal wall abscess    Brief History: 62 year old woman who had a complicated course of perforated gastric ulcer.  The patient had a gastric ulcer repair done by Dr. Amada JupiterShindel in 05/2017.  She had a complicated course secondary to fistulization and abscess of the perforation site.  She was admitted to the hospital for IV antibiotic and also for management of the fistula.  She was taken to OR for drainage of abdominal wall abscess and placement of penrose drain.  Pt was still having some purulent fluid so penrose drain was kept in lace and CT scan as ordered.  CT scan showed resolution of abscess.  Pt reported abdominal pain is much improved. No nausea or vomiting.  No fever or chills.    Visit Vitals  BP 130/86 (BP 1 Location: Left arm, BP Patient Position: Sitting)   Pulse 86   Temp 97.8 ??F (36.6 ??C) (Oral)   Resp 18   Ht 5' 3.5" (1.613 m)   Wt 222 lb (100.7 kg)   SpO2 97%   BMI 38.71 kg/m??         Physical Exam:    Gen:  NAD  Pulm:  Unlabored  Abd:  S/ND/appropriate TTP  Wound:  Abdominal wound without drainage.  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

## 2017-09-23 ENCOUNTER — Encounter

## 2017-09-23 MED ORDER — COLCHICINE 0.6 MG TAB
0.6 mg | ORAL_TABLET | Freq: Two times a day (BID) | ORAL | 3 refills | Status: DC
Start: 2017-09-23 — End: 2018-12-14

## 2017-10-03 MED ORDER — TRIAMTERENE-HYDROCHLOROTHIAZIDE 37.5 MG-25 MG TAB
ORAL_TABLET | ORAL | 2 refills | Status: DC
Start: 2017-10-03 — End: 2018-06-29

## 2017-10-03 MED ORDER — AMLODIPINE 5 MG TAB
5 mg | ORAL_TABLET | ORAL | 0 refills | Status: DC
Start: 2017-10-03 — End: 2017-11-22

## 2017-10-03 MED ORDER — PANTOPRAZOLE 40 MG TAB, DELAYED RELEASE
40 mg | ORAL_TABLET | Freq: Two times a day (BID) | ORAL | 1 refills | Status: DC
Start: 2017-10-03 — End: 2017-12-04

## 2017-10-04 NOTE — Telephone Encounter (Signed)
Pantoprazole (PROTONIX)  40 mg take 1 tablet by mouth 2 times a day #60 with 1 refill required prior auth. I called [facility] [phone] and received   Prior auth #[id number] good thru 10/04/17-10/04/18.   I called [facility] [phone] and they ran it thru and it was approved.  I emailed patient thru mychart and let her know Dr. Lenn CalSuy had refilled her prescription and to call the pharmacy before going to pick it up just to make sure it is ready.

## 2017-11-09 MED ORDER — SIMVASTATIN 40 MG TAB
40 mg | ORAL_TABLET | ORAL | 1 refills | Status: DC
Start: 2017-11-09 — End: 2018-04-20

## 2017-11-22 MED ORDER — AMLODIPINE 5 MG TAB
5 mg | ORAL_TABLET | ORAL | 1 refills | Status: DC
Start: 2017-11-22 — End: 2018-03-06

## 2017-12-06 MED ORDER — PANTOPRAZOLE 40 MG TAB, DELAYED RELEASE
40 mg | ORAL_TABLET | ORAL | 1 refills | Status: DC
Start: 2017-12-06 — End: 2018-01-02

## 2017-12-31 ENCOUNTER — Encounter

## 2017-12-31 MED ORDER — INSULIN NPH HUMAN RECOMB 100 UNIT/ML (3 ML) SUB-Q PEN
100 unit/mL (3 mL) | SUBCUTANEOUS | 2 refills | Status: DC
Start: 2017-12-31 — End: 2018-03-29

## 2018-01-02 MED ORDER — PANTOPRAZOLE 40 MG TAB, DELAYED RELEASE
40 mg | ORAL_TABLET | ORAL | 0 refills | Status: DC
Start: 2018-01-02 — End: 2018-02-14

## 2018-01-02 NOTE — Telephone Encounter (Signed)
This is a courtesy last refill offered to patient.  As this is ongoing medication, she will need to see her PCP for long term refill.

## 2018-01-16 MED ORDER — SERTRALINE 100 MG TAB
100 mg | ORAL_TABLET | ORAL | 2 refills | Status: DC
Start: 2018-01-16 — End: 2018-10-04

## 2018-02-14 MED ORDER — PANTOPRAZOLE 40 MG TAB, DELAYED RELEASE
40 mg | ORAL_TABLET | ORAL | 3 refills | Status: DC
Start: 2018-02-14 — End: 2018-05-13

## 2018-02-26 MED ORDER — METFORMIN SR 500 MG 24 HR TABLET
500 mg | ORAL_TABLET | ORAL | 2 refills | Status: DC
Start: 2018-02-26 — End: 2018-11-23

## 2018-03-06 MED ORDER — AMLODIPINE 5 MG TAB
5 mg | ORAL_TABLET | ORAL | 1 refills | Status: DC
Start: 2018-03-06 — End: 2018-05-23

## 2018-03-23 ENCOUNTER — Ambulatory Visit: Admit: 2018-03-23 | Attending: Internal Medicine | Primary: Internal Medicine

## 2018-03-23 ENCOUNTER — Ambulatory Visit: Attending: Internal Medicine | Primary: Internal Medicine

## 2018-03-23 DIAGNOSIS — Z0000 Encounter for general adult medical examination without abnormal findings: Secondary | ICD-10-CM

## 2018-03-23 LAB — AMB POC URINALYSIS DIP STICK MANUAL W/O MICRO
Bilirubin (UA POC): NEGATIVE
Bilirubin, Urine, POC: NEGATIVE
Blood (UA POC): NEGATIVE
Blood (UA POC): NEGATIVE
Glucose (UA POC): NEGATIVE
Glucose, Urine, POC: NEGATIVE
Ketones (UA POC): NEGATIVE
Ketones, Urine, POC: NEGATIVE
Leukocyte Esterase, Urine, POC: NEGATIVE
Leukocyte esterase (UA POC): NEGATIVE
Nitrite, Urine, POC: NEGATIVE
Nitrites (UA POC): NEGATIVE
Protein (UA POC): NEGATIVE
Protein, Urine, POC: NEGATIVE
Specific Gravity, Urine, POC: 1.015 NA (ref 1.001–1.035)
Specific gravity (UA POC): 1.015 (ref 1.001–1.035)
Urobilinogen (UA POC): 0.2 (ref 0.2–1)
Urobilinogen, POC: 0.2 (ref 0.2–1)
pH (UA POC): 5.5 (ref 4.6–8.0)
pH, Urine, POC: 5.5 NA (ref 4.6–8.0)

## 2018-03-23 LAB — AMB POC COMPLETE CBC,AUTOMATED ENTER
ABS. GRANS (POC): 4.7 10*3/uL (ref 1.4–6.5)
ABS. LYMPHS (POC): 1.9 10*3/uL (ref 1.2–3.4)
ABS. MONOS (POC): 0.3 10*3/uL (ref 0.1–0.6)
GRANULOCYTES (POC): 68.4 % (ref 42.2–75.2)
Granulocytes %, POC: 68.4 % (ref 42.2–75.2)
Granulocytes Abs: 4.7 10*3/uL (ref 1.4–6.5)
HCT (POC): 41.2 % (ref 35.0–60.0)
HGB (POC): 13.8 g/dL (ref 11–18)
Hematocrit, POC: 41.2 % (ref 35.0–60.0)
Hemoglobin, POC: 13.8 g/dL (ref 11–18)
LYMPHOCYTES (POC): 27.6 % (ref 20.5–51.1)
Lymphocyte %: 27.6 % (ref 20.5–51.1)
Lymphs Abs: 1.9 10*3/uL (ref 1.2–3.4)
MCH (POC): 29.6 pg (ref 27.0–31.0)
MCH: 29.6 pg (ref 27.0–31.0)
MCHC (POC): 33.6 g/dL (ref 33.0–37.0)
MCHC: 33.6 g/dL (ref 33.0–37.0)
MCV (POC): 88 fL (ref 80.0–99.9)
MCV: 88 fL (ref 80.0–99.9)
MONOCYTES (POC): 4 % (ref 1.7–9.3)
MPV (POC): 7.9 fL (ref 7.8–11)
MPV POC: 7.9 fL (ref 7.8–11)
Monocyte %: 4 % (ref 1.7–9.3)
Monocyte Absolute, POC: 0.3 10*3/uL (ref 0.1–0.6)
PLATELET (POC): 251 10*3/uL (ref 150–450)
Platelet Count, POC: 251 10*3/uL (ref 150–450)
RBC (POC): 4.68 M/uL (ref 4.00–6.00)
RBC, POC: 4.68 M/uL (ref 4.00–6.00)
RDW (POC): 14.6 % — AB (ref 11.6–13.7)
RDW, POC: 14.6 % — AB (ref 11.6–13.7)
WBC (POC): 6.9 10*3/uL (ref 4.5–10.5)
WBC, POC: 6.9 10*3/uL (ref 4.5–10.5)

## 2018-03-23 LAB — AMB POC LIPID PROFILE
Cholesterol (POC): 153 mg/dL (ref 100–199)
Cholesterol, POC: 153 mg/dL (ref 100–199)
HDL Cholesterol (POC): 34 mg/dL — AB (ref 35–150)
HDL Cholesterol, POC: 34 mg/dL — AB (ref 35–150)
LDL Cholesterol (POC): 97 mg/dL (ref 0–129)
LDL Cholesterol, POC: 97 mg/dL (ref 0–129)
Non-HDL Goal (POC): 119
Non-HDL Goal, POC: 119 NA
TChol/HDL Ratio (POC): 4.5 CALC (ref 0.0–5.0)
TChol/HDL Ratio (POC): 4.5 CALC (ref 0.0–5.0)
Triglycerides (POC): 106 mg/dL (ref 0–150)
Triglycerides, POC: 106 mg/dL (ref 0–150)

## 2018-03-23 LAB — AMB POC HEMOGLOBIN A1C
Hemoglobin A1C, POC: 6.4 % — AB (ref 4.8–5.6)
Hemoglobin A1c (POC): 6.4 % — AB (ref 4.8–5.6)

## 2018-03-23 NOTE — Progress Notes (Signed)
History and Physical    Dana [name] is a 62 y.o. female presents for physical exam.  Feeling better.      Past Medical History:   Diagnosis Date   ??? Atherosclerosis of abdominal aorta (HCC)    ??? Diabetes (HCC) 01/24/2013   ??? GERD (gastroesophageal reflux disease)    ??? Gout    ??? Hives    ??? HTN (hypertension)    ??? Hypercholesterolemia    ??? Perforated gastric ulcer (HCC)    ??? Postprocedural intraabdominal abscess      Past Surgical History:   Procedure Laterality Date   ??? ABDOMEN SURGERY PROC UNLISTED  05/12/2017    Lap exploratory perforated posterior ulcer repair by Dr. Amada Jupiter    ??? HX HYSTERECTOMY     ??? HX OTHER SURGICAL  05/13/2017    PROCEDURES PERFORMED:  Exploratory laparotomy, repair of perforated posterior ulcer with oversew and onlay omental patch-[facility]-DR. S. Shindel   ??? HX OTHER SURGICAL  08/09/2017     Abdominal drain replacement with irrigation of abdominal fistula tract-[facility]-Dr. Hulen Luster     Current Outpatient Medications   Medication Sig Dispense Refill   ??? amLODIPine (NORVASC) 5 mg tablet TAKE 1 TABLET BY MOUTH EVERY DAY 30 Tab 1   ??? metFORMIN ER (GLUCOPHAGE XR) 500 mg tablet TAKE 1 TABLET EVERY MORNING AND 2 TABLETS WITH DINNER 270 Tab 2   ??? pantoprazole (PROTONIX) 40 mg tablet TAKE 1 TABLET BY MOUTH TWICE A DAY 60 Tab 3   ??? sertraline (ZOLOFT) 100 mg tablet TAKE 1 TABLET BY MOUTH DAILY 90 Tab 2   ??? insulin NPH (HUMULIN N NPH INSULIN KWIKPEN) 100 unit/mL (3 mL) inpn INJECT 32 UNITS SUBCUTANEOUSLY AT BEDTIME 5 Adjustable Dose Pre-filled Pen Syringe 2   ??? simvastatin (ZOCOR) 40 mg tablet TAKE 1 TABLET NIGHTLY 90 Tab 1   ??? triamterene-hydroCHLOROthiazide (MAXZIDE) 37.5-25 mg per tablet TAKE 1 TABLET DAILY 90 Tab 2   ??? colchicine 0.6 mg tablet Take 1 Tab by mouth two (2) times a day. 20 Tab 3     Allergies   Allergen Reactions   ??? Adhesive Itching   ??? Dolobid [Diflunisal] Itching     Itching,swelling     Social History     Socioeconomic History   ??? Marital status: SINGLE     Spouse name: Not on file    ??? Number of children: Not on file   ??? Years of education: Not on file   ??? Highest education level: Not on file   Tobacco Use   ??? Smoking status: Former Smoker     Packs/day: 1.00     Last attempt to quit: 06/27/2011     Years since quitting: 6.7   ??? Smokeless tobacco: Never Used   Substance and Sexual Activity   ??? Alcohol use: Yes     Alcohol/week: 1.0 - 2.0 standard drinks     Types: 1 - 2 Glasses of wine per week     Comment: Per month    ??? Drug use: No   ??? Sexual activity: Yes     Family History   Problem Relation Age of Onset   ??? Hypertension Mother    ??? Cancer Father         mesothelioma   ??? Diabetes Father    ??? Hypertension Father      The patient does not have a history of falls. A plan of care for falls was documented..  Depression screen pos-  treated    Review of Systems:  Denies dysphagia, chest pain, or SOB.  No nausea or vomiting, or diarrhea or constipation.  No fever, chills, night sweats, or cough.  No dysuria, frequency or abdominal pain.  No headaches.  Mammogram- not done  Colon- 04/28/17  Follow up 10 yrs.    Bone density not done    Yearly eye exam:   Yes  Cataracts - last August  Yearly dental exam: yes    Objective  Visit Vitals  BP 130/76   Ht 5' 3.5" (1.613 m)   Wt 227 lb (103 kg)   BMI 39.58 kg/m??     General appearance - well developed, well nourished 62 y.o. wf nad  Eyes - PERRL  Tm- intact  Pharynx- no lesions  Neck --Supple, no anterior cervical nodes, no thyromegaly.+bruits  Lungs --CTA  Heart - Regular rate and rhythm  Abdomen - soft, no tenderness or distention  Breasts - no suspicious masses, no axillary nodes  Pelvic - {s/p hyst  Extremities - no clubbing, cyanosis or edema  Good pulses, no lesions    Results for orders placed or performed in visit on 03/23/18   AMB POC URINALYSIS DIP STICK MANUAL W/O MICRO   Result Value Ref Range    Color (UA POC) Yellow     Clarity (UA POC) Clear     Glucose (UA POC) Negative Negative    Bilirubin (UA POC) Negative Negative     Ketones (UA POC) Negative Negative    Specific gravity (UA POC) 1.015 1.001 - 1.035    Blood (UA POC) Negative Negative    pH (UA POC) 5.5 4.6 - 8.0    Protein (UA POC) Negative Negative    Urobilinogen (UA POC) 0.2 mg/dL 0.2 - 1    Nitrites (UA POC) Negative Negative    Leukocyte esterase (UA POC) Negative Negative   AMB POC HEMOGLOBIN A1C   Result Value Ref Range    Hemoglobin A1c (POC) 6.4 (A) 4.8 - 5.6 %   AMB POC LIPID PROFILE   Result Value Ref Range    Cholesterol (POC) 153 100 - 199 mg/dL    Triglycerides (POC) 106 0 - 150 mg/dL    HDL Cholesterol (POC) 34 (A) 35 - 150 mg/dL    LDL Cholesterol (POC) 97 0 - 129 mg/dL    Non-HDL Goal (POC) 811119     TChol/HDL Ratio (POC) 4.5 0.0 - 5.0 CALC   AMB POC COMPLETE CBC,AUTOMATED ENTER   Result Value Ref Range    WBC (POC) 6.9 4.5 - 10.5 K/uL    LYMPHOCYTES (POC) 27.6 20.5 - 51.1 %    MONOCYTES (POC) 4.0 1.7 - 9.3 %    GRANULOCYTES (POC) 68.4 42.2 - 75.2 %    ABS. LYMPHS (POC) 1.9 1.2 - 3.4 K/uL    ABS. MONOS (POC) 0.3 0.1 - 0.6 10^3/ul    ABS. GRANS (POC) 4.7 1.4 - 6.5 10^3/ul    RBC (POC) 4.68 4.00 - 6.00 M/uL    HGB (POC) 13.8 11 - 18 g/dL    HCT (POC) 91.441.2 78.235.0 - 60.0 %    MCV (POC) 88.0 80.0 - 99.9 fL    MCH (POC) 29.6 27.0 - 31.0 pg    MCHC (POC) 33.6 33.0 - 37.0 g/dL    RDW (POC) 95.614.6 (A) 21.311.6 - 13.7 %    PLATELET (POC) 251 150 - 450 K/uL    MPV (POC) 7.9 7.8 - 11 fL  Assessment/Plan      ICD-10-CM ICD-9-CM    1. Routine general medical examination at a health care facility Z00.00 V70.0 AMB POC URINALYSIS DIP STICK MANUAL W/O MICRO      AMB POC HEMOGLOBIN A1C      AMB POC LIPID PROFILE      COLLECTION VENOUS BLOOD,VENIPUNCTURE      METABOLIC PANEL, COMPREHENSIVE      TSH 3RD GENERATION      AMB POC COMPLETE CBC,AUTOMATED ENTER   2. Postmenopausal Z78.0 V49.81 DEXA BONE DENSITY STUDY AXIAL   3. Breast screening Z12.31 V76.10 MAM MAMMO BI SCREENING INCL CAD   4. Hypertension complicating diabetes (HCC) E11.59 250.80     I10 401.9     5. Essential hypertension I10 401.9    6. Atherosclerosis of abdominal aorta (HCC) I70.0 440.0    7. Morbid obesity due to excess calories (HCC) E66.01 278.01      Carotid dopplers  Mammogram  Bone density  shingrix  F/u 3 months. Order low dose chest ct then    AUTHOR:  Lake Bells, MD 03/23/2018 7:30 AM

## 2018-03-23 NOTE — Patient Instructions (Signed)
Results for orders placed or performed in visit on 03/23/18   AMB POC LIPID PROFILE   Result Value Ref Range    Cholesterol (POC) 153 100 - 199 mg/dL    Triglycerides (POC) 106 0 - 150 mg/dL    HDL Cholesterol (POC) 34 (A) 35 - 150 mg/dL    LDL Cholesterol (POC) 97 0 - 129 mg/dL    Non-HDL Goal (POC) 387119     TChol/HDL Ratio (POC) 4.5 0.0 - 5.0 CALC   AMB POC COMPLETE CBC,AUTOMATED ENTER   Result Value Ref Range    WBC (POC) 6.9 4.5 - 10.5 K/uL    LYMPHOCYTES (POC) 27.6 20.5 - 51.1 %    MONOCYTES (POC) 4.0 1.7 - 9.3 %    GRANULOCYTES (POC) 68.4 42.2 - 75.2 %    ABS. LYMPHS (POC) 1.9 1.2 - 3.4 K/uL    ABS. MONOS (POC) 0.3 0.1 - 0.6 10^3/ul    ABS. GRANS (POC) 4.7 1.4 - 6.5 10^3/ul    RBC (POC) 4.68 4.00 - 6.00 M/uL    HGB (POC) 13.8 11 - 18 g/dL    HCT (POC) 56.441.2 33.235.0 - 60.0 %    MCV (POC) 88.0 80.0 - 99.9 fL    MCH (POC) 29.6 27.0 - 31.0 pg    MCHC (POC) 33.6 33.0 - 37.0 g/dL    RDW (POC) 95.114.6 (A) 88.411.6 - 13.7 %    PLATELET (POC) 251 150 - 450 K/uL    MPV (POC) 7.9 7.8 - 11 fL   mammogram,  Bone density  shingrix  Dopplers  Lung ct in Fall

## 2018-03-23 NOTE — Progress Notes (Signed)
History and Physical    Dana [name] is a 62 y.o. female presents for physical exam.  Feeling better.      Past Medical History:   Diagnosis Date   ??? Atherosclerosis of abdominal aorta (HCC)    ??? Diabetes (HCC) 01/24/2013   ??? GERD (gastroesophageal reflux disease)    ??? Gout    ??? Hives    ??? HTN (hypertension)    ??? Hypercholesterolemia    ??? Perforated gastric ulcer (HCC)    ??? Postprocedural intraabdominal abscess      Past Surgical History:   Procedure Laterality Date   ??? ABDOMEN SURGERY PROC UNLISTED  05/12/2017    Lap exploratory perforated posterior ulcer repair by Dr. Amada JupiterShindel    ??? HX HYSTERECTOMY     ??? HX OTHER SURGICAL  05/13/2017    PROCEDURES PERFORMED:  Exploratory laparotomy, repair of perforated posterior ulcer with oversew and onlay omental patch-[facility]-DR. S. Shindel   ??? HX OTHER SURGICAL  08/09/2017     Abdominal drain replacement with irrigation of abdominal fistula tract-[facility]-Dr. Hulen LusterS. Suy     Current Outpatient Medications   Medication Sig Dispense Refill   ??? amLODIPine (NORVASC) 5 mg tablet TAKE 1 TABLET BY MOUTH EVERY DAY 30 Tab 1   ??? metFORMIN ER (GLUCOPHAGE XR) 500 mg tablet TAKE 1 TABLET EVERY MORNING AND 2 TABLETS WITH DINNER 270 Tab 2   ??? pantoprazole (PROTONIX) 40 mg tablet TAKE 1 TABLET BY MOUTH TWICE A DAY 60 Tab 3   ??? sertraline (ZOLOFT) 100 mg tablet TAKE 1 TABLET BY MOUTH DAILY 90 Tab 2   ??? insulin NPH (HUMULIN N NPH INSULIN KWIKPEN) 100 unit/mL (3 mL) inpn INJECT 32 UNITS SUBCUTANEOUSLY AT BEDTIME 5 Adjustable Dose Pre-filled Pen Syringe 2   ??? simvastatin (ZOCOR) 40 mg tablet TAKE 1 TABLET NIGHTLY 90 Tab 1   ??? triamterene-hydroCHLOROthiazide (MAXZIDE) 37.5-25 mg per tablet TAKE 1 TABLET DAILY 90 Tab 2   ??? colchicine 0.6 mg tablet Take 1 Tab by mouth two (2) times a day. 20 Tab 3     Allergies   Allergen Reactions   ??? Adhesive Itching   ??? Dolobid [Diflunisal] Itching     Itching,swelling     Social History     Socioeconomic History   ??? Marital status: SINGLE     Spouse name: Not on file   ???  Number of children: Not on file   ??? Years of education: Not on file   ??? Highest education level: Not on file   Tobacco Use   ??? Smoking status: Former Smoker     Packs/day: 1.00     Last attempt to quit: 06/27/2011     Years since quitting: 6.7   ??? Smokeless tobacco: Never Used   Substance and Sexual Activity   ??? Alcohol use: Yes     Alcohol/week: 1.0 - 2.0 standard drinks     Types: 1 - 2 Glasses of wine per week     Comment: Per month    ??? Drug use: No   ??? Sexual activity: Yes     Family History   Problem Relation Age of Onset   ??? Hypertension Mother    ??? Cancer Father         mesothelioma   ??? Diabetes Father    ??? Hypertension Father      The patient does not have a history of falls. A plan of care for falls was documented..  Depression screen pos-  treated    Review of Systems:  Denies dysphagia, chest pain, or SOB.  No nausea or vomiting, or diarrhea or constipation.  No fever, chills, night sweats, or cough.  No dysuria, frequency or abdominal pain.  No headaches.  Mammogram- not done  Colon- 04/28/17  Follow up 10 yrs.    Bone density not done    Yearly eye exam:   Yes  Cataracts - last August  Yearly dental exam: yes    Objective  Visit Vitals  BP 130/76   Ht 5' 3.5" (1.613 m)   Wt 227 lb (103 kg)   BMI 39.58 kg/m??     General appearance - well developed, well nourished 62 y.o. wf nad  Eyes - PERRL  Tm- intact  Pharynx- no lesions  Neck --Supple, no anterior cervical nodes, no thyromegaly.+bruits  Lungs --CTA  Heart - Regular rate and rhythm  Abdomen - soft, no tenderness or distention  Breasts - no suspicious masses, no axillary nodes  Pelvic - {s/p hyst  Extremities - no clubbing, cyanosis or edema  Good pulses, no lesions    Results for orders placed or performed in visit on 03/23/18   AMB POC URINALYSIS DIP STICK MANUAL W/O MICRO   Result Value Ref Range    Color (UA POC) Yellow     Clarity (UA POC) Clear     Glucose (UA POC) Negative Negative    Bilirubin (UA POC) Negative Negative    Ketones (UA POC)  Negative Negative    Specific gravity (UA POC) 1.015 1.001 - 1.035    Blood (UA POC) Negative Negative    pH (UA POC) 5.5 4.6 - 8.0    Protein (UA POC) Negative Negative    Urobilinogen (UA POC) 0.2 mg/dL 0.2 - 1    Nitrites (UA POC) Negative Negative    Leukocyte esterase (UA POC) Negative Negative   AMB POC HEMOGLOBIN A1C   Result Value Ref Range    Hemoglobin A1c (POC) 6.4 (A) 4.8 - 5.6 %   AMB POC LIPID PROFILE   Result Value Ref Range    Cholesterol (POC) 153 100 - 199 mg/dL    Triglycerides (POC) 106 0 - 150 mg/dL    HDL Cholesterol (POC) 34 (A) 35 - 150 mg/dL    LDL Cholesterol (POC) 97 0 - 129 mg/dL    Non-HDL Goal (POC) 161     TChol/HDL Ratio (POC) 4.5 0.0 - 5.0 CALC   AMB POC COMPLETE CBC,AUTOMATED ENTER   Result Value Ref Range    WBC (POC) 6.9 4.5 - 10.5 K/uL    LYMPHOCYTES (POC) 27.6 20.5 - 51.1 %    MONOCYTES (POC) 4.0 1.7 - 9.3 %    GRANULOCYTES (POC) 68.4 42.2 - 75.2 %    ABS. LYMPHS (POC) 1.9 1.2 - 3.4 K/uL    ABS. MONOS (POC) 0.3 0.1 - 0.6 10^3/ul    ABS. GRANS (POC) 4.7 1.4 - 6.5 10^3/ul    RBC (POC) 4.68 4.00 - 6.00 M/uL    HGB (POC) 13.8 11 - 18 g/dL    HCT (POC) 09.6 04.5 - 60.0 %    MCV (POC) 88.0 80.0 - 99.9 fL    MCH (POC) 29.6 27.0 - 31.0 pg    MCHC (POC) 33.6 33.0 - 37.0 g/dL    RDW (POC) 40.9 (A) 81.1 - 13.7 %    PLATELET (POC) 251 150 - 450 K/uL    MPV (POC) 7.9 7.8 - 11 fL  Assessment/Plan      ICD-10-CM ICD-9-CM    1. Routine general medical examination at a health care facility Z00.00 V70.0 AMB POC URINALYSIS DIP STICK MANUAL W/O MICRO      AMB POC HEMOGLOBIN A1C      AMB POC LIPID PROFILE      COLLECTION VENOUS BLOOD,VENIPUNCTURE      METABOLIC PANEL, COMPREHENSIVE      TSH 3RD GENERATION      AMB POC COMPLETE CBC,AUTOMATED ENTER   2. Postmenopausal Z78.0 V49.81 DEXA BONE DENSITY STUDY AXIAL   3. Breast screening Z12.31 V76.10 MAM MAMMO BI SCREENING INCL CAD   4. Hypertension complicating diabetes (HCC) E11.59 250.80     I10 401.9    5. Essential hypertension I10 401.9    6.  Atherosclerosis of abdominal aorta (HCC) I70.0 440.0    7. Morbid obesity due to excess calories (HCC) E66.01 278.01      Carotid dopplers  Mammogram  Bone density  shingrix  F/u 3 months. Order low dose chest ct then    AUTHOR:  Lake Bells, MD 03/23/2018 7:30 AM

## 2018-03-24 LAB — METABOLIC PANEL, COMPREHENSIVE
A-G Ratio: 1.6 (ref 1.2–2.2)
ALT (SGPT): 18 IU/L (ref 0–32)
AST (SGOT): 15 IU/L (ref 0–40)
Albumin: 3.9 g/dL (ref 3.6–4.8)
Alk. phosphatase: 64 IU/L (ref 39–117)
BUN/Creatinine ratio: 22 (ref 12–28)
BUN: 16 mg/dL (ref 8–27)
Bilirubin, total: 0.3 mg/dL (ref 0.0–1.2)
CO2: 20 mmol/L (ref 20–29)
Calcium: 9.2 mg/dL (ref 8.7–10.3)
Chloride: 108 mmol/L — ABNORMAL HIGH (ref 96–106)
Creatinine: 0.73 mg/dL (ref 0.57–1.00)
GFR est AA: 104 mL/min/{1.73_m2} (ref >59)
GFR est non-AA: 90 mL/min/{1.73_m2} (ref >59)
GLOBULIN, TOTAL: 2.5 g/dL (ref 1.5–4.5)
Glucose: 81 mg/dL (ref 65–99)
Potassium: 3.9 mmol/L (ref 3.5–5.2)
Protein, total: 6.4 g/dL (ref 6.0–8.5)
Sodium: 146 mmol/L — ABNORMAL HIGH (ref 134–144)

## 2018-03-24 LAB — TSH 3RD GENERATION
TSH: 2.64 u[IU]/mL (ref 0.450–4.500)
TSH: 2.64 u[IU]/mL (ref 0.450–4.500)

## 2018-03-24 LAB — COMPREHENSIVE METABOLIC PANEL
ALT: 18 IU/L (ref 0–32)
AST: 15 IU/L (ref 0–40)
Albumin/Globulin Ratio: 1.6 NA (ref 1.2–2.2)
Albumin: 3.9 g/dL (ref 3.6–4.8)
Alkaline Phosphatase: 64 IU/L (ref 39–117)
BUN: 16 mg/dL (ref 8–27)
Bun/Cre Ratio: 22 NA (ref 12–28)
CO2: 20 mmol/L (ref 20–29)
Calcium: 9.2 mg/dL (ref 8.7–10.3)
Chloride: 108 mmol/L — ABNORMAL HIGH (ref 96–106)
Creatinine: 0.73 mg/dL (ref 0.57–1.00)
EGFR IF NonAfrican American: 90 mL/min/{1.73_m2} (ref >59)
GFR African American: 104 mL/min/{1.73_m2} (ref >59)
Globulin, Total: 2.5 g/dL (ref 1.5–4.5)
Glucose: 81 mg/dL (ref 65–99)
Potassium: 3.9 mmol/L (ref 3.5–5.2)
Sodium: 146 mmol/L — ABNORMAL HIGH (ref 134–144)
Total Bilirubin: 0.3 mg/dL (ref 0.0–1.2)
Total Protein: 6.4 g/dL (ref 6.0–8.5)

## 2018-03-29 ENCOUNTER — Encounter

## 2018-03-29 MED ORDER — INSULIN NPH HUMAN RECOMB 100 UNIT/ML (3 ML) SUB-Q PEN
100 unit/mL (3 mL) | SUBCUTANEOUS | 0 refills | Status: DC
Start: 2018-03-29 — End: 2018-12-14

## 2018-04-20 MED ORDER — SIMVASTATIN 40 MG TAB
40 mg | ORAL_TABLET | ORAL | 1 refills | Status: DC
Start: 2018-04-20 — End: 2018-10-17

## 2018-05-05 ENCOUNTER — Inpatient Hospital Stay: Admit: 2018-05-05 | Payer: BLUE CROSS/BLUE SHIELD | Attending: Internal Medicine | Primary: Internal Medicine

## 2018-05-05 ENCOUNTER — Encounter

## 2018-05-05 DIAGNOSIS — Z1231 Encounter for screening mammogram for malignant neoplasm of breast: Secondary | ICD-10-CM

## 2018-05-05 DIAGNOSIS — Z1239 Encounter for other screening for malignant neoplasm of breast: Secondary | ICD-10-CM

## 2018-05-05 NOTE — Progress Notes (Signed)
Called and left message.

## 2018-05-05 NOTE — Progress Notes (Signed)
pls call- see previous note.  congrats on good bone density

## 2018-05-05 NOTE — Progress Notes (Signed)
pls call- congrats on good mammogram

## 2018-05-05 NOTE — Progress Notes (Signed)
Called and left message.

## 2018-05-05 NOTE — Progress Notes (Signed)
pls call- see previous note.  congrats on good bone density

## 2018-05-13 MED ORDER — PANTOPRAZOLE 40 MG TAB, DELAYED RELEASE
40 mg | ORAL_TABLET | ORAL | 1 refills | Status: DC
Start: 2018-05-13 — End: 2018-11-14

## 2018-05-23 MED ORDER — AMLODIPINE 5 MG TAB
5 mg | ORAL_TABLET | ORAL | 1 refills | Status: DC
Start: 2018-05-23 — End: 2018-10-29

## 2018-06-12 ENCOUNTER — Encounter

## 2018-06-19 ENCOUNTER — Ambulatory Visit: Admit: 2018-06-19 | Attending: Internal Medicine | Primary: Internal Medicine

## 2018-06-19 ENCOUNTER — Ambulatory Visit: Attending: Internal Medicine | Primary: Internal Medicine

## 2018-06-19 DIAGNOSIS — E1165 Type 2 diabetes mellitus with hyperglycemia: Secondary | ICD-10-CM

## 2018-06-19 LAB — AMB POC URINALYSIS DIP STICK MANUAL W/O MICRO
Bilirubin (UA POC): NEGATIVE
Bilirubin, Urine, POC: NEGATIVE
Glucose (UA POC): NEGATIVE
Glucose, Urine, POC: NEGATIVE
Ketones (UA POC): NEGATIVE
Ketones, Urine, POC: NEGATIVE
Leukocyte Esterase, Urine, POC: NEGATIVE
Leukocyte esterase (UA POC): NEGATIVE
Nitrite, Urine, POC: NEGATIVE
Nitrites (UA POC): NEGATIVE
Protein (UA POC): NEGATIVE
Protein, Urine, POC: NEGATIVE
Specific Gravity, Urine, POC: 1.02 NA (ref 1.001–1.035)
Specific gravity (UA POC): 1.02 (ref 1.001–1.035)
Urobilinogen (UA POC): 0.2 (ref 0.2–1)
Urobilinogen, POC: 0.2 (ref 0.2–1)
pH (UA POC): 5.5 (ref 4.6–8.0)
pH, Urine, POC: 5.5 NA (ref 4.6–8.0)

## 2018-06-19 LAB — AMB POC HEMOGLOBIN A1C
Hemoglobin A1C, POC: 7.1 % — AB (ref 4.8–5.6)
Hemoglobin A1c (POC): 7.1 % — AB (ref 4.8–5.6)

## 2018-06-19 LAB — AMB POC LIPID PROFILE
Cholesterol (POC): 146 mg/dL (ref 100–199)
Cholesterol, POC: 146 mg/dL (ref 100–199)
HDL Cholesterol (POC): 33 mg/dL — AB (ref 35–150)
HDL Cholesterol, POC: 33 mg/dL — AB (ref 35–150)
LDL Cholesterol (POC): 86 mg/dL (ref 0–129)
LDL Cholesterol, POC: 86 mg/dL (ref 0–129)
Non-HDL Goal (POC): 113
Non-HDL Goal, POC: 113 NA
TChol/HDL Ratio (POC): 4.4 CALC (ref 0.0–5.0)
TChol/HDL Ratio (POC): 4.4 CALC (ref 0.0–5.0)
Triglycerides (POC): 137 mg/dL (ref 0–150)
Triglycerides, POC: 137 mg/dL (ref 0–150)

## 2018-06-19 NOTE — Patient Instructions (Signed)
Results for orders placed or performed in visit on 06/19/18   AMB POC URINALYSIS DIP STICK MANUAL W/O MICRO   Result Value Ref Range    Color (UA POC) Yellow     Clarity (UA POC) Clear     Glucose (UA POC) Negative Negative    Bilirubin (UA POC) Negative Negative    Ketones (UA POC) Negative Negative    Specific gravity (UA POC) 1.020 1.001 - 1.035    Blood (UA POC) Trace Negative    pH (UA POC) 5.5 4.6 - 8.0    Protein (UA POC) Negative Negative    Urobilinogen (UA POC) 0.2 mg/dL 0.2 - 1    Nitrites (UA POC) Negative Negative    Leukocyte esterase (UA POC) Negative Negative   AMB POC HEMOGLOBIN A1C   Result Value Ref Range    Hemoglobin A1c (POC) 7.1 (A) 4.8 - 5.6 %   AMB POC LIPID PROFILE   Result Value Ref Range    Cholesterol (POC) 146 100 - 199 mg/dL    Triglycerides (POC) 137 0 - 150 mg/dL    HDL Cholesterol (POC) 33 (A) 35 - 150 mg/dL    LDL Cholesterol (POC) 86 0 - 129 mg/dL    Non-HDL Goal (POC) 914     TChol/HDL Ratio (POC) 4.4 0.0 - 5.0 CALC     Follow up 3 months

## 2018-06-19 NOTE — Progress Notes (Signed)
pls call- kidney and liver function fine,  Continue working on diet and exercise. Follow up 3 months

## 2018-06-19 NOTE — Progress Notes (Signed)
Dana [name] is a 62 y.o. female and presents with   Chief Complaint   Patient presents with   ??? Follow-up   .  Here for follow up.  Mammogram and bone density are fine.  Thinks she is getting a cold. Denies chest pain, sob,  Does not check bs often.      Current Outpatient Medications   Medication Sig Dispense Refill   ??? hydrOXYzine HCl (ATARAX) 50 mg tablet Take 50 mg by mouth three (3) times daily as needed for Itching.     ??? melatonin 5 mg cap capsule Take 5 mg by mouth nightly.     ??? amLODIPine (NORVASC) 5 mg tablet TAKE 1 TABLET BY MOUTH EVERY DAY 30 Tab 1   ??? pantoprazole (PROTONIX) 40 mg tablet TAKE 1 TABLET BY MOUTH TWICE A DAY 180 Tab 1   ??? simvastatin (ZOCOR) 40 mg tablet TAKE 1 TABLET NIGHTLY 90 Tab 1   ??? insulin NPH (HUMULIN N NPH INSULIN KWIKPEN) 100 unit/mL (3 mL) inpn INJECT 32 UNITS SUBCUTANEOUSLY AT BEDTIME 45 Adjustable Dose Pre-filled Pen Syringe 0   ??? metFORMIN ER (GLUCOPHAGE XR) 500 mg tablet TAKE 1 TABLET EVERY MORNING AND 2 TABLETS WITH DINNER 270 Tab 2   ??? sertraline (ZOLOFT) 100 mg tablet TAKE 1 TABLET BY MOUTH DAILY 90 Tab 2   ??? triamterene-hydroCHLOROthiazide (MAXZIDE) 37.5-25 mg per tablet TAKE 1 TABLET DAILY 90 Tab 2   ??? colchicine 0.6 mg tablet Take 1 Tab by mouth two (2) times a day. 20 Tab 3     Allergies   Allergen Reactions   ??? Adhesive Itching   ??? Dolobid [Diflunisal] Itching     Itching,swelling     Past Medical History:   Diagnosis Date   ??? Atherosclerosis of abdominal aorta (HCC)    ??? Diabetes (HCC) 01/24/2013   ??? GERD (gastroesophageal reflux disease)    ??? Gout    ??? Hives    ??? HTN (hypertension)    ??? Hypercholesterolemia    ??? Perforated gastric ulcer (HCC)    ??? Postprocedural intraabdominal abscess      Past Surgical History:   Procedure Laterality Date   ??? ABDOMEN SURGERY PROC UNLISTED  05/12/2017    Lap exploratory perforated posterior ulcer repair by Dr. Amada Jupiter    ??? HX HYSTERECTOMY     ??? HX OTHER SURGICAL  05/13/2017     PROCEDURES PERFORMED:  Exploratory laparotomy, repair of perforated posterior ulcer with oversew and onlay omental patch-[facility]-DR. S. Shindel   ??? HX OTHER SURGICAL  08/09/2017     Abdominal drain replacement with irrigation of abdominal fistula tract-[facility]-Dr. Hulen Luster     Family History   Problem Relation Age of Onset   ??? Hypertension Mother    ??? Cancer Father         mesothelioma   ??? Diabetes Father    ??? Hypertension Father      Social History     Tobacco Use   ??? Smoking status: Former Smoker     Packs/day: 1.00     Last attempt to quit: 06/27/2011     Years since quitting: 6.9   ??? Smokeless tobacco: Never Used   Substance Use Topics   ??? Alcohol use: Yes     Alcohol/week: 1.0 - 2.0 standard drinks     Types: 1 - 2 Glasses of wine per week     Comment: Per month       The patient does not  have a history of falls. A plan of care for falls was documented..  Depression screen positive,- controlled.         Objective:  Visit Vitals  BP 120/90   Ht 5' 3.5" (1.613 m)   Wt 229 lb (103.9 kg)   BMI 39.93 kg/m??     Heavy 62 yo wf  In NAD. A&O.  HEENT -- Pupils round.  O/P + pnd.  Tm- cerumen  Neck -- Supple. No JVD.no acn  Heart -- RRR. No R/M/G.  Lungs -- CTA.  Abdomen -- Soft. Non-tender. Non-distended. No masses. Bowel sounds present.  Extremities -- No edema. Good pulses, no lesions      Results for orders placed or performed in visit on 06/19/18   AMB POC URINALYSIS DIP STICK MANUAL W/O MICRO   Result Value Ref Range    Color (UA POC) Yellow     Clarity (UA POC) Clear     Glucose (UA POC) Negative Negative    Bilirubin (UA POC) Negative Negative    Ketones (UA POC) Negative Negative    Specific gravity (UA POC) 1.020 1.001 - 1.035    Blood (UA POC) Trace Negative    pH (UA POC) 5.5 4.6 - 8.0    Protein (UA POC) Negative Negative    Urobilinogen (UA POC) 0.2 mg/dL 0.2 - 1    Nitrites (UA POC) Negative Negative    Leukocyte esterase (UA POC) Negative Negative   AMB POC HEMOGLOBIN A1C   Result Value Ref Range     Hemoglobin A1c (POC) 7.1 (A) 4.8 - 5.6 %   AMB POC LIPID PROFILE   Result Value Ref Range    Cholesterol (POC) 146 100 - 199 mg/dL    Triglycerides (POC) 137 0 - 150 mg/dL    HDL Cholesterol (POC) 33 (A) 35 - 150 mg/dL    LDL Cholesterol (POC) 86 0 - 129 mg/dL    Non-HDL Goal (POC) 161113     TChol/HDL Ratio (POC) 4.4 0.0 - 5.0 CALC       Assessment/Plan:    ICD-10-CM ICD-9-CM    1. Type 2 diabetes mellitus with hyperglycemia, with long-term current use of insulin (HCC) E11.65 250.00 AMB POC HEMOGLOBIN A1C    Z79.4 790.29 AMB POC LIPID PROFILE     V58.67 COLLECTION VENOUS BLOOD,VENIPUNCTURE      METABOLIC PANEL, COMPREHENSIVE   2. Hypertension complicating diabetes (HCC) E11.59 250.80 AMB POC URINALYSIS DIP STICK MANUAL W/O MICRO    I10 401.9    3. Need for influenza vaccination Z23 V04.81 INFLUENZA VIRUS VACCINE, PRESERVATIVE FREE SYRINGE, 3 YRS AND OLDER   4. Atherosclerosis of abdominal aorta (HCC) I70.0 440.0    5. Morbid obesity due to excess calories (HCC) E66.01 278.01      Follow up 3 months.      Author:  Lake BellsNancy J Andria Head, MD 06/19/2018 8:40 PM

## 2018-06-19 NOTE — Progress Notes (Signed)
Called patient

## 2018-06-19 NOTE — Progress Notes (Signed)
Flu shot given in left deltoid

## 2018-06-19 NOTE — Progress Notes (Signed)
pls call- kidney and liver function fine,  Continue working on diet and exercise. Follow up 3 months

## 2018-06-19 NOTE — Progress Notes (Signed)
Dana [name] is a 62 y.o. female and presents with   Chief Complaint   Patient presents with   ??? Follow-up   .  Here for follow up.  Mammogram and bone density are fine.  Thinks she is getting a cold. Denies chest pain, sob,  Does not check bs often.      Current Outpatient Medications   Medication Sig Dispense Refill   ??? hydrOXYzine HCl (ATARAX) 50 mg tablet Take 50 mg by mouth three (3) times daily as needed for Itching.     ??? melatonin 5 mg cap capsule Take 5 mg by mouth nightly.     ??? amLODIPine (NORVASC) 5 mg tablet TAKE 1 TABLET BY MOUTH EVERY DAY 30 Tab 1   ??? pantoprazole (PROTONIX) 40 mg tablet TAKE 1 TABLET BY MOUTH TWICE A DAY 180 Tab 1   ??? simvastatin (ZOCOR) 40 mg tablet TAKE 1 TABLET NIGHTLY 90 Tab 1   ??? insulin NPH (HUMULIN N NPH INSULIN KWIKPEN) 100 unit/mL (3 mL) inpn INJECT 32 UNITS SUBCUTANEOUSLY AT BEDTIME 45 Adjustable Dose Pre-filled Pen Syringe 0   ??? metFORMIN ER (GLUCOPHAGE XR) 500 mg tablet TAKE 1 TABLET EVERY MORNING AND 2 TABLETS WITH DINNER 270 Tab 2   ??? sertraline (ZOLOFT) 100 mg tablet TAKE 1 TABLET BY MOUTH DAILY 90 Tab 2   ??? triamterene-hydroCHLOROthiazide (MAXZIDE) 37.5-25 mg per tablet TAKE 1 TABLET DAILY 90 Tab 2   ??? colchicine 0.6 mg tablet Take 1 Tab by mouth two (2) times a day. 20 Tab 3     Allergies   Allergen Reactions   ??? Adhesive Itching   ??? Dolobid [Diflunisal] Itching     Itching,swelling     Past Medical History:   Diagnosis Date   ??? Atherosclerosis of abdominal aorta (HCC)    ??? Diabetes (HCC) 01/24/2013   ??? GERD (gastroesophageal reflux disease)    ??? Gout    ??? Hives    ??? HTN (hypertension)    ??? Hypercholesterolemia    ??? Perforated gastric ulcer (HCC)    ??? Postprocedural intraabdominal abscess      Past Surgical History:   Procedure Laterality Date   ??? ABDOMEN SURGERY PROC UNLISTED  05/12/2017    Lap exploratory perforated posterior ulcer repair by Dr. Amada Jupiter    ??? HX HYSTERECTOMY     ??? HX OTHER SURGICAL  05/13/2017    PROCEDURES PERFORMED:  Exploratory laparotomy,  repair of perforated posterior ulcer with oversew and onlay omental patch-[facility]-DR. S. Shindel   ??? HX OTHER SURGICAL  08/09/2017     Abdominal drain replacement with irrigation of abdominal fistula tract-[facility]-Dr. Hulen Luster     Family History   Problem Relation Age of Onset   ??? Hypertension Mother    ??? Cancer Father         mesothelioma   ??? Diabetes Father    ??? Hypertension Father      Social History     Tobacco Use   ??? Smoking status: Former Smoker     Packs/day: 1.00     Last attempt to quit: 06/27/2011     Years since quitting: 6.9   ??? Smokeless tobacco: Never Used   Substance Use Topics   ??? Alcohol use: Yes     Alcohol/week: 1.0 - 2.0 standard drinks     Types: 1 - 2 Glasses of wine per week     Comment: Per month       The patient does not  have a history of falls. A plan of care for falls was documented..  Depression screen positive,- controlled.         Objective:  Visit Vitals  BP 120/90   Ht 5' 3.5" (1.613 m)   Wt 229 lb (103.9 kg)   BMI 39.93 kg/m??     Heavy 62 yo wf  In NAD. A&O.  HEENT -- Pupils round.  O/P + pnd.  Tm- cerumen  Neck -- Supple. No JVD.no acn  Heart -- RRR. No R/M/G.  Lungs -- CTA.  Abdomen -- Soft. Non-tender. Non-distended. No masses. Bowel sounds present.  Extremities -- No edema. Good pulses, no lesions      Results for orders placed or performed in visit on 06/19/18   AMB POC URINALYSIS DIP STICK MANUAL W/O MICRO   Result Value Ref Range    Color (UA POC) Yellow     Clarity (UA POC) Clear     Glucose (UA POC) Negative Negative    Bilirubin (UA POC) Negative Negative    Ketones (UA POC) Negative Negative    Specific gravity (UA POC) 1.020 1.001 - 1.035    Blood (UA POC) Trace Negative    pH (UA POC) 5.5 4.6 - 8.0    Protein (UA POC) Negative Negative    Urobilinogen (UA POC) 0.2 mg/dL 0.2 - 1    Nitrites (UA POC) Negative Negative    Leukocyte esterase (UA POC) Negative Negative   AMB POC HEMOGLOBIN A1C   Result Value Ref Range    Hemoglobin A1c (POC) 7.1 (A) 4.8 - 5.6 %   AMB POC LIPID  PROFILE   Result Value Ref Range    Cholesterol (POC) 146 100 - 199 mg/dL    Triglycerides (POC) 137 0 - 150 mg/dL    HDL Cholesterol (POC) 33 (A) 35 - 150 mg/dL    LDL Cholesterol (POC) 86 0 - 129 mg/dL    Non-HDL Goal (POC) 161113     TChol/HDL Ratio (POC) 4.4 0.0 - 5.0 CALC       Assessment/Plan:    ICD-10-CM ICD-9-CM    1. Type 2 diabetes mellitus with hyperglycemia, with long-term current use of insulin (HCC) E11.65 250.00 AMB POC HEMOGLOBIN A1C    Z79.4 790.29 AMB POC LIPID PROFILE     V58.67 COLLECTION VENOUS BLOOD,VENIPUNCTURE      METABOLIC PANEL, COMPREHENSIVE   2. Hypertension complicating diabetes (HCC) E11.59 250.80 AMB POC URINALYSIS DIP STICK MANUAL W/O MICRO    I10 401.9    3. Need for influenza vaccination Z23 V04.81 INFLUENZA VIRUS VACCINE, PRESERVATIVE FREE SYRINGE, 3 YRS AND OLDER   4. Atherosclerosis of abdominal aorta (HCC) I70.0 440.0    5. Morbid obesity due to excess calories (HCC) E66.01 278.01      Follow up 3 months.      Author:  Lake BellsNancy J Kaori Jumper, MD 06/19/2018 8:40 PM

## 2018-06-20 LAB — METABOLIC PANEL, COMPREHENSIVE
A-G Ratio: 1.8 (ref 1.2–2.2)
ALT (SGPT): 18 IU/L (ref 0–32)
AST (SGOT): 17 IU/L (ref 0–40)
Albumin: 4 g/dL (ref 3.6–4.8)
Alk. phosphatase: 57 IU/L (ref 39–117)
BUN/Creatinine ratio: 26 (ref 12–28)
BUN: 20 mg/dL (ref 8–27)
Bilirubin, total: 0.5 mg/dL (ref 0.0–1.2)
CO2: 21 mmol/L (ref 20–29)
Calcium: 9.1 mg/dL (ref 8.7–10.3)
Chloride: 103 mmol/L (ref 96–106)
Creatinine: 0.78 mg/dL (ref 0.57–1.00)
GFR est AA: 96 mL/min/{1.73_m2} (ref >59)
GFR est non-AA: 83 mL/min/{1.73_m2} (ref >59)
GLOBULIN, TOTAL: 2.2 g/dL (ref 1.5–4.5)
Glucose: 106 mg/dL — ABNORMAL HIGH (ref 65–99)
Potassium: 4.1 mmol/L (ref 3.5–5.2)
Protein, total: 6.2 g/dL (ref 6.0–8.5)
Sodium: 141 mmol/L (ref 134–144)

## 2018-06-20 LAB — COMPREHENSIVE METABOLIC PANEL
ALT: 18 IU/L (ref 0–32)
AST: 17 IU/L (ref 0–40)
Albumin/Globulin Ratio: 1.8 NA (ref 1.2–2.2)
Albumin: 4 g/dL (ref 3.6–4.8)
Alkaline Phosphatase: 57 IU/L (ref 39–117)
BUN: 20 mg/dL (ref 8–27)
Bun/Cre Ratio: 26 NA (ref 12–28)
CO2: 21 mmol/L (ref 20–29)
Calcium: 9.1 mg/dL (ref 8.7–10.3)
Chloride: 103 mmol/L (ref 96–106)
Creatinine: 0.78 mg/dL (ref 0.57–1.00)
EGFR IF NonAfrican American: 83 mL/min/{1.73_m2} (ref >59)
GFR African American: 96 mL/min/{1.73_m2} (ref >59)
Globulin, Total: 2.2 g/dL (ref 1.5–4.5)
Glucose: 106 mg/dL — ABNORMAL HIGH (ref 65–99)
Potassium: 4.1 mmol/L (ref 3.5–5.2)
Sodium: 141 mmol/L (ref 134–144)
Total Bilirubin: 0.5 mg/dL (ref 0.0–1.2)
Total Protein: 6.2 g/dL (ref 6.0–8.5)

## 2018-06-29 MED ORDER — TRIAMTERENE-HYDROCHLOROTHIAZIDE 37.5 MG-25 MG TAB
ORAL_TABLET | ORAL | 4 refills | Status: DC
Start: 2018-06-29 — End: 2018-12-14

## 2018-07-28 ENCOUNTER — Ambulatory Visit: Admit: 2018-07-28 | Attending: Internal Medicine | Primary: Internal Medicine

## 2018-07-28 ENCOUNTER — Ambulatory Visit: Attending: Internal Medicine | Primary: Internal Medicine

## 2018-07-28 DIAGNOSIS — J0100 Acute maxillary sinusitis, unspecified: Secondary | ICD-10-CM

## 2018-07-28 LAB — AMB POC COMPLETE CBC,AUTOMATED ENTER
ABS. GRANS (POC): 5.8 10*3/uL (ref 1.4–6.5)
ABS. LYMPHS (POC): 3.6 10*3/uL — AB (ref 1.2–3.4)
ABS. MONOS (POC): 0.7 10*3/uL — AB (ref 0.1–0.6)
GRANULOCYTES (POC): 57.1 % (ref 42.2–75.2)
Granulocytes %, POC: 57.1 % (ref 42.2–75.2)
Granulocytes Abs: 5.8 10*3/uL (ref 1.4–6.5)
HCT (POC): 45.1 % (ref 35.0–60.0)
HGB (POC): 14.4 g/dL (ref 11–18)
Hematocrit, POC: 45.1 % (ref 35.0–60.0)
Hemoglobin, POC: 14.4 g/dL (ref 11–18)
LYMPHOCYTES (POC): 35.6 % (ref 20.5–51.1)
Lymphocyte %: 35.6 % (ref 20.5–51.1)
Lymphs Abs: 3.6 10*3/uL — AB (ref 1.2–3.4)
MCH (POC): 29.2 pg (ref 27.0–31.0)
MCH: 29.2 pg (ref 27.0–31.0)
MCHC (POC): 31.9 g/dL — AB (ref 33.0–37.0)
MCHC: 31.9 g/dL — AB (ref 33.0–37.0)
MCV (POC): 91.5 fL (ref 80.0–99.9)
MCV: 91.5 fL (ref 80.0–99.9)
MONOCYTES (POC): 7.3 % (ref 1.7–9.3)
MPV (POC): 8.1 fL (ref 7.8–11)
MPV POC: 8.1 fL (ref 7.8–11)
Monocyte %: 7.3 % (ref 1.7–9.3)
Monocyte Absolute, POC: 0.7 10*3/uL — AB (ref 0.1–0.6)
PLATELET (POC): 230 10*3/uL (ref 150–450)
Platelet Count, POC: 230 10*3/uL (ref 150–450)
RBC (POC): 4.93 M/uL (ref 4.00–6.00)
RBC, POC: 4.93 M/uL (ref 4.00–6.00)
RDW (POC): 15.5 % — AB (ref 11.6–13.7)
RDW, POC: 15.5 % — AB (ref 11.6–13.7)
WBC (POC): 10.1 10*3/uL (ref 4.5–10.5)
WBC, POC: 10.1 10*3/uL (ref 4.5–10.5)

## 2018-07-28 MED ORDER — AZITHROMYCIN 250 MG TAB
250 mg | ORAL_TABLET | ORAL | 0 refills | Status: AC
Start: 2018-07-28 — End: 2018-08-02

## 2018-07-28 NOTE — Patient Instructions (Signed)
mucinex twice daily  zpak

## 2018-07-28 NOTE — Progress Notes (Signed)
Dana [name] is a 62 y.o. female and presents with   Chief Complaint   Patient presents with   ??? Sinus Infection   .  Has had sore throat x 3 days.  Then cough- productive ?? + fever and sweats.   No body aches.      Current Outpatient Medications   Medication Sig Dispense Refill   ??? azithromycin (ZITHROMAX) 250 mg tablet Take 1 Tab by mouth See Admin Instructions for 5 days. 6 Tab 0   ??? triamterene-hydroCHLOROthiazide (MAXZIDE) 37.5-25 mg per tablet TAKE 1 TABLET DAILY 90 Tab 4   ??? hydrOXYzine HCl (ATARAX) 50 mg tablet Take 50 mg by mouth three (3) times daily as needed for Itching.     ??? melatonin 5 mg cap capsule Take 5 mg by mouth nightly.     ??? amLODIPine (NORVASC) 5 mg tablet TAKE 1 TABLET BY MOUTH EVERY DAY 30 Tab 1   ??? pantoprazole (PROTONIX) 40 mg tablet TAKE 1 TABLET BY MOUTH TWICE A DAY 180 Tab 1   ??? simvastatin (ZOCOR) 40 mg tablet TAKE 1 TABLET NIGHTLY 90 Tab 1   ??? insulin NPH (HUMULIN N NPH INSULIN KWIKPEN) 100 unit/mL (3 mL) inpn INJECT 32 UNITS SUBCUTANEOUSLY AT BEDTIME 45 Adjustable Dose Pre-filled Pen Syringe 0   ??? metFORMIN ER (GLUCOPHAGE XR) 500 mg tablet TAKE 1 TABLET EVERY MORNING AND 2 TABLETS WITH DINNER 270 Tab 2   ??? sertraline (ZOLOFT) 100 mg tablet TAKE 1 TABLET BY MOUTH DAILY 90 Tab 2   ??? colchicine 0.6 mg tablet Take 1 Tab by mouth two (2) times a day. 20 Tab 3     Allergies   Allergen Reactions   ??? Adhesive Itching   ??? Dolobid [Diflunisal] Itching     Itching,swelling     Past Medical History:   Diagnosis Date   ??? Atherosclerosis of abdominal aorta (HCC)    ??? Diabetes (HCC) 01/24/2013   ??? GERD (gastroesophageal reflux disease)    ??? Gout    ??? Hives    ??? HTN (hypertension)    ??? Hypercholesterolemia    ??? Perforated gastric ulcer (HCC)    ??? Postprocedural intraabdominal abscess      Past Surgical History:   Procedure Laterality Date   ??? ABDOMEN SURGERY PROC UNLISTED  05/12/2017    Lap exploratory perforated posterior ulcer repair by Dr. Amada Jupiter    ??? HX HYSTERECTOMY      ??? HX OTHER SURGICAL  05/13/2017    PROCEDURES PERFORMED:  Exploratory laparotomy, repair of perforated posterior ulcer with oversew and onlay omental patch-[facility]-DR. S. Shindel   ??? HX OTHER SURGICAL  08/09/2017     Abdominal drain replacement with irrigation of abdominal fistula tract-[facility]-Dr. Hulen Luster     Family History   Problem Relation Age of Onset   ??? Hypertension Mother    ??? Cancer Father         mesothelioma   ??? Diabetes Father    ??? Hypertension Father      Social History     Tobacco Use   ??? Smoking status: Former Smoker     Packs/day: 1.00     Last attempt to quit: 06/27/2011     Years since quitting: 7.0   ??? Smokeless tobacco: Never Used   Substance Use Topics   ??? Alcohol use: Yes     Alcohol/week: 1.0 - 2.0 standard drinks     Types: 1 - 2 Glasses of wine per week  Comment: Per month       The patient does not have a history of falls. A plan of care for falls was documented..  Depression screen positive, patient instructed to schedule follow-up visit at this practice.      Objective:  Visit Vitals  BP 120/80   Temp 98.5 ??F (36.9 ??C)   Ht 5' 3.5" (1.613 m)   Wt 232 lb (105.2 kg)   BMI 40.45 kg/m??     Obese 62 yo wf  In NAD. A&O.  HEENT -- Pupils round.  O/P erythematous  Tm- intact  Neck -- Supple. No JVD.no acn  Heart -- RRR. No R/M/G.  Lungs -- CTA.  Abdomen -- Soft. Non-tender. Non-distended. No masses. Bowel sounds present.  Extremities -- No edema.      Results for orders placed or performed in visit on 07/28/18   AMB POC COMPLETE CBC,AUTOMATED ENTER   Result Value Ref Range    WBC (POC) 10.1 4.5 - 10.5 K/uL    LYMPHOCYTES (POC) 35.6 20.5 - 51.1 %    MONOCYTES (POC) 7.3 1.7 - 9.3 %    GRANULOCYTES (POC) 57.1 42.2 - 75.2 %    ABS. LYMPHS (POC) 3.6 (A) 1.2 - 3.4 K/uL    ABS. MONOS (POC) 0.7 (A) 0.1 - 0.6 10^3/ul    ABS. GRANS (POC) 5.8 1.4 - 6.5 10^3/ul    RBC (POC) 4.93 4.00 - 6.00 M/uL    HGB (POC) 14.4 11 - 18 g/dL    HCT (POC) 21.345.1 08.635.0 - 60.0 %    MCV (POC) 91.5 80.0 - 99.9 fL     MCH (POC) 29.2 27.0 - 31.0 pg    MCHC (POC) 31.9 (A) 33.0 - 37.0 g/dL    RDW (POC) 57.815.5 (A) 46.911.6 - 13.7 %    PLATELET (POC) 230 150 - 450 K/uL    MPV (POC) 8.1 7.8 - 11 fL         Assessment/Plan:    ICD-10-CM ICD-9-CM    1. Acute non-recurrent maxillary sinusitis J01.00 461.0 AMB POC COMPLETE CBC,AUTOMATED ENTER      azithromycin (ZITHROMAX) 250 mg tablet   2. Hypertension complicating diabetes (HCC) E11.59 250.80     I10 401.9    3. Atherosclerosis of abdominal aorta (HCC) I70.0 440.0    4. Type 2 diabetes mellitus with hyperglycemia, with long-term current use of insulin (HCC) E11.65 250.00     Z79.4 790.29      V58.67    5. Morbid obesity due to excess calories (HCC) E66.01 278.01      Mucinex, warm salt water gargles      Author:  Lake BellsNancy J Egon Dittus, MD 07/28/2018 3:24 PM

## 2018-07-28 NOTE — Progress Notes (Signed)
Dana [name] is a 62 y.o. female and presents with   Chief Complaint   Patient presents with   ??? Sinus Infection   .  Has had sore throat x 3 days.  Then cough- productive ?? + fever and sweats.   No body aches.      Current Outpatient Medications   Medication Sig Dispense Refill   ??? azithromycin (ZITHROMAX) 250 mg tablet Take 1 Tab by mouth See Admin Instructions for 5 days. 6 Tab 0   ??? triamterene-hydroCHLOROthiazide (MAXZIDE) 37.5-25 mg per tablet TAKE 1 TABLET DAILY 90 Tab 4   ??? hydrOXYzine HCl (ATARAX) 50 mg tablet Take 50 mg by mouth three (3) times daily as needed for Itching.     ??? melatonin 5 mg cap capsule Take 5 mg by mouth nightly.     ??? amLODIPine (NORVASC) 5 mg tablet TAKE 1 TABLET BY MOUTH EVERY DAY 30 Tab 1   ??? pantoprazole (PROTONIX) 40 mg tablet TAKE 1 TABLET BY MOUTH TWICE A DAY 180 Tab 1   ??? simvastatin (ZOCOR) 40 mg tablet TAKE 1 TABLET NIGHTLY 90 Tab 1   ??? insulin NPH (HUMULIN N NPH INSULIN KWIKPEN) 100 unit/mL (3 mL) inpn INJECT 32 UNITS SUBCUTANEOUSLY AT BEDTIME 45 Adjustable Dose Pre-filled Pen Syringe 0   ??? metFORMIN ER (GLUCOPHAGE XR) 500 mg tablet TAKE 1 TABLET EVERY MORNING AND 2 TABLETS WITH DINNER 270 Tab 2   ??? sertraline (ZOLOFT) 100 mg tablet TAKE 1 TABLET BY MOUTH DAILY 90 Tab 2   ??? colchicine 0.6 mg tablet Take 1 Tab by mouth two (2) times a day. 20 Tab 3     Allergies   Allergen Reactions   ??? Adhesive Itching   ??? Dolobid [Diflunisal] Itching     Itching,swelling     Past Medical History:   Diagnosis Date   ??? Atherosclerosis of abdominal aorta (HCC)    ??? Diabetes (HCC) 01/24/2013   ??? GERD (gastroesophageal reflux disease)    ??? Gout    ??? Hives    ??? HTN (hypertension)    ??? Hypercholesterolemia    ??? Perforated gastric ulcer (HCC)    ??? Postprocedural intraabdominal abscess      Past Surgical History:   Procedure Laterality Date   ??? ABDOMEN SURGERY PROC UNLISTED  05/12/2017    Lap exploratory perforated posterior ulcer repair by Dr. Amada JupiterShindel    ??? HX HYSTERECTOMY     ??? HX OTHER SURGICAL   05/13/2017    PROCEDURES PERFORMED:  Exploratory laparotomy, repair of perforated posterior ulcer with oversew and onlay omental patch-[facility]-DR. S. Shindel   ??? HX OTHER SURGICAL  08/09/2017     Abdominal drain replacement with irrigation of abdominal fistula tract-[facility]-Dr. Hulen LusterS. Suy     Family History   Problem Relation Age of Onset   ??? Hypertension Mother    ??? Cancer Father         mesothelioma   ??? Diabetes Father    ??? Hypertension Father      Social History     Tobacco Use   ??? Smoking status: Former Smoker     Packs/day: 1.00     Last attempt to quit: 06/27/2011     Years since quitting: 7.0   ??? Smokeless tobacco: Never Used   Substance Use Topics   ??? Alcohol use: Yes     Alcohol/week: 1.0 - 2.0 standard drinks     Types: 1 - 2 Glasses of wine per week  Comment: Per month       The patient does not have a history of falls. A plan of care for falls was documented..  Depression screen positive, patient instructed to schedule follow-up visit at this practice.      Objective:  Visit Vitals  BP 120/80   Temp 98.5 ??F (36.9 ??C)   Ht 5' 3.5" (1.613 m)   Wt 232 lb (105.2 kg)   BMI 40.45 kg/m??     Obese 62 yo wf  In NAD. A&O.  HEENT -- Pupils round.  O/P erythematous  Tm- intact  Neck -- Supple. No JVD.no acn  Heart -- RRR. No R/M/G.  Lungs -- CTA.  Abdomen -- Soft. Non-tender. Non-distended. No masses. Bowel sounds present.  Extremities -- No edema.      Results for orders placed or performed in visit on 07/28/18   AMB POC COMPLETE CBC,AUTOMATED ENTER   Result Value Ref Range    WBC (POC) 10.1 4.5 - 10.5 K/uL    LYMPHOCYTES (POC) 35.6 20.5 - 51.1 %    MONOCYTES (POC) 7.3 1.7 - 9.3 %    GRANULOCYTES (POC) 57.1 42.2 - 75.2 %    ABS. LYMPHS (POC) 3.6 (A) 1.2 - 3.4 K/uL    ABS. MONOS (POC) 0.7 (A) 0.1 - 0.6 10^3/ul    ABS. GRANS (POC) 5.8 1.4 - 6.5 10^3/ul    RBC (POC) 4.93 4.00 - 6.00 M/uL    HGB (POC) 14.4 11 - 18 g/dL    HCT (POC) 16.1 09.6 - 60.0 %    MCV (POC) 91.5 80.0 - 99.9 fL    MCH (POC) 29.2 27.0 - 31.0 pg    MCHC  (POC) 31.9 (A) 33.0 - 37.0 g/dL    RDW (POC) 04.5 (A) 40.9 - 13.7 %    PLATELET (POC) 230 150 - 450 K/uL    MPV (POC) 8.1 7.8 - 11 fL         Assessment/Plan:    ICD-10-CM ICD-9-CM    1. Acute non-recurrent maxillary sinusitis J01.00 461.0 AMB POC COMPLETE CBC,AUTOMATED ENTER      azithromycin (ZITHROMAX) 250 mg tablet   2. Hypertension complicating diabetes (HCC) E11.59 250.80     I10 401.9    3. Atherosclerosis of abdominal aorta (HCC) I70.0 440.0    4. Type 2 diabetes mellitus with hyperglycemia, with long-term current use of insulin (HCC) E11.65 250.00     Z79.4 790.29      V58.67    5. Morbid obesity due to excess calories (HCC) E66.01 278.01      Mucinex, warm salt water gargles      Author:  Lake Bells, MD 07/28/2018 3:24 PM

## 2018-09-18 ENCOUNTER — Encounter: Attending: Internal Medicine | Primary: Internal Medicine

## 2018-09-18 NOTE — Progress Notes (Unsigned)
Dana [name] is a 63 y.o. female and presents with No chief complaint on file.  .  ***      Current Outpatient Medications   Medication Sig Dispense Refill   ??? triamterene-hydroCHLOROthiazide (MAXZIDE) 37.5-25 mg per tablet TAKE 1 TABLET DAILY 90 Tab 4   ??? hydrOXYzine HCl (ATARAX) 50 mg tablet Take 50 mg by mouth three (3) times daily as needed for Itching.     ??? melatonin 5 mg cap capsule Take 5 mg by mouth nightly.     ??? amLODIPine (NORVASC) 5 mg tablet TAKE 1 TABLET BY MOUTH EVERY DAY 30 Tab 1   ??? pantoprazole (PROTONIX) 40 mg tablet TAKE 1 TABLET BY MOUTH TWICE A DAY 180 Tab 1   ??? simvastatin (ZOCOR) 40 mg tablet TAKE 1 TABLET NIGHTLY 90 Tab 1   ??? insulin NPH (HUMULIN N NPH INSULIN KWIKPEN) 100 unit/mL (3 mL) inpn INJECT 32 UNITS SUBCUTANEOUSLY AT BEDTIME 45 Adjustable Dose Pre-filled Pen Syringe 0   ??? metFORMIN ER (GLUCOPHAGE XR) 500 mg tablet TAKE 1 TABLET EVERY MORNING AND 2 TABLETS WITH DINNER 270 Tab 2   ??? sertraline (ZOLOFT) 100 mg tablet TAKE 1 TABLET BY MOUTH DAILY 90 Tab 2   ??? colchicine 0.6 mg tablet Take 1 Tab by mouth two (2) times a day. 20 Tab 3     Allergies   Allergen Reactions   ??? Adhesive Itching   ??? Dolobid [Diflunisal] Itching     Itching,swelling     Past Medical History:   Diagnosis Date   ??? Atherosclerosis of abdominal aorta (HCC)    ??? Diabetes (HCC) 01/24/2013   ??? GERD (gastroesophageal reflux disease)    ??? Gout    ??? Hives    ??? HTN (hypertension)    ??? Hypercholesterolemia    ??? Perforated gastric ulcer (HCC)    ??? Postprocedural intraabdominal abscess      Past Surgical History:   Procedure Laterality Date   ??? ABDOMEN SURGERY PROC UNLISTED  05/12/2017    Lap exploratory perforated posterior ulcer repair by Dr. Amada Jupiter    ??? HX HYSTERECTOMY     ??? HX OTHER SURGICAL  05/13/2017    PROCEDURES PERFORMED:  Exploratory laparotomy, repair of perforated posterior ulcer with oversew and onlay omental patch-[facility]-DR. S. Shindel   ??? HX OTHER SURGICAL  08/09/2017      Abdominal drain replacement with irrigation of abdominal fistula tract-[facility]-Dr. Hulen Luster     Family History   Problem Relation Age of Onset   ??? Hypertension Mother    ??? Cancer Father         mesothelioma   ??? Diabetes Father    ??? Hypertension Father      Social History     Tobacco Use   ??? Smoking status: Former Smoker     Packs/day: 1.00     Last attempt to quit: 06/27/2011     Years since quitting: 7.2   ??? Smokeless tobacco: Never Used   Substance Use Topics   ??? Alcohol use: Yes     Alcohol/week: 1.0 - 2.0 standard drinks     Types: 1 - 2 Glasses of wine per week     Comment: Per month       The patient {QM PQRS Fall Risk PQRS 154 & 620:[id number]} a history of falls. A plan of care for falls {QM PQRS [facility] of Care PQRS [id number]}.  Depression screen positive, {Depression Follow-Up:[id number]}.      Objective:  There were no vitals taken for this visit.    In NAD. A&O.  HEENT -- Pupils round.  O/P Clear.  Neck -- Supple. No JVD.  Heart -- RRR. No R/M/G.  Lungs -- CTA.  Abdomen -- Soft. Non-tender. Non-distended. No masses. Bowel sounds present.  Extremeties -- No edema.        Assessment/Plan:  {No Diagnosis Found}        Author:  Lake Bells, MD 09/15/2018 5:19 PM

## 2018-10-04 MED ORDER — SERTRALINE 100 MG TAB
100 mg | ORAL_TABLET | ORAL | 2 refills | Status: DC
Start: 2018-10-04 — End: 2018-12-14

## 2018-10-17 MED ORDER — SIMVASTATIN 40 MG TAB
40 mg | ORAL_TABLET | ORAL | 4 refills | Status: DC
Start: 2018-10-17 — End: 2018-12-14

## 2018-10-17 NOTE — Telephone Encounter (Signed)
pls call- patient due for follow up

## 2018-10-26 ENCOUNTER — Ambulatory Visit: Admit: 2018-10-26 | Attending: Internal Medicine | Primary: Internal Medicine

## 2018-10-26 ENCOUNTER — Ambulatory Visit: Attending: Internal Medicine | Primary: Internal Medicine

## 2018-10-26 DIAGNOSIS — E1159 Type 2 diabetes mellitus with other circulatory complications: Secondary | ICD-10-CM

## 2018-10-26 LAB — AMB POC URINALYSIS DIP STICK MANUAL W/O MICRO
Bilirubin (UA POC): NEGATIVE
Bilirubin, Urine, POC: NEGATIVE
Glucose (UA POC): NEGATIVE
Glucose, Urine, POC: NEGATIVE
Ketones (UA POC): NEGATIVE
Ketones, Urine, POC: NEGATIVE
Leukocyte Esterase, Urine, POC: NEGATIVE
Leukocyte esterase (UA POC): NEGATIVE
Nitrite, Urine, POC: NEGATIVE
Nitrites (UA POC): NEGATIVE
Protein (UA POC): NEGATIVE
Protein, Urine, POC: NEGATIVE
Specific Gravity, Urine, POC: 1.02 NA (ref 1.001–1.035)
Specific gravity (UA POC): 1.02 (ref 1.001–1.035)
Urobilinogen (UA POC): 0.2 (ref 0.2–1)
Urobilinogen, POC: 0.2 (ref 0.2–1)
pH (UA POC): 6 (ref 4.6–8.0)
pH, Urine, POC: 6 NA (ref 4.6–8.0)

## 2018-10-26 LAB — AMB POC LIPID PROFILE
Cholesterol (POC): 147 mg/dL (ref 100–199)
Cholesterol, POC: 147 mg/dL (ref 100–199)
HDL Cholesterol (POC): 40 mg/dL (ref 35–150)
HDL Cholesterol, POC: 40 mg/dL (ref 35–150)
LDL Cholesterol (POC): 88 mg/dL (ref 0–129)
LDL Cholesterol, POC: 88 mg/dL (ref 0–129)
Non-HDL Goal (POC): 106
Non-HDL Goal, POC: 106 NA
TChol/HDL Ratio (POC): 3.6 CALC (ref 0.0–5.0)
TChol/HDL Ratio (POC): 3.6 CALC (ref 0.0–5.0)
Triglycerides (POC): 90 mg/dL (ref 0–150)
Triglycerides, POC: 90 mg/dL (ref 0–150)

## 2018-10-26 LAB — AMB POC HEMOGLOBIN A1C
Hemoglobin A1C, POC: 7.2 % — AB (ref 4.8–5.6)
Hemoglobin A1c (POC): 7.2 % — AB (ref 4.8–5.6)

## 2018-10-26 NOTE — Patient Instructions (Signed)
Results for orders placed or performed in visit on 10/26/18   AMB POC URINALYSIS DIP STICK MANUAL W/O MICRO   Result Value Ref Range    Color (UA POC) Yellow     Clarity (UA POC) Clear     Glucose (UA POC) Negative Negative    Bilirubin (UA POC) Negative Negative    Ketones (UA POC) Negative Negative    Specific gravity (UA POC) 1.020 1.001 - 1.035    Blood (UA POC) Trace Negative    pH (UA POC) 6.0 4.6 - 8.0    Protein (UA POC) Negative Negative    Urobilinogen (UA POC) 0.2 mg/dL 0.2 - 1    Nitrites (UA POC) Negative Negative    Leukocyte esterase (UA POC) Negative Negative   AMB POC HEMOGLOBIN A1C   Result Value Ref Range    Hemoglobin A1c (POC) 7.2 (A) 4.8 - 5.6 %   AMB POC LIPID PROFILE   Result Value Ref Range    Cholesterol (POC) 147 100 - 199 mg/dL    Triglycerides (POC) 90 0 - 150 mg/dL    HDL Cholesterol (POC) 40 35 - 150 mg/dL    LDL Cholesterol (POC) 88 0 - 129 mg/dL    Non-HDL Goal (POC) 753     TChol/HDL Ratio (POC) 3.6 0.0 - 5.0 CALC

## 2018-10-26 NOTE — Progress Notes (Signed)
Dana [name] is a 63 y.o. female and presents with   Chief Complaint   Patient presents with   ??? Follow-up   .  Patient in the process of moving  . So stress eating.  Has gained some weight. No chest pain or sob.  Has a lot of joint inflammation.  Hurts a lot.- tylenol does not help much.      Current Outpatient Medications   Medication Sig Dispense Refill   ??? simvastatin (ZOCOR) 40 mg tablet TAKE 1 TABLET NIGHTLY 90 Tab 4   ??? sertraline (ZOLOFT) 100 mg tablet TAKE 1 TABLET BY MOUTH DAILY 90 Tab 2   ??? triamterene-hydroCHLOROthiazide (MAXZIDE) 37.5-25 mg per tablet TAKE 1 TABLET DAILY 90 Tab 4   ??? hydrOXYzine HCl (ATARAX) 50 mg tablet Take 50 mg by mouth three (3) times daily as needed for Itching.     ??? melatonin 5 mg cap capsule Take 5 mg by mouth nightly.     ??? amLODIPine (NORVASC) 5 mg tablet TAKE 1 TABLET BY MOUTH EVERY DAY 30 Tab 1   ??? pantoprazole (PROTONIX) 40 mg tablet TAKE 1 TABLET BY MOUTH TWICE A DAY 180 Tab 1   ??? insulin NPH (HUMULIN N NPH INSULIN KWIKPEN) 100 unit/mL (3 mL) inpn INJECT 32 UNITS SUBCUTANEOUSLY AT BEDTIME 45 Adjustable Dose Pre-filled Pen Syringe 0   ??? metFORMIN ER (GLUCOPHAGE XR) 500 mg tablet TAKE 1 TABLET EVERY MORNING AND 2 TABLETS WITH DINNER 270 Tab 2   ??? colchicine 0.6 mg tablet Take 1 Tab by mouth two (2) times a day. 20 Tab 3     Allergies   Allergen Reactions   ??? Adhesive Itching   ??? Dolobid [Diflunisal] Itching     Itching,swelling     Past Medical History:   Diagnosis Date   ??? Atherosclerosis of abdominal aorta (HCC)    ??? Diabetes (HCC) 01/24/2013   ??? GERD (gastroesophageal reflux disease)    ??? Gout    ??? Hives    ??? HTN (hypertension)    ??? Hypercholesterolemia    ??? Perforated gastric ulcer (HCC)    ??? Postprocedural intraabdominal abscess      Past Surgical History:   Procedure Laterality Date   ??? ABDOMEN SURGERY PROC UNLISTED  05/12/2017    Lap exploratory perforated posterior ulcer repair by Dr. Amada Jupiter    ??? HX HYSTERECTOMY     ??? HX OTHER SURGICAL  05/13/2017     PROCEDURES PERFORMED:  Exploratory laparotomy, repair of perforated posterior ulcer with oversew and onlay omental patch-[facility]-DR. S. Shindel   ??? HX OTHER SURGICAL  08/09/2017     Abdominal drain replacement with irrigation of abdominal fistula tract-[facility]-Dr. Hulen Luster     Family History   Problem Relation Age of Onset   ??? Hypertension Mother    ??? Cancer Father         mesothelioma   ??? Diabetes Father    ??? Hypertension Father      Social History     Tobacco Use   ??? Smoking status: Former Smoker     Packs/day: 1.00     Last attempt to quit: 06/27/2011     Years since quitting: 7.3   ??? Smokeless tobacco: Never Used   Substance Use Topics   ??? Alcohol use: Yes     Alcohol/week: 1.0 - 2.0 standard drinks     Types: 1 - 2 Glasses of wine per week     Comment: Per month  The patient does not have a history of falls. A plan of care for falls was documented..  Depression screen positive, treated.      Objective:  Visit Vitals  BP 120/70   Ht 5' 3.5" (1.613 m)   Wt 240 lb (108.9 kg)   BMI 41.85 kg/m??     Obese 63 yo wf  In NAD. A&O.  HEENT -- Pupils round.  O/P Clear.  Neck -- Supple. No JVD.  Heart -- RRR. No R/M/G.  Lungs -- CTA.  Abdomen -- Soft. Non-tender. Non-distended. No masses. Bowel sounds present.  Extremities -- No edema. Good pulses, good sensation, no lesions      Results for orders placed or performed in visit on 10/26/18   AMB POC URINALYSIS DIP STICK MANUAL W/O MICRO   Result Value Ref Range    Color (UA POC) Yellow     Clarity (UA POC) Clear     Glucose (UA POC) Negative Negative    Bilirubin (UA POC) Negative Negative    Ketones (UA POC) Negative Negative    Specific gravity (UA POC) 1.020 1.001 - 1.035    Blood (UA POC) Trace Negative    pH (UA POC) 6.0 4.6 - 8.0    Protein (UA POC) Negative Negative    Urobilinogen (UA POC) 0.2 mg/dL 0.2 - 1    Nitrites (UA POC) Negative Negative    Leukocyte esterase (UA POC) Negative Negative   AMB POC HEMOGLOBIN A1C   Result Value Ref Range     Hemoglobin A1c (POC) 7.2 (A) 4.8 - 5.6 %   AMB POC LIPID PROFILE   Result Value Ref Range    Cholesterol (POC) 147 100 - 199 mg/dL    Triglycerides (POC) 90 0 - 150 mg/dL    HDL Cholesterol (POC) 40 35 - 150 mg/dL    LDL Cholesterol (POC) 88 0 - 129 mg/dL    Non-HDL Goal (POC) 417     TChol/HDL Ratio (POC) 3.6 0.0 - 5.0 CALC       Assessment/Plan:    ICD-10-CM ICD-9-CM    1. Hypertension complicating diabetes (HCC) E11.59 250.80     I10 401.9    2. Atherosclerosis of abdominal aorta (HCC) I70.0 440.0    3. Acute gastric ulcer with perforation (HCC) K25.1 531.10    4. Hives L50.9 708.9    5. Obstructive sleep apnea syndrome G47.33 327.23    6. Morbid obesity due to excess calories (HCC) E66.01 278.01        Follow up  3 months    Author:  Lake Bells, MD 10/26/2018 7:51 AM

## 2018-10-26 NOTE — Progress Notes (Signed)
pls call- labs look good.  Continue to work on diet and exercise since blood sugars creeping up.

## 2018-10-26 NOTE — Progress Notes (Signed)
Called patient

## 2018-10-26 NOTE — Progress Notes (Signed)
pls call- labs look good.  Continue to work on diet and exercise since blood sugars creeping up.

## 2018-10-26 NOTE — Progress Notes (Signed)
Dana [name] is a 63 y.o. female and presents with   Chief Complaint   Patient presents with   ??? Follow-up   .  Patient in the process of moving  . So stress eating.  Has gained some weight. No chest pain or sob.  Has a lot of joint inflammation.  Hurts a lot.- tylenol does not help much.      Current Outpatient Medications   Medication Sig Dispense Refill   ??? simvastatin (ZOCOR) 40 mg tablet TAKE 1 TABLET NIGHTLY 90 Tab 4   ??? sertraline (ZOLOFT) 100 mg tablet TAKE 1 TABLET BY MOUTH DAILY 90 Tab 2   ??? triamterene-hydroCHLOROthiazide (MAXZIDE) 37.5-25 mg per tablet TAKE 1 TABLET DAILY 90 Tab 4   ??? hydrOXYzine HCl (ATARAX) 50 mg tablet Take 50 mg by mouth three (3) times daily as needed for Itching.     ??? melatonin 5 mg cap capsule Take 5 mg by mouth nightly.     ??? amLODIPine (NORVASC) 5 mg tablet TAKE 1 TABLET BY MOUTH EVERY DAY 30 Tab 1   ??? pantoprazole (PROTONIX) 40 mg tablet TAKE 1 TABLET BY MOUTH TWICE A DAY 180 Tab 1   ??? insulin NPH (HUMULIN N NPH INSULIN KWIKPEN) 100 unit/mL (3 mL) inpn INJECT 32 UNITS SUBCUTANEOUSLY AT BEDTIME 45 Adjustable Dose Pre-filled Pen Syringe 0   ??? metFORMIN ER (GLUCOPHAGE XR) 500 mg tablet TAKE 1 TABLET EVERY MORNING AND 2 TABLETS WITH DINNER 270 Tab 2   ??? colchicine 0.6 mg tablet Take 1 Tab by mouth two (2) times a day. 20 Tab 3     Allergies   Allergen Reactions   ??? Adhesive Itching   ??? Dolobid [Diflunisal] Itching     Itching,swelling     Past Medical History:   Diagnosis Date   ??? Atherosclerosis of abdominal aorta (HCC)    ??? Diabetes (HCC) 01/24/2013   ??? GERD (gastroesophageal reflux disease)    ??? Gout    ??? Hives    ??? HTN (hypertension)    ??? Hypercholesterolemia    ??? Perforated gastric ulcer (HCC)    ??? Postprocedural intraabdominal abscess      Past Surgical History:   Procedure Laterality Date   ??? ABDOMEN SURGERY PROC UNLISTED  05/12/2017    Lap exploratory perforated posterior ulcer repair by Dr. Amada Jupiter    ??? HX HYSTERECTOMY     ??? HX OTHER SURGICAL  05/13/2017    PROCEDURES  PERFORMED:  Exploratory laparotomy, repair of perforated posterior ulcer with oversew and onlay omental patch-[facility]-DR. S. Shindel   ??? HX OTHER SURGICAL  08/09/2017     Abdominal drain replacement with irrigation of abdominal fistula tract-[facility]-Dr. Hulen Luster     Family History   Problem Relation Age of Onset   ??? Hypertension Mother    ??? Cancer Father         mesothelioma   ??? Diabetes Father    ??? Hypertension Father      Social History     Tobacco Use   ??? Smoking status: Former Smoker     Packs/day: 1.00     Last attempt to quit: 06/27/2011     Years since quitting: 7.3   ??? Smokeless tobacco: Never Used   Substance Use Topics   ??? Alcohol use: Yes     Alcohol/week: 1.0 - 2.0 standard drinks     Types: 1 - 2 Glasses of wine per week     Comment: Per month  The patient does not have a history of falls. A plan of care for falls was documented..  Depression screen positive, treated.      Objective:  Visit Vitals  BP 120/70   Ht 5' 3.5" (1.613 m)   Wt 240 lb (108.9 kg)   BMI 41.85 kg/m??     Obese 63 yo wf  In NAD. A&O.  HEENT -- Pupils round.  O/P Clear.  Neck -- Supple. No JVD.  Heart -- RRR. No R/M/G.  Lungs -- CTA.  Abdomen -- Soft. Non-tender. Non-distended. No masses. Bowel sounds present.  Extremities -- No edema. Good pulses, good sensation, no lesions      Results for orders placed or performed in visit on 10/26/18   AMB POC URINALYSIS DIP STICK MANUAL W/O MICRO   Result Value Ref Range    Color (UA POC) Yellow     Clarity (UA POC) Clear     Glucose (UA POC) Negative Negative    Bilirubin (UA POC) Negative Negative    Ketones (UA POC) Negative Negative    Specific gravity (UA POC) 1.020 1.001 - 1.035    Blood (UA POC) Trace Negative    pH (UA POC) 6.0 4.6 - 8.0    Protein (UA POC) Negative Negative    Urobilinogen (UA POC) 0.2 mg/dL 0.2 - 1    Nitrites (UA POC) Negative Negative    Leukocyte esterase (UA POC) Negative Negative   AMB POC HEMOGLOBIN A1C   Result Value Ref Range    Hemoglobin A1c (POC) 7.2 (A) 4.8 -  5.6 %   AMB POC LIPID PROFILE   Result Value Ref Range    Cholesterol (POC) 147 100 - 199 mg/dL    Triglycerides (POC) 90 0 - 150 mg/dL    HDL Cholesterol (POC) 40 35 - 150 mg/dL    LDL Cholesterol (POC) 88 0 - 129 mg/dL    Non-HDL Goal (POC) 341     TChol/HDL Ratio (POC) 3.6 0.0 - 5.0 CALC       Assessment/Plan:    ICD-10-CM ICD-9-CM    1. Hypertension complicating diabetes (HCC) E11.59 250.80     I10 401.9    2. Atherosclerosis of abdominal aorta (HCC) I70.0 440.0    3. Acute gastric ulcer with perforation (HCC) K25.1 531.10    4. Hives L50.9 708.9    5. Obstructive sleep apnea syndrome G47.33 327.23    6. Morbid obesity due to excess calories (HCC) E66.01 278.01        Follow up  3 months    Author:  Lake Bells, MD 10/26/2018 7:51 AM

## 2018-10-29 MED ORDER — AMLODIPINE 5 MG TAB
5 mg | ORAL_TABLET | ORAL | 1 refills | Status: DC
Start: 2018-10-29 — End: 2018-11-20

## 2018-10-31 ENCOUNTER — Encounter

## 2018-10-31 LAB — METABOLIC PANEL, COMPREHENSIVE
A-G Ratio: 1.7 (ref 1.2–2.2)
ALT (SGPT): 16 IU/L (ref 0–32)
AST (SGOT): 15 IU/L (ref 0–40)
Albumin: 3.8 g/dL (ref 3.8–4.8)
Alk. phosphatase: 58 IU/L (ref 39–117)
BUN/Creatinine ratio: 20 (ref 12–28)
BUN: 15 mg/dL (ref 8–27)
Bilirubin, total: 0.4 mg/dL (ref 0.0–1.2)
CO2: 23 mmol/L (ref 20–29)
Calcium: 9 mg/dL (ref 8.7–10.3)
Chloride: 106 mmol/L (ref 96–106)
Creatinine: 0.75 mg/dL (ref 0.57–1.00)
GFR est AA: 99 mL/min/{1.73_m2} (ref >59)
GFR est non-AA: 86 mL/min/{1.73_m2} (ref >59)
GLOBULIN, TOTAL: 2.3 g/dL (ref 1.5–4.5)
Glucose: 145 mg/dL — ABNORMAL HIGH (ref 65–99)
Potassium: 4.5 mmol/L (ref 3.5–5.2)
Protein, total: 6.1 g/dL (ref 6.0–8.5)
Sodium: 143 mmol/L (ref 134–144)

## 2018-10-31 LAB — ALPHA-GAL, IGE: ALPHA GAL IGE*: <0.1 kU/L (ref <0.10)

## 2018-10-31 LAB — COMPREHENSIVE METABOLIC PANEL
ALT: 16 IU/L (ref 0–32)
AST: 15 IU/L (ref 0–40)
Albumin/Globulin Ratio: 1.7 NA (ref 1.2–2.2)
Albumin: 3.8 g/dL (ref 3.8–4.8)
Alkaline Phosphatase: 58 IU/L (ref 39–117)
BUN: 15 mg/dL (ref 8–27)
Bun/Cre Ratio: 20 NA (ref 12–28)
CO2: 23 mmol/L (ref 20–29)
Calcium: 9 mg/dL (ref 8.7–10.3)
Chloride: 106 mmol/L (ref 96–106)
Creatinine: 0.75 mg/dL (ref 0.57–1.00)
EGFR IF NonAfrican American: 86 mL/min/{1.73_m2} (ref >59)
GFR African American: 99 mL/min/{1.73_m2} (ref >59)
Globulin, Total: 2.3 g/dL (ref 1.5–4.5)
Glucose: 145 mg/dL — ABNORMAL HIGH (ref 65–99)
Potassium: 4.5 mmol/L (ref 3.5–5.2)
Sodium: 143 mmol/L (ref 134–144)
Total Bilirubin: 0.4 mg/dL (ref 0.0–1.2)
Total Protein: 6.1 g/dL (ref 6.0–8.5)

## 2018-10-31 MED ORDER — INSULIN LISPRO 100 UNIT/ML (3 ML) SUB-Q PEN
100 unit/mL | PACK | SUBCUTANEOUS | 1 refills | Status: DC
Start: 2018-10-31 — End: 2018-11-23

## 2018-11-10 MED ORDER — FREESTYLE LIBRE 14 DAY SENSOR KIT
PACK | 0 refills | Status: DC
Start: 2018-11-10 — End: 2018-11-20

## 2018-11-14 MED ORDER — PANTOPRAZOLE 40 MG TAB, DELAYED RELEASE
40 mg | ORAL_TABLET | ORAL | 1 refills | Status: DC
Start: 2018-11-14 — End: 2018-11-21

## 2018-11-16 ENCOUNTER — Encounter

## 2018-11-16 MED ORDER — OMEPRAZOLE 40 MG CAP, DELAYED RELEASE
40 mg | ORAL_CAPSULE | Freq: Two times a day (BID) | ORAL | 2 refills | Status: AC
Start: 2018-11-16 — End: ?

## 2018-11-20 MED ORDER — AMLODIPINE 5 MG TAB
5 mg | ORAL_TABLET | ORAL | 1 refills | Status: DC
Start: 2018-11-20 — End: 2018-12-14

## 2018-11-21 ENCOUNTER — Encounter

## 2018-11-21 MED ORDER — PANTOPRAZOLE 40 MG TAB, DELAYED RELEASE
40 mg | ORAL_TABLET | ORAL | 1 refills | Status: DC
Start: 2018-11-21 — End: 2018-12-14

## 2018-11-21 MED ORDER — FREESTYLE LIBRE 14 DAY SENSOR KIT
PACK | 0 refills | Status: DC
Start: 2018-11-21 — End: 2018-12-14

## 2018-11-21 NOTE — Telephone Encounter (Signed)
ordered

## 2018-11-23 ENCOUNTER — Encounter

## 2018-11-23 MED ORDER — METFORMIN SR 500 MG 24 HR TABLET
500 mg | ORAL_TABLET | ORAL | 3 refills | Status: DC
Start: 2018-11-23 — End: 2018-12-14

## 2018-11-23 MED ORDER — INSULIN LISPRO 100 UNIT/ML (3 ML) SUB-Q PEN
100 unit/mL | PACK | SUBCUTANEOUS | 1 refills | Status: AC
Start: 2018-11-23 — End: ?

## 2018-12-07 MED ORDER — TRIAMTERENE 50 MG CAP
50 mg | ORAL_CAPSULE | Freq: Every day | ORAL | 0 refills | Status: DC
Start: 2018-12-07 — End: 2019-03-04

## 2018-12-14 ENCOUNTER — Encounter

## 2018-12-14 MED ORDER — COLCHICINE 0.6 MG TAB
0.6 mg | ORAL_TABLET | Freq: Two times a day (BID) | ORAL | 0 refills | Status: DC
Start: 2018-12-14 — End: 2019-01-24

## 2018-12-14 MED ORDER — AMLODIPINE 5 MG TAB
5 mg | ORAL_TABLET | ORAL | 0 refills | Status: DC
Start: 2018-12-14 — End: 2018-12-18

## 2018-12-14 MED ORDER — SERTRALINE 100 MG TAB
100 mg | ORAL_TABLET | ORAL | 0 refills | Status: AC
Start: 2018-12-14 — End: ?

## 2018-12-14 MED ORDER — TRIAMTERENE-HYDROCHLOROTHIAZIDE 37.5 MG-25 MG TAB
ORAL_TABLET | ORAL | 0 refills | Status: AC
Start: 2018-12-14 — End: ?

## 2018-12-14 MED ORDER — FREESTYLE LIBRE 14 DAY SENSOR KIT
PACK | 0 refills | Status: DC
Start: 2018-12-14 — End: 2018-12-26

## 2018-12-14 MED ORDER — HUMULIN N NPH U-100 INSULIN KWIKPEN 100 UNIT/ML (3 ML) SUBCUTANEOUS
100 unit/mL (3 mL) | SUBCUTANEOUS | 0 refills | Status: AC
Start: 2018-12-14 — End: ?

## 2018-12-14 MED ORDER — SIMVASTATIN 40 MG TAB
40 mg | ORAL_TABLET | ORAL | 0 refills | Status: AC
Start: 2018-12-14 — End: ?

## 2018-12-14 MED ORDER — METFORMIN SR 500 MG 24 HR TABLET
500 mg | ORAL_TABLET | ORAL | 0 refills | Status: DC
Start: 2018-12-14 — End: 2019-03-19

## 2018-12-14 MED ORDER — PANTOPRAZOLE 40 MG TAB, DELAYED RELEASE
40 mg | ORAL_TABLET | ORAL | 0 refills | Status: AC
Start: 2018-12-14 — End: ?

## 2018-12-18 ENCOUNTER — Encounter

## 2018-12-18 MED ORDER — AMLODIPINE 5 MG TAB
5 mg | ORAL_TABLET | ORAL | 1 refills | Status: DC
Start: 2018-12-18 — End: 2019-04-21

## 2018-12-26 ENCOUNTER — Encounter

## 2018-12-26 MED ORDER — FREESTYLE LIBRE 14 DAY SENSOR KIT
PACK | 1 refills | Status: DC
Start: 2018-12-26 — End: 2019-01-24

## 2019-01-24 ENCOUNTER — Encounter: Attending: Internal Medicine | Primary: Internal Medicine

## 2019-01-24 ENCOUNTER — Telehealth: Attending: Internal Medicine | Primary: Internal Medicine

## 2019-01-24 ENCOUNTER — Encounter

## 2019-01-24 DIAGNOSIS — K432 Incisional hernia without obstruction or gangrene: Secondary | ICD-10-CM

## 2019-01-24 MED ORDER — COLCHICINE 0.6 MG TAB
0.6 mg | ORAL_TABLET | Freq: Two times a day (BID) | ORAL | 0 refills | Status: AC
Start: 2019-01-24 — End: ?

## 2019-01-24 MED ORDER — FREESTYLE LIBRE 14 DAY SENSOR KIT
PACK | 1 refills | Status: AC
Start: 2019-01-24 — End: ?

## 2019-01-24 NOTE — Progress Notes (Signed)
Dana [name] is a 63 y.o. female and presents with   Chief Complaint   Patient presents with   ??? Medication Refill   ??? Abdominal Pain   .  Virtual visit with patient for med refill.  She needs refills on free style sensor and her colchicine.   She also has noticed bulging in her abdomen when she sits from prone position. Disappears when she sits up. Causes some discomfort when changing position but does not persist. There is no change in appetite, nausea, vomiting, change in bowel habits.      Current Outpatient Medications   Medication Sig Dispense Refill   ??? colchicine 0.6 mg tablet Take 1 Tab by mouth two (2) times a day. 20 Tab 0   ??? flash glucose sensor (FreeStyle Libre 14 Day Sensor) kit USE TO TEST BLOOD SUGAR EVERY 14 DAYS 6 Kit 1   ??? amLODIPine (NORVASC) 5 mg tablet TAKE 1 TABLET BY MOUTH EVERY DAY 30 Tab 1   ??? metFORMIN ER (GLUCOPHAGE XR) 500 mg tablet TAKE 1 TABLET EVERY MORNING AND 2 TABLETS WITH DINNER 270 Tab 0   ??? simvastatin (ZOCOR) 40 mg tablet TAKE 1 TABLET NIGHTLY 90 Tab 0   ??? triamterene-hydroCHLOROthiazide (MAXZIDE) 37.5-25 mg per tablet TAKE 1 TABLET DAILY 90 Tab 0   ??? insulin NPH (HumuLIN N NPH Insulin KwikPen) 100 unit/mL (3 mL) inpn INJECT 32 UNITS SUBCUTANEOUSLY AT BEDTIME  Indications: e11.9 45 Adjustable Dose Pre-filled Pen Syringe 0   ??? pantoprazole (PROTONIX) 40 mg tablet Take once daily 90 Tab 0   ??? sertraline (ZOLOFT) 100 mg tablet TAKE 1 TABLET BY MOUTH DAILY 90 Tab 0   ??? triamterene (DYRENIUM) 50 mg capsule Take 1 Cap by mouth daily. 90 Cap 0   ??? insulin lispro (HumaLOG KwikPen Insulin) 100 unit/mL kwikpen USE 2 UNITS FOR BLOOD SUGARS 150-200USE 4 UNITS FOR 201-250, 6 UNITS FOR 251-300. 8 UNITS FOR 301-350 AND 10 UNITS FOR >350. DX E11.65 1 Package 1   ??? omeprazole (PRILOSEC) 40 mg capsule Take 1 Cap by mouth two (2) times a day. 180 Cap 2   ??? hydrOXYzine HCl (ATARAX) 50 mg tablet Take 50 mg by mouth three (3) times daily as needed for Itching.      ??? melatonin 5 mg cap capsule Take 5 mg by mouth nightly.       Allergies   Allergen Reactions   ??? Adhesive Itching   ??? Dolobid [Diflunisal] Itching     Itching,swelling     Past Medical History:   Diagnosis Date   ??? Atherosclerosis of abdominal aorta (HCC)    ??? Diabetes ([place]) 01/24/2013   ??? GERD (gastroesophageal reflux disease)    ??? Gout    ??? Hives    ??? HTN (hypertension)    ??? Hypercholesterolemia    ??? Perforated gastric ulcer ([place])    ??? Postprocedural intraabdominal abscess      Past Surgical History:   Procedure Laterality Date   ??? ABDOMEN SURGERY PROC UNLISTED  05/12/2017    Lap exploratory perforated posterior ulcer repair by Dr. Claudie Leach    ??? HX HYSTERECTOMY     ??? HX OTHER SURGICAL  05/13/2017    PROCEDURES PERFORMED:  Exploratory laparotomy, repair of perforated posterior ulcer with oversew and onlay omental patch-[facility]-DR. S. Shindel   ??? HX OTHER SURGICAL  08/09/2017     Abdominal drain replacement with irrigation of abdominal fistula tract-[facility]-Dr. Lawernce Keas     Family History  Problem Relation Age of Onset   ??? Hypertension Mother    ??? Cancer Father         mesothelioma   ??? Diabetes Father    ??? Hypertension Father      Social History     Tobacco Use   ??? Smoking status: Former Smoker     Packs/day: 1.00     Last attempt to quit: 06/27/2011     Years since quitting: 7.5   ??? Smokeless tobacco: Never Used   Substance Use Topics   ??? Alcohol use: Yes     Alcohol/week: 1.0 - 2.0 standard drinks     Types: 1 - 2 Glasses of wine per week     Comment: Per month       The patient does not have a history of falls. A plan of care for falls was documented..  Depression screen positive, medication was prescribed, drug therapy education given.      Objective:  There were no vitals taken for this visit.    In NAD. A&O.  HEENT -- Pupils round.  O/P Clear.  Neck -- Supple. No JVD.    Abdomen -- Soft. Non-tender. \\ bulging seen when she sits up.  Right abdomen      Assessment/Plan:    ICD-10-CM ICD-9-CM     1. Incisional hernia, without obstruction or gangrene K43.2 553.21    2. Atherosclerosis of abdominal aorta (HCC) I70.0 440.0    3. Hypertension complicating diabetes ([place]) E11.59 250.80     I10 401.9    4. Type 2 diabetes mellitus with hyperglycemia, with long-term current use of insulin (HCC) E11.65 250.00     Z79.4 790.29      V58.67    5. Morbid obesity due to excess calories (HCC) E66.01 278.01      Refill meds.  Follow up surgeon when able      Author:  Genene Churn, MD 01/24/2019 4:14 PM

## 2019-01-24 NOTE — Progress Notes (Signed)
Dana [name] is a 63 y.o. female and presents with   Chief Complaint   Patient presents with   ??? Medication Refill   ??? Abdominal Pain   .  Virtual visit with patient for med refill.  She needs refills on free style sensor and her colchicine.   She also has noticed bulging in her abdomen when she sits from prone position. Disappears when she sits up. Causes some discomfort when changing position but does not persist. There is no change in appetite, nausea, vomiting, change in bowel habits.      Current Outpatient Medications   Medication Sig Dispense Refill   ??? colchicine 0.6 mg tablet Take 1 Tab by mouth two (2) times a day. 20 Tab 0   ??? flash glucose sensor (FreeStyle Libre 14 Day Sensor) kit USE TO TEST BLOOD SUGAR EVERY 14 DAYS 6 Kit 1   ??? amLODIPine (NORVASC) 5 mg tablet TAKE 1 TABLET BY MOUTH EVERY DAY 30 Tab 1   ??? metFORMIN ER (GLUCOPHAGE XR) 500 mg tablet TAKE 1 TABLET EVERY MORNING AND 2 TABLETS WITH DINNER 270 Tab 0   ??? simvastatin (ZOCOR) 40 mg tablet TAKE 1 TABLET NIGHTLY 90 Tab 0   ??? triamterene-hydroCHLOROthiazide (MAXZIDE) 37.5-25 mg per tablet TAKE 1 TABLET DAILY 90 Tab 0   ??? insulin NPH (HumuLIN N NPH Insulin KwikPen) 100 unit/mL (3 mL) inpn INJECT 32 UNITS SUBCUTANEOUSLY AT BEDTIME  Indications: e11.9 45 Adjustable Dose Pre-filled Pen Syringe 0   ??? pantoprazole (PROTONIX) 40 mg tablet Take once daily 90 Tab 0   ??? sertraline (ZOLOFT) 100 mg tablet TAKE 1 TABLET BY MOUTH DAILY 90 Tab 0   ??? triamterene (DYRENIUM) 50 mg capsule Take 1 Cap by mouth daily. 90 Cap 0   ??? insulin lispro (HumaLOG KwikPen Insulin) 100 unit/mL kwikpen USE 2 UNITS FOR BLOOD SUGARS 150-200USE 4 UNITS FOR 201-250, 6 UNITS FOR 251-300. 8 UNITS FOR 301-350 AND 10 UNITS FOR >350. DX E11.65 1 Package 1   ??? omeprazole (PRILOSEC) 40 mg capsule Take 1 Cap by mouth two (2) times a day. 180 Cap 2   ??? hydrOXYzine HCl (ATARAX) 50 mg tablet Take 50 mg by mouth three (3) times daily as needed for Itching.     ??? melatonin 5 mg cap capsule Take  5 mg by mouth nightly.       Allergies   Allergen Reactions   ??? Adhesive Itching   ??? Dolobid [Diflunisal] Itching     Itching,swelling     Past Medical History:   Diagnosis Date   ??? Atherosclerosis of abdominal aorta (HCC)    ??? Diabetes ([place]) 01/24/2013   ??? GERD (gastroesophageal reflux disease)    ??? Gout    ??? Hives    ??? HTN (hypertension)    ??? Hypercholesterolemia    ??? Perforated gastric ulcer ([place])    ??? Postprocedural intraabdominal abscess      Past Surgical History:   Procedure Laterality Date   ??? ABDOMEN SURGERY PROC UNLISTED  05/12/2017    Lap exploratory perforated posterior ulcer repair by Dr. Claudie Leach    ??? HX HYSTERECTOMY     ??? HX OTHER SURGICAL  05/13/2017    PROCEDURES PERFORMED:  Exploratory laparotomy, repair of perforated posterior ulcer with oversew and onlay omental patch-[facility]-DR. S. Shindel   ??? HX OTHER SURGICAL  08/09/2017     Abdominal drain replacement with irrigation of abdominal fistula tract-[facility]-Dr. Lawernce Keas     Family History  Problem Relation Age of Onset   ??? Hypertension Mother    ??? Cancer Father         mesothelioma   ??? Diabetes Father    ??? Hypertension Father      Social History     Tobacco Use   ??? Smoking status: Former Smoker     Packs/day: 1.00     Last attempt to quit: 06/27/2011     Years since quitting: 7.5   ??? Smokeless tobacco: Never Used   Substance Use Topics   ??? Alcohol use: Yes     Alcohol/week: 1.0 - 2.0 standard drinks     Types: 1 - 2 Glasses of wine per week     Comment: Per month       The patient does not have a history of falls. A plan of care for falls was documented..  Depression screen positive, medication was prescribed, drug therapy education given.      Objective:  There were no vitals taken for this visit.    In NAD. A&O.  HEENT -- Pupils round.  O/P Clear.  Neck -- Supple. No JVD.    Abdomen -- Soft. Non-tender. \\ bulging seen when she sits up.  Right abdomen      Assessment/Plan:    ICD-10-CM ICD-9-CM    1. Incisional hernia, without obstruction or gangrene K43.2  553.21    2. Atherosclerosis of abdominal aorta (HCC) I70.0 440.0    3. Hypertension complicating diabetes ([place]) E11.59 250.80     I10 401.9    4. Type 2 diabetes mellitus with hyperglycemia, with long-term current use of insulin (HCC) E11.65 250.00     Z79.4 790.29      V58.67    5. Morbid obesity due to excess calories (HCC) E66.01 278.01      Refill meds.  Follow up surgeon when able      Author:  Genene Churn, MD 01/24/2019 4:14 PM

## 2019-03-05 MED ORDER — TRIAMTERENE 50 MG CAP
50 mg | ORAL_CAPSULE | ORAL | 0 refills | Status: AC
Start: 2019-03-05 — End: ?

## 2019-03-18 ENCOUNTER — Encounter

## 2019-03-19 MED ORDER — METFORMIN SR 500 MG 24 HR TABLET
500 mg | ORAL_TABLET | ORAL | 0 refills | Status: AC
Start: 2019-03-19 — End: ?

## 2019-04-17 ENCOUNTER — Encounter

## 2019-04-21 MED ORDER — AMLODIPINE 5 MG TAB
5 mg | ORAL_TABLET | ORAL | 0 refills | Status: AC
Start: 2019-04-21 — End: ?

## 2019-05-03 IMAGING — MG MAMMOGRAPHY SCREENING BILATERAL 3D TOMOSYNTHESIS WITH CAD
8 series · 8 of 24 positions shown · non-contrast
Comparison: Comparison was made to prior exams. 
BREAST DENSITY: (Level B) There are scattered areas of fibroglandular density.

MAMMOGRAPHY SCREENING BILATERAL 3D TOMOSYNTHESIS WITH CAD, 05/03/2019 [DATE]: 
CLINICAL INDICATION: Screening exam.
TECHNIQUE: Digital bilateral mammograms and 3-D Tomosynthesis were obtained. 
These were interpreted both primarily and with the aid of computer-aided 
detection system.

[L MLO]
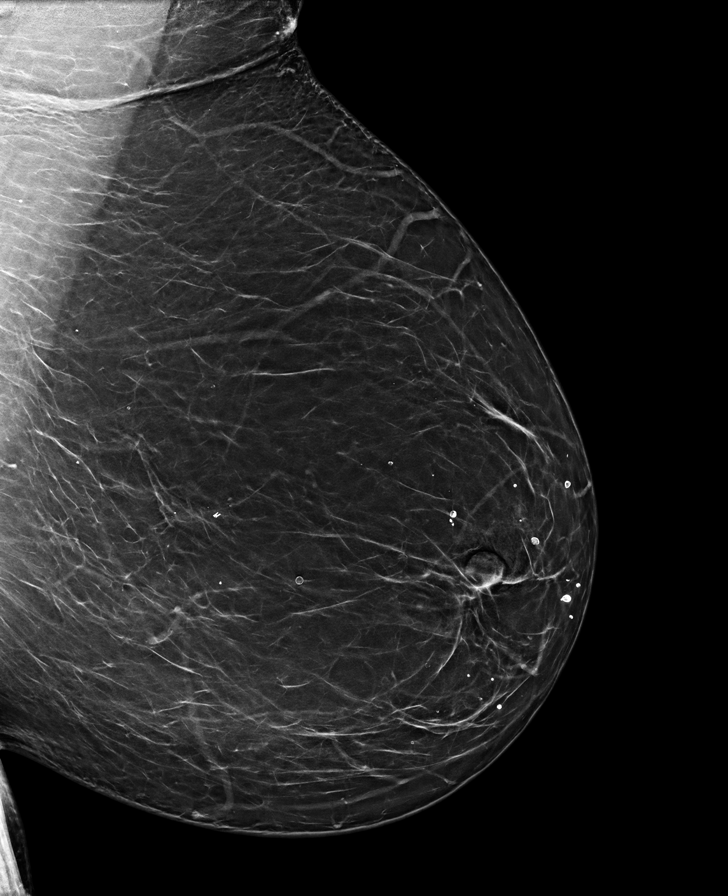

[R MLO]
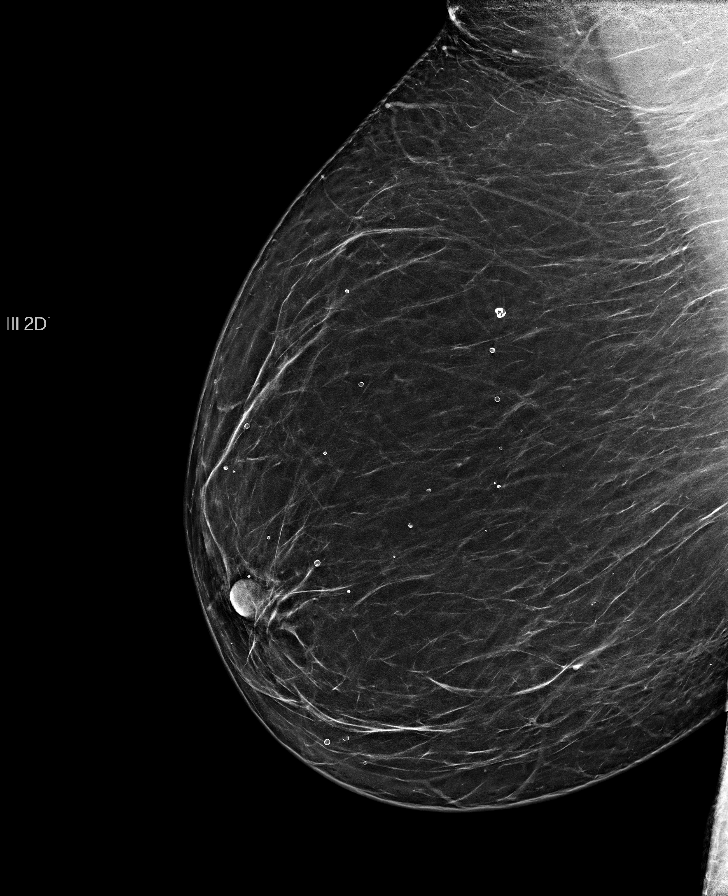

[L CC]
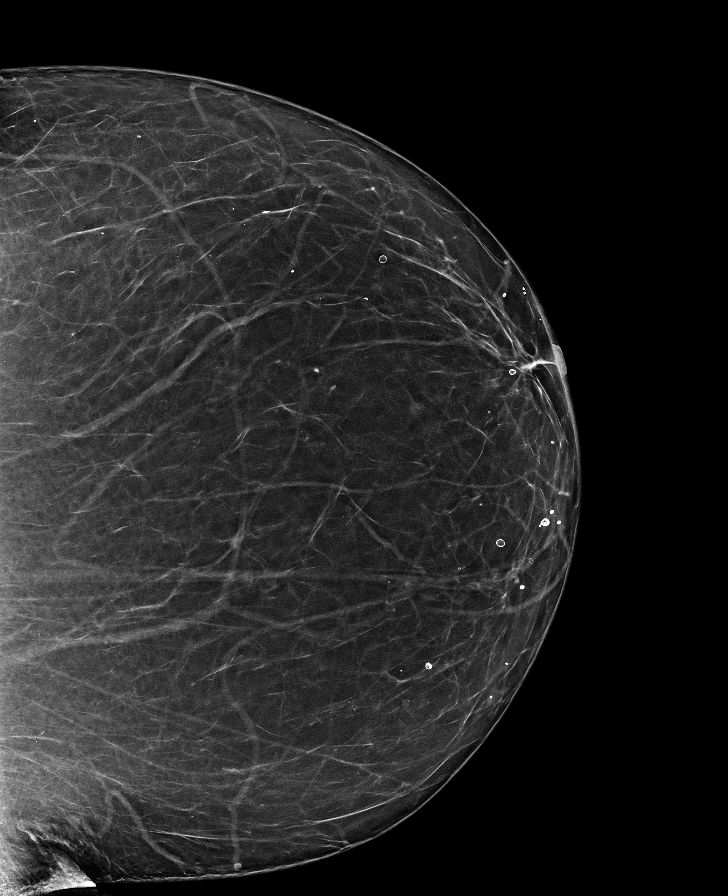

[R CC]
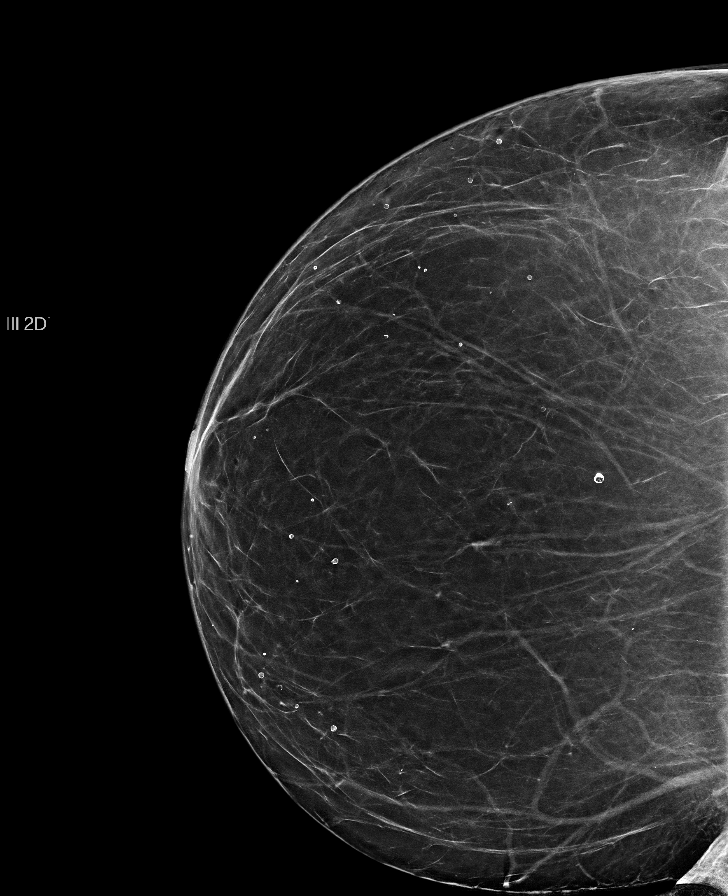

[L MLO tomo · tomo slice 45/89.0]
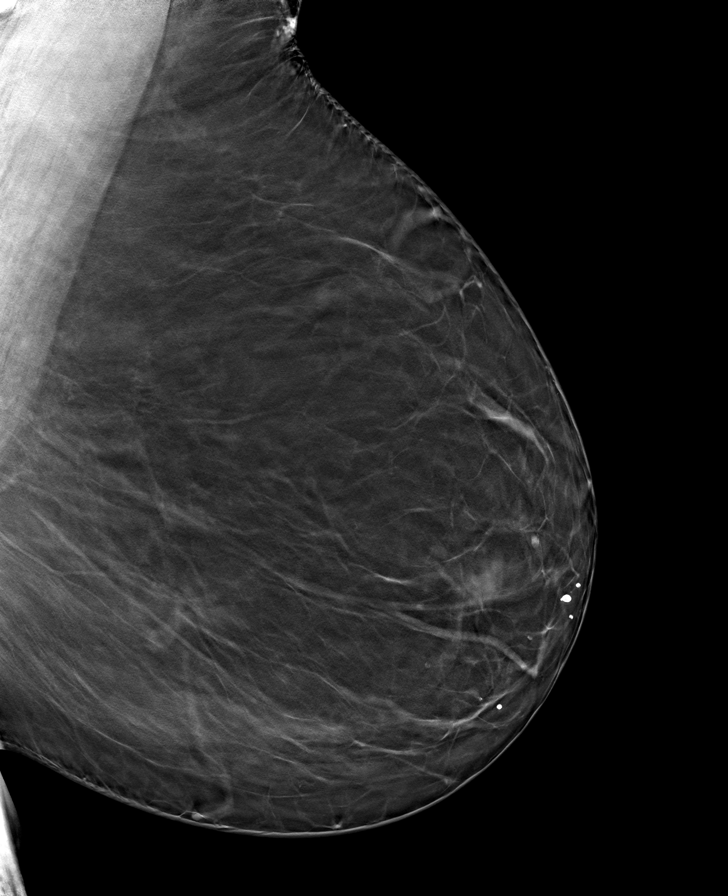

[L CC tomo · tomo slice 39/76.0]
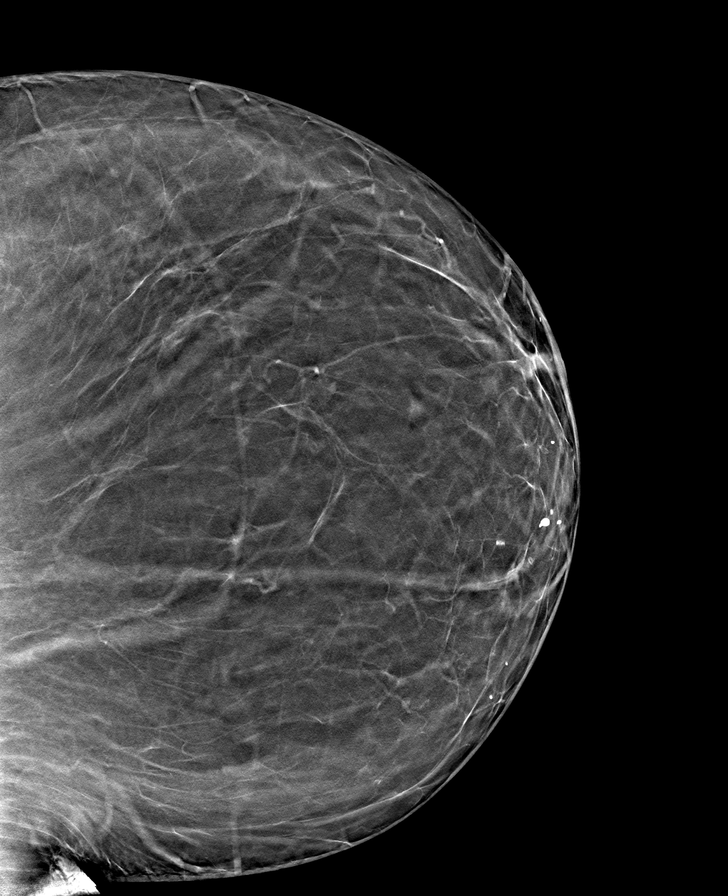

[R CC tomo · tomo slice 37/74.0]
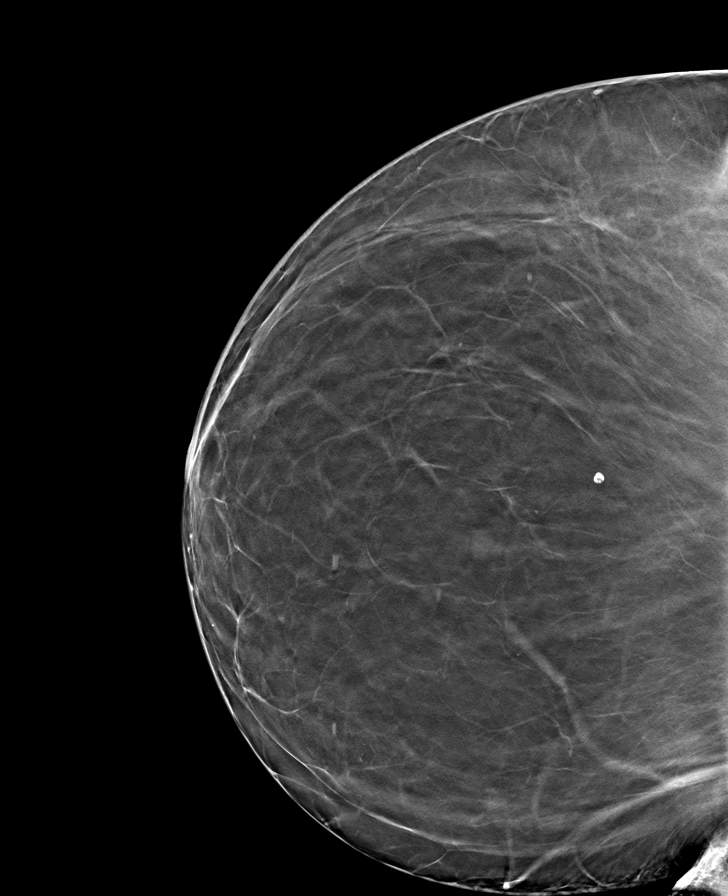

[R MLO tomo · tomo slice 43/85.0]
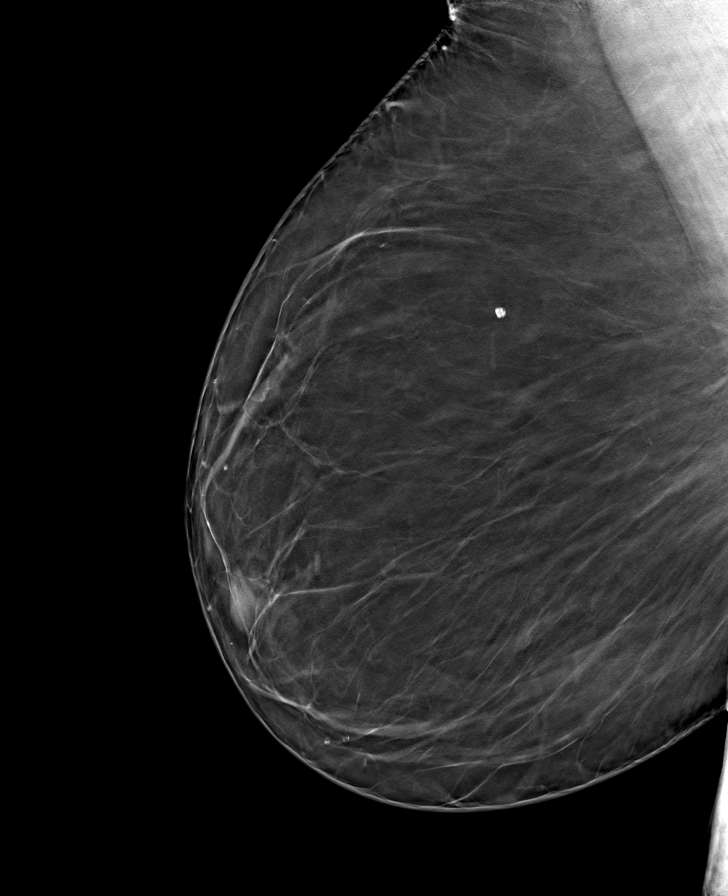

[8 of 24 positions shown; findings below may reference images not displayed]

FINDINGS: There is a circumscribed lesion in the left breast identified on CC projection 
only along the nipple line located 4 cm from the nipple. This measures 5 mm. 
This was present on prior examination but appears more conspicuous on today's 
exam. A correlate is not identified on the MLO projection. By tomosynthesis this 
localizes to the superior left breast. 
No suspicious mass, calcifications, or area of architectural distortion in the 
right breast.
IMPRESSION: 1.  Circumscribed mass in the left breast along the nipple line identified on CC 
projection only measuring 5 mm located 4 cm from the nipple. Finding could 
localize to the 12 o'clock or 6 o'clock position. Recommend diagnostic 
mammography in an attempt to better localize the lesion in the inferior or 
superior breast and an ultrasound for subsequent evaluation. 
2.  No mammographic evidence of malignancy in the right breast. 
(BI-RADS 0) Incomplete. Further evaluation will be performed as discussed above, 
and the results will be reported separately.

## 2019-05-15 IMAGING — MG MAMMOGRAPHY DIAGNOSTIC LEFT 3D TOMOSYNTHESIS WITH CAD
4 series · 4 of 12 positions shown · non-contrast
Comparison: 05/03/2019 
BREAST DENSITY: (Level B) There are scattered areas of fibroglandular density.

MAMMOGRAPHY DIAGNOSTIC LEFT 3D TOMOSYNTHESIS WITH CAD, 05/15/2019 [DATE]: 
CLINICAL INDICATION: Abnormal mammogram
TECHNIQUE: Unilateral left breast oblique medial lateral and craniocaudal full 
field digital mammogram and 3-D Tomosynthesis were obtained. In addition, 
computer-aided detection was utilized.

[L ML]
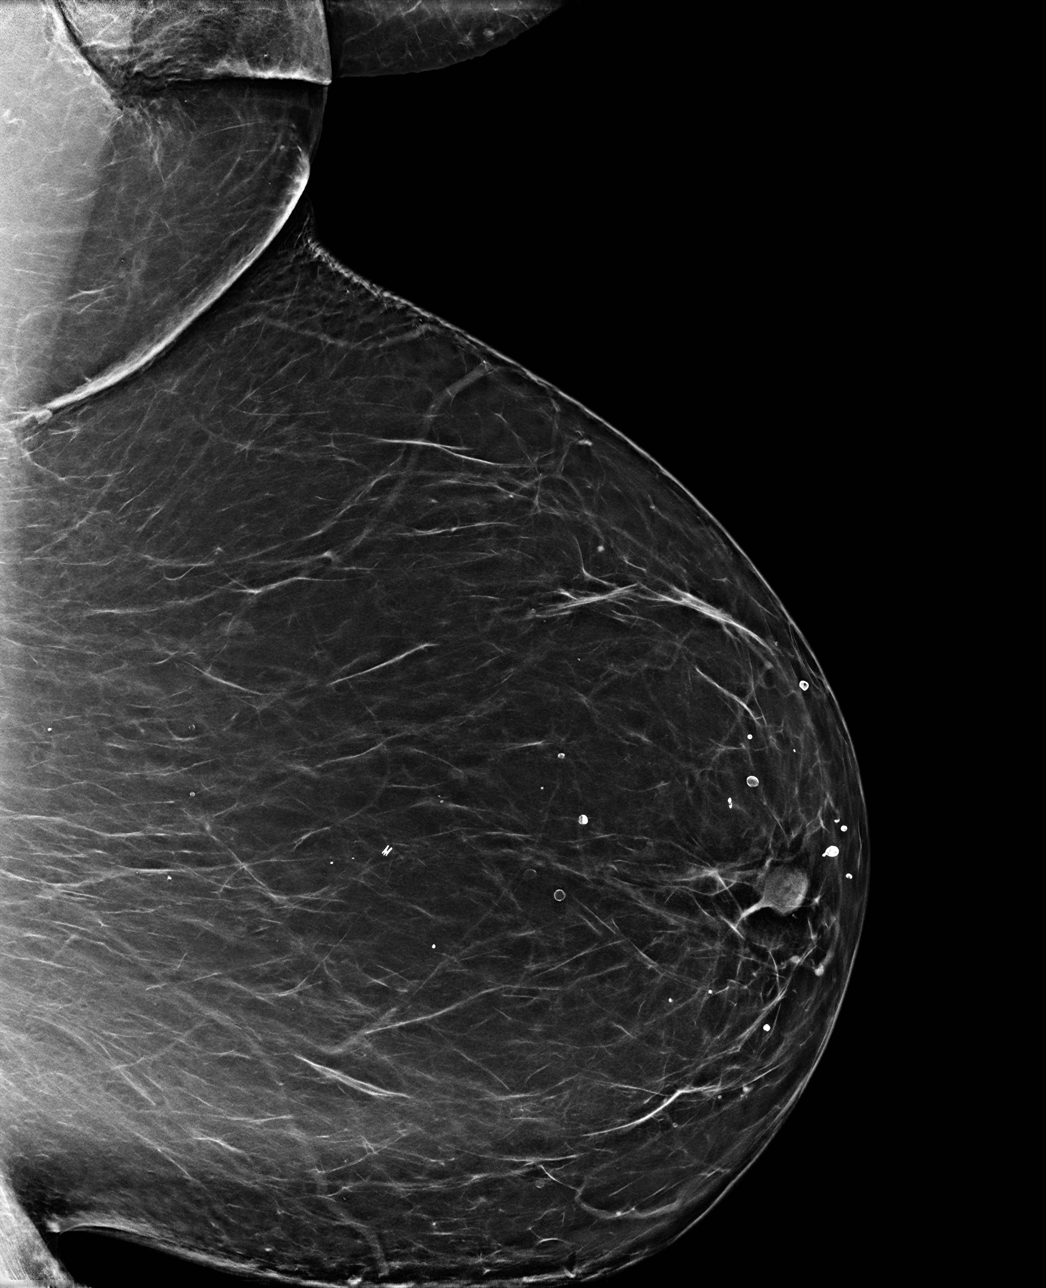

[L CC]
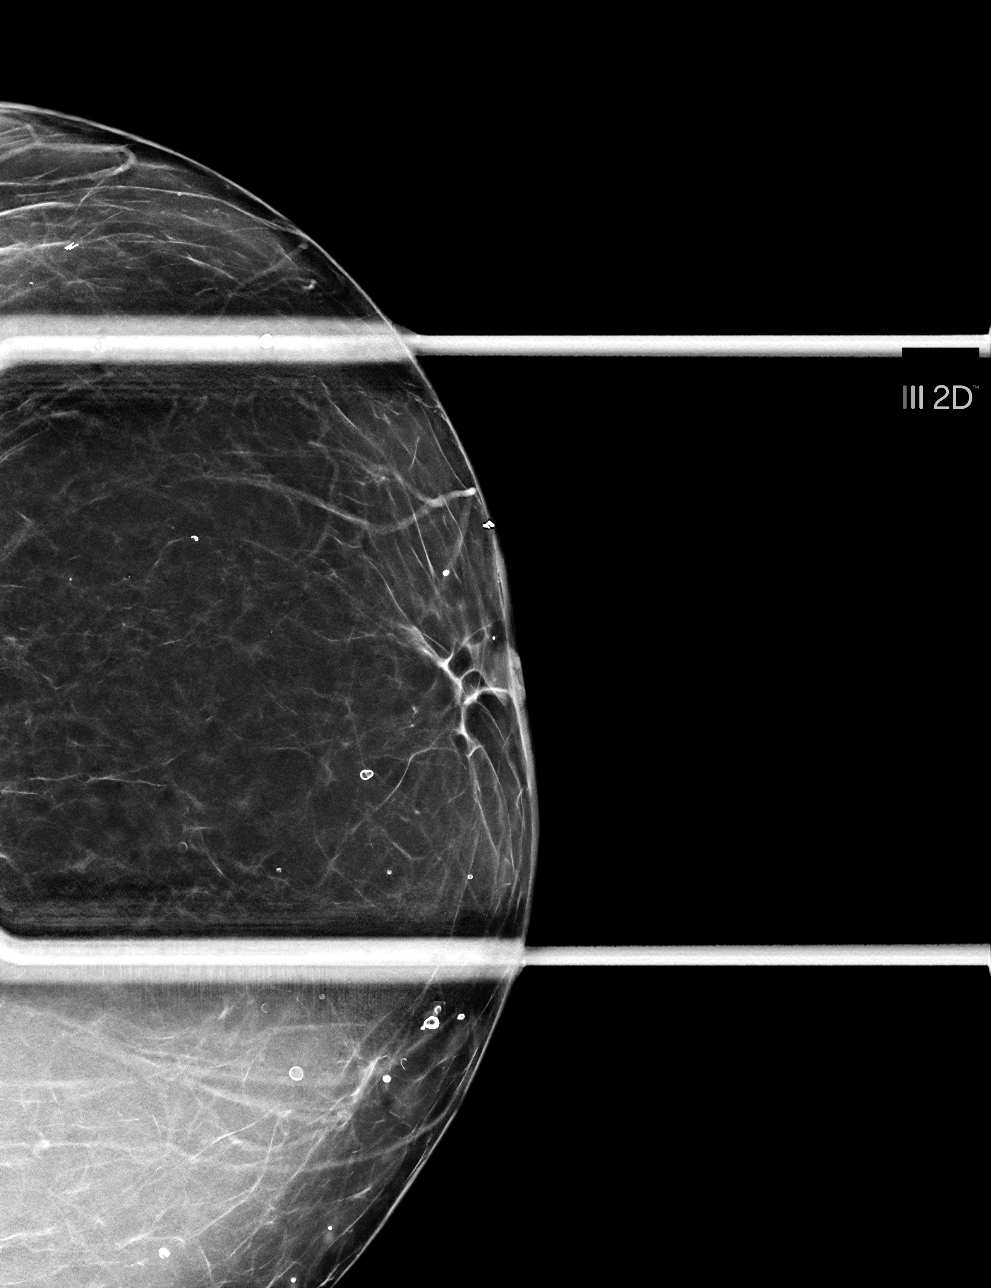

[L ML tomo · tomo slice 43/86.0]
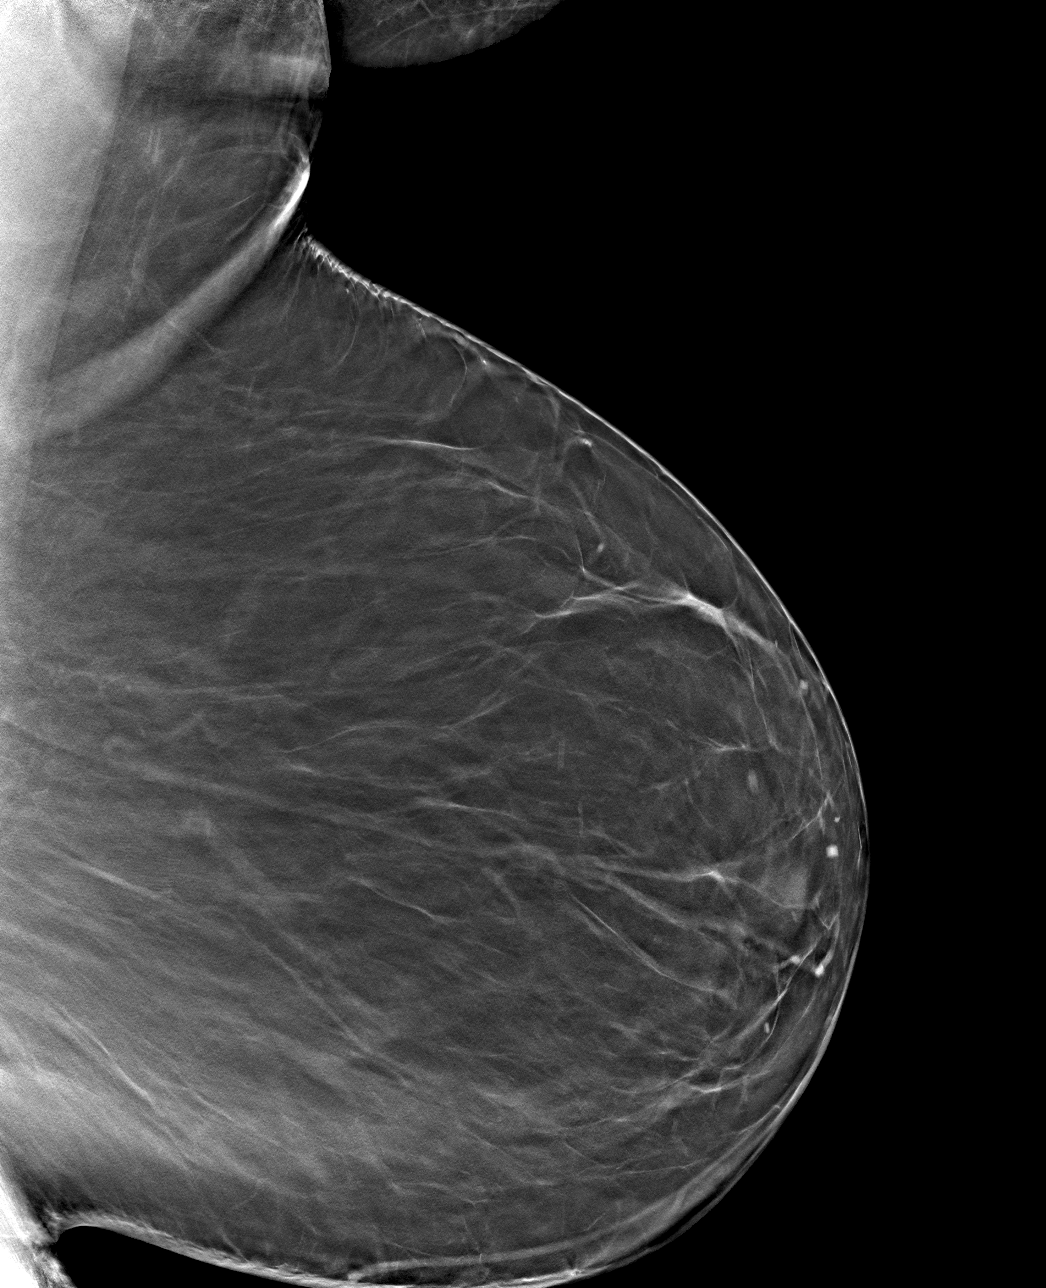

[L CC tomo · tomo slice 31/60.0]
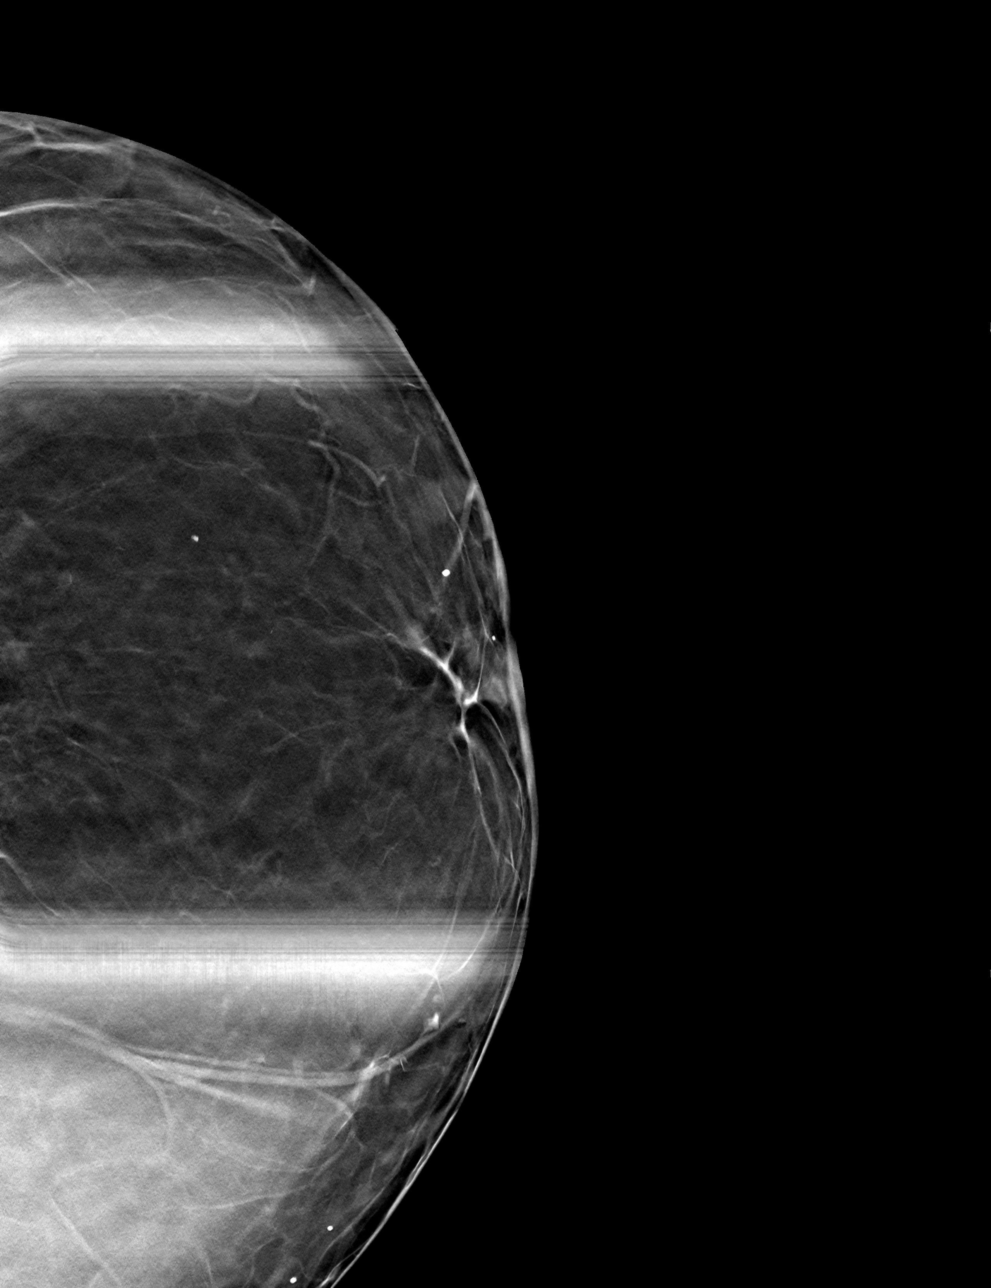

[4 of 12 positions shown; findings below may reference images not displayed]

FINDINGS: The area of concern within the left breast presses out. No residual 
mass seen.
IMPRESSION: ( BI-RADS 2) Benign findings. Routine mammographic follow-up is recommended.

## 2019-05-23 ENCOUNTER — Encounter

## 2019-06-01 ENCOUNTER — Encounter

## 2019-08-07 ENCOUNTER — Encounter

## 2019-11-04 ENCOUNTER — Encounter

## 2019-12-19 ENCOUNTER — Encounter

## 2020-05-28 ENCOUNTER — Encounter

## 2020-07-24 IMAGING — MG MAMMOGRAPHY SCREENING BILATERAL 3D TOMOSYNTHESIS WITH CAD
6 of 9 series · 6 of 25 positions shown · non-contrast
Comparison: 05/15/2019 and dating back to 05/05/2018. 
BREAST DENSITY: (Level B) There are scattered areas of fibroglandular density.

MAMMOGRAPHY SCREENING BILATERAL 3D TOMOSYNTHESIS WITH CAD, 07/24/2020 [DATE]: 
CLINICAL INDICATION: Screening exam.
TECHNIQUE: Digital bilateral mammograms and 3-D Tomosynthesis were obtained. 
These were interpreted both primarily and with the aid of computer-aided 
detection system.

[L MLO (1 of 2)]
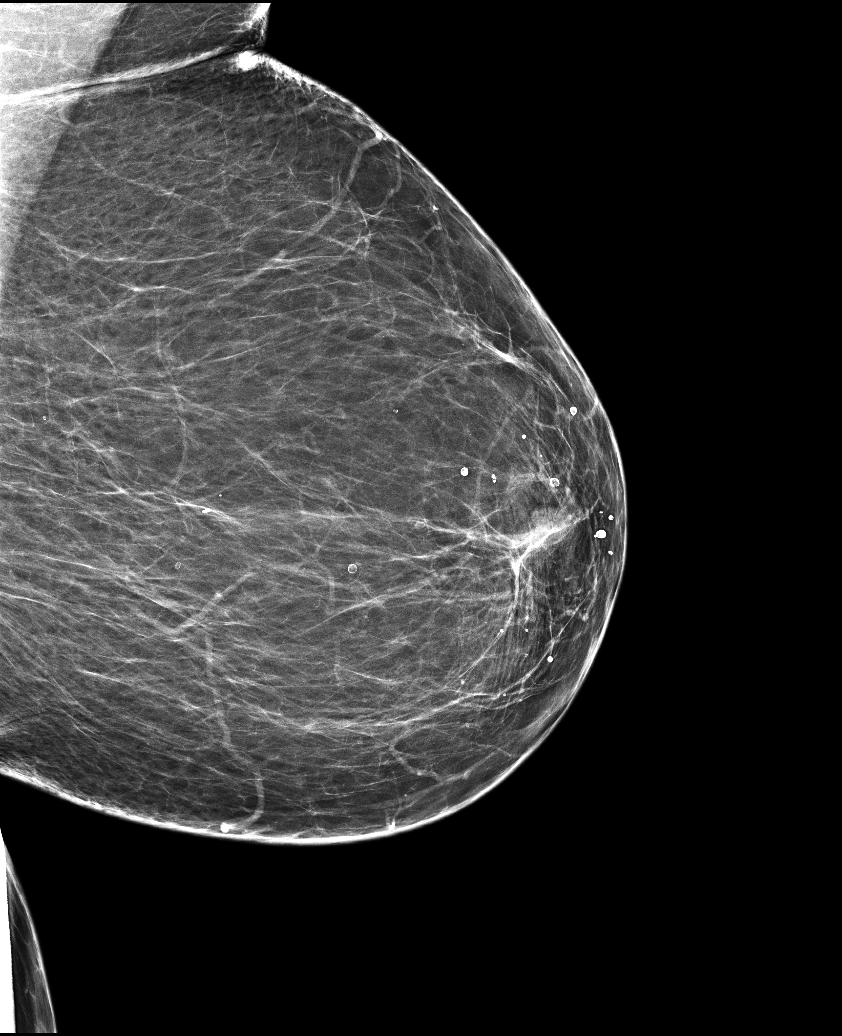

[L MLO (2 of 2)]
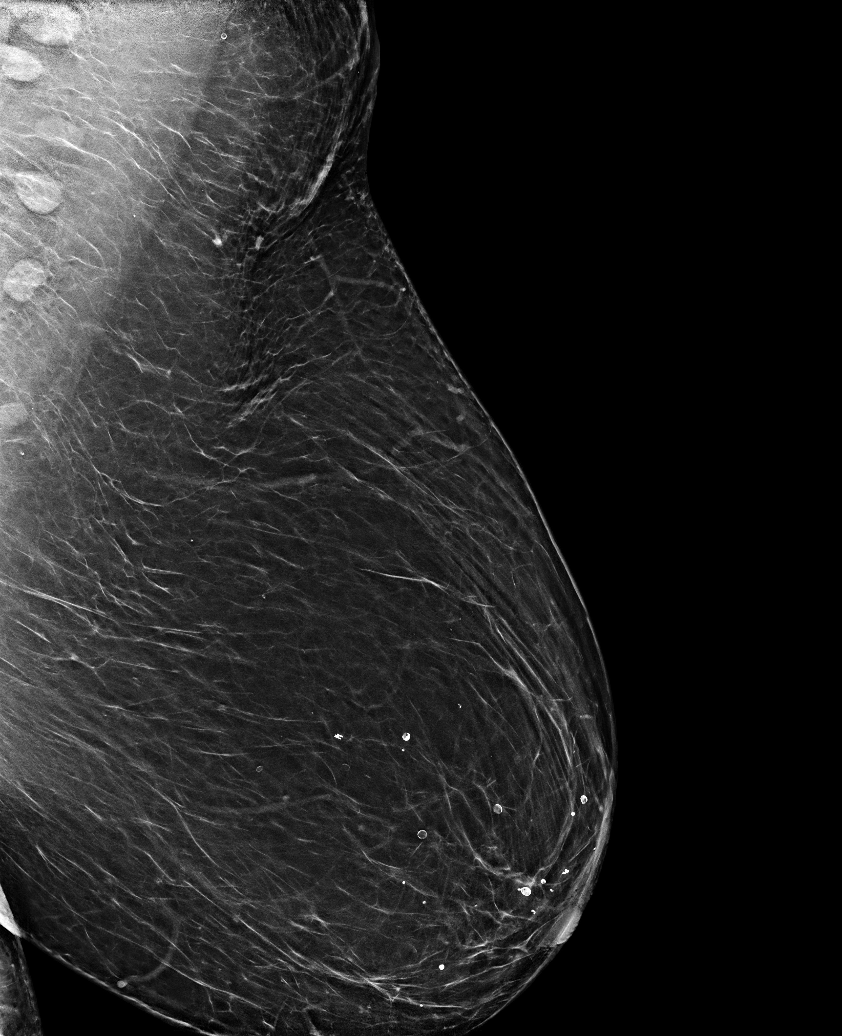

[R CC]
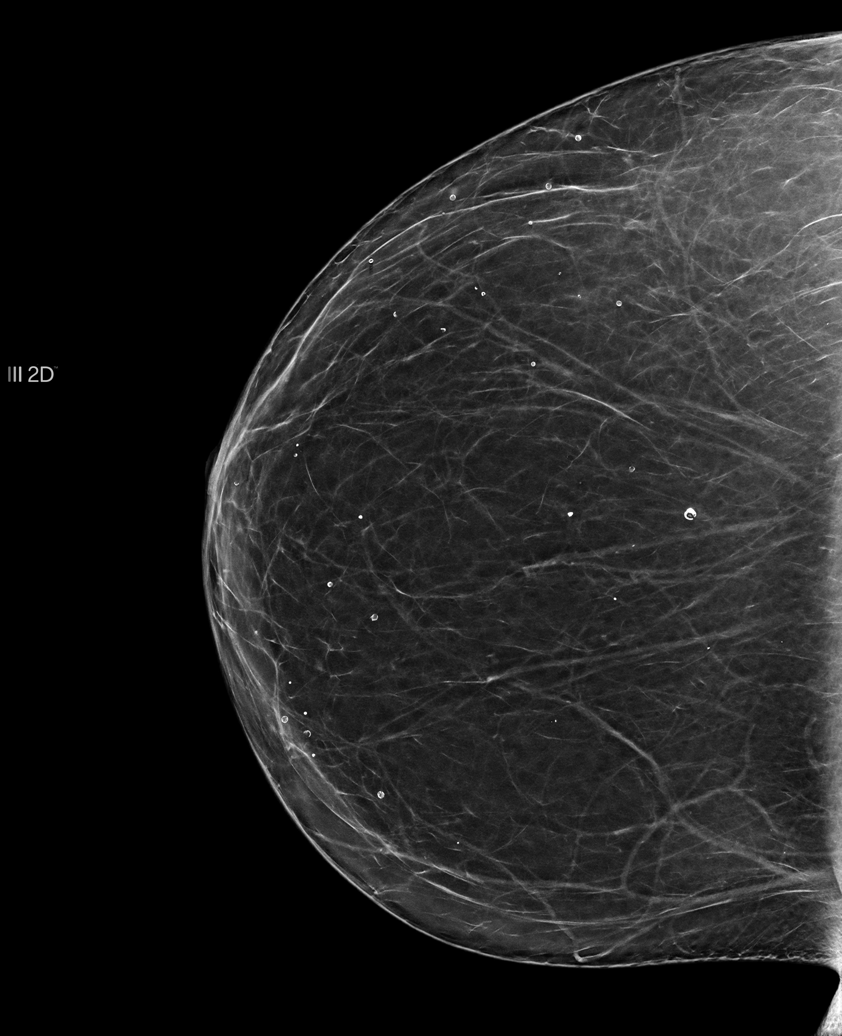

[L CC]
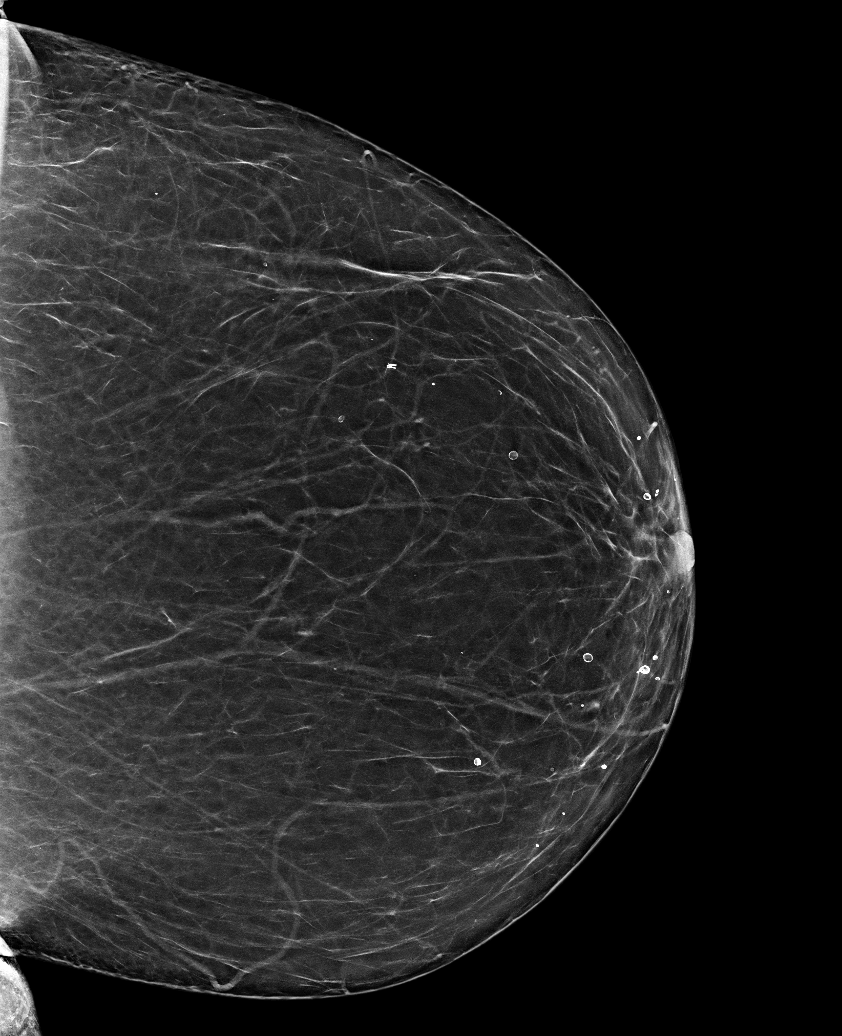

[R MLO]
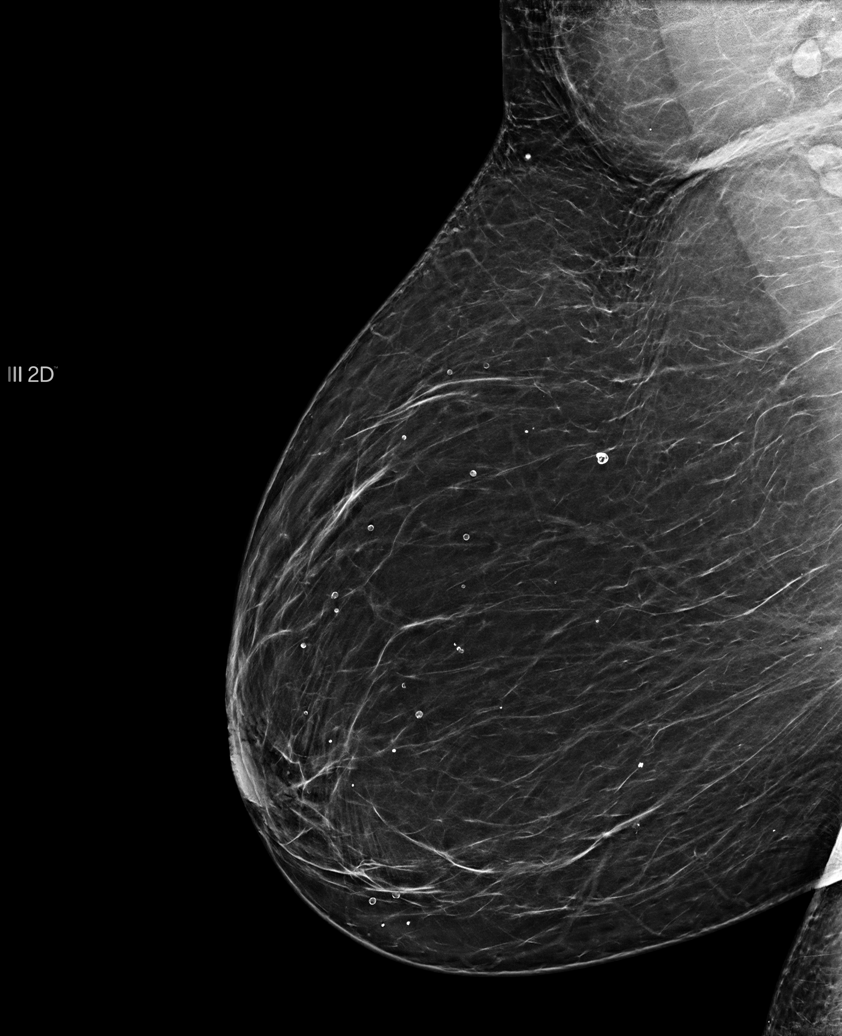

[L CC tomo · tomo slice 35/68.0]
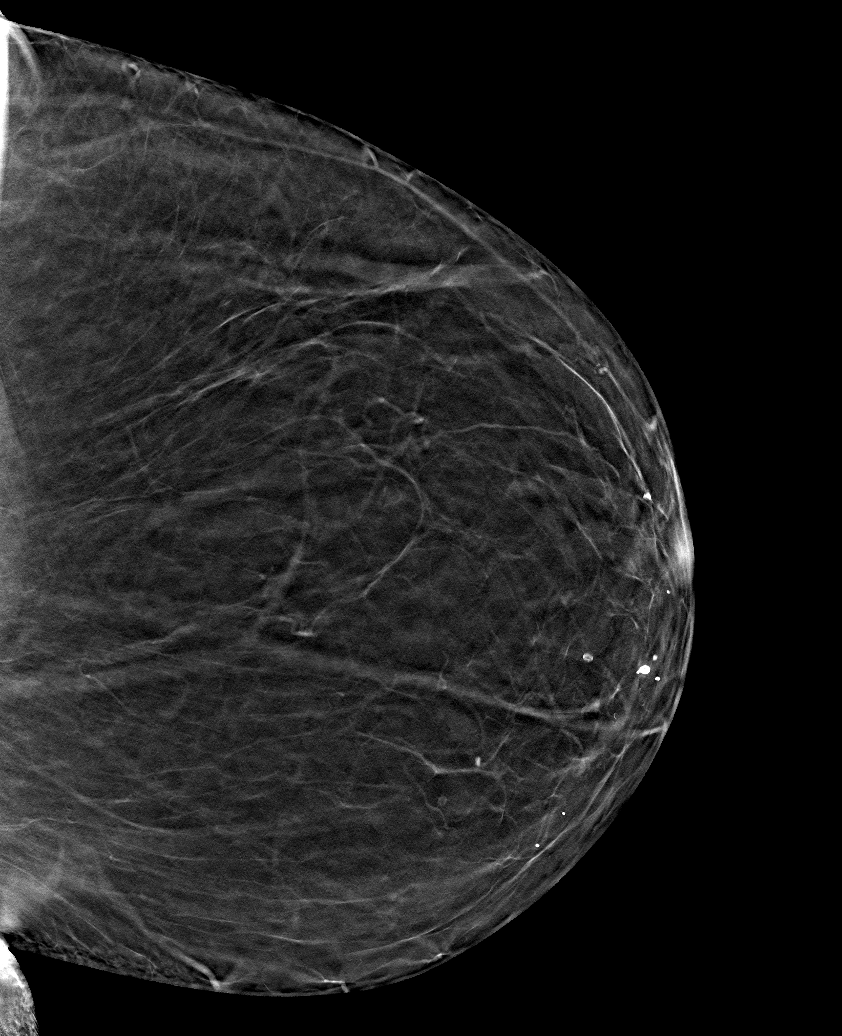

[6 of 25 positions shown; findings below may reference images not displayed]

FINDINGS: Overall stable mammographic appearance. Postbiopsy tissue marker left 
breast. No mammographic findings suggestive for malignancy.
IMPRESSION: ( BI-RADS 2) Benign findings. Routine mammographic follow-up is recommended.

## 2021-08-25 IMAGING — MG MAMMOGRAPHY SCREENING BILATERAL 3[PERSON_NAME]
8 series · 8 of 24 positions shown · non-contrast
Comparison: Comparison was made to prior examinations.

________________________________________________________________________________________________ 
MAMMOGRAPHY SCREENING BILATERAL 3JADULLA VIDHAADH, 08/25/2021 [DATE]: 
CLINICAL INDICATION: Screening.
TECHNIQUE: Digital bilateral mammograms and 3-D Tomosynthesis were obtained. 
These were interpreted both primarily and with the aid of computer-aided 
detection system.  
BREAST DENSITY: (Level A) The breasts are almost entirely fatty.

[R MLO]
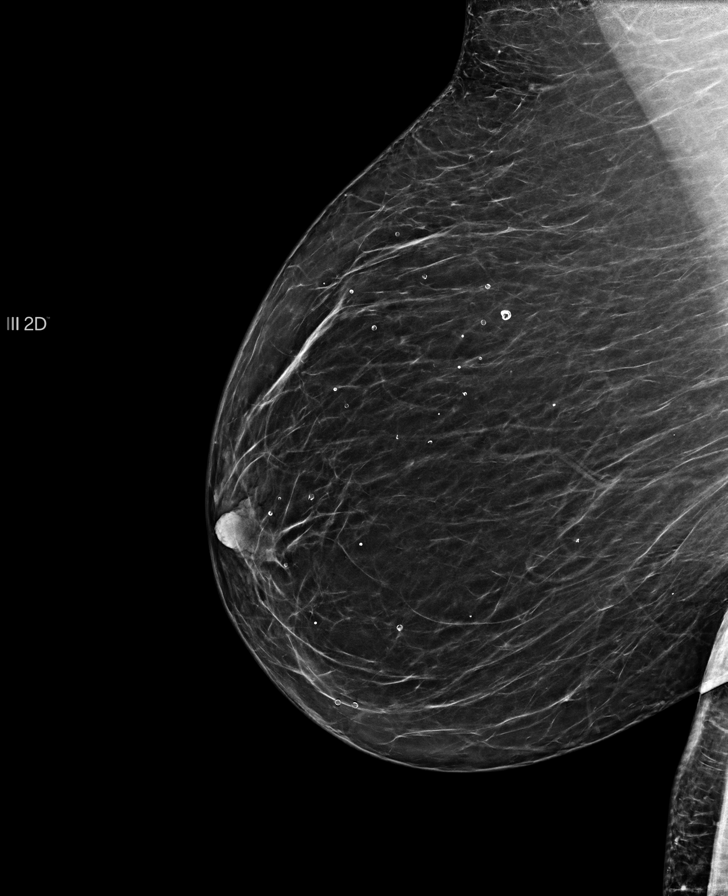

[L MLO]
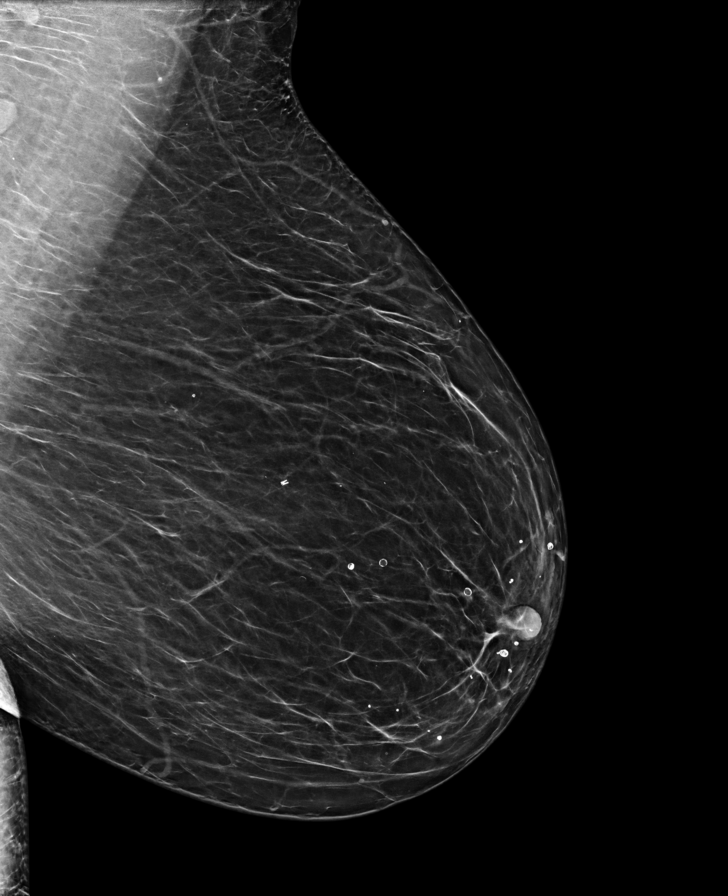

[L CC]
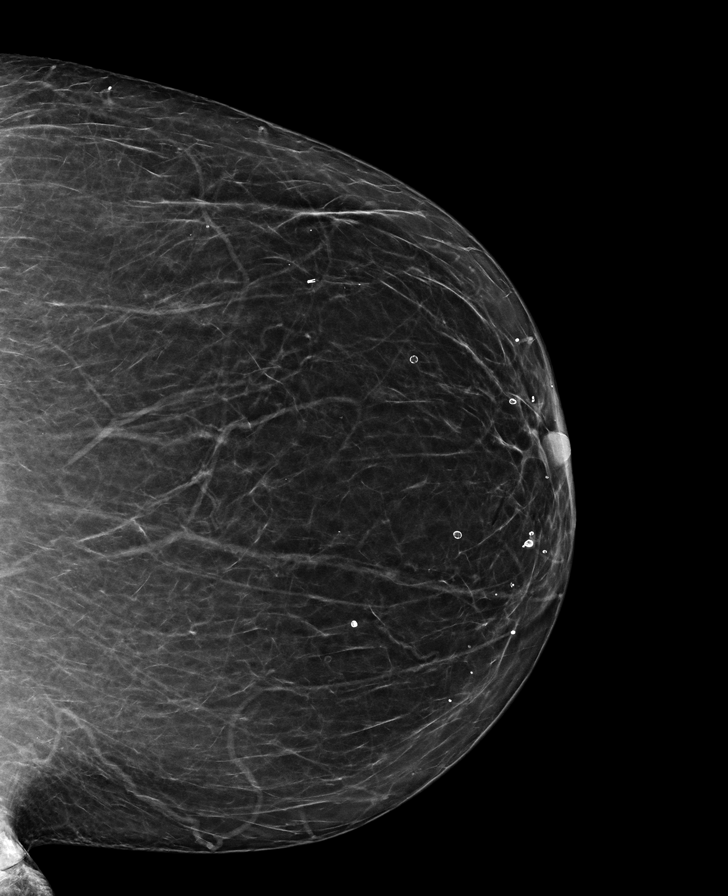

[R CC]
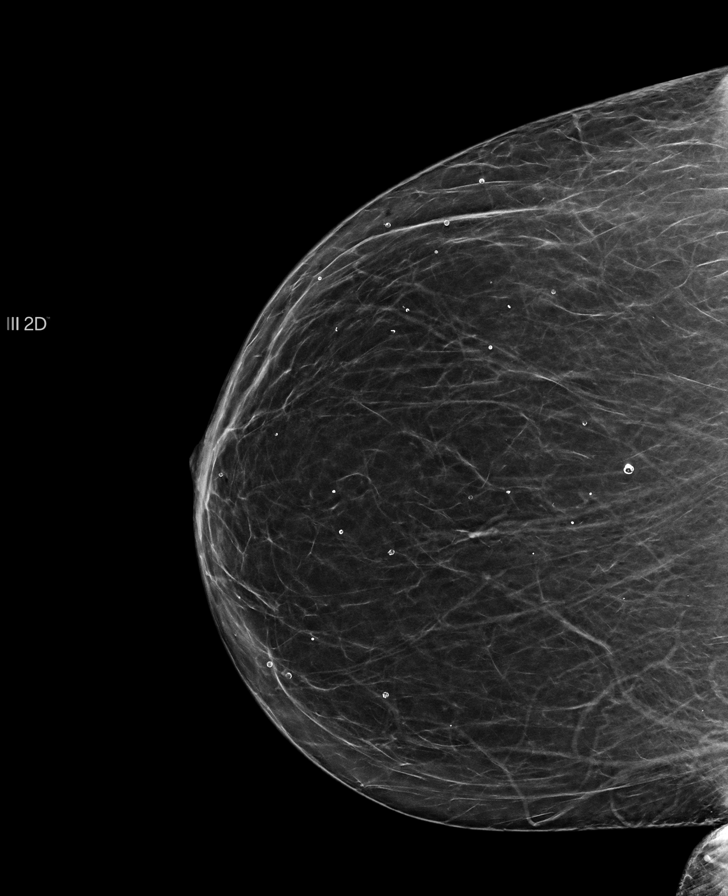

[L MLO tomo · tomo slice 34/67.0]
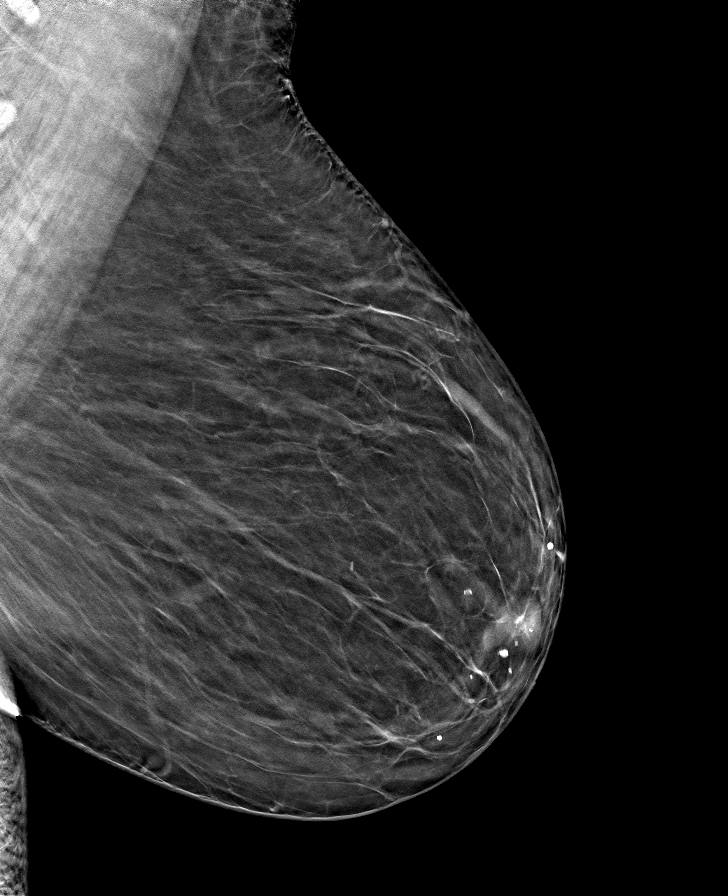

[R CC tomo · tomo slice 27/54.0]
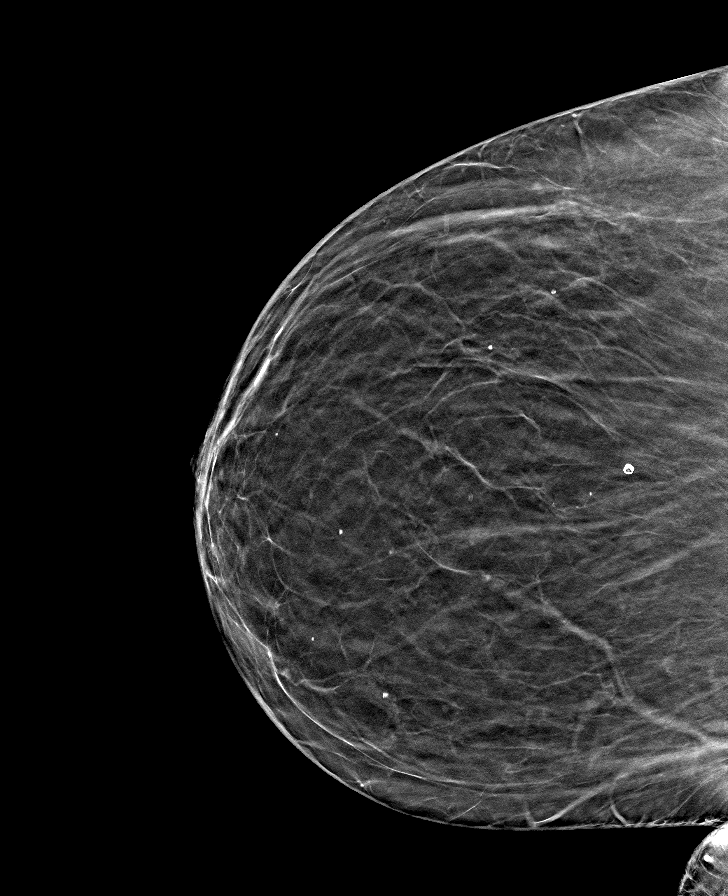

[R MLO tomo · tomo slice 33/65.0]
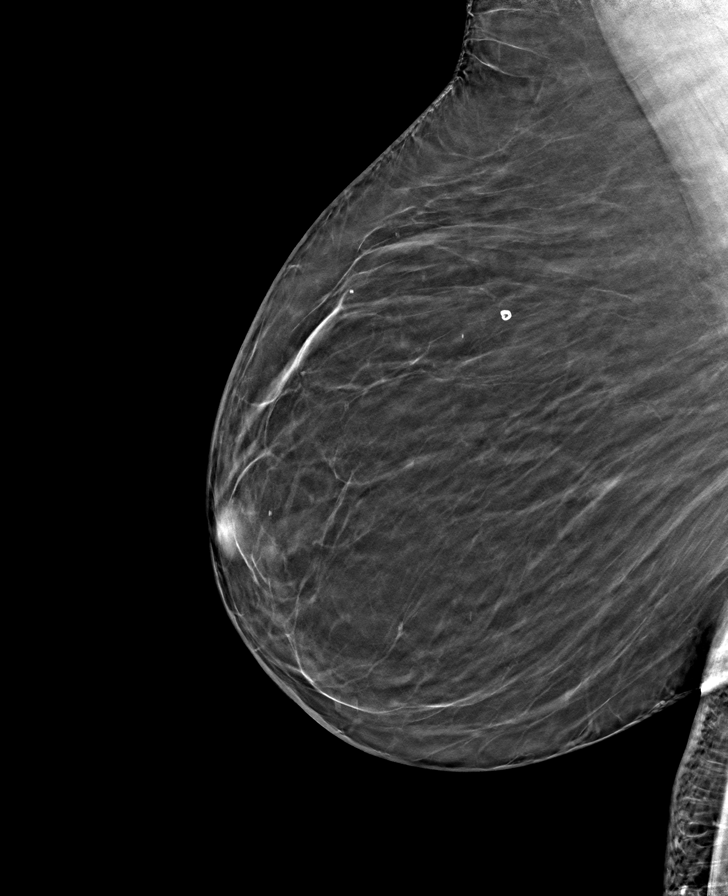

[L CC tomo · tomo slice 29/56.0]
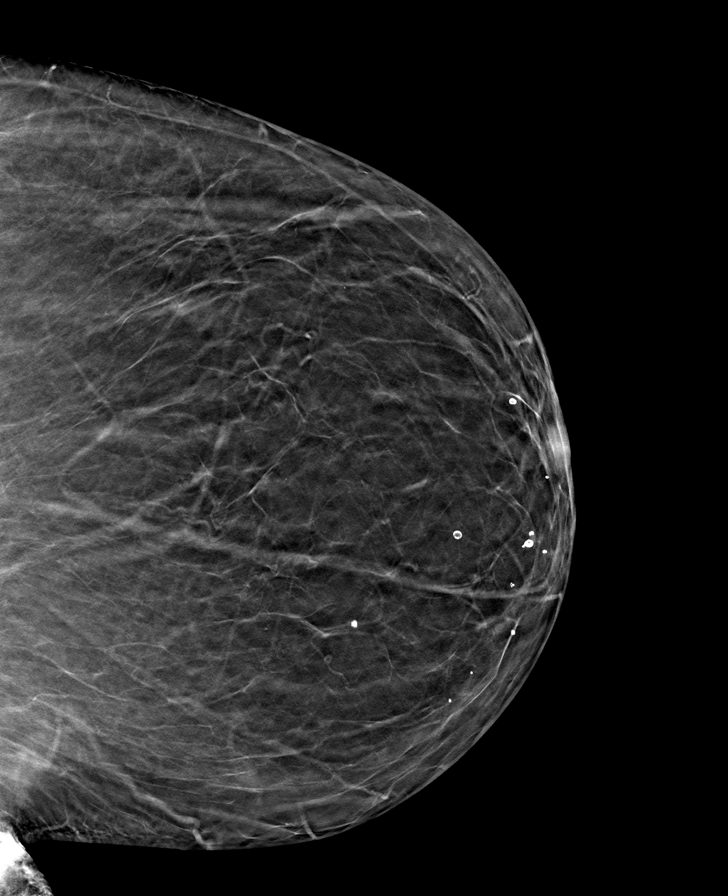

[8 of 24 positions shown; findings below may reference images not displayed]

FINDINGS: No suspicious mass, calcifications, or area of architectural 
distortion in either breast.
IMPRESSION: Stable exam. 
(BI-RADS 2) Benign findings. Routine mammographic follow-up is recommended.

## 2021-10-22 IMAGING — CT CT ABDOMEN AND PELVIS W/WO IV CONT W/ ORAL CONT
2 of 4 series · 15 of 46 positions shown, 17 images · IV contrast (APPLIED)
Comparison: None.

________________________________________________________________________________________________ 
CT ABDOMEN AND PELVIS W/WO IV CONT W/ ORAL CONT, 10/22/2021 [DATE]: 
CLINICAL INDICATION: Ventral hernia without obstruction. Prior history of 
surgery for ruptured ulcer and perforated colon. 
A search for DICOM formatted images was conducted for prior CT imaging studies 
completed at a non-affiliated media free facility.
TECHNIQUE: The abdomen and pelvis were scanned from lung bases through the 
pubic rami without and with 100 mL of Isovue 300 contrast on a high-resolution 
CT scanner using dose reduction techniques. Patient drank diluted oral contrast 
for this study. Routine MPR reconstructions were performed. The patients eGFR 
was calculated to be 63 mL/min/1.73 m2 using the i-STAT device.

[Series 5: abd/pel axial w · axial · 0.87mm/px · z∈[+765,+1125]mm · 12 of 136 slices shown, 14 images]
[im 8/136  soft-tissue]
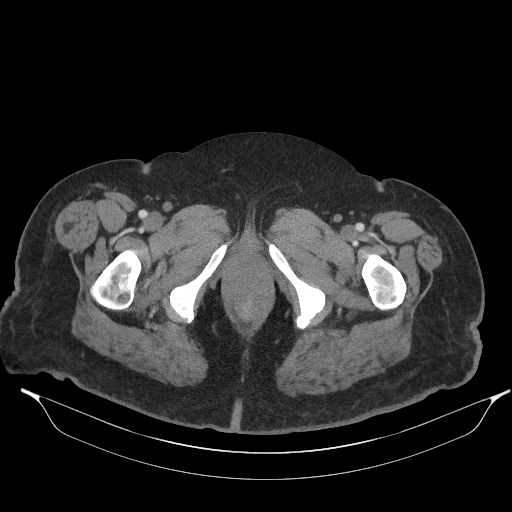
[im 8/136  bone]
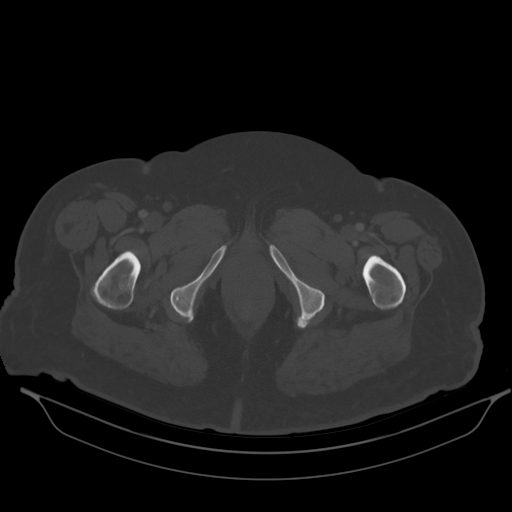
[im 22/136  soft-tissue]
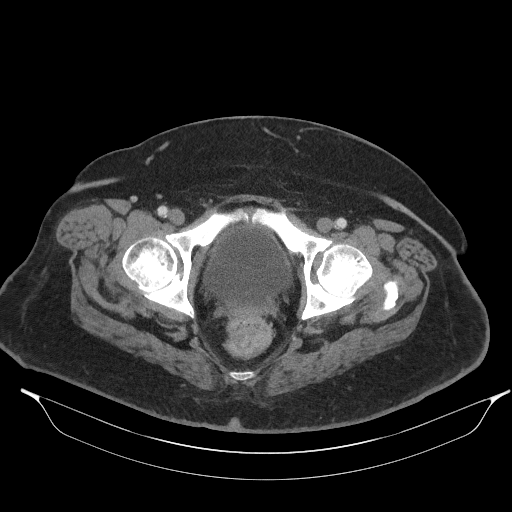
[im 29/136  soft-tissue]
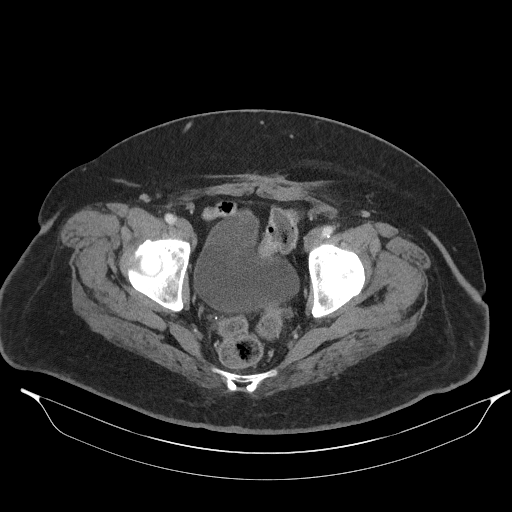
[im 43/136  soft-tissue]
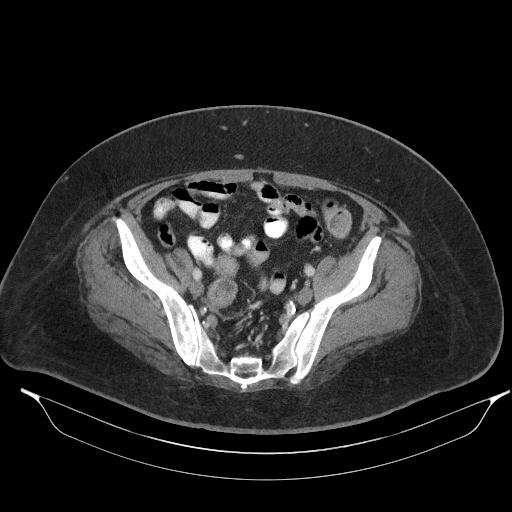
[im 50/136  soft-tissue]
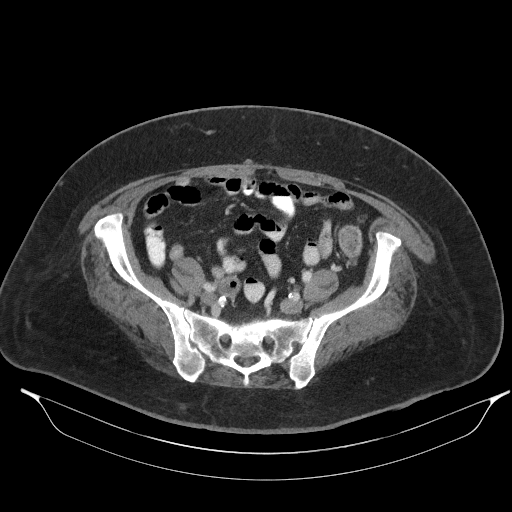
[im 64/136  soft-tissue]
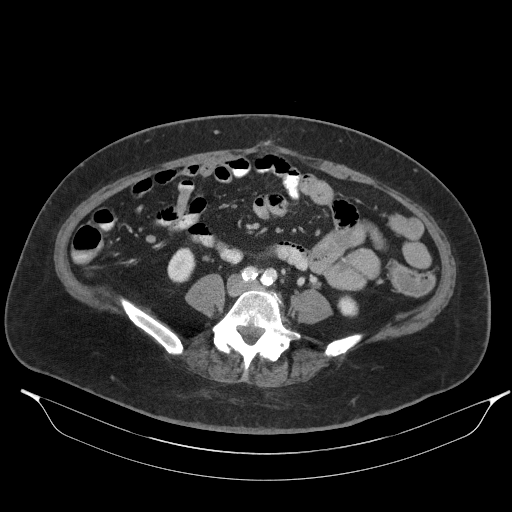
[im 72/136  soft-tissue]
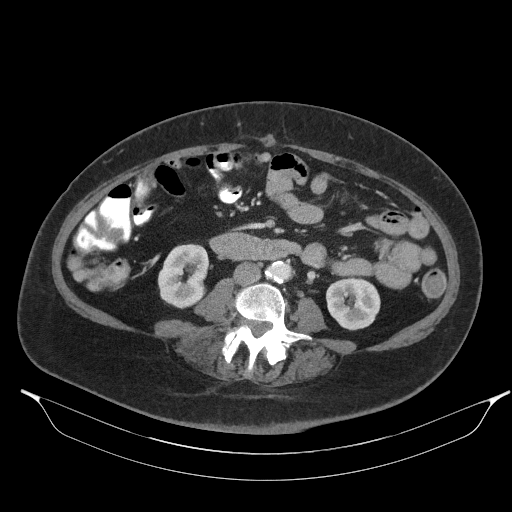
[im 86/136  soft-tissue]
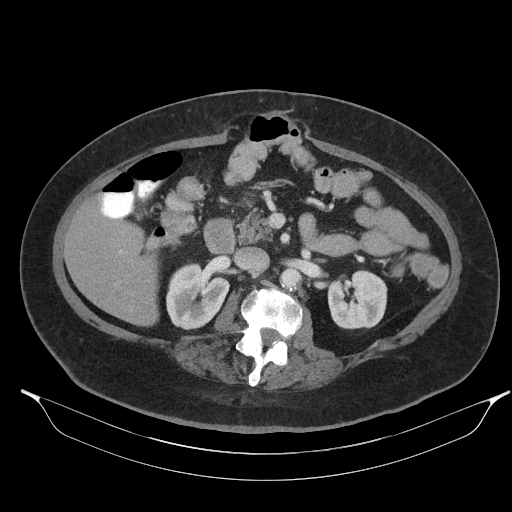
[im 93/136  soft-tissue]
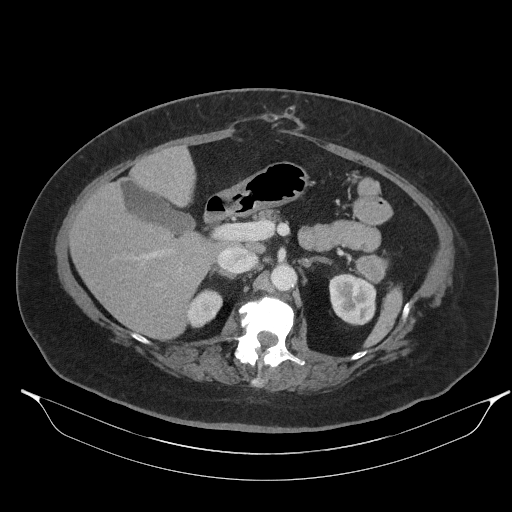
[im 93/136  bone]
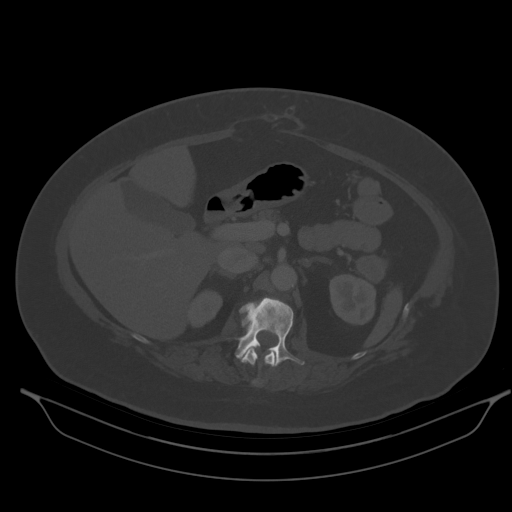
[im 107/136  soft-tissue]
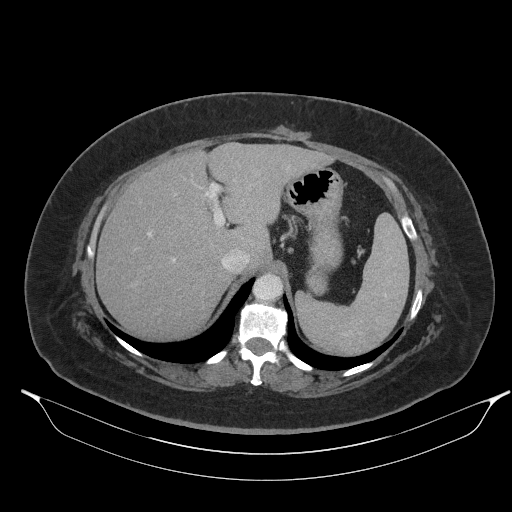
[im 114/136  soft-tissue]
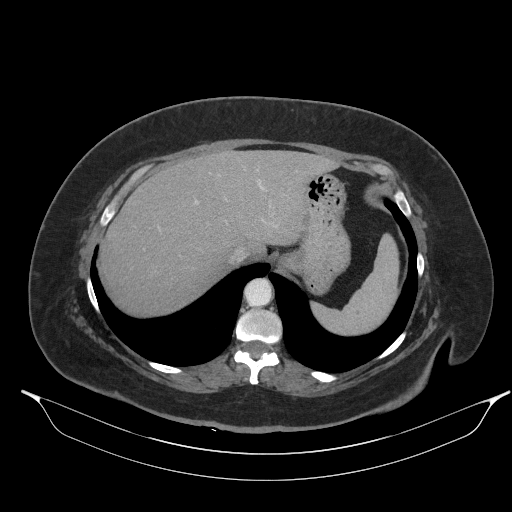
[im 128/136  soft-tissue]
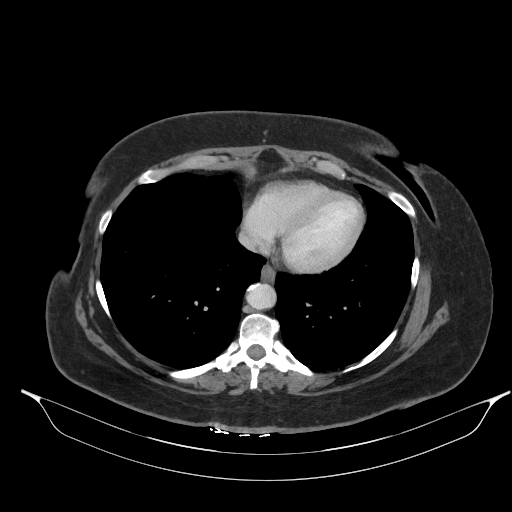

[Series 6: abd/pel cor w · coronal · 0.80mm/px · 3 of 135 slices shown]
[im 45/135  soft-tissue]
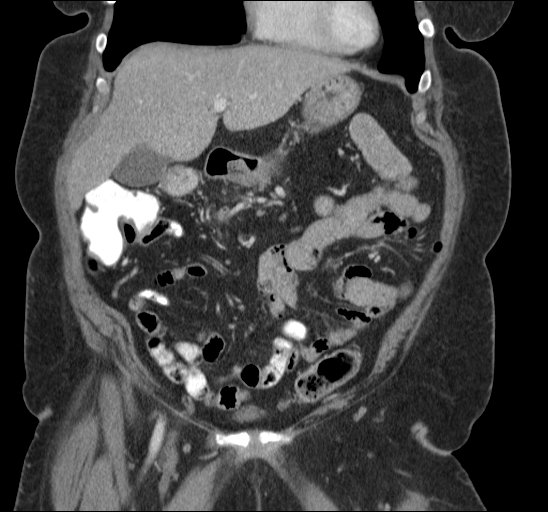
[im 60/135  soft-tissue]
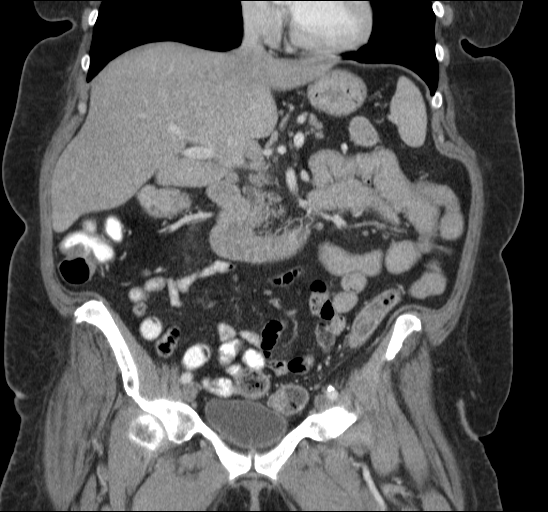
[im 75/135  soft-tissue]
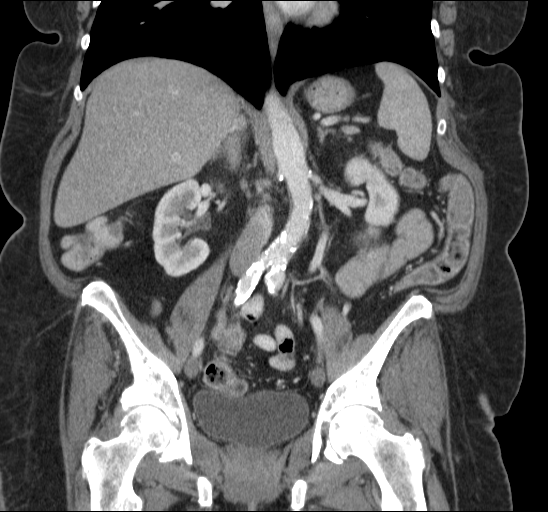

[15 of 46 positions shown; findings below may reference images not displayed]

FINDINGS: There is generalized dehiscence of the Aine Gilbertson between the xiphoid and 
umbilicus spanning a length of 7.7 cm superior-inferior and measuring 6.7 cm 
wide allowing for anterior herniation of the intra-abdominal contents including 
intraperitoneal fat and the mid transverse colon. There is a second smaller 
separate area of dehiscence more inferiorly through the RIGHT side of the Francid 
Bozza slightly above the umbilicus measuring 18 mm superior-inferior and 2.6 cm 
wide along for minimal anterior herniation of intraperitoneal fat only. The 
anterior abdominal wall is within normal limits at and inferior to the 
umbilicus. The inguinal areas are within normal limits. 
No hepatic abnormality is found. The gallbladder, biliary tract, pancreas and 
adrenal glands are within normal limits. The spleen is at the upper limits of 
normal in size. There is a incidental 1 cm hypodense area in the medial splenic 
parenchyma. Almost all incidentally discovered splenic lesions represent 
incidental benign pathology. No concerning renal pathology is found. Urinary 
bladder is within normal limits. There is minimal chronic diverticular disease 
involving the colon. No acute appearing colonic pathology is found. The small 
bowel loops are within normal limits. No gastric pathology is found. Portal 
vein, main trunk of the superior mesenteric vein and IVC are unremarkable. 
Moderate atherosclerotic changes are along the abdominal aorta and its branches. 
There is no aneurysmal dilatation. No abnormal abdominal or pelvic lymph nodes 
are found. The small bowel mesentery is normal. There is no ascites. Limited 
imaging of the inferior thorax shows no significant incidental findings. Mild 
levoscoliotic curvature is in the upper lumbar spine. Degenerative changes are 
incidentally noted in the spine.
IMPRESSION: Epigastric ventral hernia as described above. 
RADIATION DOSE REDUCTION: All CT scans are performed using radiation dose 
reduction techniques, when applicable.  Technical factors are evaluated and 
adjusted to ensure appropriate moderation of exposure.  Automated dose 
management technology is applied to adjust the radiation doses to minimize 
exposure while achieving diagnostic quality images.

## 2022-12-24 IMAGING — DX LUMBAR SPINE COMPLETE 4 VIEWS
1 series · 4 of 4 positions shown · non-contrast
Comparison: None

________________________________________________________________________________________________ 
LUMBAR SPINE COMPLETE 4 VIEWS, 12/24/2022 [DATE]: 
CLINICAL INDICATION:  Low back pain

[Series 1: AP · U · 0.14mm/px · 4 of 4 slices shown]
[im 1/4]
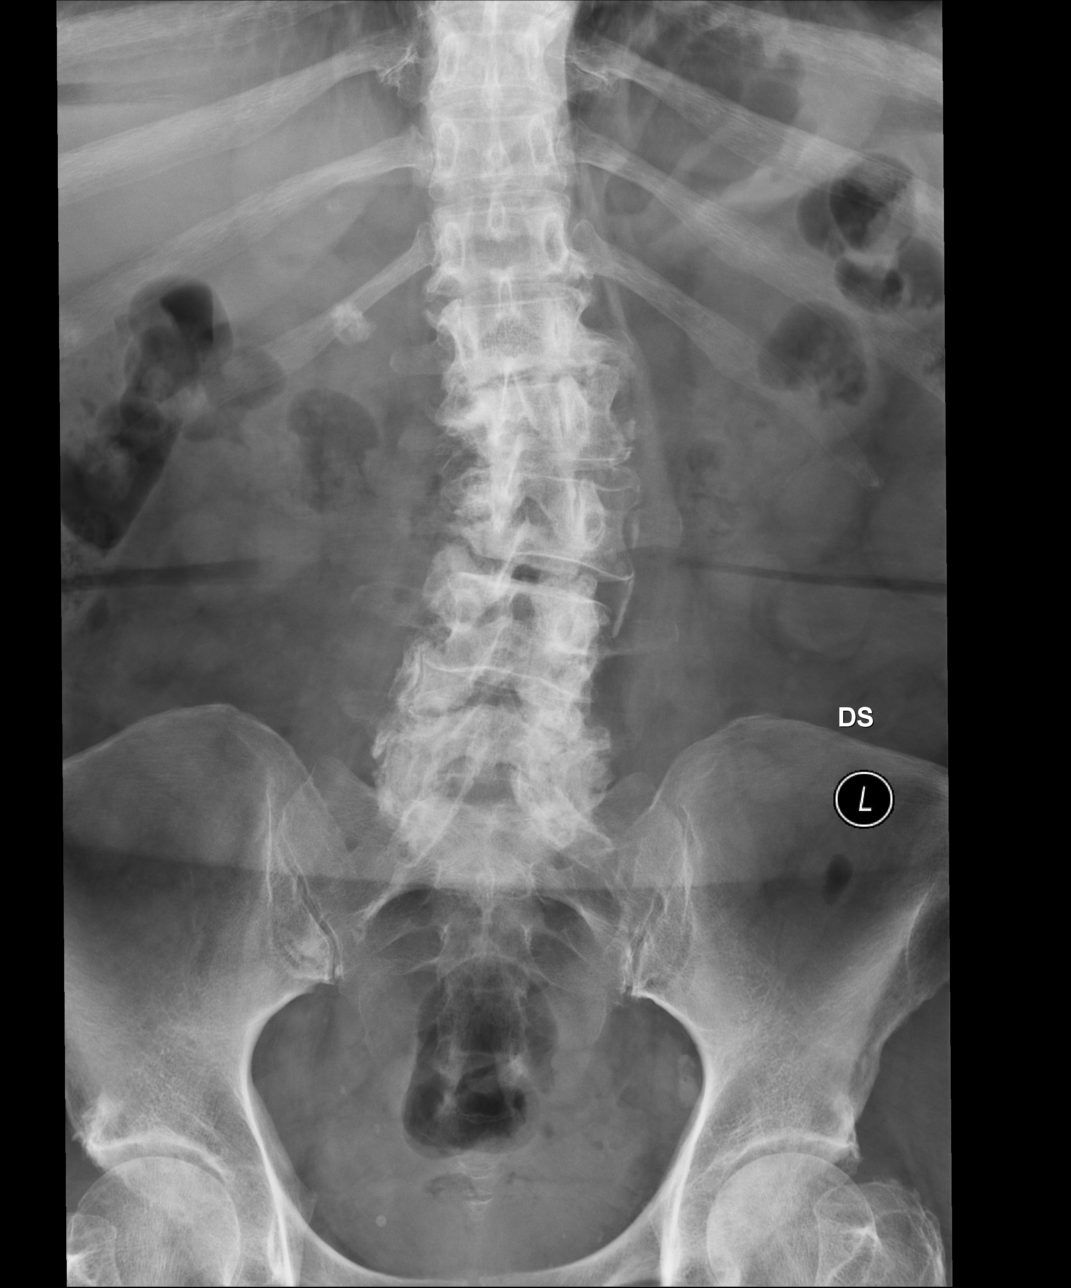
[im 2/4]
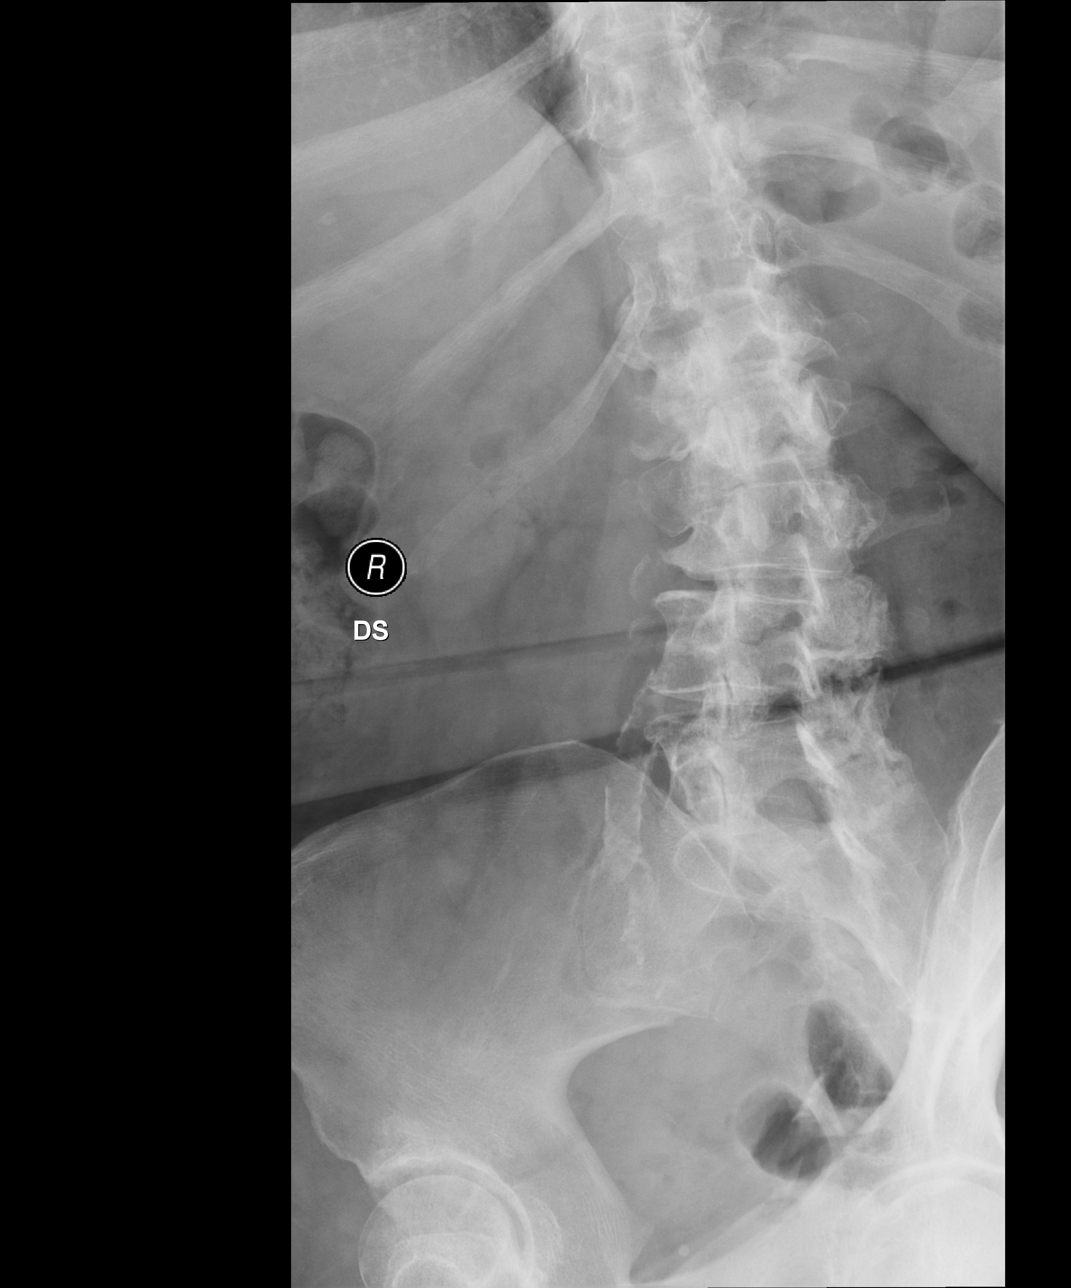
[im 3/4]
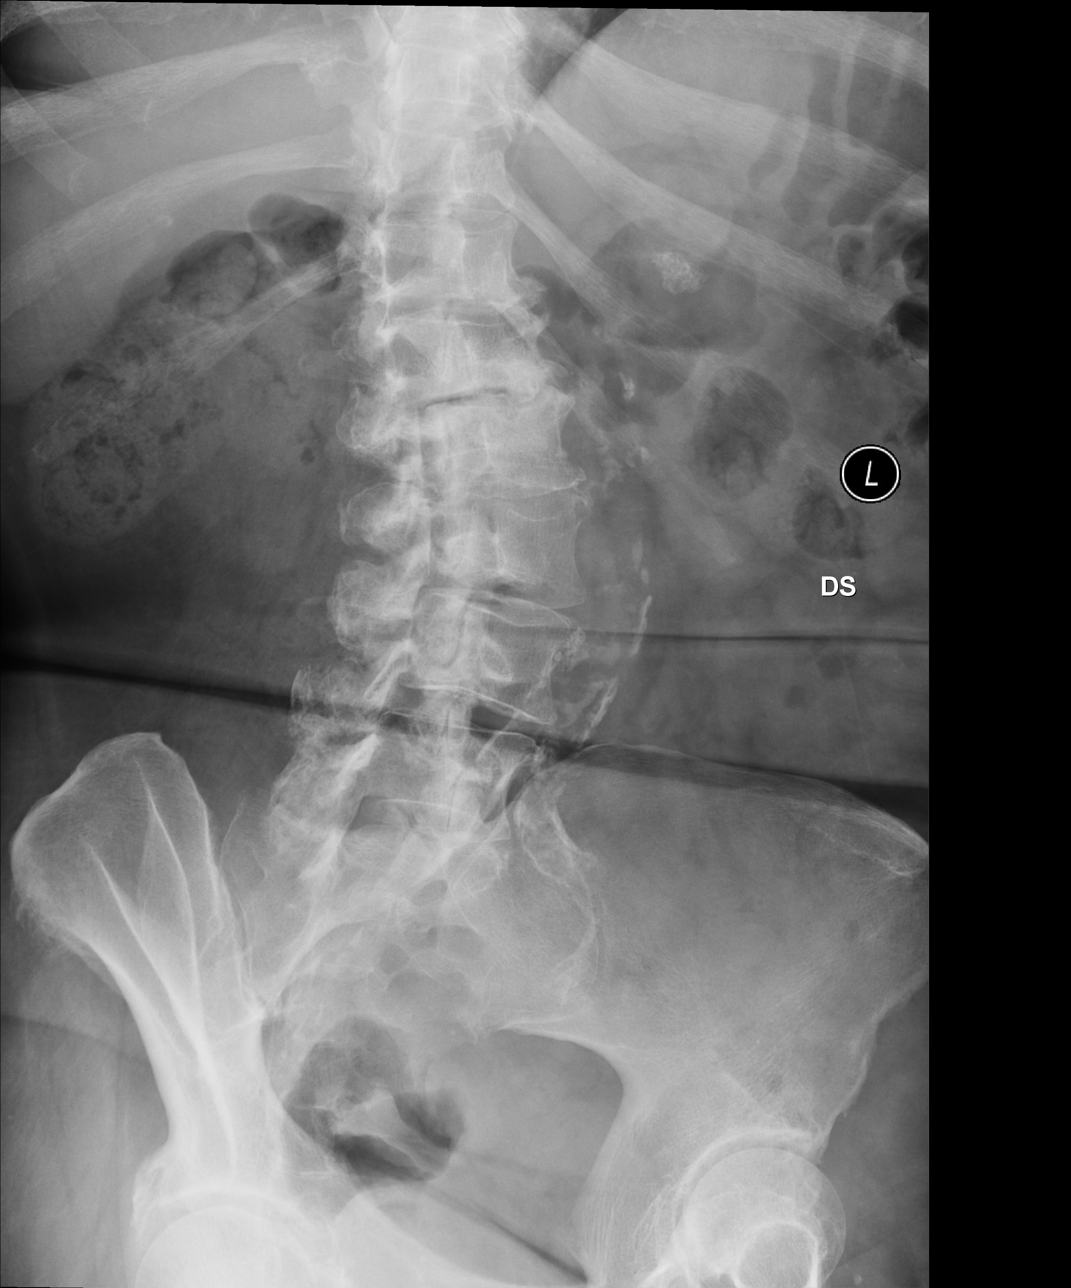
[im 4/4]
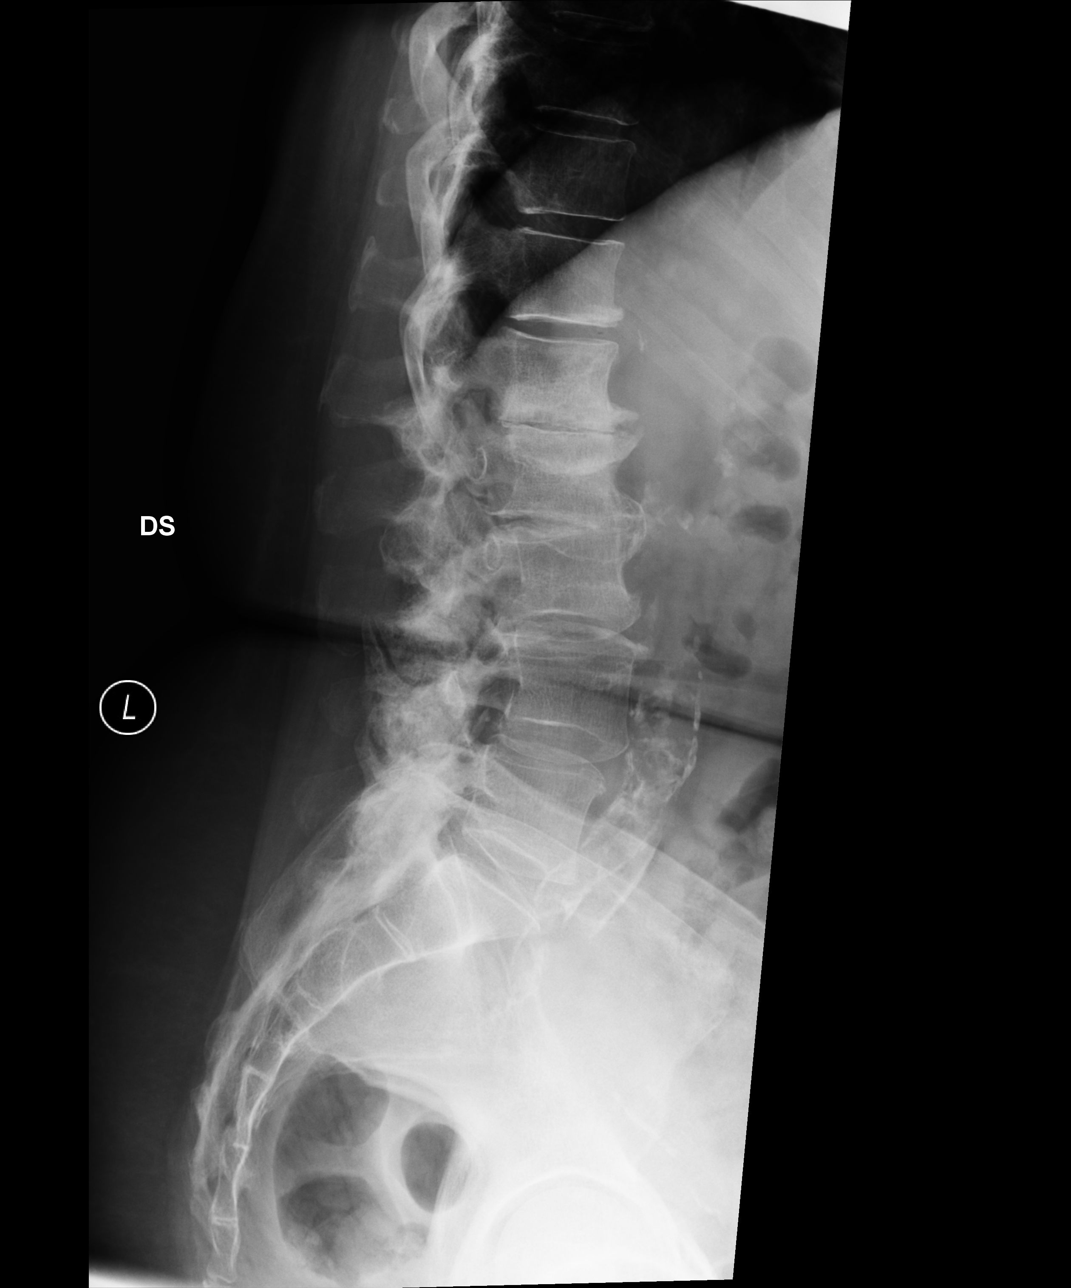

[4 of 4 positions shown; findings below may reference images not displayed]

FINDINGS: Lumbar vertebral heights are intact. There is 2-3 mm anterolisthesis 
at L4-5. There is mild upper lumbar levoscoliosis. Advanced facet degenerative 
changes at the lower 2 levels. Marked disc narrowing at L1-2 and L2-3. No 
spondylolysis.
IMPRESSION: Degenerative changes. Upper lumbar levoscoliosis. MRI would be useful if 
indicated. 
Calcification in the right upper quadrant.

## 2022-12-24 IMAGING — MR MRI LUMBAR SPINE WITHOUT CONTRAST
6 of 8 series · 15 of 48 positions shown · IV contrast (gadolinium)
Comparison: None

________________________________________________________________________________________________ 
MRI LUMBAR SPINE WITHOUT CONTRAST, 12/24/2022 [DATE]: 
CLINICAL INDICATION: Chronic low back pain
TECHNIQUE: Sagittal T1, Sagittal T2, Sagittal STIR, Axial T1 and Axial T2 MR 
images of the lumbar spine were performed without intravenous gadolinium 
enhancement.

[Series 101: survey · axial · 10.0mm · 1.39mm/px · 1 of 9 slices shown]
[im 1/9]
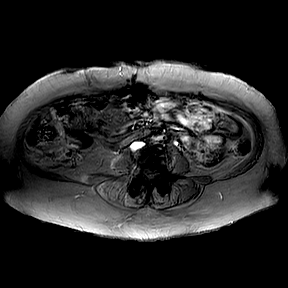

[Series 201: t2w_cor-surv · coronal · 6.0mm · 0.50mm/px · 1 of 5 slices shown]
[im 1/5]
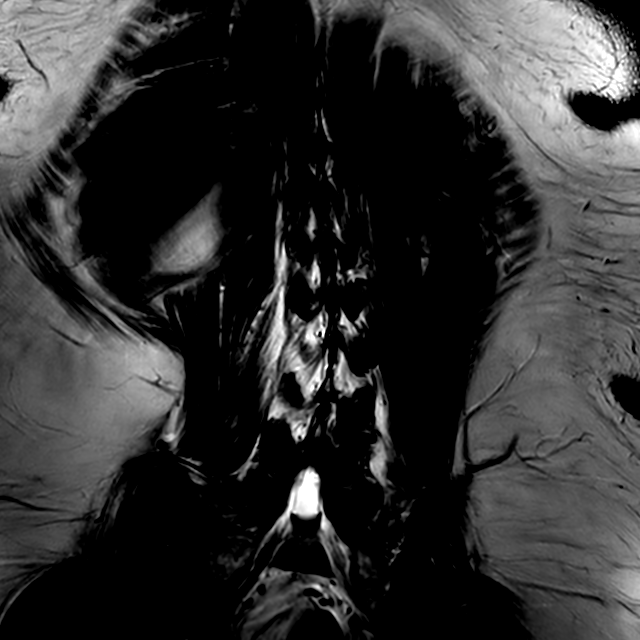

[Series 301: t1w_tse sag · sagittal · 4.0mm · 0.25mm/px · 2 of 17 slices shown]
[im 1/17]
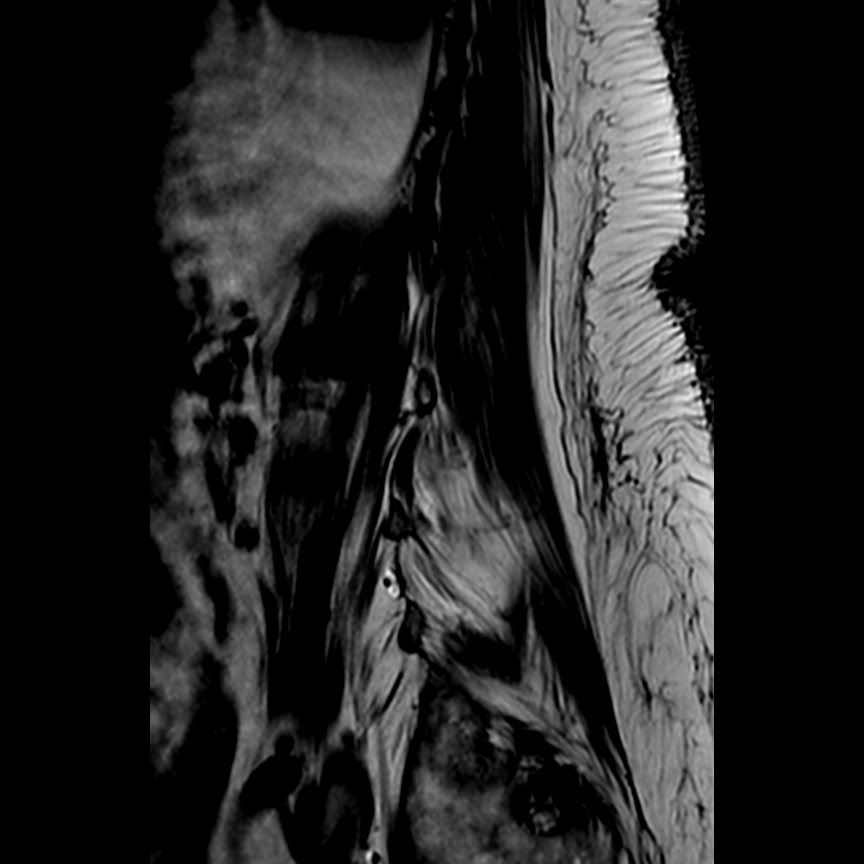
[im 17/17]
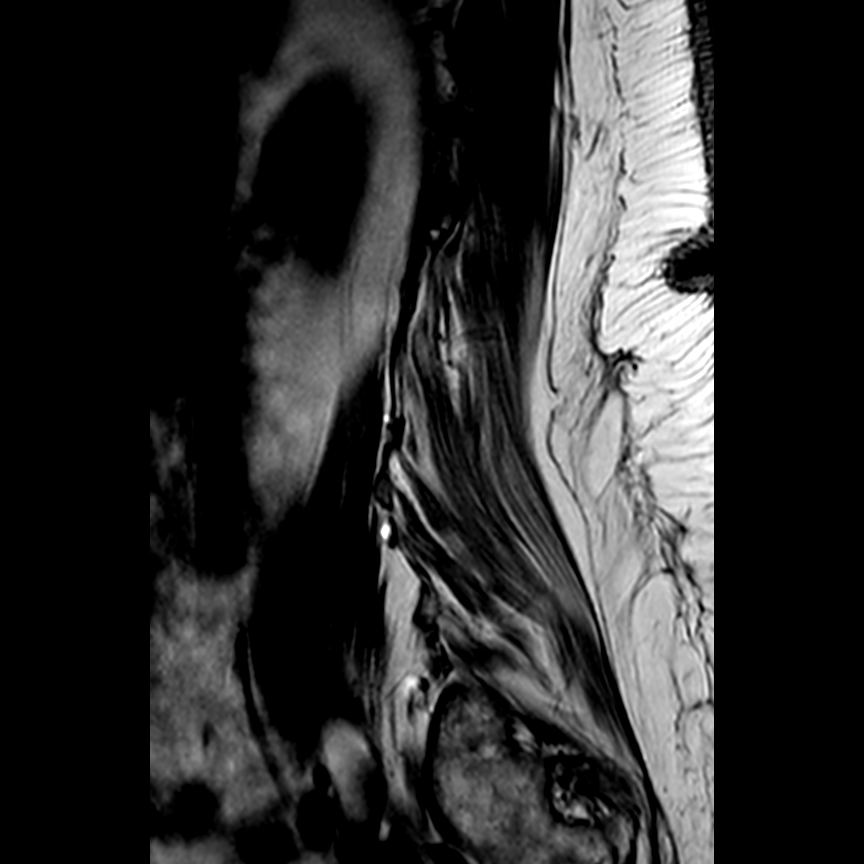

[Series 402: sst2 sag/fs · sagittal · 4.0mm · 0.39mm/px · 3 of 17 slices shown]
[im 1/17]
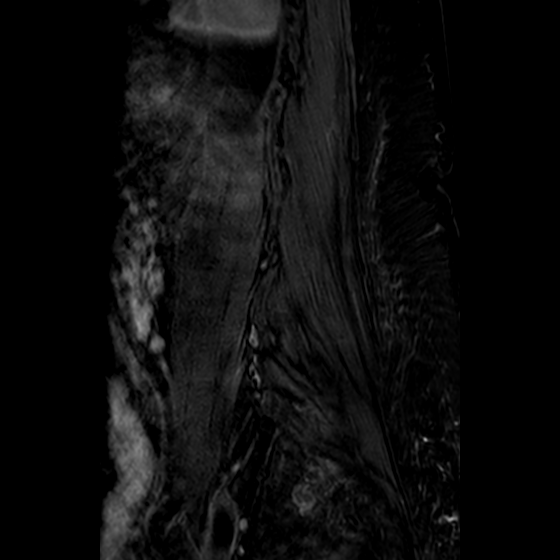
[im 9/17]
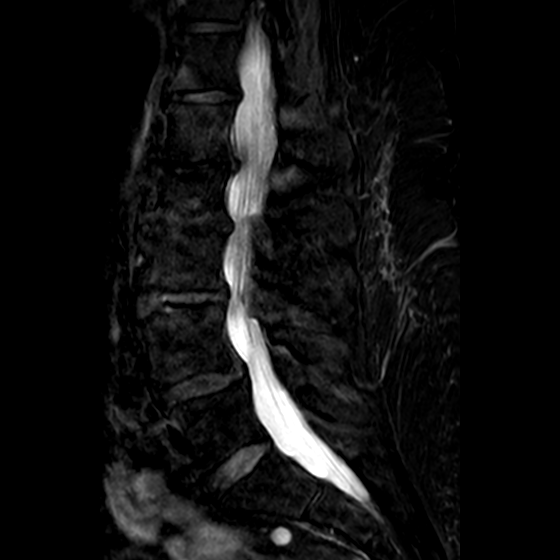
[im 17/17]
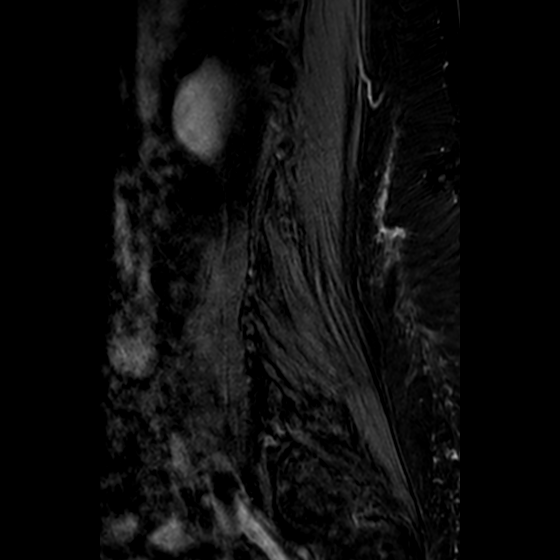

[Series 403: st2 sag · sagittal · 4.0mm · 0.39mm/px · 3 of 17 slices shown]
[im 1/17]
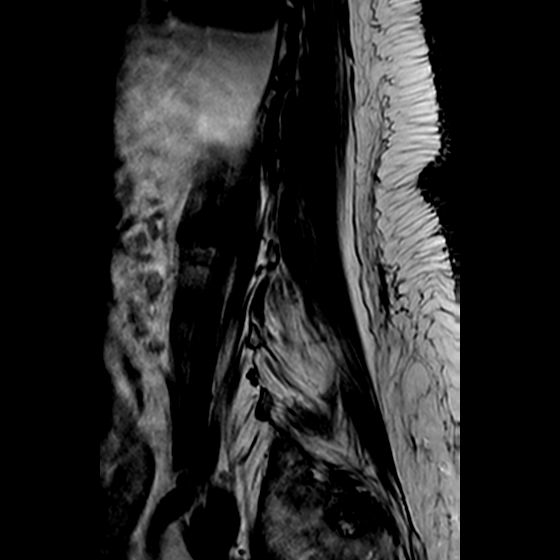
[im 9/17]
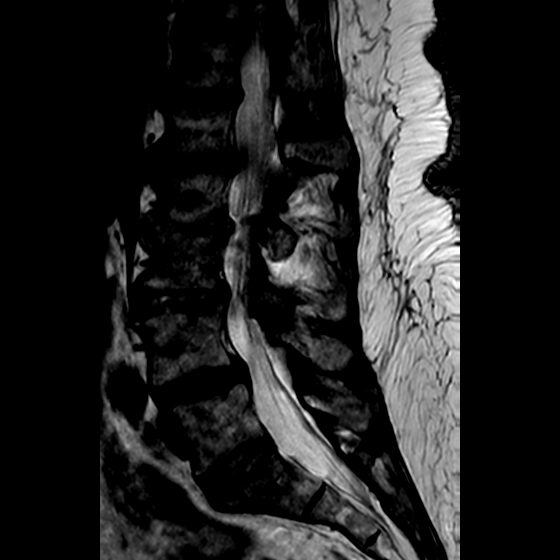
[im 17/17]
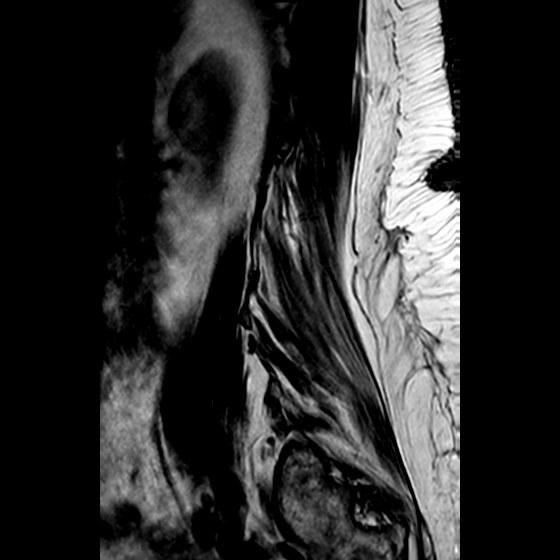

[Series 501: 3d_spine_view_t2w sag · sagittal · 1.5mm · 0.42mm/px · 5 of 107 slices shown]
[im 7/107]
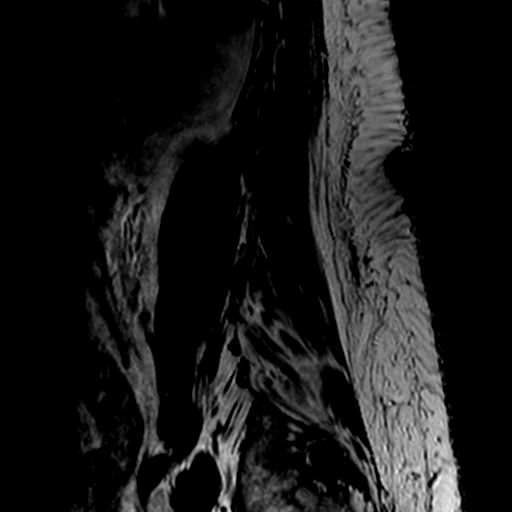
[im 20/107]
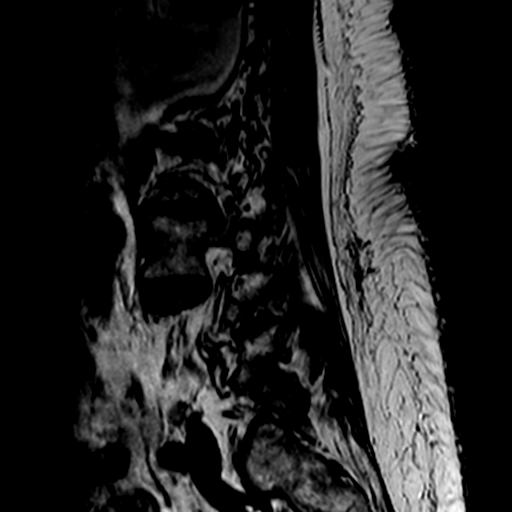
[im 34/107]
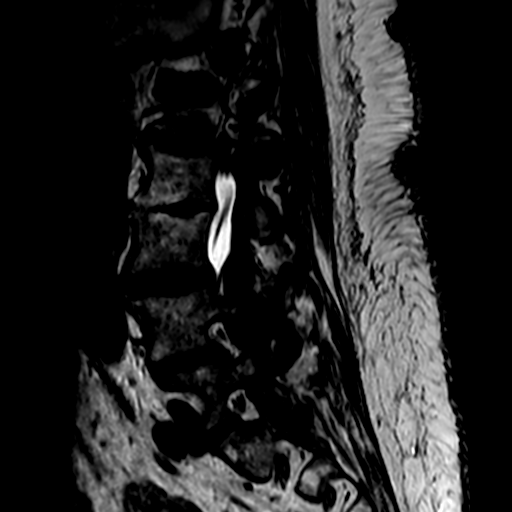
[im 47/107]
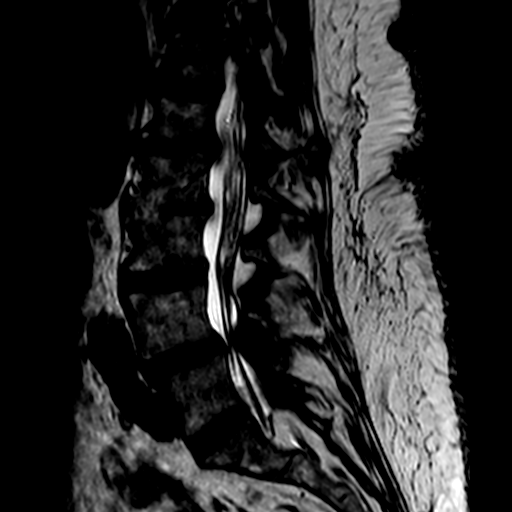
[im 60/107]
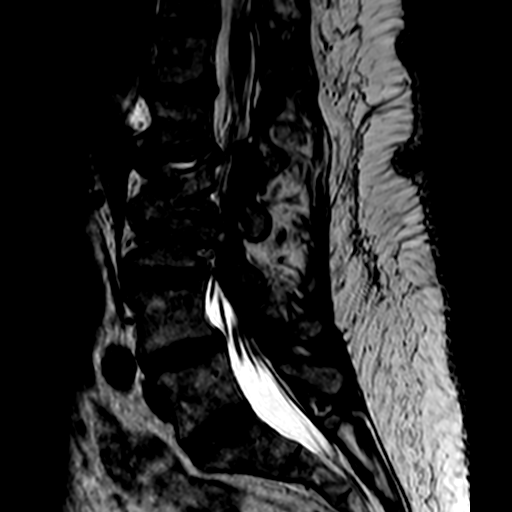

[15 of 48 positions shown; findings below may reference images not displayed]

FINDINGS: Motion artifact limits detail. 
Lumbar vertebral heights appear intact. There is 3 mm anterolisthesis at L4-5 
without pars defect. Marked disc narrowing at L2-3 and L1-2. There is mild to 
moderate upper lumbar levoscoliosis. 
At L5-S1 the canal and foramina are open. Marked facet hypertrophy. 
At L4-5 there is disc bulge and marked facet hypertrophy with ligamentous 
thickening. Mild canal stenosis. There is encroachment on the upper L5 lateral 
recesses bilaterally, best seen on axial T2 image 8. Foramina are open. 
At L3-4 there is borderline to mild canal stenosis. Marked facet hypertrophy 
with ligamentous thickening. Mild to moderate right foraminal stenosis 
encroaching on the distal right L3 nerve root. Left foramen open. 
At L2-3 the canal and foramina appear open. Moderate to marked facet change. 
At L1-2 the canal and foramina are open. Moderate to marked facet change. 
Modic type I changes from T12 through L2- 3. Degenerative reactive edema 
involving the L4-5 facets.
IMPRESSION: Degenerative changes, including less than grade 1 anterolisthesis at L4-5. At 
L4-5 there is encroachment on the upper L5 lateral recesses. 
Right L3-4 foraminal stenosis encroaching on the distal right L3 nerve root. 
Advanced lumbar facet degenerative changes. Reactive edema involving the L4-5 
facet joints. There is Modic type I endplate change from T12 through L2-3. 
Mild to moderate upper lumbar levoscoliosis. 
Todays study is limited by motion artifact.

## 2023-04-06 IMAGING — DX CERVICAL SPINE 4 VIEWS
1 series · 4 of 4 positions shown · non-contrast
Comparison: None

________________________________________________________________________________________________ 
CERVICAL SPINE 4 VIEWS, 04/06/2023 [DATE]: 
CLINICAL INDICATION: Cervicalgia , history of uterine cancer. Left-sided neck 
pain.

[Series 1: lateral · U · 0.14mm/px · 4 of 4 slices shown]
[im 1/4]
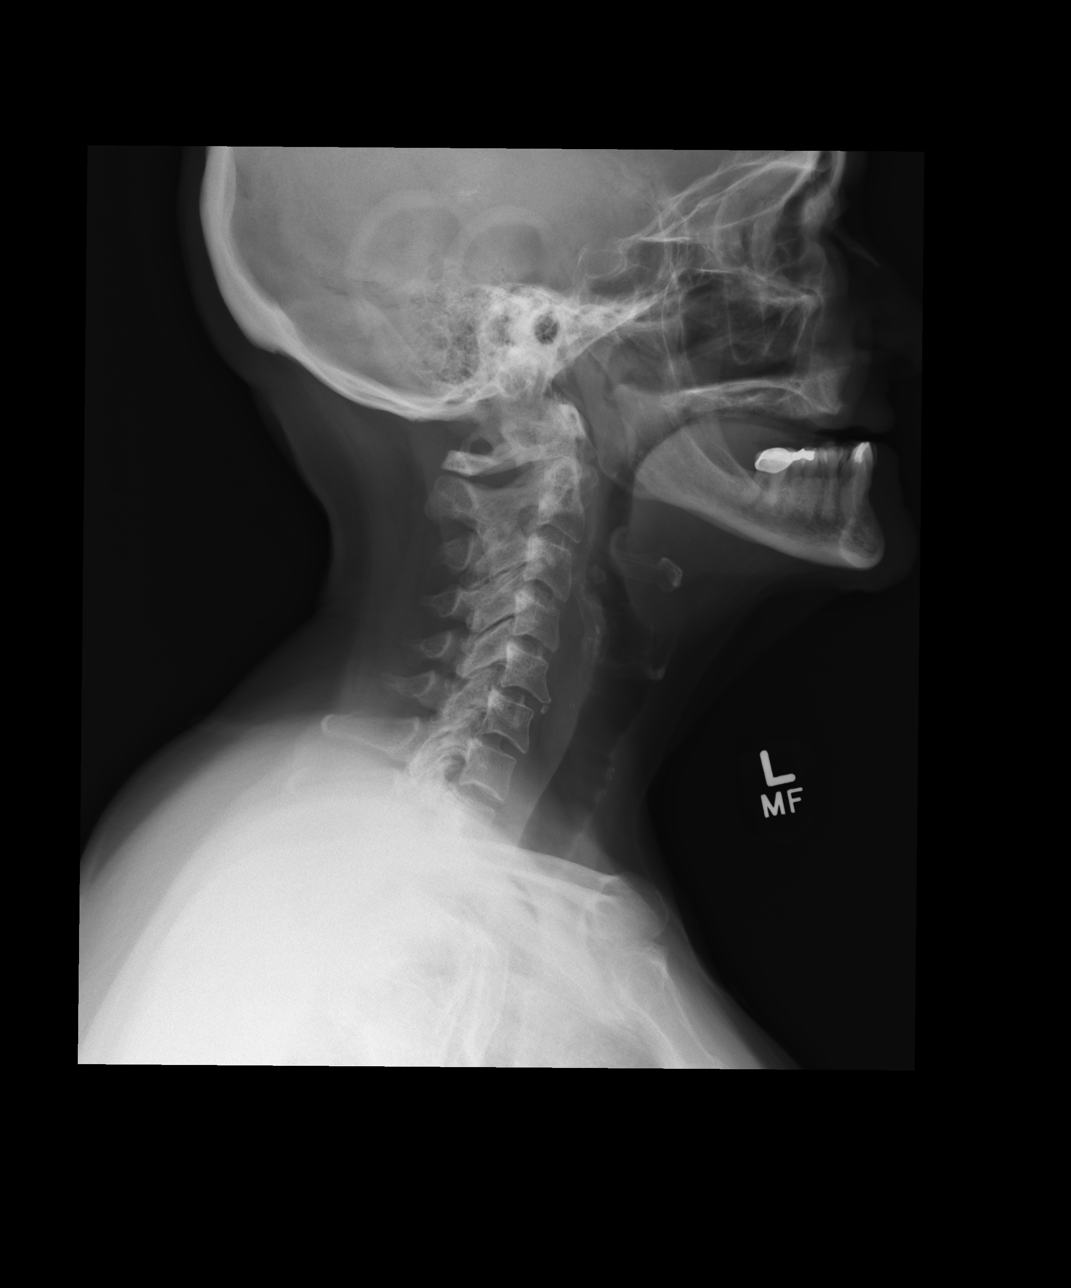
[im 2/4]
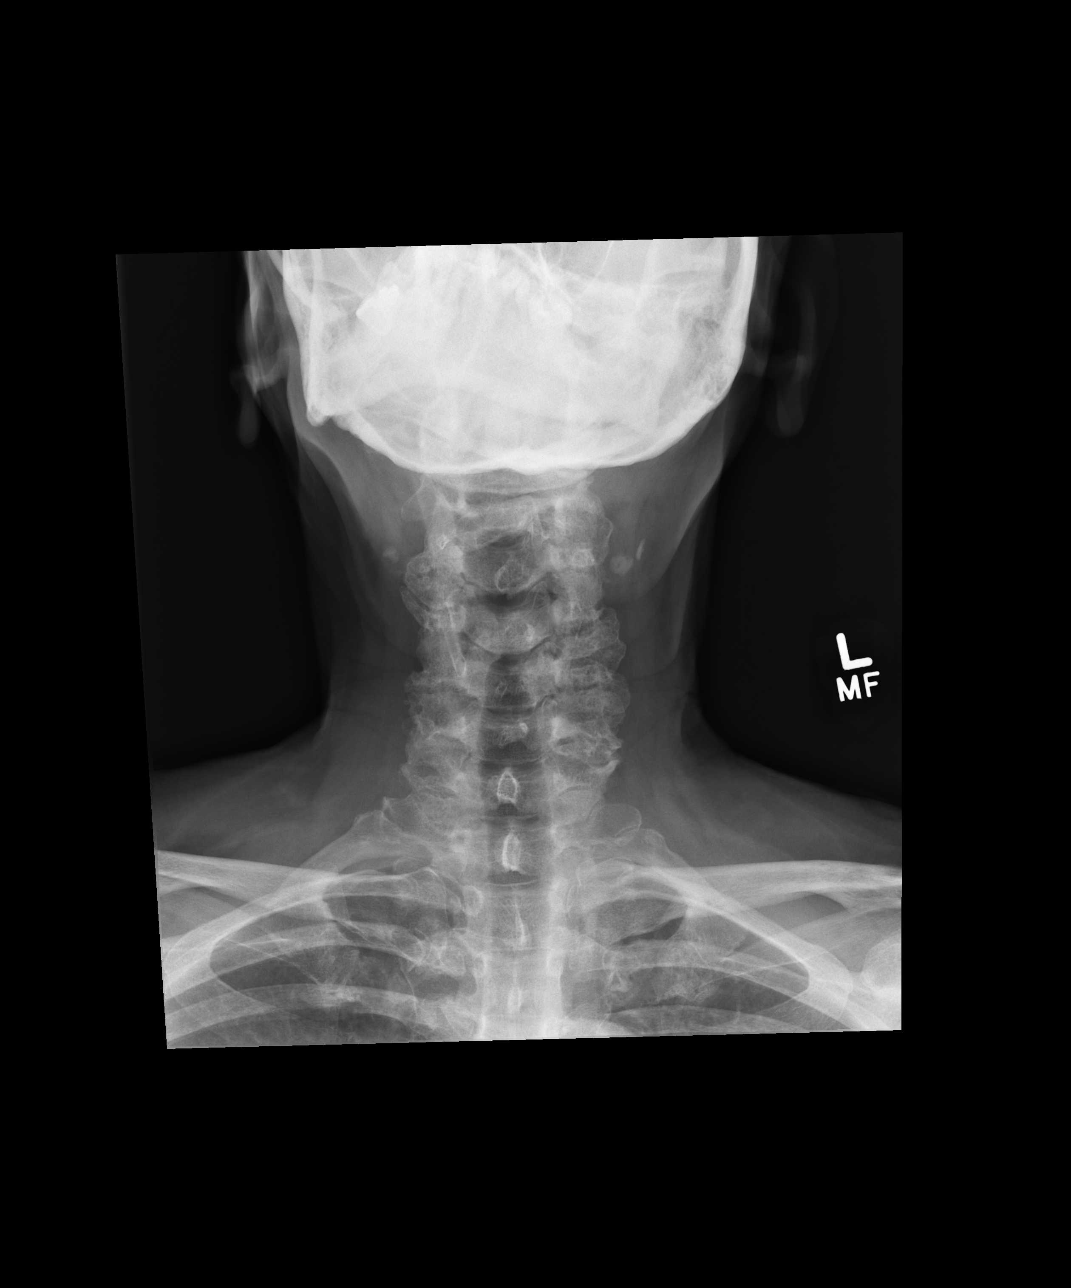
[im 3/4]
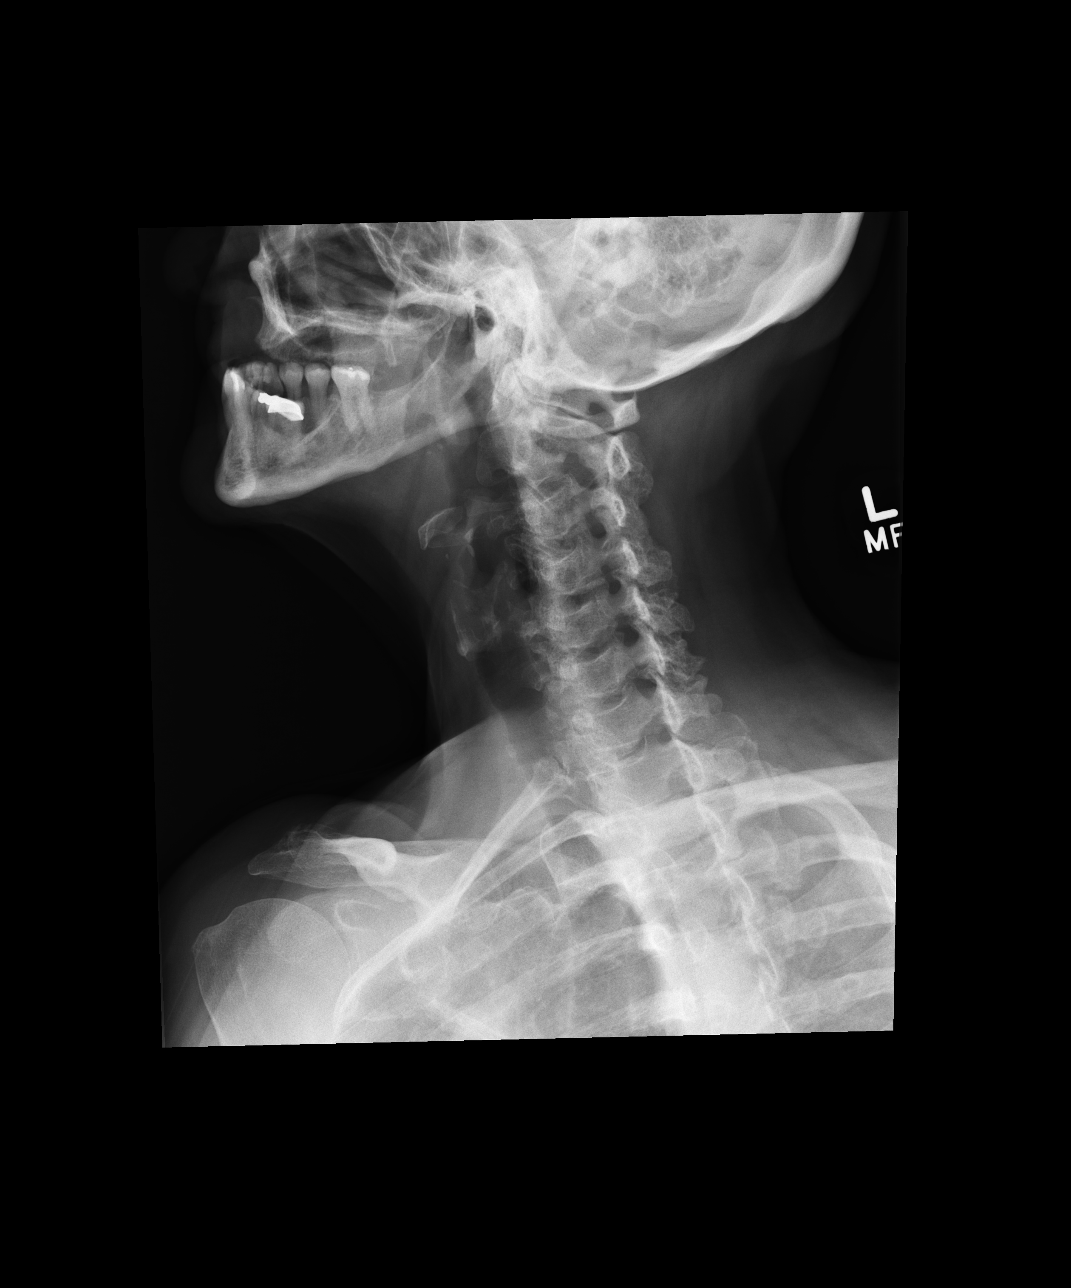
[im 4/4]
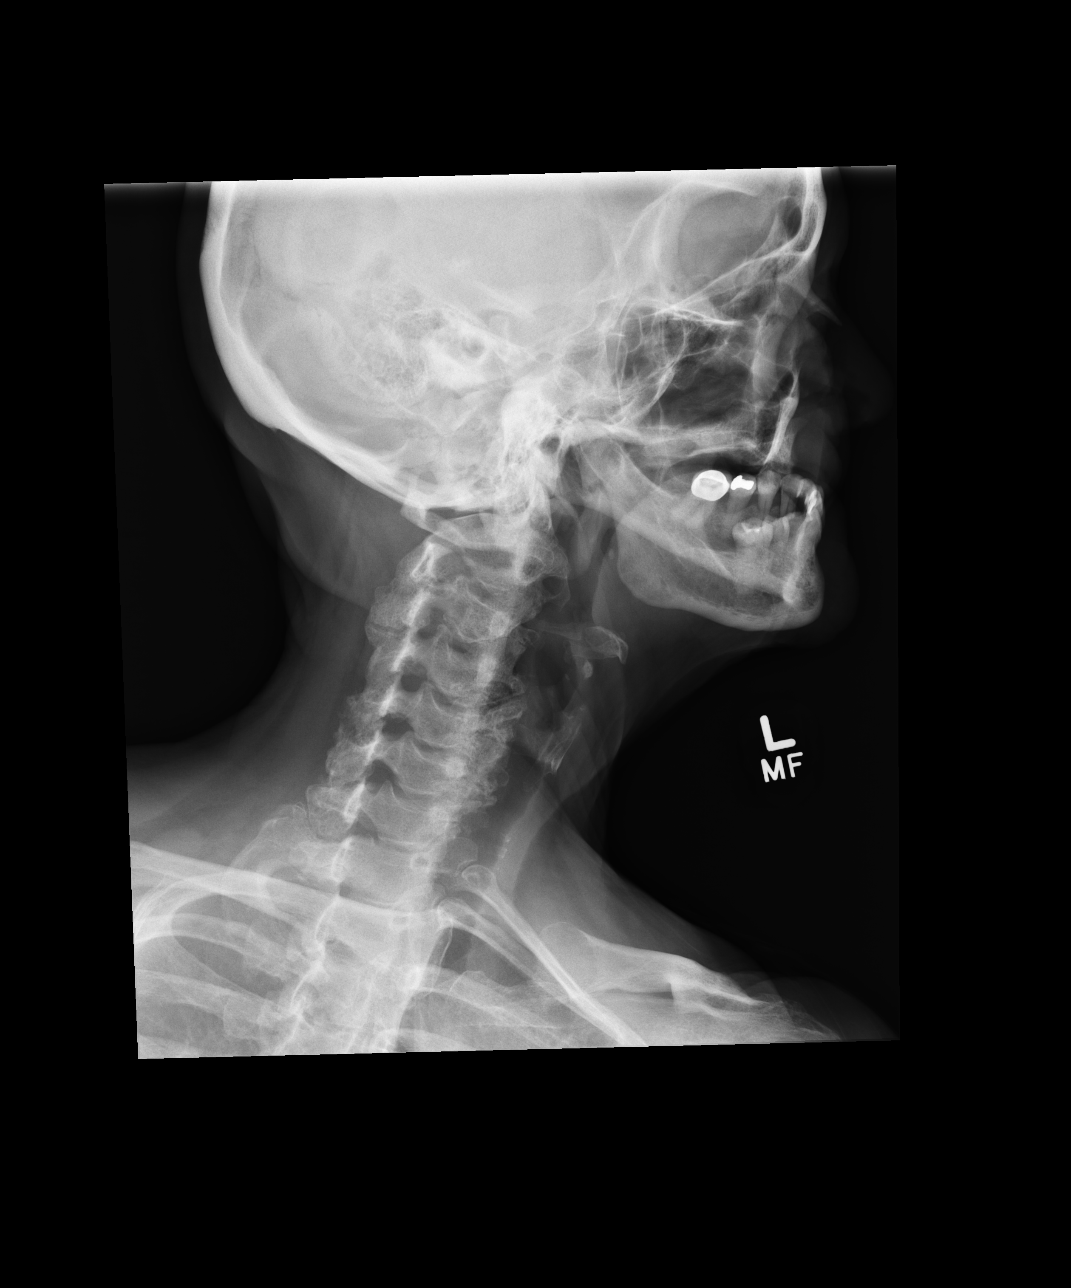

[4 of 4 positions shown; findings below may reference images not displayed]

FINDINGS: Normal coronal alignment. Grade 1 anterolisthesis C2 on C3 and C5 on 
C6. Vertebral body height preserved. No fracture. Disc height maintained. 
Multilevel articular pillar and lateral mass hypertrophic formation. Bilateral 
CCA bifurcation atherosclerotic changes. 
Bilateral C3-C4 foraminal stenosis appears mild. There is left-sided C4-C5 
foraminal stenosis which appears mild to moderate. 
Prevertebral soft tissues, airway, and lung apices are clear.
IMPRESSION: Cervical degenerative changes. Foraminal stenosis is suspected. Consider MRI to 
further assess.

## 2024-06-12 ENCOUNTER — Encounter

## 2024-07-16 ENCOUNTER — Inpatient Hospital Stay: Admit: 2024-07-16 | Payer: MEDICARE | Primary: Internal Medicine

## 2024-07-16 ENCOUNTER — Encounter

## 2024-08-13 ENCOUNTER — Encounter

## 2024-08-13 ENCOUNTER — Inpatient Hospital Stay: Admit: 2024-08-13 | Payer: MEDICARE | Primary: Internal Medicine

## 2024-08-13 DIAGNOSIS — M25561 Pain in right knee: Principal | ICD-10-CM

## 2024-09-14 ENCOUNTER — Encounter

## 2024-09-14 NOTE — Telephone Encounter (Signed)
"  Left message for patient to schedule colonoscopy with Dr. Ivor.    "

## 2024-09-14 NOTE — Telephone Encounter (Signed)
 Spoke with patient. Verified patient name and DOB.    Screening Questions:     Have you ever had a colonoscopy? yes - 2-3 years ago  Is there a family or personal history of colon cancer or polyps?  no  Are you having any gastrointestinal issues such as blood in stool, change in bowels (constipation/diarrhea), abdominal pain?  NO    Informed patient that a nurse will give them a call to review instructions.     Scheduled patient 2/25 with Dr. Ivor.

## 2024-09-20 NOTE — Progress Notes (Signed)
"  Identified patient with two patient identifiers (name and DOB). Reviewed chart in preparation for upcoming Colonoscopy.      Dana [name] is a 69 y.o. female    All medications, allergies, prep information and procedure date reviewed with patient.       Suprep Bowel prep pended to provider.     Patient takes Mournjaro and is aware to stop this medication 7 days prior to procedure.         "

## 2024-09-21 MED ORDER — NA SULFATE-K SULFATE-MG SULF 17.5-3.13-1.6 GM/177ML PO SOLN
17.5-3.13-1.6 | PACK | ORAL | 0 refills | 30.00000 days | Status: AC
Start: 2024-09-21 — End: ?

## 2024-10-31 ENCOUNTER — Inpatient Hospital Stay: Payer: MEDICARE
